# Patient Record
Sex: Male | Born: 1975
Health system: Southern US, Community
[De-identification: ages and names within clinical notes are randomized; demographics above are authoritative.]

## PROBLEM LIST (undated history)

## (undated) DIAGNOSIS — R197 Diarrhea, unspecified: Secondary | ICD-10-CM

## (undated) DIAGNOSIS — L0390 Cellulitis, unspecified: Secondary | ICD-10-CM

## (undated) DIAGNOSIS — C801 Malignant (primary) neoplasm, unspecified: Secondary | ICD-10-CM

## (undated) DIAGNOSIS — F329 Major depressive disorder, single episode, unspecified: Secondary | ICD-10-CM

## (undated) DIAGNOSIS — I10 Essential (primary) hypertension: Secondary | ICD-10-CM

## (undated) DIAGNOSIS — Z9852 Vasectomy status: Secondary | ICD-10-CM

## (undated) DIAGNOSIS — F4310 Post-traumatic stress disorder, unspecified: Secondary | ICD-10-CM

## (undated) DIAGNOSIS — Z85528 Personal history of other malignant neoplasm of kidney: Secondary | ICD-10-CM

## (undated) DIAGNOSIS — G8929 Other chronic pain: Secondary | ICD-10-CM

## (undated) DIAGNOSIS — B019 Varicella without complication: Secondary | ICD-10-CM

## (undated) DIAGNOSIS — C649 Malignant neoplasm of unspecified kidney, except renal pelvis: Secondary | ICD-10-CM

## (undated) DIAGNOSIS — F419 Anxiety disorder, unspecified: Secondary | ICD-10-CM

## (undated) DIAGNOSIS — J45909 Unspecified asthma, uncomplicated: Secondary | ICD-10-CM

## (undated) DIAGNOSIS — M1990 Unspecified osteoarthritis, unspecified site: Secondary | ICD-10-CM

## (undated) HISTORY — PX: WRIST SURGERY: SHX841

## (undated) HISTORY — DX: Varicella without complication: B01.9

## (undated) HISTORY — DX: Malignant neoplasm of unspecified kidney, except renal pelvis: C64.9

## (undated) HISTORY — DX: Unspecified asthma, uncomplicated: J45.909

## (undated) HISTORY — DX: Major depressive disorder, single episode, unspecified: F32.9

## (undated) HISTORY — PX: WISDOM TOOTH EXTRACTION: SHX21

## (undated) HISTORY — DX: Diarrhea, unspecified: R19.7

## (undated) HISTORY — PX: ABLATION: SHX5711

## (undated) HISTORY — DX: Vasectomy status: Z98.52

## (undated) HISTORY — DX: Anxiety disorder, unspecified: F41.9

## (undated) HISTORY — DX: Unspecified osteoarthritis, unspecified site: M19.90

---

## 2010-10-21 HISTORY — PX: VASECTOMY: SHX75

## 2012-10-16 ENCOUNTER — Encounter (HOSPITAL_BASED_OUTPATIENT_CLINIC_OR_DEPARTMENT_OTHER): Payer: Self-pay

## 2012-10-16 DIAGNOSIS — A4901 Methicillin susceptible Staphylococcus aureus infection, unspecified site: Secondary | ICD-10-CM | POA: Diagnosis present

## 2012-10-16 DIAGNOSIS — L02219 Cutaneous abscess of trunk, unspecified: Secondary | ICD-10-CM | POA: Diagnosis present

## 2012-10-16 DIAGNOSIS — L02419 Cutaneous abscess of limb, unspecified: Principal | ICD-10-CM | POA: Diagnosis present

## 2012-10-16 DIAGNOSIS — F17200 Nicotine dependence, unspecified, uncomplicated: Secondary | ICD-10-CM | POA: Diagnosis present

## 2012-10-16 DIAGNOSIS — Z23 Encounter for immunization: Secondary | ICD-10-CM

## 2012-10-16 DIAGNOSIS — Z792 Long term (current) use of antibiotics: Secondary | ICD-10-CM

## 2012-10-16 NOTE — ED Notes (Signed)
Left thigh abscess to left thigh with I&D 12/25-now c/o area to left groin that has enlarged

## 2012-10-17 ENCOUNTER — Encounter (HOSPITAL_COMMUNITY): Payer: Self-pay | Admitting: Family Medicine

## 2012-10-17 ENCOUNTER — Inpatient Hospital Stay (HOSPITAL_BASED_OUTPATIENT_CLINIC_OR_DEPARTMENT_OTHER)
Admission: EM | Admit: 2012-10-17 | Discharge: 2012-10-20 | DRG: 603 | Disposition: A | Discharge status: Home / Self Care | Payer: Medicaid Other | Attending: Family Medicine | Admitting: Family Medicine

## 2012-10-17 ENCOUNTER — Emergency Department (HOSPITAL_BASED_OUTPATIENT_CLINIC_OR_DEPARTMENT_OTHER): Payer: Medicaid Other

## 2012-10-17 DIAGNOSIS — L03319 Cellulitis of trunk, unspecified: Secondary | ICD-10-CM

## 2012-10-17 DIAGNOSIS — L0390 Cellulitis, unspecified: Secondary | ICD-10-CM

## 2012-10-17 DIAGNOSIS — L03116 Cellulitis of left lower limb: Secondary | ICD-10-CM

## 2012-10-17 DIAGNOSIS — L02219 Cutaneous abscess of trunk, unspecified: Secondary | ICD-10-CM

## 2012-10-17 DIAGNOSIS — L03119 Cellulitis of unspecified part of limb: Principal | ICD-10-CM

## 2012-10-17 DIAGNOSIS — L02419 Cutaneous abscess of limb, unspecified: Secondary | ICD-10-CM

## 2012-10-17 DIAGNOSIS — L03311 Cellulitis of abdominal wall: Secondary | ICD-10-CM

## 2012-10-17 LAB — CBC WITH DIFFERENTIAL/PLATELET
Basophils Absolute: 0 10*3/uL (ref 0.0–0.1)
Eosinophils Relative: 6 % — ABNORMAL HIGH (ref 0–5)
Lymphocytes Relative: 16 % (ref 12–46)
MCV: 93.3 fL (ref 78.0–100.0)
Platelets: 222 10*3/uL (ref 150–400)
RDW: 12.2 % (ref 11.5–15.5)
WBC: 9.6 10*3/uL (ref 4.0–10.5)

## 2012-10-17 LAB — BASIC METABOLIC PANEL
CO2: 29 mEq/L (ref 19–32)
Calcium: 9.7 mg/dL (ref 8.4–10.5)
GFR calc non Af Amer: 84 mL/min — ABNORMAL LOW (ref >90)
Sodium: 140 mEq/L (ref 135–145)

## 2012-10-17 MED ORDER — NICOTINE 14 MG/24HR TD PT24
14.0000 mg | MEDICATED_PATCH | Freq: Every day | TRANSDERMAL | Status: DC
  Administered 2012-10-17 – 2012-10-20 (×4): 14 mg via TRANSDERMAL
  Filled 2012-10-17 (×4): qty 1

## 2012-10-17 MED ORDER — SODIUM CHLORIDE 0.9 % IV SOLN
Freq: Once | INTRAVENOUS | Status: AC
  Administered 2012-10-17: 02:00:00 via INTRAVENOUS

## 2012-10-17 MED ORDER — VANCOMYCIN HCL 10 G IV SOLR
1250.0000 mg | Freq: Two times a day (BID) | INTRAVENOUS | Status: DC
  Administered 2012-10-17 (×2): 1250 mg via INTRAVENOUS
  Filled 2012-10-17 (×4): qty 1250

## 2012-10-17 MED ORDER — SODIUM CHLORIDE 0.9 % IV BOLUS (SEPSIS)
1000.0000 mL | Freq: Once | INTRAVENOUS | Status: AC
  Administered 2012-10-17: 1000 mL via INTRAVENOUS

## 2012-10-17 MED ORDER — KETOROLAC TROMETHAMINE 30 MG/ML IJ SOLN
30.0000 mg | Freq: Once | INTRAMUSCULAR | Status: AC
  Administered 2012-10-17: 30 mg via INTRAVENOUS
  Filled 2012-10-17: qty 1

## 2012-10-17 MED ORDER — ONDANSETRON HCL 4 MG PO TABS: 4.0000 mg | ORAL_TABLET | Freq: Four times a day (QID) | ORAL | Status: DC | PRN

## 2012-10-17 MED ORDER — MORPHINE SULFATE 4 MG/ML IJ SOLN
4.0000 mg | INTRAMUSCULAR | Status: DC | PRN
  Administered 2012-10-17 – 2012-10-20 (×16): 4 mg via INTRAVENOUS
  Filled 2012-10-17 (×16): qty 1

## 2012-10-17 MED ORDER — SODIUM CHLORIDE 0.9 % IV SOLN
3.0000 g | Freq: Four times a day (QID) | INTRAVENOUS | Status: DC
  Administered 2012-10-17 – 2012-10-20 (×12): 3 g via INTRAVENOUS
  Filled 2012-10-17 (×15): qty 3

## 2012-10-17 MED ORDER — DOCUSATE SODIUM 100 MG PO CAPS
100.0000 mg | ORAL_CAPSULE | Freq: Two times a day (BID) | ORAL | Status: DC
  Administered 2012-10-17 – 2012-10-20 (×7): 100 mg via ORAL
  Filled 2012-10-17 (×7): qty 1

## 2012-10-17 MED ORDER — ONDANSETRON HCL 4 MG/2ML IJ SOLN: 4.0000 mg | Freq: Four times a day (QID) | INTRAMUSCULAR | Status: DC | PRN

## 2012-10-17 MED ORDER — FENTANYL CITRATE 0.05 MG/ML IJ SOLN
50.0000 ug | Freq: Once | INTRAMUSCULAR | Status: AC
  Administered 2012-10-17: 50 ug via INTRAVENOUS
  Filled 2012-10-17: qty 2

## 2012-10-17 MED ORDER — PIPERACILLIN-TAZOBACTAM 3.375 G IVPB 30 MIN
3.3750 g | Freq: Once | INTRAVENOUS | Status: AC
  Administered 2012-10-17: 3.375 g via INTRAVENOUS
  Filled 2012-10-17 (×2): qty 50

## 2012-10-17 MED ORDER — ALUM & MAG HYDROXIDE-SIMETH 200-200-20 MG/5ML PO SUSP
30.0000 mL | Freq: Four times a day (QID) | ORAL | Status: DC | PRN
  Administered 2012-10-17 – 2012-10-19 (×3): 30 mL via ORAL
  Filled 2012-10-17 (×3): qty 30

## 2012-10-17 MED ORDER — ACETAMINOPHEN 650 MG RE SUPP: 650.0000 mg | Freq: Four times a day (QID) | RECTAL | Status: DC | PRN

## 2012-10-17 MED ORDER — POLYETHYLENE GLYCOL 3350 17 G PO PACK
17.0000 g | PACK | Freq: Every day | ORAL | Status: DC | PRN
  Administered 2012-10-20: 17 g via ORAL
  Filled 2012-10-17: qty 1

## 2012-10-17 MED ORDER — HYDROCODONE-ACETAMINOPHEN 5-325 MG PO TABS
1.0000 | ORAL_TABLET | ORAL | Status: DC | PRN
Start: 2012-10-17 — End: 2012-10-19
  Administered 2012-10-17 – 2012-10-19 (×10): 2 via ORAL
  Filled 2012-10-17 (×10): qty 2

## 2012-10-17 MED ORDER — ACETAMINOPHEN 325 MG PO TABS
650.0000 mg | ORAL_TABLET | Freq: Four times a day (QID) | ORAL | Status: DC | PRN
Start: 2012-10-17 — End: 2012-10-17

## 2012-10-17 MED ORDER — VANCOMYCIN HCL IN DEXTROSE 1-5 GM/200ML-% IV SOLN
1000.0000 mg | Freq: Once | INTRAVENOUS | Status: AC
  Administered 2012-10-17: 1000 mg via INTRAVENOUS
  Filled 2012-10-17: qty 200

## 2012-10-17 MED ORDER — SODIUM CHLORIDE 0.9 % IV SOLN
INTRAVENOUS | Status: DC
  Administered 2012-10-17 – 2012-10-18 (×2): via INTRAVENOUS
  Administered 2012-10-19: 1000 mL via INTRAVENOUS

## 2012-10-17 MED ORDER — ZOLPIDEM TARTRATE 5 MG PO TABS: 5.0000 mg | ORAL_TABLET | Freq: Every evening | ORAL | Status: DC | PRN

## 2012-10-17 MED ORDER — ACETAMINOPHEN 325 MG PO TABS: 650.0000 mg | ORAL_TABLET | Freq: Four times a day (QID) | ORAL | Status: DC | PRN

## 2012-10-17 MED ORDER — IOHEXOL 300 MG/ML  SOLN
80.0000 mL | Freq: Once | INTRAMUSCULAR | Status: AC | PRN
  Administered 2012-10-17: 80 mL via INTRAVENOUS

## 2012-10-17 MED ORDER — MORPHINE SULFATE 4 MG/ML IJ SOLN
4.0000 mg | Freq: Once | INTRAMUSCULAR | Status: AC
  Administered 2012-10-17: 4 mg via INTRAVENOUS
  Filled 2012-10-17: qty 1

## 2012-10-17 NOTE — ED Notes (Signed)
Patient requesting something else for pain, reports pain 8/10. Will notify MD.

## 2012-10-17 NOTE — Consult Note (Signed)
Reason for Consult: L thigh and suprapubic abscess Referring Physician: [facility]  [name] is an 36 y.o. male.  HPI: Pt is a 36 y/o M that about a week ago was seen at an OSH for L thigh abscess.  This was I&D with no pus expressed.  He was started on bactrim and cephalexin.  The area con't to be painful and an area in his supra pubic area began to enlarge with cellulitis.  He came to the ED for further eval.  He was started on IV abx, and CT of his pelvis and L thigh reveal cellulitis with no signs of abscess collection.  History reviewed. No pertinent past medical history.  Past Surgical History  Procedure Date  . Wrist surgery   . Vasectomy     Family History  Problem Relation Age of Onset  . Cirrhosis Father     Social History:  reports that he has been smoking.  He does not have any smokeless tobacco history on file. He reports that he does not drink alcohol or use illicit drugs.  Allergies:  Allergies  Allergen Reactions  . Bee Pollen Swelling    Medications: I have reviewed the patient's current medications.  Results for orders placed during the hospital encounter of 10/17/12 (from the past 48 hour(s))  CBC WITH DIFFERENTIAL     Status: Abnormal   Collection Time   10/17/12  1:30 AM      Component Value Range Comment   WBC 9.6  4.0 - 10.5 K/uL    RBC 4.49  4.22 - 5.81 MIL/uL    Hemoglobin 14.7  13.0 - 17.0 g/dL    HCT 40.9  81.1 - 91.4 %    MCV 93.3  78.0 - 100.0 fL    MCH 32.7  26.0 - 34.0 pg    MCHC 35.1  30.0 - 36.0 g/dL    RDW 78.2  95.6 - 21.3 %    Platelets 222  150 - 400 K/uL    Neutrophils Relative 70  43 - 77 %    Neutro Abs 6.7  1.7 - 7.7 K/uL    Lymphocytes Relative 16  12 - 46 %    Lymphs Abs 1.5  0.7 - 4.0 K/uL    Monocytes Relative 9  3 - 12 %    Monocytes Absolute 0.8  0.1 - 1.0 K/uL    Eosinophils Relative 6 (*) 0 - 5 %    Eosinophils Absolute 0.5  0.0 - 0.7 K/uL    Basophils Relative 0  0 - 1 %    Basophils Absolute 0.0  0.0 -  0.1 K/uL   BASIC METABOLIC PANEL     Status: Abnormal   Collection Time   10/17/12  1:30 AM      Component Value Range Comment   Sodium 140  135 - 145 mEq/L    Potassium 4.5  3.5 - 5.1 mEq/L    Chloride 101  96 - 112 mEq/L    CO2 29  19 - 32 mEq/L    Glucose, Bld 96  70 - 99 mg/dL    BUN 14  6 - 23 mg/dL    Creatinine, Ser 0.86  0.50 - 1.35 mg/dL    Calcium 9.7  8.4 - 57.8 mg/dL    GFR calc non Af Amer 84 (*) >90 mL/min    GFR calc Af Amer >90  >90 mL/min     Ct Pelvis W Contrast  10/17/2012  *RADIOLOGY REPORT*  Clinical Data:  Left thigh soft tissue swelling, extending to the left groin.  Concern for abscess.  CT PELVIS WITH CONTRAST  Technique:  Multidetector CT imaging of the pelvis was performed using the standard protocol following the bolus administration of intravenous contrast.  Contrast: 80mL OMNIPAQUE IOHEXOL 300 MG/ML  SOLN  Comparison:   None.  Findings:  There is no evidence for abscess.  Focal soft tissue swelling is noted along the anterior aspect of the proximal left thigh, compatible with cellulitis.  Underlying soft tissue inflammation is seen extending into the deeper soft tissues, without definite fascial involvement.  This is relatively localized, without evidence of extension to the groin or more inferiorly along the thigh, though very minimal soft tissue inflammation is seen extending distally along the anterior and medial left thigh.  Additional focal soft tissue inflammation is noted superior to the penile shaft, with associated cellulitis.  This has a relatively diffuse appearance, and there is mild soft tissue inflammation just above the penile shaft, without evidence of a focal fluid collection.  Scattered inguinal nodes remain normal in size.  There is mild nonspecific scrotal wall edema, possibly chronic in nature.  There is no evidence of vascular compromise.  The visualized small and large bowel loops are unremarkable in appearance.  The appendix is normal in  caliber, without evidence for appendicitis.  The bladder is mildly distended and grossly unremarkable.  The prostate remains normal in size, with minimal calcification.  No acute osseous abnormalities are identified.  IMPRESSION:  1.  No evidence for abscess. 2.  Two separate foci of relatively significant deep soft tissue inflammation.  One is seen at the anterior aspect of the proximal left thigh, without definite fascial involvement.  The second is noted superior to the penile shaft, with extension adjacent to the penile shaft, but no focal fluid collection. 3.  Minimal soft tissue inflammation noted extending distally along the anterior and medial left thigh; cellulitis involving the anterior proximal left thigh, and superior to the penile shaft. 4.  Apparent mild nonspecific scrotal wall edema may be chronic in nature.   Original Report Authenticated By: Tonia Ghent, M.D.     Review of Systems  Constitutional: Negative.   HENT: Negative.   Respiratory: Negative.   Cardiovascular: Negative.   Gastrointestinal: Negative.   Musculoskeletal: Negative.   Skin: Negative.    Blood pressure 130/74, pulse 86, temperature 97.7 F (36.5 C), temperature source Oral, resp. rate 18, height 6\' 1"  (1.854 m), weight 269 lb 2.9 oz (122.1 kg), SpO2 100.00%. Physical Exam  Constitutional: He is oriented to person, place, and time. He appears well-developed and well-nourished.  HENT:  Head: Normocephalic and atraumatic.  Eyes: Conjunctivae normal are normal. Pupils are equal, round, and reactive to light.  Neck: Normal range of motion. Neck supple.  Cardiovascular: Normal rate and normal heart sounds.   Respiratory: Effort normal.  GI: Soft. Bowel sounds are normal.  Genitourinary:     Musculoskeletal: Normal range of motion.  Neurological: He is alert and oriented to person, place, and time.    Assessment/Plan: 36 y/o M with suprapubic cellulits and L thigh cellulitis.    At this time there is  no collection of pus that would benefit from and I&D.  I agree with IV abx choice at this time.  We will con't to monitor cellulitis sites should an abscess form that would benefit from drainage.  Pt ok to eat from my standpoint today.  Marigene Ehlers., Corneluis Allston 10/17/2012, 8:25  AM

## 2012-10-17 NOTE — H&P (Signed)
History and Physical  Naseer Hearn ZOX:[id number] DOB: 09/01/1976 DOA: 10/17/2012  Referring physician: April Smitty Cords, MD PCP: No primary provider on file.   Chief Complaint: leg pain  HPI:  36 year old man with erythema and swelling of left leg and lower abdomen for 6 days, presented to [facility] and was transferred to [facility] for IV antibiotics and further evaluation.  12/23 patient noticed a "bump" left thigh which he thought was a chigger bite and he covered with nail polish. However pain, erythema and swelling increased and 12/25 presented to [facility] emergency department where I&D was performed. Patient was started on Bactrim and Keflex however left thigh lesion continued to drain and he developed a second lesion lower abdomen. He was seen at [facility] 12/27 where CAT scan revealed inflammation but no definite abscess. Given failure to improve on oral antibiotics he was transferred to [facility]. According to the EDP note from [facility] culture report from 12/25 [facility] was reviewed revealing sensitivity to Bactrim and Keflex. This report is not currently available.  No previous history of infection. Complains of intense pain lower abdomen which has been worsening over the last several days and has not been helped by antibiotics. No alleviating factors or aggravating factors noted except for touch.  Emergency department noted be afebrile with stable vitals. CBC and basic metabolic panel unremarkable. CT of the abdomen and pelvis showed no evidence of abscess.  Review of Systems:  Positive for subjective fever, muscle aches related to physical activity for new job, increased stress and chest discomfort related to stress and job.  Negative for changes to her vision, sore throat, rash except as above, shortness of breath, dysuria, bleeding, nausea, vomiting, diarrhea.  History reviewed. No pertinent past medical history. Denies medical  problems.  Past Surgical History  Procedure Date  . Wrist surgery   . Vasectomy     Social History:  reports that he has been smoking.  He does not have any smokeless tobacco history on file. He reports that he does not drink alcohol or use illicit drugs.  Allergies  Allergen Reactions  . Bee Pollen Swelling    Family History  Problem Relation Age of Onset  . Cirrhosis Father      Prior to Admission medications   Medication Sig Start Date End Date Taking? Authorizing Provider  cephALEXin (KEFLEX) 500 MG capsule Take 500 mg by mouth 4 (four) times daily.   Yes Historical Provider, MD  sulfamethoxazole-trimethoprim (BACTRIM DS) 800-160 MG per tablet Take 1 tablet by mouth 2 (two) times daily.   Yes Historical Provider, MD   Physical Exam: Filed Vitals:   10/16/12 2341 10/17/12 0532 10/17/12 0600  BP: 149/88 143/85 130/74  Pulse: 95 88 86  Temp: 98.3 F (36.8 C)  97.7 F (36.5 C)  TempSrc: Oral  Oral  Resp: 16 18 18   Height: 6\' 1"  (1.854 m)  6\' 1"  (1.854 m)  Weight: 811.9 kg (278 lb)  122.1 kg (269 lb 2.9 oz)  SpO2: 100% 100% 100%    General:  Appears calm and comfortable Eyes: Pupils, irises, lids appear normal. ENT: grossly normal hearing, lips & tongue Neck: no LAD, masses or thyromegaly Cardiovascular: RRR, no m/r/g. No LE edema. Respiratory: CTA bilaterally, no w/r/r. Normal respiratory effort. Abdomen: soft, nondistended. Exquisite lower abdominal pain with palpation of induration and erythema. Skin: Oval area of induration exquisitely tender lower abdomen with erythema, possible fluid collection. This is superior to the  shaft of the penis. Second area of induration with eschar left anterior thigh which is moderately tender to palpation with some surrounding edema and erythema with some induration. GU: Circumcised penis appears normal, nontender. Scrotum appears to have some edema, but is nontender. Testicles appear unremarkable. Perineum appears normal without  erythema. Musculoskeletal: grossly normal tone BUE/BLE Psychiatric: grossly normal mood and affect, speech fluent and appropriate Neurologic: grossly non-focal.  Wt Readings from Last 3 Encounters:  10/17/12 122.1 kg (269 lb 2.9 oz)    Labs on Admission:  Basic Metabolic Panel:  Lab 10/17/12 1610  NA 140  K 4.5  CL 101  CO2 29  GLUCOSE 96  BUN 14  CREATININE 1.10  CALCIUM 9.7  MG --  PHOS --     CBC:  Lab 10/17/12 0130  WBC 9.6  NEUTROABS 6.7  HGB 14.7  HCT 41.9  MCV 93.3  PLT 222     Radiological Exams on Admission: Ct Pelvis W Contrast  10/17/2012  *RADIOLOGY REPORT*  Clinical Data:  Left thigh soft tissue swelling, extending to the left groin.  Concern for abscess.  CT PELVIS WITH CONTRAST  Technique:  Multidetector CT imaging of the pelvis was performed using the standard protocol following the bolus administration of intravenous contrast.  Contrast: 80mL OMNIPAQUE IOHEXOL 300 MG/ML  SOLN  Comparison:   None.  Findings:  There is no evidence for abscess.  Focal soft tissue swelling is noted along the anterior aspect of the proximal left thigh, compatible with cellulitis.  Underlying soft tissue inflammation is seen extending into the deeper soft tissues, without definite fascial involvement.  This is relatively localized, without evidence of extension to the groin or more inferiorly along the thigh, though very minimal soft tissue inflammation is seen extending distally along the anterior and medial left thigh.  Additional focal soft tissue inflammation is noted superior to the penile shaft, with associated cellulitis.  This has a relatively diffuse appearance, and there is mild soft tissue inflammation just above the penile shaft, without evidence of a focal fluid collection.  Scattered inguinal nodes remain normal in size.  There is mild nonspecific scrotal wall edema, possibly chronic in nature.  There is no evidence of vascular compromise.  The visualized small and  large bowel loops are unremarkable in appearance.  The appendix is normal in caliber, without evidence for appendicitis.  The bladder is mildly distended and grossly unremarkable.  The prostate remains normal in size, with minimal calcification.  No acute osseous abnormalities are identified.  IMPRESSION:  1.  No evidence for abscess. 2.  Two separate foci of relatively significant deep soft tissue inflammation.  One is seen at the anterior aspect of the proximal left thigh, without definite fascial involvement.  The second is noted superior to the penile shaft, with extension adjacent to the penile shaft, but no focal fluid collection. 3.  Minimal soft tissue inflammation noted extending distally along the anterior and medial left thigh; cellulitis involving the anterior proximal left thigh, and superior to the penile shaft. 4.  Apparent mild nonspecific scrotal wall edema may be chronic in nature.   Original Report Authenticated By: Tonia Ghent, M.D.     Principal Problem:  *Cellulitis of left thigh Active Problems:  Abdominal wall cellulitis   Assessment/Plan 1. Cellulitis left thigh, lower abdomen--suspected abscess developing. Surgery consult placed. By report, was sensitive to Bactrim and Keflex. Obtain culture results from 12/25 incision and drainage at [facility]. No reason to suspect Pseudomonas. Narrow coverage  to Unasyn pending old records.  Marked area  Code Status: Full code Family Communication: None present Disposition Plan/Anticipated LOS: 2 days. Home when improved.  Time spent: 50 minutes  Brendia Sacks, MD  [facility] Team 4  Pager [phone] If 7PM-7AM, please contact night-coverage at [email or web address], password [facility] 10/17/2012, 8:05 AM

## 2012-10-17 NOTE — ED Notes (Signed)
Patient requesting something for pain. Will notify MD.

## 2012-10-17 NOTE — Progress Notes (Signed)
ANTIBIOTIC CONSULT NOTE - INITIAL  Pharmacy Consult for vancomycin and unasyn Indication: L thigh and abdominal wall cellulitis  Allergies  Allergen Reactions  . Bee Pollen Swelling    Patient Measurements: Height: 6\' 1"  (185.4 cm) Weight: 269 lb 2.9 oz (122.1 kg) IBW/kg (Calculated) : 79.9   Vital Signs: Temp: 97.7 F (36.5 C) (12/28 0600) Temp src: Oral (12/28 0600) BP: 130/74 mmHg (12/28 0600) Pulse Rate: 86  (12/28 0600) Intake/Output from previous day:   Intake/Output from this shift:    Labs:  Basename 10/17/12 0130  WBC 9.6  HGB 14.7  PLT 222  LABCREA --  CREATININE 1.10   Estimated Creatinine Clearance: 125.9 ml/min (by C-G formula based on Cr of 1.1). No results found for this basename: VANCOTROUGH:2,VANCOPEAK:2,VANCORANDOM:2,GENTTROUGH:2,GENTPEAK:2,GENTRANDOM:2,TOBRATROUGH:2,TOBRAPEAK:2,TOBRARND:2,AMIKACINPEAK:2,AMIKACINTROU:2,AMIKACIN:2, in the last 72 hours   Microbiology: No results found for this or any previous visit (from the past 720 hour(s)).  Medical History: History reviewed. No pertinent past medical history.  Medications:  Prescriptions prior to admission  Medication Sig Dispense Refill  . cephALEXin (KEFLEX) 500 MG capsule Take 500 mg by mouth 4 (four) times daily.      Marland Kitchen sulfamethoxazole-trimethoprim (BACTRIM DS) 800-160 MG per tablet Take 1 tablet by mouth 2 (two) times daily.       Assessment: 36 yo M with 1wk h/o L thigh abscess.  I&D at another facility, failed outpt bactrim and cephalexin.  Admitted for IV abx, CT revealed no signs of abscess collection.  Received vancomcyin 1g IV x1 in ED at 0405 and Zosyn 3.375g IV x1 at 0513.  Goal of Therapy:  Vancomycin trough level ~15 mcg/ml  Plan:  - Vancomycin 1250 mg IV q12h  (Will start at 1000 to total loading dose of 2250 mg for this am.) - Unasyn 3g IV q6h - Follow up SCr, UOP, cultures, clinical course and adjust as clinically indicated.  Logyn Dedominicis L. Illene Bolus, PharmD,  BCPS Clinical Pharmacist Pager: [phone] Pharmacy: [phone] 10/17/2012 9:21 AM

## 2012-10-17 NOTE — ED Provider Notes (Signed)
History     CSN: [id number]  Arrival date & time 10/16/12  2319   First MD Initiated Contact with Patient 10/17/12 0022      Chief Complaint  Patient presents with  . Abscess    (Consider location/radiation/quality/duration/timing/severity/associated sxs/prior treatment) Patient is a 36 y.o. male presenting with abscess. The history is provided by the patient.  Abscess  This is a new problem. The current episode started more than one week ago. The onset was gradual. The problem occurs continuously. The problem has been gradually worsening. Affected Location: left thigh and area proximal to the penis. The problem is severe. The abscess is characterized by painfulness, redness and swelling. Associated with: none. The abscess first occurred at home. Pertinent negatives include no fever. There were no sick contacts. Recently, medical care has been given at another facility. Services received include medications given and tests performed.    History reviewed. No pertinent past medical history.  Past Surgical History  Procedure Date  . Wrist surgery   . Vasectomy     No family history on file.  History  Substance Use Topics  . Smoking status: Current Every Day Smoker  . Smokeless tobacco: Not on file  . Alcohol Use: No      Review of Systems  Constitutional: Negative for fever.  Skin: Positive for color change and wound.  All other systems reviewed and are negative.    Allergies  Bee pollen  Home Medications   Current Outpatient Rx  Name  Route  Sig  Dispense  Refill  . CEPHALEXIN 500 MG PO CAPS   Oral   Take 500 mg by mouth 4 (four) times daily.         . SULFAMETHOXAZOLE-TRIMETHOPRIM 800-160 MG PO TABS   Oral   Take 1 tablet by mouth 2 (two) times daily.           BP 149/88  Pulse 95  Temp 98.3 F (36.8 C) (Oral)  Resp 16  Ht 6\' 1"  (1.854 m)  Wt 278 lb (126.1 kg)  BMI 36.68 kg/m2  SpO2 100%  Physical Exam  Constitutional: He is oriented to  person, place, and time. He appears well-developed and well-nourished. No distress.  HENT:  Head: Normocephalic and atraumatic.  Mouth/Throat: Oropharynx is clear and moist.  Eyes: Conjunctivae normal are normal. Pupils are equal, round, and reactive to light.  Neck: Normal range of motion. Neck supple.  Cardiovascular: Normal rate and regular rhythm.   Pulmonary/Chest: Effort normal and breath sounds normal. He has no wheezes. He has no rales.  Abdominal: Soft. Bowel sounds are normal. There is no tenderness. There is no rebound and no guarding.  Genitourinary: Penis normal.  Musculoskeletal: Normal range of motion.  Neurological: He is alert and oriented to person, place, and time.  Skin: Skin is warm and dry. There is erythema.     Psychiatric: He has a normal mood and affect.    ED Course  Procedures (including critical care time)  Labs Reviewed  CBC WITH DIFFERENTIAL - Abnormal; Notable for the following:    Eosinophils Relative 6 (*)     All other components within normal limits  BASIC METABOLIC PANEL - Abnormal; Notable for the following:    GFR calc non Af Amer 84 (*)     All other components within normal limits  CULTURE, BLOOD (ROUTINE X 2)  CULTURE, BLOOD (ROUTINE X 2)   Ct Pelvis W Contrast  10/17/2012  *RADIOLOGY REPORT*  Clinical Data:  Left thigh soft tissue swelling, extending to the left groin.  Concern for abscess.  CT PELVIS WITH CONTRAST  Technique:  Multidetector CT imaging of the pelvis was performed using the standard protocol following the bolus administration of intravenous contrast.  Contrast: 80mL OMNIPAQUE IOHEXOL 300 MG/ML  SOLN  Comparison:   None.  Findings:  There is no evidence for abscess.  Focal soft tissue swelling is noted along the anterior aspect of the proximal left thigh, compatible with cellulitis.  Underlying soft tissue inflammation is seen extending into the deeper soft tissues, without definite fascial involvement.  This is relatively  localized, without evidence of extension to the groin or more inferiorly along the thigh, though very minimal soft tissue inflammation is seen extending distally along the anterior and medial left thigh.  Additional focal soft tissue inflammation is noted superior to the penile shaft, with associated cellulitis.  This has a relatively diffuse appearance, and there is mild soft tissue inflammation just above the penile shaft, without evidence of a focal fluid collection.  Scattered inguinal nodes remain normal in size.  There is mild nonspecific scrotal wall edema, possibly chronic in nature.  There is no evidence of vascular compromise.  The visualized small and large bowel loops are unremarkable in appearance.  The appendix is normal in caliber, without evidence for appendicitis.  The bladder is mildly distended and grossly unremarkable.  The prostate remains normal in size, with minimal calcification.  No acute osseous abnormalities are identified.  IMPRESSION:  1.  No evidence for abscess. 2.  Two separate foci of relatively significant deep soft tissue inflammation.  One is seen at the anterior aspect of the proximal left thigh, without definite fascial involvement.  The second is noted superior to the penile shaft, with extension adjacent to the penile shaft, but no focal fluid collection. 3.  Minimal soft tissue inflammation noted extending distally along the anterior and medial left thigh; cellulitis involving the anterior proximal left thigh, and superior to the penile shaft. 4.  Apparent mild nonspecific scrotal wall edema may be chronic in nature.   Original Report Authenticated By: Tonia Ghent, M.D.      1. Cellulitis       MDM  According to records sent from [facility] abscess is susceptible to bactrim but has cellulitis has continued to spread on keflex and bactrim.  Will require admission for IV antibiotics.         Jasmine Awe, MD 10/17/12 [phone]

## 2012-10-17 NOTE — ED Notes (Signed)
Patient transported to CT 

## 2012-10-18 LAB — CBC
HCT: 40.4 % (ref 39.0–52.0)
MCV: 93.3 fL (ref 78.0–100.0)
RDW: 12.3 % (ref 11.5–15.5)
WBC: 5.7 10*3/uL (ref 4.0–10.5)

## 2012-10-18 MED ORDER — INFLUENZA VIRUS VACC SPLIT PF IM SUSP
0.5000 mL | INTRAMUSCULAR | Status: AC
  Administered 2012-10-19: 0.5 mL via INTRAMUSCULAR
  Filled 2012-10-18: qty 0.5

## 2012-10-18 MED ORDER — PNEUMOCOCCAL 13-VAL CONJ VACC IM SUSP
0.5000 mL | INTRAMUSCULAR | Status: DC
  Filled 2012-10-18: qty 0.5

## 2012-10-18 NOTE — Progress Notes (Signed)
[facility] PROGRESS NOTE  Reo Portela NWG:[id number] DOB: [birth year] DOA: 10/17/2012 PCP: No primary provider on file.  Assessment/Plan: 1. Cellulitis left thigh, lower abdomen--no significant improvement, however remains afebrile with normal white blood cell count. Appreciate surgical consultation, and may require incision and drainage. Culture report from incision and drainage at [facility] reviewed: Staph aureus, sensitive to oxacillin, clindamycin, Bactrim, vancomycin. Anaerobic report pending. Based on this information will discontinue vancomycin.  Code Status: Full code  Family Communication: None present  Disposition Plan/Anticipated LOS: 2 days. Home when improved.   Brendia Sacks, MD  [facility] Team 4  Pager [phone] If 7PM-7AM, please contact night-coverage at [email or web address], password [facility] 10/18/2012, 8:38 AM  LOS: 1 day   Brief narrative: 36 year old man with erythema and swelling of left leg and lower abdomen for 6 days, presented to med [facility] and was transferred to [facility] for IV antibiotics and further evaluation.  Consultants:  General surgery  Procedures:    Antibiotics:  Unasyn 12/28 >>  Vancomycin 12/28 >> 12/29  HPI/Subjective: Left thigh feels about the same, perhaps more painful and definitely more swollen. Lower abdominal area has increased in pain. Eating well.  Objective: Filed Vitals:   10/17/12 1424 10/17/12 1800 10/17/12 2119 10/18/12 0529  BP: 148/80 125/79 132/85 124/77  Pulse: 86 87 88 69  Temp: 97.4 F (36.3 C) 97.9 F (36.6 C) 97.8 F (36.6 C) 97.5 F (36.4 C)  TempSrc: Oral Oral Oral Oral  Resp: 18 18 20 20   Height:      Weight:      SpO2: 100% 98% 97% 98%    Intake/Output Summary (Last 24 hours) at 10/18/12 0838 Last data filed at 10/17/12 1821  Gross per 24 hour  Intake 2469.17 ml  Output      3 ml  Net 2466.17 ml   Filed Weights   10/16/12 2341 10/17/12 0600  Weight: 126.1 kg  (278 lb) 122.1 kg (269 lb 2.9 oz)    Exam:   General:  Appears calm and comfortable.  Cardiovascular: Regular rate and rhythm. No murmur, rub, gallop.  Respiratory: Clear to auscultation bilaterally. No wheezes, rales, rhonchi. Normal respiratory effort.  Abdomen: Lower abdominal induration and erythema appears unchanged. Very tender to palpation.  Skin: Left thigh area of induration and erythema is less pronounced today and flatter. Left thigh appears to have increased swelling without extension of erythema.  Data Reviewed: Basic Metabolic Panel:  Lab 10/17/12 2952  NA 140  K 4.5  CL 101  CO2 29  GLUCOSE 96  BUN 14  CREATININE 1.10  CALCIUM 9.7  MG --  PHOS --   CBC:  Lab 10/18/12 0705 10/17/12 0130  WBC 5.7 9.6  NEUTROABS -- 6.7  HGB 13.2 14.7  HCT 40.4 41.9  MCV 93.3 93.3  PLT 226 222   Studies: Ct Pelvis W Contrast  10/17/2012  *RADIOLOGY REPORT*  Clinical Data:  Left thigh soft tissue swelling, extending to the left groin.  Concern for abscess.  CT PELVIS WITH CONTRAST  Technique:  Multidetector CT imaging of the pelvis was performed using the standard protocol following the bolus administration of intravenous contrast.  Contrast: 80mL OMNIPAQUE IOHEXOL 300 MG/ML  SOLN  Comparison:   None.  Findings:  There is no evidence for abscess.  Focal soft tissue swelling is noted along the anterior aspect of the proximal left thigh, compatible with cellulitis.  Underlying soft tissue inflammation is seen extending into the deeper soft tissues, without definite  fascial involvement.  This is relatively localized, without evidence of extension to the groin or more inferiorly along the thigh, though very minimal soft tissue inflammation is seen extending distally along the anterior and medial left thigh.  Additional focal soft tissue inflammation is noted superior to the penile shaft, with associated cellulitis.  This has a relatively diffuse appearance, and there is mild soft  tissue inflammation just above the penile shaft, without evidence of a focal fluid collection.  Scattered inguinal nodes remain normal in size.  There is mild nonspecific scrotal wall edema, possibly chronic in nature.  There is no evidence of vascular compromise.  The visualized small and large bowel loops are unremarkable in appearance.  The appendix is normal in caliber, without evidence for appendicitis.  The bladder is mildly distended and grossly unremarkable.  The prostate remains normal in size, with minimal calcification.  No acute osseous abnormalities are identified.  IMPRESSION:  1.  No evidence for abscess. 2.  Two separate foci of relatively significant deep soft tissue inflammation.  One is seen at the anterior aspect of the proximal left thigh, without definite fascial involvement.  The second is noted superior to the penile shaft, with extension adjacent to the penile shaft, but no focal fluid collection. 3.  Minimal soft tissue inflammation noted extending distally along the anterior and medial left thigh; cellulitis involving the anterior proximal left thigh, and superior to the penile shaft. 4.  Apparent mild nonspecific scrotal wall edema may be chronic in nature.   Original Report Authenticated By: Tonia Ghent, M.D.     Scheduled Meds:   . ampicillin-sulbactam (UNASYN) IV  3 g Intravenous Q6H  . docusate sodium  100 mg Oral BID  . nicotine  14 mg Transdermal Daily  . vancomycin  1,250 mg Intravenous Q12H   Continuous Infusions:   . sodium chloride 125 mL/hr at 10/18/12 0510    Principal Problem:  *Cellulitis of left thigh Active Problems:  Abdominal wall cellulitis     Brendia Sacks, MD  [facility] Team 4  Pager [phone] If 7PM-7AM, please contact night-coverage at [email or web address], password [facility] 10/18/2012, 8:38 AM  LOS: 1 day   Time spent: 15 minutes

## 2012-10-18 NOTE — Progress Notes (Signed)
Subjective: He reports increased discomfort in left thigh Suprapubic area remains the same  Objective: Vital signs in last 24 hours: Temp:  [97.4 F (36.3 C)-97.9 F (36.6 C)] 97.5 F (36.4 C) (12/29 0529) Pulse Rate:  [69-88] 69  (12/29 0529) Resp:  [18-20] 20  (12/29 0529) BP: (124-148)/(77-85) 124/77 mmHg (12/29 0529) SpO2:  [97 %-100 %] 98 % (12/29 0529) Last BM Date: 10/16/12  Intake/Output from previous day: 12/28 0701 - 12/29 0700 In: 2469.2 [P.O.:840; I.V.:1179.2; IV Piggyback:450] Out: 3 [Urine:3] Intake/Output this shift:   Left thigh is actually soft with only mild induration Suprapubic area still with intense induration but no fluctuance  Lab Results:   Basename 10/17/12 0130  WBC 9.6  HGB 14.7  HCT 41.9  PLT 222   BMET  Basename 10/17/12 0130  NA 140  K 4.5  CL 101  CO2 29  GLUCOSE 96  BUN 14  CREATININE 1.10  CALCIUM 9.7   PT/INR No results found for this basename: LABPROT:2,INR:2 in the last 72 hours ABG No results found for this basename: PHART:2,PCO2:2,PO2:2,HCO3:2 in the last 72 hours  Studies/Results: Ct Pelvis W Contrast  10/17/2012  *RADIOLOGY REPORT*  Clinical Data:  Left thigh soft tissue swelling, extending to the left groin.  Concern for abscess.  CT PELVIS WITH CONTRAST  Technique:  Multidetector CT imaging of the pelvis was performed using the standard protocol following the bolus administration of intravenous contrast.  Contrast: 80mL OMNIPAQUE IOHEXOL 300 MG/ML  SOLN  Comparison:   None.  Findings:  There is no evidence for abscess.  Focal soft tissue swelling is noted along the anterior aspect of the proximal left thigh, compatible with cellulitis.  Underlying soft tissue inflammation is seen extending into the deeper soft tissues, without definite fascial involvement.  This is relatively localized, without evidence of extension to the groin or more inferiorly along the thigh, though very minimal soft tissue inflammation is seen  extending distally along the anterior and medial left thigh.  Additional focal soft tissue inflammation is noted superior to the penile shaft, with associated cellulitis.  This has a relatively diffuse appearance, and there is mild soft tissue inflammation just above the penile shaft, without evidence of a focal fluid collection.  Scattered inguinal nodes remain normal in size.  There is mild nonspecific scrotal wall edema, possibly chronic in nature.  There is no evidence of vascular compromise.  The visualized small and large bowel loops are unremarkable in appearance.  The appendix is normal in caliber, without evidence for appendicitis.  The bladder is mildly distended and grossly unremarkable.  The prostate remains normal in size, with minimal calcification.  No acute osseous abnormalities are identified.  IMPRESSION:  1.  No evidence for abscess. 2.  Two separate foci of relatively significant deep soft tissue inflammation.  One is seen at the anterior aspect of the proximal left thigh, without definite fascial involvement.  The second is noted superior to the penile shaft, with extension adjacent to the penile shaft, but no focal fluid collection. 3.  Minimal soft tissue inflammation noted extending distally along the anterior and medial left thigh; cellulitis involving the anterior proximal left thigh, and superior to the penile shaft. 4.  Apparent mild nonspecific scrotal wall edema may be chronic in nature.   Original Report Authenticated By: Tonia Ghent, M.D.     Anti-infectives: Anti-infectives     Start     Dose/Rate Route Frequency Ordered Stop   10/17/12 1200   Ampicillin-Sulbactam (UNASYN)  3 g in sodium chloride 0.9 % 100 mL IVPB        3 g 100 mL/hr over 60 Minutes Intravenous Every 6 hours 10/17/12 0936     10/17/12 1000   vancomycin (VANCOCIN) 1,250 mg in sodium chloride 0.9 % 250 mL IVPB        1,250 mg 166.7 mL/hr over 90 Minutes Intravenous Every 12 hours 10/17/12 0936      10/17/12 0400   vancomycin (VANCOCIN) IVPB 1000 mg/200 mL premix        1,000 mg 200 mL/hr over 60 Minutes Intravenous  Once 10/17/12 0353 10/17/12 0505   10/17/12 0400   piperacillin-tazobactam (ZOSYN) IVPB 3.375 g        3.375 g 100 mL/hr over 30 Minutes Intravenous  Once 10/17/12 0353 10/17/12 0543          Assessment/Plan: s/p * No surgery found *  No plans for I and D of thigh based on exam Suprapubic area, however, may need I and D in next 24 to 48 hours if not improving  LOS: 1 day    Theador Jezewski A 10/18/2012

## 2012-10-19 LAB — BASIC METABOLIC PANEL
BUN: 13 mg/dL (ref 6–23)
CO2: 31 mEq/L (ref 19–32)
Chloride: 100 mEq/L (ref 96–112)
Creatinine, Ser: 1.15 mg/dL (ref 0.50–1.35)
GFR calc Af Amer: >90 mL/min (ref >90)
Glucose, Bld: 100 mg/dL — ABNORMAL HIGH (ref 70–99)

## 2012-10-19 MED ORDER — ACETAMINOPHEN 10 MG/ML IV SOLN
1000.0000 mg | Freq: Four times a day (QID) | INTRAVENOUS | Status: DC
  Filled 2012-10-19 (×2): qty 100

## 2012-10-19 MED ORDER — OXYCODONE-ACETAMINOPHEN 5-325 MG PO TABS
2.0000 | ORAL_TABLET | Freq: Four times a day (QID) | ORAL | Status: DC | PRN
  Administered 2012-10-19 – 2012-10-20 (×5): 2 via ORAL
  Filled 2012-10-19 (×5): qty 2

## 2012-10-19 MED ORDER — BACITRACIN ZINC 500 UNIT/GM EX OINT
TOPICAL_OINTMENT | Freq: Two times a day (BID) | CUTANEOUS | Status: DC
  Administered 2012-10-19 – 2012-10-20 (×3): via TOPICAL
  Filled 2012-10-19: qty 15

## 2012-10-19 MED ORDER — IBUPROFEN 800 MG PO TABS
800.0000 mg | ORAL_TABLET | Freq: Four times a day (QID) | ORAL | Status: DC
  Administered 2012-10-19 – 2012-10-20 (×6): 800 mg via ORAL
  Filled 2012-10-19 (×8): qty 1

## 2012-10-19 MED ORDER — PNEUMOCOCCAL VAC POLYVALENT 25 MCG/0.5ML IJ INJ
0.5000 mL | INJECTION | INTRAMUSCULAR | Status: AC
  Administered 2012-10-19: 0.5 mL via INTRAMUSCULAR
  Filled 2012-10-19: qty 0.5

## 2012-10-19 NOTE — Progress Notes (Signed)
  Subjective: Still with c/o of pain "increasing" per patient in suprapubic region.  Objective: Vital signs in last 24 hours: Temp:  [97.3 F (36.3 C)-97.7 F (36.5 C)] 97.3 F (36.3 C) (12/30 0533) Pulse Rate:  [78-99] 78  (12/30 0533) Resp:  [20] 20  (12/30 0533) BP: (131-149)/(77-98) 149/98 mmHg (12/30 0533) SpO2:  [99 %] 99 % (12/30 0533) Last BM Date: 10/18/12  Intake/Output from previous day: 12/29 0701 - 12/30 0700 In: 100 [P.O.:100] Out: -  Intake/Output this shift:    General appearance: alert, cooperative, appears stated age and no distress Left thigh is actually soft with only mild induration, no drainage from opened area. Suprapubic area still with intense induration but no fluctuance. Measures approx 9cm x 5cm Patient c/o of pain gioing into scotum as well.  On exam scrotum is soft, w/o any s/s of extension of cellulitis. VSS, afebrile  Labs: WBC wnl on Unasyn. BC were negative.  Lab Results:   Basename 10/18/12 0705 10/17/12 0130  WBC 5.7 9.6  HGB 13.2 14.7  HCT 40.4 41.9  PLT 226 222   BMET  Basename 10/17/12 0130  NA 140  K 4.5  CL 101  CO2 29  GLUCOSE 96  BUN 14  CREATININE 1.10  CALCIUM 9.7   PT/INR No results found for this basename: LABPROT:2,INR:2 in the last 72 hours ABG No results found for this basename: PHART:2,PCO2:2,PO2:2,HCO3:2 in the last 72 hours  Studies/Results: No results found.  Anti-infectives: Anti-infectives     Start     Dose/Rate Route Frequency Ordered Stop   10/17/12 1200   Ampicillin-Sulbactam (UNASYN) 3 g in sodium chloride 0.9 % 100 mL IVPB        3 g 100 mL/hr over 60 Minutes Intravenous Every 6 hours 10/17/12 0936     10/17/12 1000   vancomycin (VANCOCIN) 1,250 mg in sodium chloride 0.9 % 250 mL IVPB  Status:  Discontinued        1,250 mg 166.7 mL/hr over 90 Minutes Intravenous Every 12 hours 10/17/12 0936 10/18/12 0937   10/17/12 0400   vancomycin (VANCOCIN) IVPB 1000 mg/200 mL premix        1,000  mg 200 mL/hr over 60 Minutes Intravenous  Once 10/17/12 0353 10/17/12 0505   10/17/12 0400  piperacillin-tazobactam (ZOSYN) IVPB 3.375 g       3.375 g 100 mL/hr over 30 Minutes Intravenous  Once 10/17/12 0353 10/17/12 0543          Assessment/Plan:  Patient Active Problem List  Diagnosis  . Cellulitis of left thigh  . Abdominal wall cellulitis   s/p * No surgery found * Plan: 1. Continue ABX 2. Add warm compresses qid to area on left thigh and suprapubic region 3. ? Need for I&D of suprapubic region (will discuss with Dr. Gerrit Friends) CT was negative for any fluid collection.  LOS: 2 days    Golda Acre [facility] Surgery Pager # [phone] 10/19/2012

## 2012-10-19 NOTE — Progress Notes (Signed)
[facility] PROGRESS NOTE  [name] WJX:[id number] DOB: December 31, 1975 DOA: 10/17/2012 PCP: No primary provider on file.  Assessment/Plan: 1. Cellulitis left thigh, lower abdomen--thigh improving, abdominal wound appears somewhat better but still quite painful. Discussed with surgery this a.m, and we will continue to observe.. Culture report from incision and drainage at [facility] reviewed: Staph aureus, sensitive to oxacillin, clindamycin, Bactrim, vancomycin. Anaerobic report pending.   Code Status: Full code  Family Communication: None present  Disposition Plan/Anticipated LOS: Home when improved.  Brendia Sacks, MD  [facility] Team 4  Pager [phone] If 7PM-7AM, please contact night-coverage at [email or web address], password [facility] 10/19/2012, 9:44 AM  LOS: 2 days   Brief narrative: 36 year old man with erythema and swelling of left leg and lower abdomen for 6 days, presented to med [facility] and was transferred to [facility] for IV antibiotics and further evaluation.  Consultants:  General surgery  Procedures:    Antibiotics:  Unasyn 12/28 >>  Vancomycin 12/28 >> 12/29  HPI/Subjective: Left thigh wound improving. Less painful. Abdominal wound very tender and painful.  Objective: Filed Vitals:   10/18/12 0529 10/18/12 1630 10/18/12 2114 10/19/12 0533  BP: 124/77 146/93 131/77 149/98  Pulse: 69 99 94 78  Temp: 97.5 F (36.4 C) 97.4 F (36.3 C) 97.7 F (36.5 C) 97.3 F (36.3 C)  TempSrc: Oral Oral Axillary Oral  Resp: 20 20 20 20   Height:      Weight:      SpO2: 98% 99% 99% 99%    Intake/Output Summary (Last 24 hours) at 10/19/12 0944 Last data filed at 10/18/12 1300  Gross per 24 hour  Intake    100 ml  Output      0 ml  Net    100 ml   Filed Weights   10/16/12 2341 10/17/12 0600  Weight: 126.1 kg (278 lb) 122.1 kg (269 lb 2.9 oz)    Exam:   General:  Appears calm and comfortable. Grimaces with  movement.  Cardiovascular: Regular rate and rhythm. No murmur, rub, gallop.  Respiratory: Clear to auscultation bilaterally. No wheezes, rales, rhonchi. Normal respiratory effort.  Abdomen: Lower abdominal induration appears the same with a small area of softnes. Erythema appears improved. Very tender to palpation.  Skin: Left thigh area of induration and erythema is l is improving rapidly with near resolution of erythema.   Data Reviewed: Basic Metabolic Panel:  Lab 10/19/12 7846 10/17/12 0130  NA 141 140  K 4.8 4.5  CL 100 101  CO2 31 29  GLUCOSE 100* 96  BUN 13 14  CREATININE 1.15 1.10  CALCIUM 9.9 9.7  MG -- --  PHOS -- --   CBC:  Lab 10/18/12 0705 10/17/12 0130  WBC 5.7 9.6  NEUTROABS -- 6.7  HGB 13.2 14.7  HCT 40.4 41.9  MCV 93.3 93.3  PLT 226 222   Studies: No results found.  Scheduled Meds:    . acetaminophen  1,000 mg Intravenous Q6H  . ampicillin-sulbactam (UNASYN) IV  3 g Intravenous Q6H  . bacitracin   Topical BID  . docusate sodium  100 mg Oral BID  . ibuprofen  800 mg Oral QID  . influenza  inactive virus vaccine  0.5 mL Intramuscular Tomorrow-1000  . nicotine  14 mg Transdermal Daily  . pneumococcal 13-valent conjugate vaccine  0.5 mL Intramuscular Tomorrow-1000   Continuous Infusions:    . sodium chloride 300 mL (10/18/12 0937)    Principal Problem:  *Cellulitis of left thigh Active  Problems:  Abdominal wall cellulitis     Brendia Sacks, MD  [facility] Team 4  Pager [phone] If 7PM-7AM, please contact night-coverage at [email or web address], password [facility] 10/19/2012, 9:44 AM  LOS: 2 days   Time spent: 15 minutes

## 2012-10-19 NOTE — Progress Notes (Signed)
General Surgery [facility]  Patient seen and examined.  Resolving cellulitis left thigh.  Will apply antibiotic ointment and dress wound bid.  Suprapubic induration and cellulitis, not fluctuent.  Continue IV abx per medical service.  If suprapubic develops fluctuence, will I&D at bedside.  Will add ibuprofen for anti-inflammatory effect and pain control.  Velora Heckler, MD, [facility] Office: [phone]

## 2012-10-20 ENCOUNTER — Telehealth (INDEPENDENT_AMBULATORY_CARE_PROVIDER_SITE_OTHER): Payer: Self-pay

## 2012-10-20 DIAGNOSIS — L03119 Cellulitis of unspecified part of limb: Secondary | ICD-10-CM

## 2012-10-20 DIAGNOSIS — L02419 Cutaneous abscess of limb, unspecified: Secondary | ICD-10-CM

## 2012-10-20 MED ORDER — BACITRACIN ZINC 500 UNIT/GM EX OINT: TOPICAL_OINTMENT | Freq: Two times a day (BID) | CUTANEOUS | Status: DC

## 2012-10-20 MED ORDER — OXYCODONE-ACETAMINOPHEN 5-325 MG PO TABS: 1.0000 | ORAL_TABLET | Freq: Four times a day (QID) | ORAL | Status: DC | PRN

## 2012-10-20 MED ORDER — AMOXICILLIN-POT CLAVULANATE 875-125 MG PO TABS: 1.0000 | ORAL_TABLET | Freq: Two times a day (BID) | ORAL | Status: DC

## 2012-10-20 MED ORDER — AMOXICILLIN-POT CLAVULANATE 875-125 MG PO TABS
1.0000 | ORAL_TABLET | Freq: Two times a day (BID) | ORAL | Status: DC
  Administered 2012-10-20: 1 via ORAL
  Filled 2012-10-20 (×2): qty 1

## 2012-10-20 NOTE — Progress Notes (Signed)
General Surgery [facility]  Patient seen and examined.  Clinically improved.  Would agree with discharge home on po abx, cleanse wounds with soap (Dial, Lever 2000), and keep wounds covered with gauze.  Use antibiotic ointment on open wound.  Likely will have small spontaneous drainage from suprapubic area soon.  Just wash and keep covered.  Recommend hand sanitizer frequently.  Will see in [facility] office for follow up if needed.  Information given to patient.  Will sign off in anticipation of discharge home today from medical service.  Velora Heckler, MD, [facility] Office: [phone]

## 2012-10-20 NOTE — Progress Notes (Signed)
[facility] PROGRESS NOTE  [name] ZOX:[id number] DOB: May 29, 1976 DOA: 10/17/2012 PCP: No primary provider on file.  Assessment/Plan: 1. Cellulitis left thigh, lower abdomen--much improved. Clear for discharge by surgery. Culture report from incision and drainage at [facility] reviewed: Staph aureus, sensitive to oxacillin, clindamycin, Bactrim, vancomycin. Anaerobic report pending.   Cleanse wounds with soap (Dial, Lever 2000), and keep wounds covered with gauze. Use antibiotic ointment on open wound. Likely will have small spontaneous drainage from suprapubic area soon. Just wash and keep covered. Recommend hand sanitizer frequently. Followup with Gen. surgery as needed.  Code Status: Full code  Family Communication: None present  Disposition Plan: Home today.  Brendia Sacks, MD  [facility] Team 4  Pager [phone] If 7PM-7AM, please contact night-coverage at [email or web address], password [facility] 10/20/2012, 11:33 AM  LOS: 3 days   Brief narrative: 36 year old man with erythema and swelling of left leg and lower abdomen for 6 days, presented to med [facility] and was transferred to [facility] for IV antibiotics and further evaluation.  Consultants:  General surgery  Procedures:    Antibiotics:  Unasyn 12/28 >> 12/31  Vancomycin 12/28 >> 12/29  Augmentin 12/31 >> 1/6  HPI/Subjective: Continues to feel better. Percocet controlling pain. Thigh wound much improved. Abdomen less tender.  Objective: Filed Vitals:   10/19/12 0533 10/19/12 1433 10/19/12 2200 10/20/12 0600  BP: 149/98 136/77 123/85 124/72  Pulse: 78 67 90 71  Temp: 97.3 F (36.3 C) 97.5 F (36.4 C) 97.7 F (36.5 C) 97.4 F (36.3 C)  TempSrc: Oral Oral Oral Oral  Resp: 20 20 20 20   Height:      Weight:      SpO2: 99% 97% 98% 98%    Intake/Output Summary (Last 24 hours) at 10/20/12 1133 Last data filed at 10/20/12 0900  Gross per 24 hour  Intake    600 ml  Output      2  ml  Net    598 ml   Filed Weights   10/16/12 2341 10/17/12 0600  Weight: 126.1 kg (278 lb) 122.1 kg (269 lb 2.9 oz)    Exam:   General:  Appears calm and comfortable. Moves easily.  Cardiovascular: Regular rate and rhythm. No murmur, rub, gallop.  Respiratory: Clear to auscultation bilaterally. No wheezes, rales, rhonchi. Normal respiratory effort.  Abdomen: Lower abdominal induration appears much improved, minimal erythema. Less tender.  Skin: Left thigh area induration appears resolved. Erythema appears resolved. Much improved.  Data Reviewed: Basic Metabolic Panel:  Lab 10/19/12 8295 10/17/12 0130  NA 141 140  K 4.8 4.5  CL 100 101  CO2 31 29  GLUCOSE 100* 96  BUN 13 14  CREATININE 1.15 1.10  CALCIUM 9.9 9.7  MG -- --  PHOS -- --   CBC:  Lab 10/18/12 0705 10/17/12 0130  WBC 5.7 9.6  NEUTROABS -- 6.7  HGB 13.2 14.7  HCT 40.4 41.9  MCV 93.3 93.3  PLT 226 222   Studies: No results found.  Scheduled Meds:    . ampicillin-sulbactam (UNASYN) IV  3 g Intravenous Q6H  . bacitracin   Topical BID  . docusate sodium  100 mg Oral BID  . ibuprofen  800 mg Oral QID  . nicotine  14 mg Transdermal Daily   Continuous Infusions:    . sodium chloride 1,000 mL (10/19/12 2001)    Principal Problem:  *Cellulitis of left thigh Active Problems:  Abdominal wall cellulitis     Brendia Sacks, MD  [facility] Team 4  Pager [phone] If 7PM-7AM, please contact night-coverage at [email or web address], password [facility] 10/20/2012, 11:33 AM  LOS: 3 days

## 2012-10-20 NOTE — Progress Notes (Signed)
Pt d/c to home by car with family. Assessment stable, prescription given. Verbalizes understanding of d/c instructions.

## 2012-10-20 NOTE — Discharge Summary (Signed)
Physician Discharge Summary  [name] ZOX:[id number] DOB: [birth year] DOA: 10/17/2012  PCP: No primary provider on file. patient will have insurance soon through his work and will establish with primary care physician then.  Admit date: 10/17/2012 Discharge date: 10/20/2012  Recommendations for Outpatient Follow-up:  1. Resolution of cellulitis. 2. Recommend smoking cessation.   Follow-up Information    Follow up with Velora Heckler, MD. (As needed)    Contact information:   [address] [place] [address] [phone]         Discharge Diagnoses:  1. Cellulitis left thigh, lower abdomen   Discharge Condition: Improved Disposition: Home  Diet recommendation: Regular  Filed Weights   10/16/12 2341 10/17/12 0600  Weight: 126.1 kg (278 lb) 122.1 kg (269 lb 2.9 oz)    History of present illness:  36 year old man with erythema and swelling of left leg and lower abdomen for 6 days, presented to med [facility] and was transferred to [facility] for IV antibiotics and further evaluation.   Hospital Course:  Mr. Woolbright was admitted for cellulitis and placed on IV antibiotics. CT of the abdomen and pelvis was negative for abscess. He was seen by general surgery but did not require intervention. He improved on IV antibiotics and coverage was narrowed to Unasyn based on culture report from [facility]. He has continued to improve on Unasyn has been cleared by general surgery for discharge home.  1. Cellulitis left thigh, lower abdomen--much improved. Clear for discharge by surgery. Culture report from incision and drainage at [facility] reviewed: Staph aureus, sensitive to oxacillin, clindamycin, Bactrim, vancomycin.  Consultants:  General surgery  Procedures:  None  Antibiotics:  Unasyn 12/28 >> 12/31   Vancomycin 12/28 >> 12/29   Augmentin 12/31 >> 1/6  Discharge Instructions  Discharge Orders    Future Orders Please Complete  By Expires   Diet general      Activity as tolerated - No restrictions      Discharge instructions      Comments:   Cleanse wounds with soap (Dial, Lever 2000), and keep wounds covered with gauze. Use antibiotic ointment on open wound. Likely will have small spontaneous drainage from suprapubic area soon. Just wash and keep covered. Recommend hand sanitizer frequently. Followup with Gen. surgery as needed. Be sure to finish your antibiotic (Augmentin). Call physician or seek medical care or increased pain, fever or worsening of condition. Please establish with a primary care physician when able.       Medication List     As of 10/20/2012 11:45 AM    STOP taking these medications         BACTRIM DS 800-160 MG per tablet   Generic drug: sulfamethoxazole-trimethoprim      KEFLEX 500 MG capsule   Generic drug: cephALEXin      VISINE MAXIMUM REDNESS RELIEF OP      TAKE these medications         amoxicillin-clavulanate 875-125 MG per tablet   Commonly known as: AUGMENTIN   Take 1 tablet by mouth every 12 (twelve) hours. Start evening 12/31.      bacitracin ointment   Apply topically 2 (two) times daily. Apply to open wound.      oxyCODONE-acetaminophen 5-325 MG per tablet   Commonly known as: PERCOCET/ROXICET   Take 1 tablet by mouth every 6 (six) hours as needed for pain.         The results of significant diagnostics from this  hospitalization (including imaging, microbiology, ancillary and laboratory) are listed below for reference.    Significant Diagnostic Studies: Ct Pelvis W Contrast  10/17/2012  *RADIOLOGY REPORT*  Clinical Data:  Left thigh soft tissue swelling, extending to the left groin.  Concern for abscess.  CT PELVIS WITH CONTRAST  Technique:  Multidetector CT imaging of the pelvis was performed using the standard protocol following the bolus administration of intravenous contrast.  Contrast: 80mL OMNIPAQUE IOHEXOL 300 MG/ML  SOLN  Comparison:   None.  Findings:   There is no evidence for abscess.  Focal soft tissue swelling is noted along the anterior aspect of the proximal left thigh, compatible with cellulitis.  Underlying soft tissue inflammation is seen extending into the deeper soft tissues, without definite fascial involvement.  This is relatively localized, without evidence of extension to the groin or more inferiorly along the thigh, though very minimal soft tissue inflammation is seen extending distally along the anterior and medial left thigh.  Additional focal soft tissue inflammation is noted superior to the penile shaft, with associated cellulitis.  This has a relatively diffuse appearance, and there is mild soft tissue inflammation just above the penile shaft, without evidence of a focal fluid collection.  Scattered inguinal nodes remain normal in size.  There is mild nonspecific scrotal wall edema, possibly chronic in nature.  There is no evidence of vascular compromise.  The visualized small and large bowel loops are unremarkable in appearance.  The appendix is normal in caliber, without evidence for appendicitis.  The bladder is mildly distended and grossly unremarkable.  The prostate remains normal in size, with minimal calcification.  No acute osseous abnormalities are identified.  IMPRESSION:  1.  No evidence for abscess. 2.  Two separate foci of relatively significant deep soft tissue inflammation.  One is seen at the anterior aspect of the proximal left thigh, without definite fascial involvement.  The second is noted superior to the penile shaft, with extension adjacent to the penile shaft, but no focal fluid collection. 3.  Minimal soft tissue inflammation noted extending distally along the anterior and medial left thigh; cellulitis involving the anterior proximal left thigh, and superior to the penile shaft. 4.  Apparent mild nonspecific scrotal wall edema may be chronic in nature.   Original Report Authenticated By: Tonia Ghent, M.D.      Microbiology: Recent Results (from the past 240 hour(s))  CULTURE, BLOOD (ROUTINE X 2)     Status: Normal (Preliminary result)   Collection Time   10/17/12  4:30 AM      Component Value Range Status Comment   Specimen Description BLOOD LEFT ARM   Final    Special Requests BOTTLES DRAWN AEROBIC AND ANAEROBIC 10cc   Final    Culture  Setup Time 10/17/2012 06:55   Final    Culture     Final    Value:        BLOOD CULTURE RECEIVED NO GROWTH TO DATE CULTURE WILL BE HELD FOR 5 DAYS BEFORE ISSUING A FINAL NEGATIVE REPORT   Report Status PENDING   Incomplete      Labs: Basic Metabolic Panel:  Lab 10/19/12 0865 10/17/12 0130  NA 141 140  K 4.8 4.5  CL 100 101  CO2 31 29  GLUCOSE 100* 96  BUN 13 14  CREATININE 1.15 1.10  CALCIUM 9.9 9.7  MG -- --  PHOS -- --   CBC:  Lab 10/18/12 0705 10/17/12 0130  WBC 5.7 9.6  NEUTROABS -- 6.7  HGB 13.2 14.7  HCT 40.4 41.9  MCV 93.3 93.3  PLT 226 222   Principal Problem:  *Cellulitis of left thigh Active Problems:  Abdominal wall cellulitis   Time coordinating discharge: 20 minutes  Signed:  Brendia Sacks, MD [facility] 10/20/2012, 11:45 AM

## 2012-10-20 NOTE — Care Management Note (Signed)
    Page 1 of 1   10/20/2012     3:43:59 PM   CARE MANAGEMENT NOTE 10/20/2012  Patient:  [name], [name]   Account Number:  [id number]  Date Initiated:  10/19/2012  Documentation initiated by:  Jacquelynn Cree  Subjective/Objective Assessment:   Admitted with cellulitis left thigh and lower abdomen     Action/Plan:   plan IV antibiotics   Anticipated DC Date:  10/22/2012   Anticipated DC Plan:  HOME/SELF CARE      DC Planning Services  CM consult  MATCH Program      Choice offered to / List presented to:             Status of service:  Completed, signed off Medicare Important Message given?   (If response is "NO", the following Medicare IM given date fields will be blank) Date Medicare IM given:   Date Additional Medicare IM given:    Discharge Disposition:  HOME/SELF CARE  Per UR Regulation:  Reviewed for med. necessity/level of care/duration of stay  If discussed at Long Length of Stay Meetings, dates discussed:    Comments:  10/20/12 Patient discharged on po augmentin,used MATCH. Gave patient information on [facility] and pharmacy discount card. Jacquelynn Cree RN, BSN, CCM

## 2012-10-20 NOTE — Progress Notes (Signed)
Patient ID: [name], male   DOB: Aug 29, 1976, 36 y.o.   MRN: [id number]    Subjective: Patient states that pain is better controlled on percocet, feels that things are better from a physical standpoint. Would very much like to go home today.  Objective: Vital signs in last 24 hours: Temp:  [97.4 F (36.3 C)-97.7 F (36.5 C)] 97.4 F (36.3 C) (12/31 0600) Pulse Rate:  [67-90] 71  (12/31 0600) Resp:  [20] 20  (12/31 0600) BP: (123-136)/(72-85) 124/72 mmHg (12/31 0600) SpO2:  [97 %-98 %] 98 % (12/31 0600) Last BM Date: 10/18/12  Intake/Output from previous day: 12/30 0701 - 12/31 0700 In: 240 [P.O.:240] Out: -  Intake/Output this shift:    General appearance: alert, cooperative, appears stated age and no distress Left thigh is actually soft with only mild induration, no drainage from opened area. Suprapubic area still with  induration but no fluctuance. Measures approx 9cm x 5cm Patient's c/o of pain better controlled. VSS, afebrile    Lab Results:   [facility] 10/18/12 0705  WBC 5.7  HGB 13.2  HCT 40.4  PLT 226   BMET  Basename 10/19/12 0803  NA 141  K 4.8  CL 100  CO2 31  GLUCOSE 100*  BUN 13  CREATININE 1.15  CALCIUM 9.9   PT/INR No results found for this basename: LABPROT:2,INR:2 in the last 72 hours ABG No results found for this basename: PHART:2,PCO2:2,PO2:2,HCO3:2 in the last 72 hours  Studies/Results: No results found.  Anti-infectives: Anti-infectives     Start     Dose/Rate Route Frequency Ordered Stop   10/17/12 1200   Ampicillin-Sulbactam (UNASYN) 3 g in sodium chloride 0.9 % 100 mL IVPB        3 g 100 mL/hr over 60 Minutes Intravenous Every 6 hours 10/17/12 0936     10/17/12 1000   vancomycin (VANCOCIN) 1,250 mg in sodium chloride 0.9 % 250 mL IVPB  Status:  Discontinued        1,250 mg 166.7 mL/hr over 90 Minutes Intravenous Every 12 hours 10/17/12 0936 10/18/12 0937   10/17/12 0400   vancomycin (VANCOCIN) IVPB 1000 mg/200 mL premix          1,000 mg 200 mL/hr over 60 Minutes Intravenous  Once 10/17/12 0353 10/17/12 0505   10/17/12 0400   piperacillin-tazobactam (ZOSYN) IVPB 3.375 g        3.375 g 100 mL/hr over 30 Minutes Intravenous  Once 10/17/12 0353 10/17/12 0543          Assessment/Plan:  Patient Active Problem List  Diagnosis  . Cellulitis of left thigh  . Abdominal wall cellulitis   s/p * No surgery found * Plan: 1. Continue ABX 2. Continue warm compresses qid, and bacitracin BID as ordered. 3. Patient has expressed a strong desire to go home today, it seems reasonable that he could be treated at home with po abx, pain meds, and continued warm packs and bacitracin if okay with medicine team, as clinical picture continues to improve daily, and it does not appear that he will require any surgical intervention at present.    LOS: 3 days    Golda Acre [facility] Pager # [phone] 10/20/2012

## 2012-10-20 NOTE — Telephone Encounter (Signed)
Message copied by Joanette Gula on Tue Oct 20, 2012  9:05 AM ------      Message from: Velora Heckler      Created: Tue Oct 20, 2012  8:52 AM       12/31  [facility]            Dx: cellulitis left thigh and abdominal wall            Level 1 follow up.            Should go home today.  If needs follow up, can come to DOW clinic.            tmg            Velora Heckler, MD, [facility]      Office: [phone]

## 2012-10-23 LAB — CULTURE, BLOOD (ROUTINE X 2)

## 2013-06-01 ENCOUNTER — Encounter: Payer: Self-pay | Admitting: Physician Assistant

## 2013-06-01 ENCOUNTER — Emergency Department (HOSPITAL_BASED_OUTPATIENT_CLINIC_OR_DEPARTMENT_OTHER): Payer: BC Managed Care – PPO

## 2013-06-01 ENCOUNTER — Emergency Department (HOSPITAL_BASED_OUTPATIENT_CLINIC_OR_DEPARTMENT_OTHER)
Admission: EM | Admit: 2013-06-01 | Discharge: 2013-06-01 | Disposition: A | Discharge status: Home / Self Care | Payer: BC Managed Care – PPO | Attending: Emergency Medicine | Admitting: Emergency Medicine

## 2013-06-01 ENCOUNTER — Encounter (HOSPITAL_BASED_OUTPATIENT_CLINIC_OR_DEPARTMENT_OTHER): Payer: Self-pay | Admitting: *Deleted

## 2013-06-01 ENCOUNTER — Ambulatory Visit (INDEPENDENT_AMBULATORY_CARE_PROVIDER_SITE_OTHER): Payer: BC Managed Care – PPO | Admitting: Physician Assistant

## 2013-06-01 ENCOUNTER — Ambulatory Visit (HOSPITAL_BASED_OUTPATIENT_CLINIC_OR_DEPARTMENT_OTHER)
Admission: RE | Admit: 2013-06-01 | Discharge: 2013-06-01 | Disposition: A | Discharge status: Home / Self Care | Payer: BC Managed Care – PPO | Source: Ambulatory Visit | Attending: Physician Assistant | Admitting: Physician Assistant

## 2013-06-01 VITALS — BP 128/88 | HR 79 | Temp 98.4°F | Resp 16 | Ht 72.0 in | Wt 258.5 lb

## 2013-06-01 DIAGNOSIS — N2889 Other specified disorders of kidney and ureter: Secondary | ICD-10-CM

## 2013-06-01 DIAGNOSIS — C649 Malignant neoplasm of unspecified kidney, except renal pelvis: Secondary | ICD-10-CM | POA: Insufficient documentation

## 2013-06-01 DIAGNOSIS — F17200 Nicotine dependence, unspecified, uncomplicated: Secondary | ICD-10-CM | POA: Insufficient documentation

## 2013-06-01 DIAGNOSIS — L02219 Cutaneous abscess of trunk, unspecified: Secondary | ICD-10-CM | POA: Insufficient documentation

## 2013-06-01 DIAGNOSIS — IMO0001 Reserved for inherently not codable concepts without codable children: Secondary | ICD-10-CM | POA: Insufficient documentation

## 2013-06-01 DIAGNOSIS — L03319 Cellulitis of trunk, unspecified: Secondary | ICD-10-CM

## 2013-06-01 DIAGNOSIS — R9389 Abnormal findings on diagnostic imaging of other specified body structures: Secondary | ICD-10-CM

## 2013-06-01 DIAGNOSIS — N289 Disorder of kidney and ureter, unspecified: Secondary | ICD-10-CM

## 2013-06-01 DIAGNOSIS — R197 Diarrhea, unspecified: Secondary | ICD-10-CM | POA: Insufficient documentation

## 2013-06-01 DIAGNOSIS — Z872 Personal history of diseases of the skin and subcutaneous tissue: Secondary | ICD-10-CM | POA: Insufficient documentation

## 2013-06-01 DIAGNOSIS — R93429 Abnormal radiologic findings on diagnostic imaging of unspecified kidney: Secondary | ICD-10-CM

## 2013-06-01 DIAGNOSIS — L0390 Cellulitis, unspecified: Secondary | ICD-10-CM

## 2013-06-01 DIAGNOSIS — L03311 Cellulitis of abdominal wall: Secondary | ICD-10-CM

## 2013-06-01 HISTORY — DX: Cellulitis, unspecified: L03.90

## 2013-06-01 HISTORY — DX: Malignant neoplasm of unspecified kidney, except renal pelvis: C64.9

## 2013-06-01 LAB — BASIC METABOLIC PANEL
BUN: 15 mg/dL (ref 6–23)
Creatinine, Ser: 1.1 mg/dL (ref 0.50–1.35)
GFR calc Af Amer: >90 mL/min (ref >90)
GFR calc non Af Amer: 84 mL/min — ABNORMAL LOW (ref >90)
Glucose, Bld: 127 mg/dL — ABNORMAL HIGH (ref 70–99)

## 2013-06-01 LAB — CBC WITH DIFFERENTIAL/PLATELET
Basophils Relative: 0 % (ref 0–1)
Eosinophils Absolute: 0.3 10*3/uL (ref 0.0–0.7)
HCT: 46.3 % (ref 39.0–52.0)
Hemoglobin: 16.3 g/dL (ref 13.0–17.0)
Lymphs Abs: 2.1 10*3/uL (ref 0.7–4.0)
MCH: 33.3 pg (ref 26.0–34.0)
MCHC: 35.2 g/dL (ref 30.0–36.0)
MCV: 94.7 fL (ref 78.0–100.0)
Monocytes Absolute: 1 10*3/uL (ref 0.1–1.0)
Monocytes Relative: 8 % (ref 3–12)

## 2013-06-01 LAB — URINALYSIS, ROUTINE W REFLEX MICROSCOPIC
Bilirubin Urine: NEGATIVE
Glucose, UA: NEGATIVE mg/dL
Hgb urine dipstick: NEGATIVE
Ketones, ur: NEGATIVE mg/dL
pH: 6.5 (ref 5.0–8.0)

## 2013-06-01 MED ORDER — DOXYCYCLINE HYCLATE 100 MG PO CAPS: 100.0000 mg | ORAL_CAPSULE | Freq: Two times a day (BID) | ORAL | Status: DC

## 2013-06-01 MED ORDER — OXYCODONE-ACETAMINOPHEN 5-325 MG PO TABS: 1.0000 | ORAL_TABLET | Freq: Four times a day (QID) | ORAL | Status: DC | PRN

## 2013-06-01 MED ORDER — KETOROLAC TROMETHAMINE 30 MG/ML IJ SOLN
30.0000 mg | Freq: Once | INTRAMUSCULAR | Status: AC
  Administered 2013-06-01: 30 mg via INTRAVENOUS
  Filled 2013-06-01: qty 1

## 2013-06-01 MED ORDER — IOHEXOL 300 MG/ML  SOLN
50.0000 mL | Freq: Once | INTRAMUSCULAR | Status: AC | PRN
  Administered 2013-06-01: 50 mL via ORAL

## 2013-06-01 MED ORDER — CEPHALEXIN 500 MG PO CAPS: 500.0000 mg | ORAL_CAPSULE | Freq: Four times a day (QID) | ORAL | Status: DC

## 2013-06-01 MED ORDER — CEFTRIAXONE SODIUM 1 G IJ SOLR
1.0000 g | INTRAMUSCULAR | Status: DC
  Administered 2013-06-01: 1 g via INTRAMUSCULAR
  Filled 2013-06-01: qty 10

## 2013-06-01 MED ORDER — IOHEXOL 300 MG/ML  SOLN
100.0000 mL | Freq: Once | INTRAMUSCULAR | Status: AC | PRN
  Administered 2013-06-01: 100 mL via INTRAVENOUS

## 2013-06-01 MED ORDER — OXYCODONE-ACETAMINOPHEN 5-325 MG PO TABS
2.0000 | ORAL_TABLET | Freq: Once | ORAL | Status: AC
  Administered 2013-06-01: 2 via ORAL
  Filled 2013-06-01 (×2): qty 2

## 2013-06-01 MED ORDER — DOXYCYCLINE HYCLATE 100 MG PO TABS
100.0000 mg | ORAL_TABLET | Freq: Once | ORAL | Status: AC
  Administered 2013-06-01: 100 mg via ORAL
  Filled 2013-06-01: qty 1

## 2013-06-01 NOTE — Assessment & Plan Note (Signed)
Renal function normal with today's labs in ED.  Patient set up for Renal US this evening.  Will call patient with results and proceed from there.

## 2013-06-01 NOTE — ED Provider Notes (Signed)
CSN: [id number]     Arrival date & time 06/01/13  0219 History     First MD Initiated Contact with Patient 06/01/13 0227     Chief Complaint  Patient presents with  . Abdominal Pain   (Consider location/radiation/quality/duration/timing/severity/associated sxs/prior Treatment) Patient is a 37 y.o. male presenting with abdominal pain and rash. The history is provided by the patient.  Abdominal Pain Pain location:  Suprapubic Pain quality: aching   Pain radiates to:  Does not radiate Pain severity:  Severe Onset quality:  Gradual Timing:  Constant Progression:  Unchanged Chronicity:  Recurrent Relieved by:  Nothing Worsened by:  Nothing tried Ineffective treatments:  None tried Associated symptoms: diarrhea   Associated symptoms: no anorexia, no fever and no vomiting   Risk factors: no recent hospitalization   Rash Location:  Torso Torso rash location: above umbilicus. Quality: painful and redness   Pain details:    Quality:  Aching   Severity:  Severe   Onset quality:  Gradual   Timing:  Constant Severity:  Severe Onset quality:  Gradual Timing:  Constant Progression:  Unchanged Chronicity:  New Context: not animal contact   Relieved by:  Nothing Associated symptoms: abdominal pain and diarrhea   Associated symptoms: no fever and not vomiting   cellulitis 2 cm x 1 cm above umbilicus and suprapubic pain with diarrhea  Past Medical History  Diagnosis Date  . Cellulitis    Past Surgical History  Procedure Laterality Date  . Wrist surgery    . Vasectomy     Family History  Problem Relation Age of Onset  . Cirrhosis Father    History  Substance Use Topics  . Smoking status: Current Every Day Smoker    Types: Cigarettes  . Smokeless tobacco: Never Used  . Alcohol Use: No    Review of Systems  Constitutional: Negative for fever.  Gastrointestinal: Positive for abdominal pain and diarrhea. Negative for vomiting and anorexia.  Skin: Positive for rash.    All other systems reviewed and are negative.    Allergies  Bee venom and Hornet venom  Home Medications   Current Outpatient Rx  Name  Route  Sig  Dispense  Refill  . amoxicillin-clavulanate (AUGMENTIN) 875-125 MG per tablet   Oral   Take 1 tablet by mouth every 12 (twelve) hours. Start evening 12/31.   13 tablet   0   . bacitracin ointment   Topical   Apply topically 2 (two) times daily. Apply to open wound.         Marland Kitchen oxyCODONE-acetaminophen (PERCOCET/ROXICET) 5-325 MG per tablet   Oral   Take 1 tablet by mouth every 6 (six) hours as needed for pain.   30 tablet   0    BP 164/101  Pulse 96  Temp(Src) 97.8 F (36.6 C) (Oral)  Resp 20  Ht 6\' 1"  (1.854 m)  Wt 257 lb (116.574 kg)  BMI 33.91 kg/m2  SpO2 100% Physical Exam  Constitutional: He is oriented to person, place, and time. He appears well-developed and well-nourished. No distress.  HENT:  Head: Normocephalic and atraumatic.  Mouth/Throat: Oropharynx is clear and moist.  Eyes: Conjunctivae are normal. Pupils are equal, round, and reactive to light.  Neck: Normal range of motion. Neck supple.  No groin lymph nodes   Cardiovascular: Normal rate, regular rhythm and intact distal pulses.   Pulmonary/Chest: Effort normal and breath sounds normal. He has no wheezes. He has no rales.  Abdominal: Soft. Bowel sounds are normal.  There is no tenderness. There is no rebound and no guarding.    Neurological: He is alert and oriented to person, place, and time.  Skin: Skin is warm and dry.  Psychiatric: He has a normal mood and affect.    ED Course   Procedures (including critical care time)  Labs Reviewed  URINALYSIS, ROUTINE W REFLEX MICROSCOPIC  CBC WITH DIFFERENTIAL  BASIC METABOLIC PANEL   No results found. No diagnosis found.  MDM  There are no indications for admission at this time.  Recheck in 2 days.  Will prescribe doxycycline and keflex as well as percocet for pain.  Return sooner for fevers >  101.  Streaking intability to tolerate PO.  Left renal lesion.  Patient informed of CT finding and need to have his family doctor schedule outpatient ultrasound to better characterize lesion seen on CT scan.  Patient informed of differential diagnosis.  Patient verbalizes understanding and agrees to follow up to have this test scheduled as an outpatient.  This information is also printed on patient's discharge paperwork.  Patient verbalizes understanding of all instructions and agrees to follow up.    Jasmine Awe, MD 06/01/13 [phone]

## 2013-06-01 NOTE — Assessment & Plan Note (Signed)
Given Rx for Doxycycline and Cephalexin in the ED.  Patient instructed to take all antibiotics as prescribed.  Educated on alarm symptoms.

## 2013-06-01 NOTE — Patient Instructions (Addendum)
Please go to Imaging department at specified time for Ultrasound of the Kidneys.  I will call you with the results.  Take all antibiotics until all pills are gone.  Schedule a complete physical within the next few weeks.   Cellulitis Cellulitis is an infection of the skin and the tissue beneath it. The infected area is usually red and tender. Cellulitis occurs most often in the arms and lower legs.  CAUSES  Cellulitis is caused by bacteria that enter the skin through cracks or cuts in the skin. The most common types of bacteria that cause cellulitis are Staphylococcus and Streptococcus. SYMPTOMS   Redness and warmth.  Swelling.  Tenderness or pain.  Fever. DIAGNOSIS  Your caregiver can usually determine what is wrong based on a physical exam. Blood tests may also be done. TREATMENT  Treatment usually involves taking an antibiotic medicine. HOME CARE INSTRUCTIONS   Take your antibiotics as directed. Finish them even if you start to feel better.  Keep the infected arm or leg elevated to reduce swelling.  Apply a warm cloth to the affected area up to 4 times per day to relieve pain.  Only take over-the-counter or prescription medicines for pain, discomfort, or fever as directed by your caregiver.  Keep all follow-up appointments as directed by your caregiver. SEEK MEDICAL CARE IF:   You notice red streaks coming from the infected area.  Your red area gets larger or turns dark in color.  Your bone or joint underneath the infected area becomes painful after the skin has healed.  Your infection returns in the same area or another area.  You notice a swollen bump in the infected area.  You develop new symptoms. SEEK IMMEDIATE MEDICAL CARE IF:   You have a fever.  You feel very sleepy.  You develop vomiting or diarrhea.  You have a general ill feeling (malaise) with muscle aches and pains. MAKE SURE YOU:   Understand these instructions.  Will watch your  condition.  Will get help right away if you are not doing well or get worse. Document Released: 07/17/2005 Document Revised: 04/07/2012 Document Reviewed: 12/23/2011 [facility] Patient Information 2014 [place], [place].

## 2013-06-01 NOTE — ED Notes (Signed)
Pt verbalized understanding of discharge instructions and need to have follow up US of kidney.

## 2013-06-01 NOTE — ED Notes (Signed)
Patient called back to get a referral for an ultrasound from Dr. Terressa Koyanagi.  Discussed with Dr. Su Hoff.  Pt will need to follow up with a Primary Care MD.  Referral given to [facility] @ [facility] @ [phone].

## 2013-06-01 NOTE — ED Notes (Signed)
Patient transported to CT 

## 2013-06-01 NOTE — Progress Notes (Signed)
Patient ID: [name], male   DOB: 18-Jul-1976, 38 y.o.   MRN: [id number]  Patient presents to establish care and for ED follow-up from this am with diagnosis of cellulitis.  Patient was seen in the ED, diagnosed and treated for cellulitis with doxycycline and cephalexin.  Patient has yet to start antibiotic therapy.  States he presents to clinic due to abnormal abdominal CT found in the ED.  Patient states there is a questionable spot on his left kidney.  Patient endorses occasional transient stabbing pain in his left flank.  Pain usually goes away quickly.  Has not taken anything for pain.  Pain does not seem to be occurring more frequently.  Denies dysuria, urinary urgency, frequency or hematuria.  Denies R flank pain.  Denies trauma to back.  Denies fever, chills, sweats.  Denies hx of kidney stone.  Has otherwise been feeling good.  Patient does not currently go to the dentist or see an optometrist for annual eye examination.  Past Medical History  Diagnosis Date  . Cellulitis   . Hx of vasectomy   . Chicken pox    Current Outpatient Prescriptions on File Prior to Visit  Medication Sig Dispense Refill  . cephALEXin (KEFLEX) 500 MG capsule Take 1 capsule (500 mg total) by mouth 4 (four) times daily.  28 capsule  0  . doxycycline (VIBRAMYCIN) 100 MG capsule Take 1 capsule (100 mg total) by mouth 2 (two) times daily. One po bid x 7 days  14 capsule  0  . oxyCODONE-acetaminophen (PERCOCET) 5-325 MG per tablet Take 1 tablet by mouth every 6 (six) hours as needed for pain.  13 tablet  0   No current facility-administered medications on file prior to visit.   Allergies  Allergen Reactions  . Bee Venom Shortness Of Breath and Swelling    Arm swells  . Hornet Venom Shortness Of Breath and Swelling    Arm swells   Family History  Problem Relation Age of Onset  . Cirrhosis Father   . Colitis Father   . Heart disease Father   . Asthma Father   . Heart attack Other     Paternal  Grandparents  . Stroke Other     Paternal Grandparents  . Prostate cancer Paternal Grandfather   . Diabetes Maternal Grandfather   . Alcohol abuse Mother   . Other Brother     Intestinal Fissure  . Healthy Sister   . Asthma Son   . Other Father     Chemical Imbalance  . Other Brother     Chemical Imbalance   History   Social History  . Marital Status: Divorced    Spouse Name: N/A    Number of Children: N/A  . Years of Education: N/A   Social History Main Topics  . Smoking status: Current Every Day Smoker    Types: Cigarettes  . Smokeless tobacco: Never Used  . Alcohol Use: No  . Drug Use: No  . Sexually Active: Yes   Other Topics Concern  . None   Social History Narrative  . None   Review of Systems  Constitutional: Negative for fever, chills, weight loss and malaise/fatigue.  HENT: Positive for hearing loss. Negative for ear pain and tinnitus.   Eyes: Negative for blurred vision, double vision, photophobia and pain.  Respiratory: Negative for cough, sputum production, shortness of breath and wheezing.   Cardiovascular: Negative for chest pain and palpitations.  Gastrointestinal: Positive for abdominal pain, diarrhea and constipation. Negative  for nausea, vomiting, blood in stool and melena.  Genitourinary: Negative for dysuria, urgency, frequency, hematuria and flank pain.  Musculoskeletal: Positive for back pain.  Skin: Positive for rash.  Neurological: Positive for headaches. Negative for seizures and loss of consciousness.  Endo/Heme/Allergies: Does not bruise/bleed easily.  Psychiatric/Behavioral: Negative for depression, suicidal ideas and substance abuse.   Filed Vitals:   06/01/13 1430  BP: 128/88  Pulse: 79  Temp: 98.4 F (36.9 C)  Resp: 16   Physical Exam  Constitutional: He is oriented to person, place, and time and well-developed, well-nourished, and in no distress.  HENT:  Head: Normocephalic and atraumatic.  Right Ear: External ear normal.   Left Ear: External ear normal.  Nose: Nose normal.  Mouth/Throat: Oropharynx is clear and moist. No oropharyngeal exudate.  TM WNL  Eyes: Conjunctivae are normal. Pupils are equal, round, and reactive to light.  Neck: Normal range of motion. Neck supple.  Cardiovascular: Normal rate, regular rhythm, normal heart sounds and intact distal pulses.   Pulmonary/Chest: Effort normal and breath sounds normal. No respiratory distress. He has no wheezes. He has no rales. He exhibits no tenderness.  Abdominal: Soft. Bowel sounds are normal. He exhibits no distension and no mass. There is no tenderness. There is no rebound and no guarding.  Musculoskeletal: He exhibits no tenderness.  No CVA Tenderness noted  Lymphadenopathy:    He has no cervical adenopathy.  Neurological: He is alert and oriented to person, place, and time. He has normal reflexes. No cranial nerve deficit.  Skin: Skin is warm and dry.  Cellulitis of abdomen    Recent Results (from the past 2160 hour(s))  URINALYSIS, ROUTINE W REFLEX MICROSCOPIC     Status: None   Collection Time    06/01/13  2:29 AM      Result Value Range   Color, Urine YELLOW  YELLOW   APPearance CLEAR  CLEAR   Specific Gravity, Urine 1.009  1.005 - 1.030   pH 6.5  5.0 - 8.0   Glucose, UA NEGATIVE  NEGATIVE mg/dL   Hgb urine dipstick NEGATIVE  NEGATIVE   Bilirubin Urine NEGATIVE  NEGATIVE   Ketones, ur NEGATIVE  NEGATIVE mg/dL   Protein, ur NEGATIVE  NEGATIVE mg/dL   Urobilinogen, UA 0.2  0.0 - 1.0 mg/dL   Nitrite NEGATIVE  NEGATIVE   Leukocytes, UA NEGATIVE  NEGATIVE   Comment: MICROSCOPIC NOT DONE ON URINES WITH NEGATIVE PROTEIN, BLOOD, LEUKOCYTES, NITRITE, OR GLUCOSE <1000 mg/dL.  CBC WITH DIFFERENTIAL     Status: Abnormal   Collection Time    06/01/13  2:40 AM      Result Value Range   WBC 12.9 (*) 4.0 - 10.5 K/uL   RBC 4.89  4.22 - 5.81 MIL/uL   Hemoglobin 16.3  13.0 - 17.0 g/dL   HCT 40.9  81.1 - 91.4 %   MCV 94.7  78.0 - 100.0 fL   MCH  33.3  26.0 - 34.0 pg   MCHC 35.2  30.0 - 36.0 g/dL   RDW 78.2  95.6 - 21.3 %   Platelets 191  150 - 400 K/uL   Neutrophils Relative % 73  43 - 77 %   Neutro Abs 9.4 (*) 1.7 - 7.7 K/uL   Lymphocytes Relative 16  12 - 46 %   Lymphs Abs 2.1  0.7 - 4.0 K/uL   Monocytes Relative 8  3 - 12 %   Monocytes Absolute 1.0  0.1 - 1.0 K/uL  Eosinophils Relative 3  0 - 5 %   Eosinophils Absolute 0.3  0.0 - 0.7 K/uL   Basophils Relative 0  0 - 1 %   Basophils Absolute 0.0  0.0 - 0.1 K/uL  BASIC METABOLIC PANEL     Status: Abnormal   Collection Time    06/01/13  2:40 AM      Result Value Range   Sodium 138  135 - 145 mEq/L   Potassium 3.8  3.5 - 5.1 mEq/L   Chloride 102  96 - 112 mEq/L   CO2 26  19 - 32 mEq/L   Glucose, Bld 127 (*) 70 - 99 mg/dL   BUN 15  6 - 23 mg/dL   Creatinine, Ser 4.09  0.50 - 1.35 mg/dL   Calcium 9.3  8.4 - 81.1 mg/dL   GFR calc non Af Amer 84 (*) >90 mL/min   GFR calc Af Amer >90  >90 mL/min   Comment:            The eGFR has been calculated     using the CKD EPI equation.     This calculation has not been     validated in all clinical     situations.     eGFR's persistently     <90 mL/min signify     possible Chronic Kidney Disease.    CT ABDOMEN AND PELVIS WITH CONTRAST    Findings:  Lung Bases: Unremarkable.  Abdomen/Pelvis: The appearance of the liver, gallbladder,  pancreas, spleen, bilateral adrenal glands and the right kidney is  unremarkable. A 12 mm exophytic intermediate attenuation (41 HU)  lesion in the interpolar region of the left kidney is  indeterminate. No significant volume of ascites, no  pneumoperitoneum and no pathologic distension of small bowel.  Normal appendix. No definite pathologic lymphadenopathy identified  within the abdomen or pelvis. Prostate gland and urinary bladder  are unremarkable in appearance.  Musculoskeletal: There are no aggressive appearing lytic or blastic  lesions noted in the visualized portions of the  skeleton. Focal  area of skin thickening (image 20 of series 2) in the midline of  the upper abdomen with underlying stranding in the adjacent  subcutaneous fat.   IMPRESSION:  1. No acute findings in the abdomen or pelvis to account for the  patient's symptoms.  2. Focal area of skin thickening and stranding in the underlying  subcutaneous fat in the epigastric region in the midline may  suggest cellulitis. No underlying abscess identified at this time.  3. 12 mm indeterminate lesion in the left kidney may simply  represent a proteinaceous cyst. However, further evaluation with a  nonemergent renal ultrasound is recommended to exclude the  possibility of a small solid neoplasm.  4. Normal appendix.  Assessment/Plan: Abdominal wall cellulitis Given Rx for Doxycycline and Cephalexin in the ED.  Patient instructed to take all antibiotics as prescribed.  Educated on alarm symptoms.  Abnormal CT scan, kidney Renal function normal with today's labs in ED.  Patient set up for Renal US this evening.  Will call patient with results and proceed from there.

## 2013-06-01 NOTE — ED Notes (Signed)
Small area to abd causing pain- pt has hx of cellulitis

## 2013-06-02 ENCOUNTER — Ambulatory Visit (HOSPITAL_COMMUNITY)
Admission: RE | Admit: 2013-06-02 | Discharge: 2013-06-02 | Disposition: A | Discharge status: Home / Self Care | Payer: BC Managed Care – PPO | Source: Ambulatory Visit | Attending: Physician Assistant | Admitting: Physician Assistant

## 2013-06-02 ENCOUNTER — Telehealth: Payer: Self-pay | Admitting: Physician Assistant

## 2013-06-02 ENCOUNTER — Other Ambulatory Visit: Payer: Self-pay | Admitting: Physician Assistant

## 2013-06-02 ENCOUNTER — Other Ambulatory Visit: Payer: Self-pay | Admitting: *Deleted

## 2013-06-02 DIAGNOSIS — N2889 Other specified disorders of kidney and ureter: Secondary | ICD-10-CM

## 2013-06-02 DIAGNOSIS — N289 Disorder of kidney and ureter, unspecified: Secondary | ICD-10-CM | POA: Insufficient documentation

## 2013-06-02 MED ORDER — GADOBENATE DIMEGLUMINE 529 MG/ML IV SOLN
20.0000 mL | Freq: Once | INTRAVENOUS | Status: AC | PRN
  Administered 2013-06-02: 20 mL via INTRAVENOUS

## 2013-06-02 NOTE — Telephone Encounter (Signed)
Patient is requesting ultrasound results from last night.

## 2013-06-02 NOTE — Progress Notes (Signed)
Pt informed of MRI approved via Insurance & scheduled at [facility] at 7:00p this evening, 08.13.14, and to arrive at 6:45p with no prep; spoke with pt & girlfriend, Harriett Sine, on HIPAA; explained in detail Radiology report & what MRI was & how it works at their request/SLS

## 2013-06-02 NOTE — Telephone Encounter (Signed)
See 08.13.14 phone note/SLS

## 2013-06-03 ENCOUNTER — Other Ambulatory Visit: Payer: Self-pay | Admitting: Physician Assistant

## 2013-06-03 DIAGNOSIS — N2889 Other specified disorders of kidney and ureter: Secondary | ICD-10-CM

## 2013-06-04 ENCOUNTER — Ambulatory Visit (INDEPENDENT_AMBULATORY_CARE_PROVIDER_SITE_OTHER): Payer: BC Managed Care – PPO | Admitting: Urology

## 2013-06-04 DIAGNOSIS — M545 Low back pain: Secondary | ICD-10-CM

## 2013-06-04 DIAGNOSIS — N289 Disorder of kidney and ureter, unspecified: Secondary | ICD-10-CM

## 2013-06-11 ENCOUNTER — Ambulatory Visit (INDEPENDENT_AMBULATORY_CARE_PROVIDER_SITE_OTHER): Payer: BC Managed Care – PPO | Admitting: Physician Assistant

## 2013-06-11 ENCOUNTER — Encounter: Payer: Self-pay | Admitting: Physician Assistant

## 2013-06-11 VITALS — BP 128/94 | HR 92 | Temp 98.3°F | Resp 16 | Wt 253.0 lb

## 2013-06-11 DIAGNOSIS — Z720 Tobacco use: Secondary | ICD-10-CM

## 2013-06-11 DIAGNOSIS — F17200 Nicotine dependence, unspecified, uncomplicated: Secondary | ICD-10-CM

## 2013-06-11 DIAGNOSIS — L02219 Cutaneous abscess of trunk, unspecified: Secondary | ICD-10-CM

## 2013-06-11 DIAGNOSIS — L03311 Cellulitis of abdominal wall: Secondary | ICD-10-CM

## 2013-06-11 DIAGNOSIS — L0291 Cutaneous abscess, unspecified: Secondary | ICD-10-CM

## 2013-06-11 DIAGNOSIS — N529 Male erectile dysfunction, unspecified: Secondary | ICD-10-CM

## 2013-06-11 DIAGNOSIS — L0390 Cellulitis, unspecified: Secondary | ICD-10-CM

## 2013-06-11 DIAGNOSIS — Z91030 Bee allergy status: Secondary | ICD-10-CM

## 2013-06-11 DIAGNOSIS — Z91038 Other insect allergy status: Secondary | ICD-10-CM

## 2013-06-11 MED ORDER — EPINEPHRINE 0.3 MG/0.3ML IJ SOAJ: 0.3000 mg | Freq: Once | INTRAMUSCULAR | Status: DC

## 2013-06-11 MED ORDER — TADALAFIL 20 MG PO TABS: 10.0000 mg | ORAL_TABLET | ORAL | Status: DC | PRN

## 2013-06-11 MED ORDER — CLINDAMYCIN HCL 300 MG PO CAPS: 300.0000 mg | ORAL_CAPSULE | Freq: Three times a day (TID) | ORAL | Status: DC

## 2013-06-11 NOTE — Assessment & Plan Note (Signed)
Patient counseled on smoking cessation.  Patient wishes to try methods other than pills.  Suggested trial of Nicoderm CQ OTC.  Will follow-up with patient in 4-6 wks.

## 2013-06-11 NOTE — Assessment & Plan Note (Signed)
Refill patient's Rx for Cialis.

## 2013-06-11 NOTE — Assessment & Plan Note (Signed)
Subsiding erythema, patient still with firm, tender region on abdomen.  Previous CT showed evidence of cellulitis with no underlying abscess.  Rx for Clindamycin.  Follow-up in 1 week.  If pain/tenderness persists, patient will need further workup and possible referral.

## 2013-06-11 NOTE — Progress Notes (Signed)
Patient ID: [name], male   DOB: [birth year], 37 y.o.   MRN: [id number]  Patient presents to clinic today c/o continual pain over abdomen despite abx for cellulitis.  Information was obtained from the patient.  Patient was seen in ED on 06/01/13 and diagnosed with abdominal cellulitis.  CT showed no underlying abscess.  Patient was treated with cephalexin and doxycycline for 7 days.  Patient states most of the redness has subsided but he is still having pain.  Pain is localized to area of cellulitis and does not radiate.  Denies drainage from site.  No change in nodule at area of cellulitis.  Patient denies fevers, chills, night sweats.  Endorses an episode of nausea.  Denies C/D, melena or hematochezia.  Last bowel movement was a few hours ago and normal.  Patient also requests refill for Epi-Pen for bee allergy.    Patient also requests refill of Cialis for erectile dysfunction.  Denies history of enlarged prostate or difficulty urinating.  Patient's ED stems most likely from increase stressors in life, including recent divorce, being a single father of 3 kids, and recent diagnosis of RCC requiring partial nephrectomy.  Patient is also interested in smoking cessation due to recent health issues.  He states he wants to be around a long time for his family.  Does not want a oral medication.  Is willing to attempt a trial of Nicoderm CQ.    Past Medical History  Diagnosis Date  . Cellulitis   . Hx of vasectomy   . Chicken pox     Current Outpatient Prescriptions on File Prior to Visit  Medication Sig Dispense Refill  . Aspirin-Acetaminophen (GOODYS BODY PAIN PO) Take by mouth as needed.      . fish oil-omega-3 fatty acids 1000 MG capsule Take 1 g by mouth daily.      Marland Kitchen oxyCODONE-acetaminophen (PERCOCET) 5-325 MG per tablet Take 1 tablet by mouth every 6 (six) hours as needed for pain.  13 tablet  0   No current facility-administered medications on file prior to visit.    Allergies   Allergen Reactions  . Bee Venom Shortness Of Breath and Swelling    Arm swells  . Hornet Venom Shortness Of Breath and Swelling    Arm swells    Family History  Problem Relation Age of Onset  . Cirrhosis Father   . Colitis Father   . Heart disease Father   . Asthma Father   . Heart attack Other     Paternal Grandparents  . Stroke Other     Paternal Grandparents  . Prostate cancer Paternal Grandfather   . Diabetes Maternal Grandfather   . Alcohol abuse Mother   . Other Brother     Intestinal Fissure  . Healthy Sister   . Asthma Son   . Other Father     Chemical Imbalance  . Other Brother     Chemical Imbalance    History   Social History  . Marital Status: Divorced    Spouse Name: N/A    Number of Children: N/A  . Years of Education: N/A   Social History Main Topics  . Smoking status: Current Every Day Smoker    Types: Cigarettes  . Smokeless tobacco: Never Used  . Alcohol Use: No  . Drug Use: No  . Sexual Activity: Yes   Other Topics Concern  . None   Social History Narrative  . None   Review of Systems  Constitutional: Negative for fever,  chills and malaise/fatigue.  Gastrointestinal: Positive for nausea and abdominal pain. Negative for vomiting, diarrhea and constipation.  Genitourinary: Negative for dysuria, urgency, frequency, hematuria and flank pain.  Musculoskeletal: Positive for back pain.   Filed Vitals:   06/11/13 1541  BP: 128/94  Pulse: 92  Temp: 98.3 F (36.8 C)  Resp: 16   Physical Exam  Vitals reviewed. Constitutional: He is oriented to person, place, and time and well-developed, well-nourished, and in no distress.  HENT:  Head: Normocephalic and atraumatic.  Eyes: Pupils are equal, round, and reactive to light.  Cardiovascular: Normal rate, regular rhythm, normal heart sounds and intact distal pulses.   Abdominal: Soft. Bowel sounds are normal. He exhibits no distension and no mass. There is no rebound and no guarding.   Patient has positive tenderness to light palpation over area of subsiding cellulitis.  Neurological: He is alert and oriented to person, place, and time.  Skin: Skin is warm and dry.  Presence of a firm 1 cm mass on central abdomen, at area of abdominal cellulitis, without fluctuation.  Unchanged from previous examination.   Recent Results (from the past 2160 hour(s))  URINALYSIS, ROUTINE W REFLEX MICROSCOPIC     Status: None   Collection Time    06/01/13  2:29 AM      Result Value Range   Color, Urine YELLOW  YELLOW   APPearance CLEAR  CLEAR   Specific Gravity, Urine 1.009  1.005 - 1.030   pH 6.5  5.0 - 8.0   Glucose, UA NEGATIVE  NEGATIVE mg/dL   Hgb urine dipstick NEGATIVE  NEGATIVE   Bilirubin Urine NEGATIVE  NEGATIVE   Ketones, ur NEGATIVE  NEGATIVE mg/dL   Protein, ur NEGATIVE  NEGATIVE mg/dL   Urobilinogen, UA 0.2  0.0 - 1.0 mg/dL   Nitrite NEGATIVE  NEGATIVE   Leukocytes, UA NEGATIVE  NEGATIVE   Comment: MICROSCOPIC NOT DONE ON URINES WITH NEGATIVE PROTEIN, BLOOD, LEUKOCYTES, NITRITE, OR GLUCOSE <1000 mg/dL.  CBC WITH DIFFERENTIAL     Status: Abnormal   Collection Time    06/01/13  2:40 AM      Result Value Range   WBC 12.9 (*) 4.0 - 10.5 K/uL   RBC 4.89  4.22 - 5.81 MIL/uL   Hemoglobin 16.3  13.0 - 17.0 g/dL   HCT 10.9  60.4 - 54.0 %   MCV 94.7  78.0 - 100.0 fL   MCH 33.3  26.0 - 34.0 pg   MCHC 35.2  30.0 - 36.0 g/dL   RDW 98.1  19.1 - 47.8 %   Platelets 191  150 - 400 K/uL   Neutrophils Relative % 73  43 - 77 %   Neutro Abs 9.4 (*) 1.7 - 7.7 K/uL   Lymphocytes Relative 16  12 - 46 %   Lymphs Abs 2.1  0.7 - 4.0 K/uL   Monocytes Relative 8  3 - 12 %   Monocytes Absolute 1.0  0.1 - 1.0 K/uL   Eosinophils Relative 3  0 - 5 %   Eosinophils Absolute 0.3  0.0 - 0.7 K/uL   Basophils Relative 0  0 - 1 %   Basophils Absolute 0.0  0.0 - 0.1 K/uL  BASIC METABOLIC PANEL     Status: Abnormal   Collection Time    06/01/13  2:40 AM      Result Value Range   Sodium 138   135 - 145 mEq/L   Potassium 3.8  3.5 - 5.1 mEq/L  Chloride 102  96 - 112 mEq/L   CO2 26  19 - 32 mEq/L   Glucose, Bld 127 (*) 70 - 99 mg/dL   BUN 15  6 - 23 mg/dL   Creatinine, Ser 1.61  0.50 - 1.35 mg/dL   Calcium 9.3  8.4 - 09.6 mg/dL   GFR calc non Af Amer 84 (*) >90 mL/min   GFR calc Af Amer >90  >90 mL/min   Comment:            The eGFR has been calculated     using the CKD EPI equation.     This calculation has not been     validated in all clinical     situations.     eGFR's persistently     <90 mL/min signify     possible Chronic Kidney Disease.   CT ABDOMEN AND PELVIS WITH CONTRAST  Technique: Multidetector CT imaging of the abdomen and pelvis was  performed following the standard protocol during bolus  administration of intravenous contrast.  Contrast: 50mL OMNIPAQUE IOHEXOL 300 MG/ML SOLN, OMNIPAQUE  IOHEXOL 300 MG/ML SOLN  Comparison: CT of the pelvis 10/17/2012.  Findings:  Lung Bases: Unremarkable.  Abdomen/Pelvis: The appearance of the liver, gallbladder,  pancreas, spleen, bilateral adrenal glands and the right kidney is  unremarkable. A 12 mm exophytic intermediate attenuation (41 HU)  lesion in the interpolar region of the left kidney is  indeterminate. No significant volume of ascites, no  pneumoperitoneum and no pathologic distension of small bowel.  Normal appendix. No definite pathologic lymphadenopathy identified  within the abdomen or pelvis. Prostate gland and urinary bladder  are unremarkable in appearance.  Musculoskeletal: There are no aggressive appearing lytic or blastic  lesions noted in the visualized portions of the skeleton. Focal  area of skin thickening (image 20 of series 2) in the midline of  the upper abdomen with underlying stranding in the adjacent  subcutaneous fat.  IMPRESSION:  1. No acute findings in the abdomen or pelvis to account for the  patient's symptoms.  2. Focal area of skin thickening and stranding in the  underlying  subcutaneous fat in the epigastric region in the midline may  suggest cellulitis. No underlying abscess identified at this time.  3. 12 mm indeterminate lesion in the left kidney may simply  represent a proteinaceous cyst. However, further evaluation with a  nonemergent renal ultrasound is recommended to exclude the  possibility of a small solid neoplasm.  4. Normal appendix.  Assessment/Plan: Erectile dysfunction Refill patient's Rx for Cialis.  Tobacco abuse Patient counseled on smoking cessation.  Patient wishes to try methods other than pills.  Suggested trial of Nicoderm CQ OTC.  Will follow-up with patient in 4-6 wks.  Bee allergy status Rx refill Epi Pen  Abdominal wall cellulitis Subsiding erythema, patient still with firm, tender region on abdomen.  Previous CT showed evidence of cellulitis with no underlying abscess.  Rx for Clindamycin.  Follow-up in 1 week.  If pain/tenderness persists, patient will need further workup and possible referral.

## 2013-06-11 NOTE — Patient Instructions (Signed)
Please take antibiotic three times a day, until all pills are gone.  Please take with probiotic supplement -- activia or pill.  Rx for cialis.  Nicoderm CQ OTC.  I need to see you in 1 week to reassess cellulitis.  Please call if you need [place].   Cellulitis Cellulitis is an infection of the skin and the tissue beneath it. The infected area is usually red and tender. Cellulitis occurs most often in the arms and lower legs.  CAUSES  Cellulitis is caused by bacteria that enter the skin through cracks or cuts in the skin. The most common types of bacteria that cause cellulitis are Staphylococcus and Streptococcus. SYMPTOMS   Redness and warmth.  Swelling.  Tenderness or pain.  Fever. DIAGNOSIS  Your caregiver can usually determine what is wrong based on a physical exam. Blood tests may also be done. TREATMENT  Treatment usually involves taking an antibiotic medicine. HOME CARE INSTRUCTIONS   Take your antibiotics as directed. Finish them even if you start to feel better.  Keep the infected arm or leg elevated to reduce swelling.  Apply a warm cloth to the affected area up to 4 times per day to relieve pain.  Only take over-the-counter or prescription medicines for pain, discomfort, or fever as directed by your caregiver.  Keep all follow-up appointments as directed by your caregiver. SEEK MEDICAL CARE IF:   You notice red streaks coming from the infected area.  Your red area gets larger or turns dark in color.  Your bone or joint underneath the infected area becomes painful after the skin has healed.  Your infection returns in the same area or another area.  You notice a swollen bump in the infected area.  You develop new symptoms. SEEK IMMEDIATE MEDICAL CARE IF:   You have a fever.  You feel very sleepy.  You develop vomiting or diarrhea.  You have a general ill feeling (malaise) with muscle aches and pains. MAKE SURE YOU:   Understand these instructions.  Will  watch your condition.  Will get help right away if you are not doing well or get worse. Document Released: 07/17/2005 Document Revised: 04/07/2012 Document Reviewed: 12/23/2011 [facility] Patient Information 2014 [place], [place].

## 2013-06-11 NOTE — Assessment & Plan Note (Signed)
Rx refill Epi Pen

## 2013-06-15 ENCOUNTER — Other Ambulatory Visit: Payer: Self-pay | Admitting: Urology

## 2013-06-15 ENCOUNTER — Ambulatory Visit (HOSPITAL_COMMUNITY)
Admission: RE | Admit: 2013-06-15 | Discharge: 2013-06-15 | Disposition: A | Discharge status: Home / Self Care | Payer: BC Managed Care – PPO | Source: Ambulatory Visit | Attending: Urology | Admitting: Urology

## 2013-06-15 ENCOUNTER — Other Ambulatory Visit (HOSPITAL_COMMUNITY): Payer: Self-pay | Admitting: Urology

## 2013-06-15 DIAGNOSIS — D4959 Neoplasm of unspecified behavior of other genitourinary organ: Secondary | ICD-10-CM

## 2013-06-15 DIAGNOSIS — F17200 Nicotine dependence, unspecified, uncomplicated: Secondary | ICD-10-CM | POA: Insufficient documentation

## 2013-06-16 ENCOUNTER — Encounter (HOSPITAL_COMMUNITY): Payer: Self-pay

## 2013-06-16 ENCOUNTER — Encounter (HOSPITAL_COMMUNITY): Payer: Self-pay | Admitting: Pharmacy Technician

## 2013-06-16 ENCOUNTER — Encounter (HOSPITAL_COMMUNITY)
Admission: RE | Admit: 2013-06-16 | Discharge: 2013-06-16 | Disposition: A | Discharge status: Home / Self Care | Payer: BC Managed Care – PPO | Source: Ambulatory Visit | Attending: Urology | Admitting: Urology

## 2013-06-16 LAB — BASIC METABOLIC PANEL
BUN: 13 mg/dL (ref 6–23)
CO2: 24 mEq/L (ref 19–32)
Calcium: 8.9 mg/dL (ref 8.4–10.5)
Creatinine, Ser: 0.87 mg/dL (ref 0.50–1.35)
Glucose, Bld: 121 mg/dL — ABNORMAL HIGH (ref 70–99)

## 2013-06-16 LAB — CBC
HCT: 44.7 % (ref 39.0–52.0)
Hemoglobin: 15.5 g/dL (ref 13.0–17.0)
MCH: 33 pg (ref 26.0–34.0)
MCHC: 34.7 g/dL (ref 30.0–36.0)
RDW: 12.5 % (ref 11.5–15.5)

## 2013-06-16 NOTE — Progress Notes (Signed)
Chest x-ray 06/15/13 on EPIC, CMET results 06/15/13 on chart

## 2013-06-16 NOTE — Patient Instructions (Addendum)
[name]  06/16/2013   Your procedure is scheduled on: 06/17/13  Report to [facility] at 5:15 AM.  Call this number if you have problems the morning of surgery [phone]: [phone]   Remember:   Do not eat food or drink liquids After Midnight.     Take these medicines the morning of surgery with A SIP OF WATER: clindamycin   Do not wear jewelry, make-up or nail polish.  Do not wear lotions, powders, or perfumes. You may wear deodorant.  Do not shave 48 hours prior to surgery. Men may shave face and neck.  Do not bring valuables to the hospital.  Contacts, dentures or bridgework may not be worn into surgery.  Leave suitcase in the car. After surgery it may be brought to your room.  For patients admitted to the hospital, checkout time is 11:00 AM the day of discharge.    Please read over the following fact sheets that you were given: incentive spirometry fact sheet, blood fact sheet Birdie Sons, RN  pre op nurse call if needed [phone]    FAILURE TO FOLLOW THESE INSTRUCTIONS MAY RESULT IN CANCELLATION OF YOUR SURGERY   Patient Signature: ___________________________________________

## 2013-06-16 NOTE — H&P (Signed)
Chief Complaint  Left renal neoplasm   Reason For Visit  Reason for consult: To discuss minimally invasive surgery for left renal mass Physician requesting consult: Dr. Bjorn Pippin PCP: Piedad Climes, PA-C   History of Present Illness  [name] is a 37 year old with a history of cellulitis of his abdominal wall and left thigh requiring multiple courses of antibiotics and I and D. He underwent a CT of the abdomen and pelvis with contrast on 06/01/13 for further evaluation of abdominal wall cellulitis which incidentally noted a 12 mm hyperattenuating exophytic mass of the lateral interpolar region of the left kidney.  An ultrasound was then performed on the same day but the kidney was not well visualized due to overlying bowel gas. He had an MRI of the abdomen on 06/02/13 which confirmed an enhancing 12 x 12 mm mass with restricted diffusion off the lateral aspect exophytically in the interpolar region of the left kidney.  There was no evidence of regional lymphadenopathy, no renal vein/IVC involvement, no contralateral renal masses, no adrenal abnormalities, and no evidence of abdominal metastatses. He does have chronic pain issues with left flank pain and upper abdominal pain and has been on pain medication for this.  He is unsure which antibiotic he is currently taking for his cellulitis.  Currently, most of his abdominal cellulitis symptoms have resolved although he still has some pain under his upper midline abdomen.  Baseline renal function: Cr 1.1, eGFR > 60 /ml/min    Past Medical History Problems  1. History of  Cellulitis 682.9  Surgical History Problems  1. History of  Elective Circumcision V50.2 2. History of  Hand Surgery 3. Surgery Of Male Genitalia Vasectomy V25.2  Current Meds 1. Cephalexin CAPS; Therapy: (Recorded:15Aug2014) to 2. Doxycycline Hyclate TABS; Therapy: (Recorded:15Aug2014) to 3. Oxycodone-Acetaminophen 5-325 MG Oral Tablet; take 1 or 2 tablets q 4-6 hours prn  pain; Last  Rx:15Aug2014  Allergies Medication  1. No Known Drug Allergies Non-Medication  2. Bee sting  Family History Problems  1. Paternal history of  Cirrhosis 2. Family history of  Cirrhosis 3. Family history of  Death In The Family Father age 29 of cirrhosis 4. Family history of  Family Health Status Number Of Children 2 daughters 1 son 5. Family history of  Prostate Cancer V16.42  Social History Problems    Alcohol Use 2   Marital History - Divorced V61.03   Occupation: Dealer   Tobacco Use V15.82 smokes 1 1/2 ppd x 22 years  Review of Systems Genitourinary, constitutional, skin, eye, otolaryngeal, hematologic/lymphatic, cardiovascular, pulmonary, endocrine, musculoskeletal, gastrointestinal, neurological and psychiatric system(s) were reviewed and pertinent findings if present are noted.  Genitourinary: no hematuria.  Constitutional: no fever and no recent weight loss.  Cardiovascular: no chest pain and no leg swelling.  Respiratory: no shortness of breath.    Vitals Vital Signs [Data Includes: Last 1 Day]  26Aug2014 02:12PM  Blood Pressure: 150 / 83 Heart Rate: 97  Physical Exam Constitutional: Well nourished and well developed . No acute distress.  ENT:. The ears and nose are normal in appearance.  Neck: The appearance of the neck is normal and no neck mass is present.  Pulmonary: No respiratory distress, normal respiratory rhythm and effort and clear bilateral breath sounds.  Cardiovascular: Heart rate and rhythm are normal . No peripheral edema.  Abdomen: The abdomen is soft and nontender. No masses are palpated. No CVA tenderness. No hernias are palpable. No hepatosplenomegaly noted. There  is no apparent erythema or skin related infection.  Lymphatics: The femoral and inguinal nodes are not enlarged or tender.  Skin: Normal skin turgor, no visible rash and no visible skin lesions.  Neuro/Psych:. Mood and affect are  appropriate.    Results/Data Urine [Data Includes: Last 1 Day]   26Aug2014  COLOR YELLOW   APPEARANCE CLOUDY   SPECIFIC GRAVITY 1.020   pH 7.0   GLUCOSE NEG mg/dL  BILIRUBIN NEG   KETONE TRACE mg/dL  BLOOD NEG   PROTEIN NEG mg/dL  UROBILINOGEN 1 mg/dL  NITRITE NEG   LEUKOCYTE ESTERASE NEG   SQUAMOUS EPITHELIAL/HPF NONE SEEN   WBC NONE SEEN WBC/hpf  RBC NONE SEEN RBC/hpf  BACTERIA NONE SEEN   CRYSTALS NONE SEEN   CASTS NONE SEEN   Other OCCASIONAL AMORPHOUS MATERIAL     I have independently reviewed his CT scan with contrast as well as his recent MRI of the abdomen with and without contrast.  Findings are as dictated above.   Assessment Assessed  1. 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 UA With REFLEX  Done: 26Aug2014 02:12PM Renal Neoplasm (239.5)  2. CHEST X-RAY  Requested for: 26Aug2014 3. COMPREHENSIVE METABOLIC PANEL  Done: 26Aug2014 4. Follow-up Schedule Surgery Office  Follow-up  Requested for: 26Aug2014  Discussion/Summary  1.  Left renal neoplasm concerning for malignancy: I recommended that he complete his metastatic evaluation including chest imaging and laboratory studies today.   The patient was provided information regarding their renal mass including the relative risk of benign versus malignant pathology and the natural history of renal cell carcinoma and other possible malignancies of the kidney. The role of renal biopsy, laboratory testing, and imaging studies to further characterize renal masses and/or the presence of metastatic disease were explained. We discussed the role of active surveillance, surgical therapy with both radical nephrectomy and nephron-sparing surgery, and ablative therapy in the treatment of renal masses. In addition, we discussed our goals of providing an accurate diagnosis and oncologic control while maintaining optimal renal function as appropriate based on the size, location, and complexity of their renal  mass as well as their co-morbidities.    We have discussed the risks of treatment in detail including but not limited to bleeding, infection, heart attack, stroke, death, venothromoboembolism, cancer recurrence, injury/damage to surrounding organs and structures, urine leak, the possibility of open surgical conversion for patients undergoing minimally invasive surgery, the risk of developing chronic kidney disease and its associated implications, and the potential risk of end stage renal disease possibly necessitating dialysis.   He would like to proceed with a minimally invasive left robotic-assisted laparoscopic partial nephrectomy is recommended.  This will be scheduled for the near future.  He will receive IV vancomycin preoperatively considering his history of skin cellulitis without definite cultures to direct antibiotic therapy.  We did discuss the potential increased risk of wound infection and will take appropriate precautions to help try to prevent this.  Cc: Dr. Bjorn Pippin Piedad Climes, PA-C    SignaturesElectronically signed by : Heloise Purpura, M.D.; Jun 15 2013  3:08PM

## 2013-06-17 ENCOUNTER — Encounter (HOSPITAL_COMMUNITY)
Admission: RE | Disposition: A | Discharge status: Home / Self Care | Payer: Self-pay | Source: Ambulatory Visit | Attending: Urology

## 2013-06-17 ENCOUNTER — Inpatient Hospital Stay (HOSPITAL_COMMUNITY)
Admission: RE | Admit: 2013-06-17 | Discharge: 2013-06-19 | DRG: 303 | Disposition: A | Discharge status: Home / Self Care | Payer: BC Managed Care – PPO | Source: Ambulatory Visit | Attending: Urology | Admitting: Urology

## 2013-06-17 ENCOUNTER — Inpatient Hospital Stay (HOSPITAL_COMMUNITY): Payer: BC Managed Care – PPO | Admitting: Anesthesiology

## 2013-06-17 ENCOUNTER — Encounter (HOSPITAL_COMMUNITY): Payer: Self-pay | Admitting: Anesthesiology

## 2013-06-17 ENCOUNTER — Encounter (HOSPITAL_COMMUNITY): Payer: Self-pay | Admitting: *Deleted

## 2013-06-17 DIAGNOSIS — N2889 Other specified disorders of kidney and ureter: Secondary | ICD-10-CM | POA: Diagnosis present

## 2013-06-17 DIAGNOSIS — C649 Malignant neoplasm of unspecified kidney, except renal pelvis: Principal | ICD-10-CM | POA: Diagnosis present

## 2013-06-17 DIAGNOSIS — R93429 Abnormal radiologic findings on diagnostic imaging of unspecified kidney: Secondary | ICD-10-CM

## 2013-06-17 DIAGNOSIS — Z87891 Personal history of nicotine dependence: Secondary | ICD-10-CM

## 2013-06-17 DIAGNOSIS — Z79899 Other long term (current) drug therapy: Secondary | ICD-10-CM

## 2013-06-17 HISTORY — PX: ROBOTIC ASSITED PARTIAL NEPHRECTOMY: SHX6087

## 2013-06-17 LAB — BASIC METABOLIC PANEL
BUN: 11 mg/dL (ref 6–23)
CO2: 27 mEq/L (ref 19–32)
Chloride: 102 mEq/L (ref 96–112)
Creatinine, Ser: 1.12 mg/dL (ref 0.50–1.35)
Glucose, Bld: 116 mg/dL — ABNORMAL HIGH (ref 70–99)

## 2013-06-17 LAB — TYPE AND SCREEN: Antibody Screen: NEGATIVE

## 2013-06-17 SURGERY — ROBOTIC ASSITED PARTIAL NEPHRECTOMY
Anesthesia: General | Site: Abdomen | Laterality: Left | Wound class: Clean

## 2013-06-17 MED ORDER — CISATRACURIUM BESYLATE (PF) 10 MG/5ML IV SOLN
INTRAVENOUS | Status: DC | PRN
  Administered 2013-06-17: 4 mg via INTRAVENOUS
  Administered 2013-06-17: 12 mg via INTRAVENOUS

## 2013-06-17 MED ORDER — SIMETHICONE 80 MG PO CHEW
80.0000 mg | CHEWABLE_TABLET | Freq: Four times a day (QID) | ORAL | Status: DC | PRN
  Administered 2013-06-18: 80 mg via ORAL
  Filled 2013-06-17: qty 1

## 2013-06-17 MED ORDER — BUPIVACAINE LIPOSOME 1.3 % IJ SUSP
20.0000 mL | Freq: Once | INTRAMUSCULAR | Status: DC
  Filled 2013-06-17: qty 20

## 2013-06-17 MED ORDER — STERILE WATER FOR IRRIGATION IR SOLN
Status: DC | PRN
  Administered 2013-06-17: 3000 mL

## 2013-06-17 MED ORDER — MANNITOL 25 % IV SOLN
25.0000 g | Freq: Once | INTRAVENOUS | Status: AC
  Administered 2013-06-17: 12.5 g via INTRAVENOUS
  Filled 2013-06-17: qty 100

## 2013-06-17 MED ORDER — PROPOFOL 10 MG/ML IV BOLUS
INTRAVENOUS | Status: DC | PRN
  Administered 2013-06-17: 200 mg via INTRAVENOUS

## 2013-06-17 MED ORDER — HYDROMORPHONE HCL PF 1 MG/ML IJ SOLN
INTRAMUSCULAR | Status: AC
  Filled 2013-06-17: qty 1

## 2013-06-17 MED ORDER — FENTANYL CITRATE 0.05 MG/ML IJ SOLN
INTRAMUSCULAR | Status: AC
  Filled 2013-06-17: qty 2

## 2013-06-17 MED ORDER — NEOSTIGMINE METHYLSULFATE 1 MG/ML IJ SOLN
INTRAMUSCULAR | Status: DC | PRN
  Administered 2013-06-17: 4 mg via INTRAVENOUS

## 2013-06-17 MED ORDER — SUCCINYLCHOLINE CHLORIDE 20 MG/ML IJ SOLN
INTRAMUSCULAR | Status: DC | PRN
  Administered 2013-06-17: 100 mg via INTRAVENOUS

## 2013-06-17 MED ORDER — SODIUM CHLORIDE 0.9 % IV SOLN
INTRAVENOUS | Status: DC | PRN
  Administered 2013-06-17: 07:00:00 via INTRAVENOUS

## 2013-06-17 MED ORDER — DEXTROSE-NACL 5-0.45 % IV SOLN
INTRAVENOUS | Status: DC
  Administered 2013-06-17 – 2013-06-18 (×4): via INTRAVENOUS

## 2013-06-17 MED ORDER — GLYCOPYRROLATE 0.2 MG/ML IJ SOLN
INTRAMUSCULAR | Status: DC | PRN
  Administered 2013-06-17: .6 mg via INTRAVENOUS

## 2013-06-17 MED ORDER — SUFENTANIL CITRATE 50 MCG/ML IV SOLN
INTRAVENOUS | Status: DC | PRN
  Administered 2013-06-17 (×2): 10 ug via INTRAVENOUS
  Administered 2013-06-17: 20 ug via INTRAVENOUS
  Administered 2013-06-17: 10 ug via INTRAVENOUS

## 2013-06-17 MED ORDER — ACETAMINOPHEN 10 MG/ML IV SOLN: 1000.0000 mg | Freq: Once | INTRAVENOUS | Status: DC

## 2013-06-17 MED ORDER — HYDROMORPHONE HCL PF 1 MG/ML IJ SOLN
0.2500 mg | INTRAMUSCULAR | Status: DC | PRN
  Administered 2013-06-17 (×4): 0.5 mg via INTRAVENOUS

## 2013-06-17 MED ORDER — LACTATED RINGERS IV SOLN: INTRAVENOUS | Status: DC

## 2013-06-17 MED ORDER — LACTATED RINGERS IV SOLN
INTRAVENOUS | Status: DC | PRN
  Administered 2013-06-17 (×2): via INTRAVENOUS

## 2013-06-17 MED ORDER — ONDANSETRON HCL 4 MG/2ML IJ SOLN
INTRAMUSCULAR | Status: DC | PRN
  Administered 2013-06-17: 4 mg via INTRAVENOUS

## 2013-06-17 MED ORDER — LIDOCAINE HCL (CARDIAC) 20 MG/ML IV SOLN
INTRAVENOUS | Status: DC | PRN
  Administered 2013-06-17: 100 mg via INTRAVENOUS

## 2013-06-17 MED ORDER — DEXAMETHASONE SODIUM PHOSPHATE 10 MG/ML IJ SOLN
INTRAMUSCULAR | Status: DC | PRN
  Administered 2013-06-17: 10 mg via INTRAVENOUS

## 2013-06-17 MED ORDER — NICOTINE 21 MG/24HR TD PT24
21.0000 mg | MEDICATED_PATCH | Freq: Every day | TRANSDERMAL | Status: DC
  Administered 2013-06-17 – 2013-06-19 (×3): 21 mg via TRANSDERMAL
  Filled 2013-06-17 (×3): qty 1

## 2013-06-17 MED ORDER — VANCOMYCIN HCL 10 G IV SOLR
1500.0000 mg | INTRAVENOUS | Status: AC
  Administered 2013-06-17: 1500 mg via INTRAVENOUS
  Filled 2013-06-17: qty 1500

## 2013-06-17 MED ORDER — DIPHENHYDRAMINE HCL 50 MG/ML IJ SOLN: 12.5000 mg | Freq: Four times a day (QID) | INTRAMUSCULAR | Status: DC | PRN

## 2013-06-17 MED ORDER — ZOLPIDEM TARTRATE 5 MG PO TABS: 5.0000 mg | ORAL_TABLET | Freq: Every evening | ORAL | Status: DC | PRN

## 2013-06-17 MED ORDER — DIPHENHYDRAMINE HCL 12.5 MG/5ML PO ELIX: 12.5000 mg | ORAL_SOLUTION | Freq: Four times a day (QID) | ORAL | Status: DC | PRN

## 2013-06-17 MED ORDER — LACTATED RINGERS IR SOLN
Status: DC | PRN
  Administered 2013-06-17: 1000 mL

## 2013-06-17 MED ORDER — VANCOMYCIN HCL IN DEXTROSE 1-5 GM/200ML-% IV SOLN
1000.0000 mg | Freq: Two times a day (BID) | INTRAVENOUS | Status: AC
  Administered 2013-06-17: 1000 mg via INTRAVENOUS
  Filled 2013-06-17: qty 200

## 2013-06-17 MED ORDER — FENTANYL CITRATE 0.05 MG/ML IJ SOLN
INTRAMUSCULAR | Status: DC | PRN
  Administered 2013-06-17 (×2): 50 ug via INTRAVENOUS

## 2013-06-17 MED ORDER — MORPHINE SULFATE 2 MG/ML IJ SOLN
2.0000 mg | INTRAMUSCULAR | Status: DC | PRN
  Administered 2013-06-17: 4 mg via INTRAVENOUS
  Administered 2013-06-17: 2 mg via INTRAVENOUS
  Administered 2013-06-17: 4 mg via INTRAVENOUS
  Administered 2013-06-17: 2 mg via INTRAVENOUS
  Administered 2013-06-17 – 2013-06-18 (×4): 4 mg via INTRAVENOUS
  Filled 2013-06-17 (×2): qty 2
  Filled 2013-06-17: qty 1
  Filled 2013-06-17 (×2): qty 2
  Filled 2013-06-17: qty 1
  Filled 2013-06-17 (×2): qty 2

## 2013-06-17 MED ORDER — ONDANSETRON HCL 4 MG/2ML IJ SOLN: 4.0000 mg | INTRAMUSCULAR | Status: DC | PRN

## 2013-06-17 MED ORDER — OXYCODONE-ACETAMINOPHEN 5-325 MG PO TABS
1.0000 | ORAL_TABLET | ORAL | Status: DC | PRN
  Administered 2013-06-18 – 2013-06-19 (×10): 2 via ORAL
  Filled 2013-06-17 (×10): qty 2

## 2013-06-17 MED ORDER — MIDAZOLAM HCL 5 MG/5ML IJ SOLN
INTRAMUSCULAR | Status: DC | PRN
  Administered 2013-06-17: 2 mg via INTRAVENOUS

## 2013-06-17 MED ORDER — DOCUSATE SODIUM 100 MG PO CAPS
100.0000 mg | ORAL_CAPSULE | Freq: Two times a day (BID) | ORAL | Status: DC
  Administered 2013-06-17 – 2013-06-19 (×4): 100 mg via ORAL
  Filled 2013-06-17 (×6): qty 1

## 2013-06-17 MED ORDER — ACETAMINOPHEN 10 MG/ML IV SOLN
1000.0000 mg | Freq: Four times a day (QID) | INTRAVENOUS | Status: DC
  Administered 2013-06-17 – 2013-06-18 (×2): 1000 mg via INTRAVENOUS
  Filled 2013-06-17 (×4): qty 100

## 2013-06-17 MED ORDER — BUPIVACAINE LIPOSOME 1.3 % IJ SUSP
INTRAMUSCULAR | Status: DC | PRN
  Administered 2013-06-17: 20 mL

## 2013-06-17 SURGICAL SUPPLY — 56 items
APPLICATOR SURGIFLO ENDO (HEMOSTASIS) IMPLANT
CHLORAPREP W/TINT 26ML (MISCELLANEOUS) ×2 IMPLANT
CLIP LIGATING HEM O LOK PURPLE (MISCELLANEOUS) ×2 IMPLANT
CLIP LIGATING HEMO O LOK GREEN (MISCELLANEOUS) ×2 IMPLANT
CLOTH BEACON ORANGE TIMEOUT ST (SAFETY) ×2 IMPLANT
CORDS BIPOLAR (ELECTRODE) ×2 IMPLANT
COVER SURGICAL LIGHT HANDLE (MISCELLANEOUS) ×2 IMPLANT
COVER TIP SHEARS 8 DVNC (MISCELLANEOUS) ×1 IMPLANT
COVER TIP SHEARS 8MM DA VINCI (MISCELLANEOUS) ×1
DECANTER SPIKE VIAL GLASS SM (MISCELLANEOUS) IMPLANT
DERMABOND ADVANCED (GAUZE/BANDAGES/DRESSINGS) ×2
DERMABOND ADVANCED .7 DNX12 (GAUZE/BANDAGES/DRESSINGS) ×2 IMPLANT
DRAIN CHANNEL 15F RND FF 3/16 (WOUND CARE) ×2 IMPLANT
DRAPE INCISE IOBAN 66X45 STRL (DRAPES) ×2 IMPLANT
DRAPE LAPAROSCOPIC ABDOMINAL (DRAPES) ×2 IMPLANT
DRAPE LG THREE QUARTER DISP (DRAPES) ×4 IMPLANT
DRAPE TABLE BACK 44X90 PK DISP (DRAPES) ×2 IMPLANT
DRAPE WARM FLUID 44X44 (DRAPE) ×2 IMPLANT
DRESSING SURGICEL FIBRLLR 1X2 (HEMOSTASIS) IMPLANT
DRSG SURGICEL FIBRILLAR 1X2 (HEMOSTASIS)
ELECT REM PT RETURN 9FT ADLT (ELECTROSURGICAL) ×2
ELECTRODE REM PT RTRN 9FT ADLT (ELECTROSURGICAL) ×1 IMPLANT
EVACUATOR SILICONE 100CC (DRAIN) ×2 IMPLANT
GAUZE VASELINE 3X9 (GAUZE/BANDAGES/DRESSINGS) IMPLANT
GLOVE BIO SURGEON STRL SZ 6.5 (GLOVE) ×2 IMPLANT
GLOVE BIOGEL M STRL SZ7.5 (GLOVE) ×4 IMPLANT
GOWN STRL NON-REIN LRG LVL3 (GOWN DISPOSABLE) ×4 IMPLANT
GOWN STRL REIN XL XLG (GOWN DISPOSABLE) ×2 IMPLANT
HEMOSTAT SURGICEL 4X8 (HEMOSTASIS) IMPLANT
KIT ACCESSORY DA VINCI DISP (KITS) ×1
KIT ACCESSORY DVNC DISP (KITS) ×1 IMPLANT
KIT BASIN OR (CUSTOM PROCEDURE TRAY) ×2 IMPLANT
PENCIL BUTTON HOLSTER BLD 10FT (ELECTRODE) ×2 IMPLANT
POSITIONER SURGICAL ARM (MISCELLANEOUS) ×2 IMPLANT
POUCH SPECIMEN RETRIEVAL 10MM (ENDOMECHANICALS) ×2 IMPLANT
SET TUBE IRRIG SUCTION NO TIP (IRRIGATION / IRRIGATOR) ×2 IMPLANT
SOLUTION ANTI FOG 6CC (MISCELLANEOUS) ×2 IMPLANT
SOLUTION ELECTROLUBE (MISCELLANEOUS) ×2 IMPLANT
SPONGE LAP 18X18 X RAY DECT (DISPOSABLE) IMPLANT
SURGIFLO W/THROMBIN 8M KIT (HEMOSTASIS) ×2 IMPLANT
SUT ETHILON 3 0 PS 1 (SUTURE) ×2 IMPLANT
SUT MNCRL AB 4-0 PS2 18 (SUTURE) ×4 IMPLANT
SUT V-LOC BARB 180 2/0GR6 GS22 (SUTURE) ×2
SUT VIC AB 0 CT1 27 (SUTURE) ×1
SUT VIC AB 0 CT1 27XBRD ANTBC (SUTURE) ×1 IMPLANT
SUT VICRYL 0 UR6 27IN ABS (SUTURE) ×6 IMPLANT
SUT VLOC BARB 180 ABS3/0GR12 (SUTURE) ×2
SUTURE V-LC BRB 180 2/0GR6GS22 (SUTURE) ×1 IMPLANT
SUTURE VLOC BRB 180 ABS3/0GR12 (SUTURE) ×1 IMPLANT
TOWEL OR NON WOVEN STRL DISP B (DISPOSABLE) ×2 IMPLANT
TRAY FOLEY CATH 16FRSI W/METER (SET/KITS/TRAYS/PACK) ×2 IMPLANT
TRAY LAP CHOLE (CUSTOM PROCEDURE TRAY) ×2 IMPLANT
TROCAR ENDOPATH XCEL 12X100 BL (ENDOMECHANICALS) ×2 IMPLANT
TROCAR XCEL 12X100 BLDLESS (ENDOMECHANICALS) ×2 IMPLANT
TUBING INSUFFLATION 10FT LAP (TUBING) ×2 IMPLANT
WATER STERILE IRR 1500ML POUR (IV SOLUTION) IMPLANT

## 2013-06-17 NOTE — Anesthesia Procedure Notes (Signed)
Procedure Name: Intubation Date/Time: 06/17/2013 7:40 AM Performed by: Leroy Libman L Patient Re-evaluated:Patient Re-evaluated prior to inductionOxygen Delivery Method: Circle system utilized Preoxygenation: Pre-oxygenation with 100% oxygen Intubation Type: IV induction Ventilation: Mask ventilation without difficulty and Oral airway inserted - appropriate to patient size Laryngoscope Size: Miller and 3 Grade View: Grade I Tube type: Oral Tube size: 8.0 mm Number of attempts: 1 Airway Equipment and Method: Stylet Placement Confirmation: ETT inserted through vocal cords under direct vision,  positive ETCO2,  CO2 detector and breath sounds checked- equal and bilateral Secured at: 21 cm Tube secured with: Tape Dental Injury: Teeth and Oropharynx as per pre-operative assessment

## 2013-06-17 NOTE — Op Note (Signed)
Preoperative diagnosis: Left renal neoplasm  Postoperative diagnosis: Left renal neoplasm  Procedure:  1. Left robotic-assisted laparoscopic partial nephrectomy  Surgeon: Moody Bruins. M.D.  Assistant(s): Pecola Leisure, PA-C  Anesthesia: General  Complications: None  EBL: 50 mL  IVF:  1900 mL crystalloid  Specimens: 1. Left renal neoplasm 2. Perinephric fat  Disposition of specimens: Pathology  Intraoperative findings:       1. Warm renal ischemia time: 0 minutes  Drains: 1. # 15 Blake perinephric drain  Indication:  [name] is a 37 y.o. year old patient with a left renal neoplasm.  After a thorough review of the management options for their renal mass, they elected to proceed with surgical treatment and the above procedure.  We have discussed the potential benefits and risks of the procedure, side effects of the proposed treatment, the likelihood of the patient achieving the goals of the procedure, and any potential problems that might occur during the procedure or recuperation. Informed consent has been obtained.   Description of procedure:  The patient was taken to the operating room and a general anesthetic was administered. The patient was given preoperative antibiotics, placed in the left modified flank position with care to pad all potential pressure points, and prepped and draped in the usual sterile fashion. Next a preoperative timeout was performed.  A site was selected on the left side of the umbilicus for placement of the camera port. This was placed using a standard open Hassan technique which allowed entry into the peritoneal cavity under direct vision and without difficulty. A 12 mm port was placed and a pneumoperitoneum established. The camera was then used to inspect the abdomen and there was no evidence of any intra-abdominal injuries or other abnormalities. The remaining abdominal ports were then placed. 8 mm robotic ports were placed in  the left upper quadrant, left lower quadrant, and far left lateral abdominal wall. A 12 mm port was placed in the upper midline for laparoscopic assistance. All ports were placed under direct vision without difficulty. The surgical cart was then docked.   Utilizing the cautery scissors, the white line of Toldt was incised allowing the colon to be mobilized medially and the plane between the mesocolon and the anterior layer of Gerota's fascia to be developed and the kidney to be exposed.  The ureter and gonadal vein were identified inferiorly and the ureter was lifted anteriorly off the psoas muscle.  Dissection proceeded superiorly along the gonadal vein until the renal vein was identified.  The renal hilum was then carefully isolated with a combination of blunt and sharp dissection allowing the renal arterial and venous structures to be separated and isolated in preparation for renal hilar vessel clamping. Two renal arteries and a single renal vein were identified.  12.5 g of IV mannitol was then administered.   Attention turned to the kidney and the perinephric fat surrounding the renal mass was removed (and sent for pathologic evaluation) and the kidney was mobilized sufficiently for exposure and resection of the renal mass.   Once the renal mass was properly isolated, preparations were made for resection of the tumor.  Reconstructive sutures were placed into the abdomen for the renorrhaphy portion of the procedure.  The tumor was very exophytic and measured about 1 cm.  It was felt that hilar clamping would likely not be necessary.  The tumor was then excised with cold scissor dissection along with an adequate visible gross margin of normal renal parenchyma. Cautery was used  for hemostasis. The tumor appeared to be excised without any gross violation of the tumor. The renal collecting system was not entered during removal of the tumor.  A running 3-0 V-lock suture was then brought through the capsule of  the kidney and run along the base of the renal defect to provide hemostasis and close any entry into the renal collecting system if present. Weck clips were used to secure this suture outside the renal capsule at the proximal and distal ends. An additional hemostatic agent (Surgiflo) was then placed into the renal defect. A running 2-0 V lock suture was then used to close the renal capsule using a sliding clip technique which resulted in excellent compression of the renal defect.    The renal tumor resection site was examined. Hemostasis appeared adequate.   The kidney was placed back into its normal anatomic position and covered with perinephric fat as needed.  A # 15 Blake drain was then brought through the lateral lower port site and positioned in the perinephric space.  It was secured to the skin with a nylon suture. The surgical cart was undocked.  The renal tumor specimen was removed intact within an endopouch retrieval bag via the camera port sites.  The camera port site and the other 12 mm port site were then closed at the fascial level with 0-vicryl suture.  All other laparoscopic/robotic ports were removed under direct vision and the pneumoperitoneum let down with inspection of the operative field performed and hemostasis again confirmed. All incision sites were then injected with local anesthetic and reapproximated at the skin level with 4-0 monocryl subcuticular closures.  Dermabond was applied to the skin.  The patient tolerated the procedure well and without complications.  The patient was able to be extubated and transferred to the recovery unit in satisfactory condition.  Moody Bruins MD

## 2013-06-17 NOTE — Interval H&P Note (Signed)
History and Physical Interval Note:  06/17/2013 7:27 AM  [name]  has presented today for surgery, with the diagnosis of LEFT RENAL NEOPLASM  The various methods of treatment have been discussed with the patient and family. After consideration of risks, benefits and other options for treatment, the patient has consented to  Procedure(s): ROBOTIC ASSITED PARTIAL NEPHRECTOMY (Left) as a surgical intervention .  The patient's history has been reviewed, patient examined, no change in status, stable for surgery.  I have reviewed the patient's chart and labs.  Questions were answered to the patient's satisfaction.     Skylyn Slezak,LES

## 2013-06-17 NOTE — Progress Notes (Signed)
Patient ID: [name], male   DOB: 07-Dec-1975, 37 y.o.   MRN: [id number] Post-op note  Subjective: The patient is doing well.  No complaints except appropriate soreness.  Denies N/V  Objective: Vital signs in last 24 hours: Temp:  [97.5 F (36.4 C)-98 F (36.7 C)] 97.8 F (36.6 C) (08/28 1121) Pulse Rate:  [66-95] 76 (08/28 1121) Resp:  [10-18] 14 (08/28 1121) BP: (138-144)/(73-95) 138/94 mmHg (08/28 1121) SpO2:  [96 %-100 %] 99 % (08/28 1121) Weight:  [117.3 kg (258 lb 9.6 oz)] 117.3 kg (258 lb 9.6 oz) (08/28 1121)  Intake/Output from previous day:   Intake/Output this shift: Total I/O In: 2300 [I.V.:2300] Out: 270 [Urine:200; Drains:20; Blood:50]  Physical Exam:  General: Alert and oriented. Abdomen: Soft, Nondistended. Incisions: Clean and dry.  Lab Results:  Recent Labs  06/16/13 1330 06/17/13 1025  HGB 15.5 15.1  HCT 44.7 44.2    Assessment/Plan: POD#0   1) Continue to monitor 2) Bed rest 3) IS 4) clears  5) Pain control    LOS: 0 days   Silas Flood. 06/17/2013, 2:39 PM

## 2013-06-17 NOTE — Preoperative (Signed)
Beta Blockers   Reason not to administer Beta Blockers:Not Applicable 

## 2013-06-17 NOTE — Care Management Note (Signed)
    Page 1 of 1   06/17/2013     12:52:47 PM   CARE MANAGEMENT NOTE 06/17/2013  Patient:  [name], [name]   Account Number:  [id number]  Date Initiated:  06/17/2013  Documentation initiated by:  Lanier Clam  Subjective/Objective Assessment:   37 Y/O M ADMITTED W/L RENAL MASS.     Action/Plan:   FROM HOME W/FAMILY.HAS PCP,PHARMACY.   Anticipated DC Date:  06/18/2013   Anticipated DC Plan:  HOME/SELF CARE      DC Planning Services  CM consult      Choice offered to / List presented to:             Status of service:  In process, will continue to follow Medicare Important Message given?   (If response is "NO", the following Medicare IM given date fields will be blank) Date Medicare IM given:   Date Additional Medicare IM given:    Discharge Disposition:    Per UR Regulation:  Reviewed for med. necessity/level of care/duration of stay  If discussed at Long Length of Stay Meetings, dates discussed:    Comments:  06/17/13 Tarry Fountain RN,BSN NCM [phone] S/P  LROB ASST PARTIAL NEPHRECTOMY.

## 2013-06-17 NOTE — Anesthesia Postprocedure Evaluation (Addendum)
  Anesthesia Post-op Note  Patient: [name]  Procedure(s) Performed: Procedure(s) (LRB): ROBOTIC ASSITED PARTIAL NEPHRECTOMY (Left)  Patient Location: PACU  Anesthesia Type: General  Level of Consciousness: awake and alert   Airway and Oxygen Therapy: Patient Spontanous Breathing  Post-op Pain: mild  Post-op Assessment: Post-op Vital signs reviewed, Patient's Cardiovascular Status Stable, Respiratory Function Stable, Patent Airway and No signs of Nausea or vomiting  Last Vitals:  Filed Vitals:   06/17/13 1121  BP: 138/94  Pulse: 76  Temp: 36.6 C  Resp: 14    Post-op Vital Signs: stable   Complications: No apparent anesthesia complications

## 2013-06-17 NOTE — Transfer of Care (Signed)
Immediate Anesthesia Transfer of Care Note  Patient: [name]  Procedure(s) Performed: Procedure(s): ROBOTIC ASSITED PARTIAL NEPHRECTOMY (Left)  Patient Location: PACU  Anesthesia Type:General  Level of Consciousness: sedated  Airway & Oxygen Therapy: Patient Spontanous Breathing and Patient connected to face mask oxygen  Post-op Assessment: Report given to PACU RN and Post -op Vital signs reviewed and stable  Post vital signs: Reviewed and stable  Complications: No apparent anesthesia complications

## 2013-06-17 NOTE — Anesthesia Preprocedure Evaluation (Addendum)
Anesthesia Evaluation  Patient identified by MRN, date of birth, ID band Patient awake    Reviewed: Allergy & Precautions, H&P , NPO status , Patient's Chart, lab work & pertinent test results  Airway Mallampati: II TM Distance: >3 FB Neck ROM: full    Dental no notable dental hx. (+) Teeth Intact and Dental Advisory Given   Pulmonary neg pulmonary ROS,  breath sounds clear to auscultation  Pulmonary exam normal       Cardiovascular Exercise Tolerance: Good negative cardio ROS  Rhythm:regular Rate:Normal     Neuro/Psych negative neurological ROS  negative psych ROS   GI/Hepatic negative GI ROS, Neg liver ROS,   Endo/Other  negative endocrine ROS  Renal/GU negative Renal ROS  negative genitourinary   Musculoskeletal   Abdominal   Peds  Hematology negative hematology ROS (+)   Anesthesia Other Findings   Reproductive/Obstetrics negative OB ROS                          Anesthesia Physical Anesthesia Plan  ASA: I  Anesthesia Plan: General   Post-op Pain Management:    Induction: Intravenous  Airway Management Planned: Oral ETT  Additional Equipment:   Intra-op Plan:   Post-operative Plan: Extubation in OR  Informed Consent: I have reviewed the patients History and Physical, chart, labs and discussed the procedure including the risks, benefits and alternatives for the proposed anesthesia with the patient or authorized representative who has indicated his/her understanding and acceptance.   Dental Advisory Given  Plan Discussed with: CRNA and Surgeon  Anesthesia Plan Comments:         Anesthesia Quick Evaluation  

## 2013-06-18 ENCOUNTER — Encounter (HOSPITAL_COMMUNITY): Payer: Self-pay | Admitting: Urology

## 2013-06-18 ENCOUNTER — Ambulatory Visit: Payer: BC Managed Care – PPO | Admitting: Physician Assistant

## 2013-06-18 LAB — BASIC METABOLIC PANEL
Chloride: 104 mEq/L (ref 96–112)
GFR calc Af Amer: >90 mL/min (ref >90)
GFR calc non Af Amer: >90 mL/min (ref >90)
Glucose, Bld: 137 mg/dL — ABNORMAL HIGH (ref 70–99)
Potassium: 3.4 mEq/L — ABNORMAL LOW (ref 3.5–5.1)
Sodium: 137 mEq/L (ref 135–145)

## 2013-06-18 LAB — HEMOGLOBIN AND HEMATOCRIT, BLOOD: HCT: 43.7 % (ref 39.0–52.0)

## 2013-06-18 MED ORDER — MENTHOL 3 MG MT LOZG
1.0000 | LOZENGE | OROMUCOSAL | Status: DC | PRN
  Filled 2013-06-18: qty 9

## 2013-06-18 MED ORDER — DSS 100 MG PO CAPS: 100.0000 mg | ORAL_CAPSULE | Freq: Two times a day (BID) | ORAL | Status: DC

## 2013-06-18 MED ORDER — BISACODYL 10 MG RE SUPP
10.0000 mg | Freq: Once | RECTAL | Status: AC
  Administered 2013-06-18: 10 mg via RECTAL
  Filled 2013-06-18: qty 1

## 2013-06-18 MED ORDER — OXYCODONE-ACETAMINOPHEN 5-325 MG PO TABS: 1.0000 | ORAL_TABLET | ORAL | Status: DC | PRN

## 2013-06-18 MED ORDER — MORPHINE SULFATE 2 MG/ML IJ SOLN
2.0000 mg | INTRAMUSCULAR | Status: DC | PRN
  Administered 2013-06-18 – 2013-06-19 (×5): 4 mg via INTRAVENOUS
  Filled 2013-06-18 (×5): qty 2

## 2013-06-18 NOTE — Progress Notes (Signed)
Patient ID: [name], male   DOB: January 30, 1976, 37 y.o.   MRN: [id number]  Pt overall is doing ok although he continues to complain of severe pain over the incisional areas and has been taking Percocet every 4 hrs and morphine every 2 hrs.    His exam is unremarkable.  His abdomen is nondistended.  He has had a few bowel movements today.  He does not have pain localized to a particular port site.  Continue pain control Check JP cr in AM Will SL IVF now and advance diet to regular Hope for D/C tomorrow if pain controlled If pain seems to be uncontrolled and more than expected, will consider CT with oral contrast to evaluate further.  Will check CBC with AM labs.

## 2013-06-18 NOTE — Progress Notes (Signed)
Patient ID: [name], male   DOB: [birth year], 37 y.o.   MRN: [id number]  1 Day Post-Op Subjective: The patient is doing well.  No nausea or vomiting. Pain is adequately controlled after starting Percocet.  Objective: Vital signs in last 24 hours: Temp:  [97.5 F (36.4 C)-98 F (36.7 C)] 97.5 F (36.4 C) (08/28 2210) Pulse Rate:  [66-84] 70 (08/28 2210) Resp:  [10-18] 18 (08/28 2210) BP: (132-144)/(64-95) 132/64 mmHg (08/28 2210) SpO2:  [96 %-100 %] 99 % (08/28 2210) Weight:  [117.3 kg (258 lb 9.6 oz)] 117.3 kg (258 lb 9.6 oz) (08/28 1121)  Intake/Output from previous day: 08/28 0701 - 08/29 0700 In: 5352.5 [I.V.:5077.5; IV Piggyback:200] Out: 5340 [Urine:5200; Drains:90; Blood:50] Intake/Output this shift:    Physical Exam:  General: Alert and oriented. CV: RRR Lungs: Clear bilaterally. GI: Soft, Nondistended. Incisions: Clean and dry. Urine: Clear Extremities: Nontender, no erythema, no edema.  Lab Results:  Recent Labs  06/16/13 1330 06/17/13 1025 06/18/13 0508  HGB 15.5 15.1 15.1  HCT 44.7 44.2 43.7          Recent Labs  06/16/13 1330 06/17/13 1025 06/18/13 0508  CREATININE 0.87 1.12 1.01           Results for orders placed during the hospital encounter of 06/17/13 (from the past 24 hour(s))  BASIC METABOLIC PANEL     Status: Abnormal   Collection Time    06/17/13 10:25 AM      Result Value Range   Sodium 134 (*) 135 - 145 mEq/L   Potassium 4.9  3.5 - 5.1 mEq/L   Chloride 102  96 - 112 mEq/L   CO2 27  19 - 32 mEq/L   Glucose, Bld 116 (*) 70 - 99 mg/dL   BUN 11  6 - 23 mg/dL   Creatinine, Ser 4.09  0.50 - 1.35 mg/dL   Calcium 8.4  8.4 - 81.1 mg/dL   GFR calc non Af Amer 82 (*) >90 mL/min   GFR calc Af Amer >90  >90 mL/min  HEMOGLOBIN AND HEMATOCRIT, BLOOD     Status: None   Collection Time    06/17/13 10:25 AM      Result Value Range   Hemoglobin 15.1  13.0 - 17.0 g/dL   HCT 91.4  78.2 - 95.6 %  BASIC METABOLIC PANEL     Status: Abnormal    Collection Time    06/18/13  5:08 AM      Result Value Range   Sodium 137  135 - 145 mEq/L   Potassium 3.4 (*) 3.5 - 5.1 mEq/L   Chloride 104  96 - 112 mEq/L   CO2 27  19 - 32 mEq/L   Glucose, Bld 137 (*) 70 - 99 mg/dL   BUN 9  6 - 23 mg/dL   Creatinine, Ser 2.13  0.50 - 1.35 mg/dL   Calcium 8.7  8.4 - 08.6 mg/dL   GFR calc non Af Amer >90  >90 mL/min   GFR calc Af Amer >90  >90 mL/min  HEMOGLOBIN AND HEMATOCRIT, BLOOD     Status: None   Collection Time    06/18/13  5:08 AM      Result Value Range   Hemoglobin 15.1  13.0 - 17.0 g/dL   HCT 57.8  46.9 - 62.9 %    Assessment/Plan: POD# 1 s/p robotic partial nephrectomy.  1) Ambulate, Incentive spirometry 2) Advance diet as tolerated 3) Transition to oral pain medication 4) Dulcolax suppository  5) D/C urethral catheter   Moody Bruins. MD   LOS: 1 day   Loel Betancur,LES 06/18/2013, 8:08 AM

## 2013-06-19 LAB — BASIC METABOLIC PANEL
Calcium: 9.1 mg/dL (ref 8.4–10.5)
GFR calc non Af Amer: 75 mL/min — ABNORMAL LOW (ref >90)
Potassium: 4 mEq/L (ref 3.5–5.1)
Sodium: 140 mEq/L (ref 135–145)

## 2013-06-19 LAB — CBC
Platelets: 178 10*3/uL (ref 150–400)
RBC: 4.34 MIL/uL (ref 4.22–5.81)
WBC: 8.5 10*3/uL (ref 4.0–10.5)

## 2013-06-19 LAB — CREATININE, FLUID (PLEURAL, PERITONEAL, JP DRAINAGE): Creat, Fluid: 1.1 mg/dL

## 2013-06-19 NOTE — Discharge Summary (Signed)
Physician Discharge Summary  Patient ID: [name] MRN: [id number] DOB/AGE: February 18, 1976 37 y.o.  Admit date: 06/17/2013 Discharge date: 06/19/2013  Admission Diagnoses: Left renal mass Discharge Diagnoses:  Left renal mass  Discharged Condition: good  Hospital Course: Admit following surgery. By POD#2 he was ambulating, passing flatus and tolerating a regular. Pain much improved today. H/h stable, JP Cr normal.   Consults: None  Significant Diagnostic Studies: none  Treatments: surgery: Left robotic-assisted laparoscopic partial nephrectomy   Discharge Exam: Blood pressure 132/67, pulse 90, temperature 98.5 F (36.9 C), temperature source Oral, resp. rate 16, height 6\' 1"  (1.854 m), weight 117.3 kg (258 lb 9.6 oz), SpO2 100.00%. NAD Sitting on edge of bed Abd - port site C/D/I; soft, NT, excellent BS Ext - no CCE  Disposition: 01-Home or Self Care  Discharge Orders   Future Appointments Provider Department Dept Phone   06/25/2013 10:30 AM Brynda Rim [facility] at  [facility] [phone]   07/05/2013 8:30 AM Piedad Climes, PA-C [facility] at  [facility] [phone]   Future Orders Complete By Expires   Discontinue JP/Blake drain  As directed        Medication List    STOP taking these medications       cephALEXin 500 MG capsule  Commonly known as:  KEFLEX     doxycycline 100 MG tablet  Commonly known as:  VIBRA-TABS     fish oil-omega-3 fatty acids 1000 MG capsule     GOODYS BODY PAIN PO     multivitamin with minerals Tabs tablet     naproxen sodium 220 MG tablet  Commonly known as:  ANAPROX     PROBIOTIC DAILY PO      TAKE these medications       acetaminophen 500 MG tablet  Commonly known as:  TYLENOL  Take 500 mg by mouth every 6 (six) hours as needed for pain.     clindamycin 300 MG capsule  Commonly known as:  CLEOCIN  Take 1 capsule (300 mg total) by mouth 3 (three) times daily.     DSS 100 MG Caps   Take 100 mg by mouth 2 (two) times daily.     EPINEPHrine 0.3 mg/0.3 mL Soaj injection  Commonly known as:  EPI-PEN  Inject 0.3 mLs (0.3 mg total) into the muscle once.     oxyCODONE-acetaminophen 5-325 MG per tablet  Commonly known as:  PERCOCET/ROXICET  Take 1-2 tablets by mouth every 4 (four) hours as needed.     tadalafil 20 MG tablet  Commonly known as:  CIALIS  Take 0.5-1 tablets (10-20 mg total) by mouth every other day as needed for erectile dysfunction.           Follow-up Information   Follow up with BORDEN,LES, MD On 07/08/2013. (at 2:45)    Specialty:  Urology   Contact information:   [address] [place] [address] [place] [address] [phone]       Signed: Antony Haste 06/19/2013, 1:55 PM

## 2013-06-25 ENCOUNTER — Ambulatory Visit: Payer: BC Managed Care – PPO | Admitting: Physician Assistant

## 2013-06-28 ENCOUNTER — Encounter: Payer: Self-pay | Admitting: Physician Assistant

## 2013-06-28 ENCOUNTER — Ambulatory Visit (INDEPENDENT_AMBULATORY_CARE_PROVIDER_SITE_OTHER): Payer: BC Managed Care – PPO | Admitting: Physician Assistant

## 2013-06-28 VITALS — BP 152/96 | HR 86 | Temp 97.8°F | Resp 16 | Wt 258.8 lb

## 2013-06-28 DIAGNOSIS — L0390 Cellulitis, unspecified: Secondary | ICD-10-CM

## 2013-06-28 DIAGNOSIS — L0291 Cutaneous abscess, unspecified: Secondary | ICD-10-CM

## 2013-06-28 DIAGNOSIS — Z905 Acquired absence of kidney: Secondary | ICD-10-CM | POA: Insufficient documentation

## 2013-06-28 DIAGNOSIS — Z9889 Other specified postprocedural states: Secondary | ICD-10-CM

## 2013-06-28 MED ORDER — DOXYCYCLINE HYCLATE 100 MG PO TABS: 100.0000 mg | ORAL_TABLET | Freq: Two times a day (BID) | ORAL | Status: DC

## 2013-06-28 MED ORDER — OXYCODONE-ACETAMINOPHEN 10-325 MG PO TABS: 1.0000 | ORAL_TABLET | Freq: Four times a day (QID) | ORAL | Status: DC | PRN

## 2013-06-28 NOTE — Progress Notes (Signed)
Patient ID: [name], male   DOB: 10-31-1975, 37 y.o.   MRN: [id number]  Patient presents to clinic today for follow-up after partial nephrectomy on 8/28. Patient states he has been doing well overall since discharge from the hospital on 8/30.  Has been taking a daily probiotic and stool softener to help with bowel movements.  Patient still taking Percocet for pain which he states is dull and achy around incision sites and left side of abdomen.  Ranks pain at 8/10.  Is out of pain medicine.  Patient has been keeping incision sites clean.  Denies drainage from or erythema around incisions.  Denies fever, chills, sweats.  Endorses some occasional nausea but that is much improved from stay at hospital.   Patient has noticed that one of his incision sites bulges when he strains to use the bathroom.  Has had no pain and the bulge always retracts when he stops bearing down.    Patient also has noticed a "boil" in the right groin region over the past few days.  Is tender to touch and has been draining on its own.  Is concerned only because he has a recent history of cellulitis.  Denies streaking erythema.  Past Medical History  Diagnosis Date  . Cellulitis   . Hx of vasectomy   . Chicken pox     Current Outpatient Prescriptions on File Prior to Visit  Medication Sig Dispense Refill  . acetaminophen (TYLENOL) 500 MG tablet Take 500 mg by mouth every 6 (six) hours as needed for pain.      Marland Kitchen docusate sodium 100 MG CAPS Take 100 mg by mouth 2 (two) times daily.  10 capsule  0  . EPINEPHrine (EPI-PEN) 0.3 mg/0.3 mL SOAJ injection Inject 0.3 mLs (0.3 mg total) into the muscle once.  1 Device  1  . tadalafil (CIALIS) 20 MG tablet Take 0.5-1 tablets (10-20 mg total) by mouth every other day as needed for erectile dysfunction.  5 tablet  11   No current facility-administered medications on file prior to visit.    Allergies  Allergen Reactions  . Bee Venom Shortness Of Breath and Swelling    Arm swells   . Hornet Venom Shortness Of Breath and Swelling    Arm swells    Family History  Problem Relation Age of Onset  . Cirrhosis Father   . Colitis Father   . Heart disease Father   . Asthma Father   . Heart attack Other     Paternal Grandparents  . Stroke Other     Paternal Grandparents  . Prostate cancer Paternal Grandfather   . Diabetes Maternal Grandfather   . Alcohol abuse Mother   . Other Brother     Intestinal Fissure  . Healthy Sister   . Asthma Son   . Other Father     Chemical Imbalance  . Other Brother     Chemical Imbalance    History   Social History  . Marital Status: Divorced    Spouse Name: N/A    Number of Children: N/A  . Years of Education: N/A   Social History Main Topics  . Smoking status: Current Every Day Smoker -- 1.00 packs/day for 24 years    Types: Cigarettes  . Smokeless tobacco: Never Used  . Alcohol Use: Yes     Comment: occasional/social  . Drug Use: No  . Sexual Activity: Yes   Other Topics Concern  . None   Social History Narrative  .  None   Review of Systems  Constitutional: Negative for fever, chills, weight loss and malaise/fatigue.  Gastrointestinal: Positive for nausea, abdominal pain and constipation. Negative for vomiting, diarrhea, blood in stool and melena.  Genitourinary: Negative for dysuria, urgency, frequency, hematuria and flank pain.  Musculoskeletal: Negative for myalgias.   Filed Vitals:   06/28/13 1444  BP: 152/96  Pulse: 86  Temp: 97.8 F (36.6 C)  Resp: 16    Physical Exam  Vitals reviewed. Constitutional: He is well-developed, well-nourished, and in no distress.  HENT:  Head: Normocephalic and atraumatic.  Eyes: Conjunctivae are normal. Pupils are equal, round, and reactive to light.  Cardiovascular: Normal rate, regular rhythm, normal heart sounds and intact distal pulses.   Pulmonary/Chest: Effort normal and breath sounds normal.  Abdominal: Soft. Bowel sounds are normal. He exhibits no  distension and no mass. There is tenderness. There is no rebound and no guarding.  Some bulging noted at incision superior to umbilicus when patient bears down.  Is completely reducible and disappears when patient relaxes.    Skin:  Small 1 cm tender mass of right inguinal region that is open and non fluctuant.  No evidence of surrounding erythema.     Recent Results (from the past 2160 hour(s))  URINALYSIS, ROUTINE W REFLEX MICROSCOPIC     Status: None   Collection Time    06/01/13  2:29 AM      Result Value Range   Color, Urine YELLOW  YELLOW   APPearance CLEAR  CLEAR   Specific Gravity, Urine 1.009  1.005 - 1.030   pH 6.5  5.0 - 8.0   Glucose, UA NEGATIVE  NEGATIVE mg/dL   Hgb urine dipstick NEGATIVE  NEGATIVE   Bilirubin Urine NEGATIVE  NEGATIVE   Ketones, ur NEGATIVE  NEGATIVE mg/dL   Protein, ur NEGATIVE  NEGATIVE mg/dL   Urobilinogen, UA 0.2  0.0 - 1.0 mg/dL   Nitrite NEGATIVE  NEGATIVE   Leukocytes, UA NEGATIVE  NEGATIVE   Comment: MICROSCOPIC NOT DONE ON URINES WITH NEGATIVE PROTEIN, BLOOD, LEUKOCYTES, NITRITE, OR GLUCOSE <1000 mg/dL.  CBC WITH DIFFERENTIAL     Status: Abnormal   Collection Time    06/01/13  2:40 AM      Result Value Range   WBC 12.9 (*) 4.0 - 10.5 K/uL   RBC 4.89  4.22 - 5.81 MIL/uL   Hemoglobin 16.3  13.0 - 17.0 g/dL   HCT 19.1  47.8 - 29.5 %   MCV 94.7  78.0 - 100.0 fL   MCH 33.3  26.0 - 34.0 pg   MCHC 35.2  30.0 - 36.0 g/dL   RDW 62.1  30.8 - 65.7 %   Platelets 191  150 - 400 K/uL   Neutrophils Relative % 73  43 - 77 %   Neutro Abs 9.4 (*) 1.7 - 7.7 K/uL   Lymphocytes Relative 16  12 - 46 %   Lymphs Abs 2.1  0.7 - 4.0 K/uL   Monocytes Relative 8  3 - 12 %   Monocytes Absolute 1.0  0.1 - 1.0 K/uL   Eosinophils Relative 3  0 - 5 %   Eosinophils Absolute 0.3  0.0 - 0.7 K/uL   Basophils Relative 0  0 - 1 %   Basophils Absolute 0.0  0.0 - 0.1 K/uL  BASIC METABOLIC PANEL     Status: Abnormal   Collection Time    06/01/13  2:40 AM      Result  Value Range  Sodium 138  135 - 145 mEq/L   Potassium 3.8  3.5 - 5.1 mEq/L   Chloride 102  96 - 112 mEq/L   CO2 26  19 - 32 mEq/L   Glucose, Bld 127 (*) 70 - 99 mg/dL   BUN 15  6 - 23 mg/dL   Creatinine, Ser 1.61  0.50 - 1.35 mg/dL   Calcium 9.3  8.4 - 09.6 mg/dL   GFR calc non Af Amer 84 (*) >90 mL/min   GFR calc Af Amer >90  >90 mL/min   Comment:            The eGFR has been calculated     using the CKD EPI equation.     This calculation has not been     validated in all clinical     situations.     eGFR's persistently     <90 mL/min signify     possible Chronic Kidney Disease.  BASIC METABOLIC PANEL     Status: Abnormal   Collection Time    06/16/13  1:30 PM      Result Value Range   Sodium 136  135 - 145 mEq/L   Potassium 3.7  3.5 - 5.1 mEq/L   Chloride 101  96 - 112 mEq/L   CO2 24  19 - 32 mEq/L   Glucose, Bld 121 (*) 70 - 99 mg/dL   BUN 13  6 - 23 mg/dL   Creatinine, Ser 0.45  0.50 - 1.35 mg/dL   Calcium 8.9  8.4 - 40.9 mg/dL   GFR calc non Af Amer >90  >90 mL/min   GFR calc Af Amer >90  >90 mL/min   Comment: (NOTE)     The eGFR has been calculated using the CKD EPI equation.     This calculation has not been validated in all clinical situations.     eGFR's persistently <90 mL/min signify possible Chronic Kidney     Disease.  CBC     Status: None   Collection Time    06/16/13  1:30 PM      Result Value Range   WBC 6.3  4.0 - 10.5 K/uL   RBC 4.70  4.22 - 5.81 MIL/uL   Hemoglobin 15.5  13.0 - 17.0 g/dL   HCT 81.1  91.4 - 78.2 %   MCV 95.1  78.0 - 100.0 fL   MCH 33.0  26.0 - 34.0 pg   MCHC 34.7  30.0 - 36.0 g/dL   RDW 95.6  21.3 - 08.6 %   Platelets 212  150 - 400 K/uL  TYPE AND SCREEN     Status: None   Collection Time    06/16/13  1:30 PM      Result Value Range   ABO/RH(D) A POS     Antibody Screen NEG     Sample Expiration 06/20/2013    ABO/RH     Status: None   Collection Time    06/16/13  1:30 PM      Result Value Range   ABO/RH(D) A POS     BASIC METABOLIC PANEL     Status: Abnormal   Collection Time    06/17/13 10:25 AM      Result Value Range   Sodium 134 (*) 135 - 145 mEq/L   Potassium 4.9  3.5 - 5.1 mEq/L   Comment: DELTA CHECK NOTED     REPEATED TO VERIFY     NO VISIBLE HEMOLYSIS   Chloride 102  96 - 112 mEq/L   CO2 27  19 - 32 mEq/L   Glucose, Bld 116 (*) 70 - 99 mg/dL   BUN 11  6 - 23 mg/dL   Creatinine, Ser 1.61  0.50 - 1.35 mg/dL   Calcium 8.4  8.4 - 09.6 mg/dL   GFR calc non Af Amer 82 (*) >90 mL/min   GFR calc Af Amer >90  >90 mL/min   Comment: (NOTE)     The eGFR has been calculated using the CKD EPI equation.     This calculation has not been validated in all clinical situations.     eGFR's persistently <90 mL/min signify possible Chronic Kidney     Disease.  HEMOGLOBIN AND HEMATOCRIT, BLOOD     Status: None   Collection Time    06/17/13 10:25 AM      Result Value Range   Hemoglobin 15.1  13.0 - 17.0 g/dL   HCT 04.5  40.9 - 81.1 %  BASIC METABOLIC PANEL     Status: Abnormal   Collection Time    06/18/13  5:08 AM      Result Value Range   Sodium 137  135 - 145 mEq/L   Potassium 3.4 (*) 3.5 - 5.1 mEq/L   Comment: DELTA CHECK NOTED   Chloride 104  96 - 112 mEq/L   CO2 27  19 - 32 mEq/L   Glucose, Bld 137 (*) 70 - 99 mg/dL   BUN 9  6 - 23 mg/dL   Creatinine, Ser 9.14  0.50 - 1.35 mg/dL   Calcium 8.7  8.4 - 78.2 mg/dL   GFR calc non Af Amer >90  >90 mL/min   GFR calc Af Amer >90  >90 mL/min   Comment: (NOTE)     The eGFR has been calculated using the CKD EPI equation.     This calculation has not been validated in all clinical situations.     eGFR's persistently <90 mL/min signify possible Chronic Kidney     Disease.  HEMOGLOBIN AND HEMATOCRIT, BLOOD     Status: None   Collection Time    06/18/13  5:08 AM      Result Value Range   Hemoglobin 15.1  13.0 - 17.0 g/dL   HCT 95.6  21.3 - 08.6 %  BASIC METABOLIC PANEL     Status: Abnormal   Collection Time    06/19/13  5:30 AM      Result  Value Range   Sodium 140  135 - 145 mEq/L   Potassium 4.0  3.5 - 5.1 mEq/L   Chloride 105  96 - 112 mEq/L   CO2 31  19 - 32 mEq/L   Glucose, Bld 101 (*) 70 - 99 mg/dL   BUN 9  6 - 23 mg/dL   Creatinine, Ser 5.78  0.50 - 1.35 mg/dL   Calcium 9.1  8.4 - 46.9 mg/dL   GFR calc non Af Amer 75 (*) >90 mL/min   GFR calc Af Amer 86 (*) >90 mL/min   Comment: (NOTE)     The eGFR has been calculated using the CKD EPI equation.     This calculation has not been validated in all clinical situations.     eGFR's persistently <90 mL/min signify possible Chronic Kidney     Disease.  CBC     Status: None   Collection Time    06/19/13  5:30 AM      Result Value Range   WBC 8.5  4.0 - 10.5 K/uL   RBC 4.34  4.22 - 5.81 MIL/uL   Hemoglobin 14.1  13.0 - 17.0 g/dL   HCT 09.8  11.9 - 14.7 %   MCV 96.3  78.0 - 100.0 fL   MCH 32.5  26.0 - 34.0 pg   MCHC 33.7  30.0 - 36.0 g/dL   RDW 82.9  56.2 - 13.0 %   Platelets 178  150 - 400 K/uL  CREATININE, BODY FLUID     Status: None   Collection Time    06/19/13  6:29 AM      Result Value Range   Creat, Fluid 1.1     Comment: NO NORMAL RANGE ESTABLISHED FOR THIS TEST     Performed at [facility]   Fluid Type-FCRE DRAINAGE      Assessment/Plan: H/O partial nephrectomy Incision sites healing. Rx for percocet prn for pain.  Continue daily probiotic, fiber supplement, prune juice, stool softener to help with bowel movements.  "Bulge" is most likely herniation of intestines against weakened, but healing tissue.  Since bulge is only apparent with straining and is not painful or incarcerated, will monitor.  Patient to follow-up with Surgeon in 1 week.  Abscess Healing.  No lingering drainage or surrounding erythema.  Due to patient's hx of cellulitis, rx doxycycline to keep on hand in case symptoms develop.  Follow-up if symptoms present.

## 2013-06-28 NOTE — Patient Instructions (Signed)
Please hold on to prescription for antibiotic.  Keep area cleaned with peroxide and neosporin ointment.  If redness occurs and starts to spread, begin antibiotic.  Continue daily probiotic and stool softener.  I encourage prune juice and daily fiber supplement to help with bowel movements.  Please follow-up with nephrologist.  Call or return if symptoms worsen or new things arise.  Hope you feel better.

## 2013-06-28 NOTE — Assessment & Plan Note (Signed)
Healing.  No lingering drainage or surrounding erythema.  Due to patient's hx of cellulitis, rx doxycycline to keep on hand in case symptoms develop.  Follow-up if symptoms present.

## 2013-06-28 NOTE — Assessment & Plan Note (Signed)
Incision sites healing. Rx for percocet prn for pain.  Continue daily probiotic, fiber supplement, prune juice, stool softener to help with bowel movements.  "Bulge" is most likely herniation of intestines against weakened, but healing tissue.  Since bulge is only apparent with straining and is not painful or incarcerated, will monitor.  Patient to follow-up with Surgeon in 1 week.

## 2013-07-02 ENCOUNTER — Telehealth: Payer: Self-pay | Admitting: Physician Assistant

## 2013-07-02 DIAGNOSIS — Z905 Acquired absence of kidney: Secondary | ICD-10-CM

## 2013-07-02 MED ORDER — HYDROCODONE-ACETAMINOPHEN 10-325 MG PO TABS: 1.0000 | ORAL_TABLET | Freq: Three times a day (TID) | ORAL | Status: DC | PRN

## 2013-07-02 NOTE — Telephone Encounter (Signed)
I have sent in rx for hydrocodone 30 tablets no refills.  Needs to be phoned in.

## 2013-07-02 NOTE — Telephone Encounter (Signed)
Rx request phoned to pharmacy[facility]  

## 2013-07-02 NOTE — Telephone Encounter (Signed)
Patient is out of pain medication, percocet and would like a refill. I spoke to [place] and he states that he may switch pain medication. [facility] [employer].

## 2013-07-05 ENCOUNTER — Encounter: Payer: BC Managed Care – PPO | Admitting: Physician Assistant

## 2013-07-27 ENCOUNTER — Encounter: Payer: Self-pay | Admitting: Family Medicine

## 2013-07-27 ENCOUNTER — Ambulatory Visit (INDEPENDENT_AMBULATORY_CARE_PROVIDER_SITE_OTHER): Payer: BC Managed Care – PPO | Admitting: Family Medicine

## 2013-07-27 ENCOUNTER — Ambulatory Visit (HOSPITAL_BASED_OUTPATIENT_CLINIC_OR_DEPARTMENT_OTHER)
Admission: RE | Admit: 2013-07-27 | Discharge: 2013-07-27 | Disposition: A | Discharge status: Home / Self Care | Payer: BC Managed Care – PPO | Source: Ambulatory Visit | Attending: Family Medicine | Admitting: Family Medicine

## 2013-07-27 ENCOUNTER — Other Ambulatory Visit: Payer: Self-pay | Admitting: Family Medicine

## 2013-07-27 VITALS — BP 120/82 | HR 82 | Temp 98.3°F | Ht 73.0 in | Wt 250.1 lb

## 2013-07-27 DIAGNOSIS — R197 Diarrhea, unspecified: Secondary | ICD-10-CM

## 2013-07-27 DIAGNOSIS — R350 Frequency of micturition: Secondary | ICD-10-CM

## 2013-07-27 DIAGNOSIS — F329 Major depressive disorder, single episode, unspecified: Secondary | ICD-10-CM

## 2013-07-27 DIAGNOSIS — R509 Fever, unspecified: Secondary | ICD-10-CM

## 2013-07-27 DIAGNOSIS — L0291 Cutaneous abscess, unspecified: Secondary | ICD-10-CM

## 2013-07-27 DIAGNOSIS — F341 Dysthymic disorder: Secondary | ICD-10-CM

## 2013-07-27 DIAGNOSIS — R109 Unspecified abdominal pain: Secondary | ICD-10-CM | POA: Insufficient documentation

## 2013-07-27 DIAGNOSIS — C649 Malignant neoplasm of unspecified kidney, except renal pelvis: Secondary | ICD-10-CM

## 2013-07-27 LAB — POCT URINALYSIS DIPSTICK
Bilirubin, UA: NEGATIVE
Blood, UA: NEGATIVE
Glucose, UA: NEGATIVE
Leukocytes, UA: NEGATIVE
Nitrite, UA: NEGATIVE
Urobilinogen, UA: 0.2

## 2013-07-27 LAB — SEDIMENTATION RATE: Sed Rate: 1 mm/hr (ref 0–16)

## 2013-07-27 LAB — CBC
Hemoglobin: 16.8 g/dL (ref 13.0–17.0)
Platelets: 239 10*3/uL (ref 150–400)
RBC: 5.02 MIL/uL (ref 4.22–5.81)
WBC: 7.4 10*3/uL (ref 4.0–10.5)

## 2013-07-27 MED ORDER — LORAZEPAM 0.5 MG PO TABS: 0.5000 mg | ORAL_TABLET | Freq: Three times a day (TID) | ORAL | Status: DC | PRN

## 2013-07-27 MED ORDER — ESCITALOPRAM OXALATE 10 MG PO TABS: 10.0000 mg | ORAL_TABLET | Freq: Every day | ORAL | Status: DC

## 2013-07-27 MED ORDER — OXYCODONE-ACETAMINOPHEN 10-325 MG PO TABS: 1.0000 | ORAL_TABLET | ORAL | Status: DC | PRN

## 2013-07-27 MED ORDER — IOHEXOL 300 MG/ML  SOLN
100.0000 mL | Freq: Once | INTRAMUSCULAR | Status: AC | PRN
  Administered 2013-07-27: 100 mL via INTRAVENOUS

## 2013-07-27 NOTE — Patient Instructions (Addendum)
Start the Lexapro/Escitalopram 10 mg tab at just 1/2 tab daily for a week then titrate up as tolerated to a full tab  Ileus The intestine (bowel, or gut) is a long muscular tube connecting your stomach to your rectum. If the intestine stops working, food cannot pass through. This is called an ileus. This can happen for a variety of reasons. Ileus is a major medical problem that usually requires hospitalization. If your intestine stops working because of a blockage, this is called a bowel obstruction, and is a different condition. CAUSES   Surgery in your abdomen. This can last from a few hours to a few days.  An infection or inflammation in the belly (abdomen). This includes inflammation of the lining of the abdomen (peritonitis).  Infection or inflammation in other parts of the body, such as pneumonia or pancreatitis.  Passage of gallstones or kidney stones.  Damage to the nerves or blood vessels which go to the bowel.  Imbalance in the salts in the blood (electrolytes).  Injury to the brain and or spinal cord.  Medications. Many medications can cause ileus or make it worse. The most common of these are strong pain medications. SYMPTOMS  Symptoms of bowel obstruction come from the bowel inactivity. They may include:  Bloating. Your belly gets bigger (distension).  Pain or discomfort in the abdomen.  Poor appetite, feeling sick to your stomach (nausea) and vomiting.  You may also not be able to hear your normal bowel sounds, such as "growling" in your stomach. DIAGNOSIS   Your history and a physical exam will usually suggest to your caregiver that you have an ileus.  X-rays or a CT scan of your abdomen will confirm the diagnosis. X-rays, CT scans and lab tests may also suggest the cause. TREATMENT   Rest the intestine until it starts working again. This is most often accomplished by:  Stopping intake of oral food and drink. Dehydration is prevented by using IV (intravenous)  fluids.  Sometimes, a naso-gastric tube (NG tube) is needed. This is a narrow plastic tube inserted through your nose and into your stomach. It is connected to suction to keep the stomach emptied out. This also helps treat the nausea and vomiting.  If there is an imbalance in the electrolytes, they are corrected with supplements in your intravenous fluids.  Medications that might make an ileus worse might be stopped.  There are no medications that reliably treat ileus, though your caregiver may suggest a trial of certain medications.  If your condition is slow to resolve, you will be re-evaluated to be sure another condition, such as a blockage, is not present. Ileus is common and usually has a good outcome. Depending on cause of your ileus, it usually can be treated by your caregivers with good results. Sometimes, specialists (surgeons or gastroenterologists) are asked to assist in your care.  HOME CARE INSTRUCTIONS   Follow your caregiver's instructions regarding diet and fluid intake. This will usually include drinking plenty of clear fluids, avoiding alcohol and caffeine, and eating a gentle diet.  Follow your caregiver's instructions regarding activity. A period of rest is sometimes advised before returning to work or school.  Take only medications prescribed by your caregiver. Be especially careful with narcotic pain medication, which can slow your bowel activity and contribute to ileus.  Keep any follow-up appointments with your caregiver or specialists. SEEK MEDICAL CARE IF:   You have a recurrence of nausea, vomiting or abdominal discomfort.  You develop fever of  more than 102 F (38.9 C). SEEK IMMEDIATE MEDICAL CARE IF:   You have severe abdominal pain.  You are unable to keep fluids down. Document Released: 10/10/2003 Document Revised: 12/30/2011 Document Reviewed: 02/09/2009 [facility] Patient Information 2014 [place], [place]. Clostridium Difficile Infection Clostridium  difficile (C. diff) is a bacteria found in the intestinal tract or colon. Under certain conditions, it causes diarrhea and sometimes severe disease. The severe form of the disease is known as pseudomembranous colitis (often called C. diff colitis). This disease can damage the lining of the colon or cause the colon to become enlarged (toxic megacolon).  CAUSES  Your colon normally contains many different bacteria, including C. diff. The balance of bacteria in your colon can change during illness. This is especially true when you take antibiotic medicine. Taking antibiotics may allow the C. diff to grow, multiply excessively, and make a toxin that then causes illness. The elderly and people with certain medical conditions have a greater risk of getting C. diff infections. SYMPTOMS   Watery diarrhea.  Fever.  Fatigue.  Loss of appetite.  Nausea.  Abdominal swelling, pain, or tenderness.  Dehydration. DIAGNOSIS  Your symptoms may make your caregiver suspicious of a C. diff infection, especially if you have used antibiotics in the preceding weeks. However, there are only 2 ways to know for certain whether you have a C. diff infection:  A lab test that finds the toxin in your stool.  The specific appearance of an abnormality (pseudomembrane) in your colon. This can only be seen by doing a sigmoidoscopy or colonoscopy. These procedures involve passing an instrument through your rectum to look at the inside of your colon. Your caregiver will help determine if these tests are necessary. TREATMENT   Most people are successfully treated with 1 of 2 specific antibiotics, usually given by mouth. Other antibiotics you are receiving are stopped if possible.  Intravenous (IV) fluids and correction of electrolyte imbalance may be necessary.  Rarely, surgery may be needed to remove the infected part of the intestines.  Careful hand washing by you and your caregivers is important to prevent the spread  of infection. In the hospital, your caregivers may also put on gowns and gloves to prevent the spread of the C. diff bacteria. Your room is also cleaned regularly with a hospital grade disinfectant. HOME CARE INSTRUCTIONS  Drink enough fluids to keep your urine clear or pale yellow. Avoid milk, caffeine, and alcohol.  Ask your caregiver for specific rehydration instructions.  Try eating small, frequent meals rather than large meals.  Take your antibiotics as directed. Finish them even if you start to feel better.  Do not use medicines to slow diarrhea. This could delay healing or cause complications.  Wash your hands thoroughly after using the bathroom and before preparing food.  Make sure people who live with you wash their hands often, too. SEEK MEDICAL CARE IF:  Diarrhea persists longer than expected or recurs after completing your course of antibiotic treatment for the C. diff infection.  You have trouble staying hydrated. SEEK IMMEDIATE MEDICAL CARE IF:  You develop a new fever.  You have increasing abdominal pain or tenderness.  There is blood in your stools, or your stools are dark black and tarry.  You cannot hold down food or liquids. MAKE SURE YOU:   Understand these instructions.  Will watch your condition.  Will get help right away if you are not doing well or get worse. Document Released: 07/17/2005 Document Revised: 12/30/2011 Document  Reviewed: 03/15/2011 [employer] Patient Information 2014 [place], [place]. Helicobacter Pylori Disease Often patients with stomach or duodenal ulcers not caused by irritants, are infected with a germ. The germ is called helicobacter pylori (H. pylori). This bacterium lives on the surface of stomach and small bowel. It can cause redness, soreness and ulcers. Ulcers are a hole in the lining of your stomach or small bowel. Blood and special breath tests can detect if you are infected with H. pylori. Tests can be done on samples taken  from the stomach if you have endoscopy. After treatment you may have tests to prove you are cured. These can be done about a month after you finish the treatment or as your caregiver suggests. Most infections can be cured with a combination of antibiotics. Antibiotics are medications which kill germs such as H. pylori. Anti-ulcer medicines which block stomach acid secretion may also be used. Treatment will be continued for the time your caregiver suggests. Call your caregiver if you need more information about H. pylori. Call also if your symptoms get worse during or after treatment. You will not need a special diet. Avoid:  Smoking.  Aspirin.  Ibuprofen.  Other anti-inflammatory drugs. Alcohol and spicy foods may also make your symptoms worse. The best advice is to avoid anything you find upsetting to your stomach. SEEK IMMEDIATE MEDICAL CARE IF:  You develop sharp, sudden, lasting stomach pain.  You have bloody vomit or vomit that looks like coffee grounds.  You have bloody or black stools.  You develop a lightheaded feeling, fainting, or become weak and sweaty. Document Released: 10/07/2005 Document Revised: 12/30/2011 Document Reviewed: 03/25/2007 [facility] Patient Information 2013 [place], [place].

## 2013-07-27 NOTE — Progress Notes (Signed)
Quick Note:  Patient Informed and voiced understanding ______ 

## 2013-07-27 NOTE — Progress Notes (Signed)
Patient ID: [name], male   DOB: 10-26-1975, 36 y.o.   MRN: [id number] Cheikh Bramble [id number] Jul 22, 1976 07/27/2013      Progress Note-Follow Up  Subjective  Chief Complaint  Chief Complaint  Patient presents with  . Dizziness    X 1.5 weeks  . Nausea    X 1.5 weeks  . Diarrhea    X 1.5 weeks    HPI  Patient is a 37 year old Caucasian male who is in today complaining of a week and a half's worth of loose stool. He is describing several loose stool again having bloody or black. No fevers or chills but he does have malaise and myalgias. Has been struggling with some nausea but no vomiting and some lightheaded feeling. Also complains of myalgias and malaise. Heart was her to go partial nephrectomy for renal cell carcinoma had initially been doing well. Describes abdominal pain and burning most notably in the left upper quadrant. No syncope. No high-grade fevers. Acknowledges feeling very anxious and depressed since diagnosis as well.  Past Medical History  Diagnosis Date  . Cellulitis   . Hx of vasectomy   . Chicken pox     Past Surgical History  Procedure Laterality Date  . Wrist surgery Left middle school    "arteries and nerves tangled up"  . Vasectomy  2012  . Wisdom tooth extraction  middle school  . Robotic assited partial nephrectomy Left 06/17/2013    Procedure: ROBOTIC ASSITED PARTIAL NEPHRECTOMY;  Surgeon: Crecencio Mc, MD;  Location: [facility];  Service: Urology;  Laterality: Left;    Family History  Problem Relation Age of Onset  . Cirrhosis Father   . Colitis Father   . Heart disease Father   . Asthma Father   . Heart attack Other     Paternal Grandparents  . Stroke Other     Paternal Grandparents  . Prostate cancer Paternal Grandfather   . Diabetes Maternal Grandfather   . Alcohol abuse Mother   . Other Brother     Intestinal Fissure  . Healthy Sister   . Asthma Son   . Other Father     Chemical Imbalance  . Other Brother     Chemical Imbalance     History   Social History  . Marital Status: Divorced    Spouse Name: N/A    Number of Children: N/A  . Years of Education: N/A   Occupational History  . Not on file.   Social History Main Topics  . Smoking status: Current Every Day Smoker -- 1.00 packs/day for 24 years    Types: Cigarettes  . Smokeless tobacco: Never Used  . Alcohol Use: Yes     Comment: occasional/social  . Drug Use: No  . Sexual Activity: Yes   Other Topics Concern  . Not on file   Social History Narrative  . No narrative on file    Current Outpatient Prescriptions on File Prior to Visit  Medication Sig Dispense Refill  . tadalafil (CIALIS) 20 MG tablet Take 0.5-1 tablets (10-20 mg total) by mouth every other day as needed for erectile dysfunction.  5 tablet  11  . doxycycline (VIBRA-TABS) 100 MG tablet Take 1 tablet (100 mg total) by mouth 2 (two) times daily.  20 tablet  0  . EPINEPHrine (EPI-PEN) 0.3 mg/0.3 mL SOAJ injection Inject 0.3 mLs (0.3 mg total) into the muscle once.  1 Device  1   No current facility-administered medications on file prior to visit.  Allergies  Allergen Reactions  . Bee Venom Shortness Of Breath and Swelling    Arm swells  . Hornet Venom Shortness Of Breath and Swelling    Arm swells    Review of Systems  Review of Systems  Constitutional: Positive for fever, chills and malaise/fatigue.  HENT: Negative for congestion.   Eyes: Negative for discharge.  Respiratory: Negative for shortness of breath.   Cardiovascular: Negative for chest pain, palpitations and leg swelling.  Gastrointestinal: Positive for nausea, diarrhea and blood in stool. Negative for abdominal pain, constipation and melena.  Genitourinary: Positive for frequency and flank pain. Negative for dysuria.  Musculoskeletal: Positive for myalgias and back pain. Negative for falls.  Skin: Negative for rash.  Neurological: Positive for weakness. Negative for loss of consciousness and headaches.   Endo/Heme/Allergies: Negative for polydipsia.  Psychiatric/Behavioral: Negative for depression and suicidal ideas. The patient is not nervous/anxious and does not have insomnia.     Objective  BP 120/82  Pulse 82  Temp(Src) 98.3 F (36.8 C) (Oral)  Ht 6\' 1"  (1.854 m)  Wt 250 lb 1.3 oz (113.436 kg)  BMI 33 kg/m2  SpO2 97%  Physical Exam  Physical Exam  Constitutional: He is oriented to person, place, and time and well-developed, well-nourished, and in no distress. No distress.  HENT:  Head: Normocephalic and atraumatic.  Eyes: Conjunctivae are normal.  Neck: Neck supple. No thyromegaly present.  Cardiovascular: Normal rate, regular rhythm and normal heart sounds.   No murmur heard. Pulmonary/Chest: Effort normal and breath sounds normal. No respiratory distress.  Abdominal: Soft. He exhibits no distension and no mass. There is tenderness. There is no rebound and no guarding.  Decreased bowel sounds  Musculoskeletal: He exhibits no edema.  Neurological: He is alert and oriented to person, place, and time.  Skin: Skin is warm.  Psychiatric: Memory, affect and judgment normal.    No results found for this basename: TSH   Lab Results  Component Value Date   WBC 7.4 07/27/2013   HGB 16.8 07/27/2013   HCT 46.3 07/27/2013   MCV 92.2 07/27/2013   PLT 239 07/27/2013   Lab Results  Component Value Date   CREATININE 1.21 06/19/2013   BUN 9 06/19/2013   NA 140 06/19/2013   K 4.0 06/19/2013   CL 105 06/19/2013   CO2 31 06/19/2013     Assessment & Plan  Renal cell carcinoma Did well immediately after surgery but has been struggling with diarrhea  Diarrhea Stool cultures, stool for Cdiff etc all neg. Encouraged probiotics, benefiber and if persists will add Questran  Abscess Resolved without use of antibiotics  Anxiety and depression Very tearful in visit and acknowledges having more trouble with stress since his diagnosis. Consider an SSRI at next visit.

## 2013-07-28 ENCOUNTER — Telehealth: Payer: Self-pay | Admitting: *Deleted

## 2013-07-28 LAB — HEPATIC FUNCTION PANEL
ALT: 21 U/L (ref 0–53)
AST: 18 U/L (ref 0–37)
Albumin: 4.5 g/dL (ref 3.5–5.2)
Bilirubin, Direct: 0.1 mg/dL (ref 0.0–0.3)
Indirect Bilirubin: 0.4 mg/dL (ref 0.0–0.9)
Total Bilirubin: 0.5 mg/dL (ref 0.3–1.2)
Total Protein: 7.2 g/dL (ref 6.0–8.3)

## 2013-07-28 LAB — RENAL FUNCTION PANEL
Albumin: 4.5 g/dL (ref 3.5–5.2)
CO2: 26 mEq/L (ref 19–32)
Creat: 0.86 mg/dL (ref 0.50–1.35)
Glucose, Bld: 86 mg/dL (ref 70–99)
Phosphorus: 3.9 mg/dL (ref 2.3–4.6)
Potassium: 4.3 mEq/L (ref 3.5–5.3)
Sodium: 138 mEq/L (ref 135–145)

## 2013-07-28 LAB — FECAL LACTOFERRIN, QUANT: Lactoferrin: NEGATIVE

## 2013-07-28 LAB — URINE CULTURE
Colony Count: NO GROWTH
Organism ID, Bacteria: NO GROWTH

## 2013-07-28 LAB — OVA AND PARASITE EXAMINATION: OP: NONE SEEN

## 2013-07-28 NOTE — Telephone Encounter (Signed)
Patient requesting results of Stool tests/SLS Please Advise.

## 2013-07-28 NOTE — Telephone Encounter (Signed)
Most of the stool tests take a couple days the few that are back are  normal

## 2013-07-29 ENCOUNTER — Other Ambulatory Visit: Payer: Self-pay | Admitting: Family Medicine

## 2013-07-29 LAB — CLOSTRIDIUM DIFFICILE BY PCR: Toxigenic C. Difficile by PCR: NOT DETECTED

## 2013-07-29 NOTE — Telephone Encounter (Signed)
Pt informed earlier.

## 2013-07-30 LAB — URINE CULTURE: Colony Count: NO GROWTH

## 2013-08-01 ENCOUNTER — Encounter: Payer: Self-pay | Admitting: Family Medicine

## 2013-08-01 DIAGNOSIS — F32A Depression, unspecified: Secondary | ICD-10-CM

## 2013-08-01 DIAGNOSIS — F329 Major depressive disorder, single episode, unspecified: Secondary | ICD-10-CM | POA: Insufficient documentation

## 2013-08-01 DIAGNOSIS — R197 Diarrhea, unspecified: Secondary | ICD-10-CM

## 2013-08-01 HISTORY — DX: Diarrhea, unspecified: R19.7

## 2013-08-01 HISTORY — DX: Depression, unspecified: F32.A

## 2013-08-01 NOTE — Assessment & Plan Note (Signed)
Did well immediately after surgery but has been struggling with diarrhea

## 2013-08-01 NOTE — Assessment & Plan Note (Signed)
Very tearful in visit and acknowledges having more trouble with stress since his diagnosis. Consider an SSRI at next visit.

## 2013-08-01 NOTE — Assessment & Plan Note (Signed)
Resolved without use of antibiotics

## 2013-08-01 NOTE — Assessment & Plan Note (Signed)
Stool cultures, stool for Cdiff etc all neg. Encouraged probiotics, benefiber and if persists will add Questran

## 2013-08-02 ENCOUNTER — Ambulatory Visit (INDEPENDENT_AMBULATORY_CARE_PROVIDER_SITE_OTHER): Payer: BC Managed Care – PPO | Admitting: Physician Assistant

## 2013-08-02 ENCOUNTER — Encounter: Payer: Self-pay | Admitting: Physician Assistant

## 2013-08-02 VITALS — BP 138/86 | HR 82 | Temp 97.8°F | Resp 18 | Ht 73.0 in | Wt 255.8 lb

## 2013-08-02 DIAGNOSIS — R109 Unspecified abdominal pain: Secondary | ICD-10-CM

## 2013-08-02 DIAGNOSIS — R197 Diarrhea, unspecified: Secondary | ICD-10-CM

## 2013-08-02 MED ORDER — HYOSCYAMINE SULFATE 0.125 MG SL SUBL: 0.1250 mg | SUBLINGUAL_TABLET | SUBLINGUAL | Status: DC | PRN

## 2013-08-02 NOTE — Assessment & Plan Note (Signed)
Again, labs and imaging negative.  Probiotic.  Fiber supplement.  Rx. Hyoscyamine for abdominal cramping.  Referral to GI.

## 2013-08-02 NOTE — Progress Notes (Signed)
Patient ID: [name], male   DOB: 1975/12/07, 37 y.o.   MRN: [id number]  Patient presents to clinic today c/o continued abdominal pain, cramping and loose stools.  Patient was seen on 07/27/13 by Dr. Abner Greenspan for same complaint.  Full work up including basic labs, H. Pylori, stool culture, O/P, CT Abdomen were all negative.  Thought to be an anxiety component to patient's complaints.  Patient was instructed to take fiber supplement, a daily probiotic, and given Rx for Lexapro and Ativan.  Patient endorses taking Lexapro daily and is noticing a decrease in his overall anxiety levels.  Has only used Ativan once but it made him sleepy.  Is concerned because he is 6 weeks out from partial nephrectomy and needs to get back to work to make money. Denies taking fiber supplement or probiotic, and has not changed his diet.   Patient denies nausea or vomiting, fever.  States percocet helps with abdominal cramping/pains.    Patient is also requesting clearance to go back to work.  I have addressed the fact that his Uroogist should be the one to release him back to work.   Past Medical History  Diagnosis Date  . Cellulitis   . Hx of vasectomy   . Chicken pox   . Renal cell carcinoma 06/01/2013  . Anxiety and depression 08/01/2013  . Diarrhea 08/01/2013    Current Outpatient Prescriptions on File Prior to Visit  Medication Sig Dispense Refill  . EPINEPHrine (EPI-PEN) 0.3 mg/0.3 mL SOAJ injection Inject 0.3 mLs (0.3 mg total) into the muscle once.  1 Device  1  . escitalopram (LEXAPRO) 10 MG tablet Take 1 tablet (10 mg total) by mouth daily.  30 tablet  1  . ibuprofen (ADVIL,MOTRIN) 200 MG tablet Take 600 mg by mouth every 6 (six) hours as needed for pain.      Marland Kitchen LORazepam (ATIVAN) 0.5 MG tablet Take 1 tablet (0.5 mg total) by mouth every 8 (eight) hours as needed for anxiety (insomnia).  30 tablet  0  . oxyCODONE-acetaminophen (PERCOCET) 10-325 MG per tablet Take 1 tablet by mouth every 4 (four) hours as  needed for pain.  30 tablet  0  . tadalafil (CIALIS) 20 MG tablet Take 0.5-1 tablets (10-20 mg total) by mouth every other day as needed for erectile dysfunction.  5 tablet  11  . doxycycline (VIBRA-TABS) 100 MG tablet Take 1 tablet (100 mg total) by mouth 2 (two) times daily.  20 tablet  0   No current facility-administered medications on file prior to visit.    Allergies  Allergen Reactions  . Bee Venom Shortness Of Breath and Swelling    Arm swells  . Hornet Venom Shortness Of Breath and Swelling    Arm swells    Family History  Problem Relation Age of Onset  . Cirrhosis Father   . Colitis Father   . Heart disease Father   . Asthma Father   . Heart attack Other     Paternal Grandparents  . Stroke Other     Paternal Grandparents  . Prostate cancer Paternal Grandfather   . Diabetes Maternal Grandfather   . Alcohol abuse Mother   . Other Brother     Intestinal Fissure  . Healthy Sister   . Asthma Son   . Other Father     Chemical Imbalance  . Other Brother     Chemical Imbalance    History   Social History  . Marital Status: Divorced  Spouse Name: N/A    Number of Children: N/A  . Years of Education: N/A   Social History Main Topics  . Smoking status: Current Every Day Smoker -- 1.00 packs/day for 24 years    Types: Cigarettes  . Smokeless tobacco: Never Used  . Alcohol Use: Yes     Comment: occasional/social  . Drug Use: No  . Sexual Activity: Yes   Other Topics Concern  . None   Social History Narrative  . None   Review of Systems  Constitutional: Negative for fever, chills, weight loss and malaise/fatigue.  Respiratory: Negative for shortness of breath.   Cardiovascular: Negative for chest pain and palpitations.  Gastrointestinal: Positive for heartburn, abdominal pain and diarrhea. Negative for nausea, vomiting, constipation, blood in stool and melena.  Genitourinary: Negative for dysuria, urgency, frequency, hematuria and flank pain.   Psychiatric/Behavioral: Negative for depression, suicidal ideas, hallucinations and substance abuse. The patient is nervous/anxious.    Filed Vitals:   08/02/13 1305  BP: 138/86  Pulse: 82  Temp: 97.8 F (36.6 C)  Resp: 18   Physical Exam  Vitals reviewed. Constitutional: He is oriented to person, place, and time and well-developed, well-nourished, and in no distress.  HENT:  Head: Normocephalic and atraumatic.  Eyes: Conjunctivae are normal.  Neck: Neck supple.  Cardiovascular: Normal rate, regular rhythm, normal heart sounds and intact distal pulses.   Pulmonary/Chest: Effort normal and breath sounds normal.  Abdominal: Soft. Bowel sounds are normal. He exhibits no distension and no mass. There is no rebound and no guarding.  Positive diffuse tenderness to palpation.  Neurological: He is alert and oriented to person, place, and time.  Skin: Skin is warm and dry. No rash noted.     Recent Results (from the past 2160 hour(s))  URINALYSIS, ROUTINE W REFLEX MICROSCOPIC     Status: None   Collection Time    06/01/13  2:29 AM      Result Value Range   Color, Urine YELLOW  YELLOW   APPearance CLEAR  CLEAR   Specific Gravity, Urine 1.009  1.005 - 1.030   pH 6.5  5.0 - 8.0   Glucose, UA NEGATIVE  NEGATIVE mg/dL   Hgb urine dipstick NEGATIVE  NEGATIVE   Bilirubin Urine NEGATIVE  NEGATIVE   Ketones, ur NEGATIVE  NEGATIVE mg/dL   Protein, ur NEGATIVE  NEGATIVE mg/dL   Urobilinogen, UA 0.2  0.0 - 1.0 mg/dL   Nitrite NEGATIVE  NEGATIVE   Leukocytes, UA NEGATIVE  NEGATIVE   Comment: MICROSCOPIC NOT DONE ON URINES WITH NEGATIVE PROTEIN, BLOOD, LEUKOCYTES, NITRITE, OR GLUCOSE <1000 mg/dL.  CBC WITH DIFFERENTIAL     Status: Abnormal   Collection Time    06/01/13  2:40 AM      Result Value Range   WBC 12.9 (*) 4.0 - 10.5 K/uL   RBC 4.89  4.22 - 5.81 MIL/uL   Hemoglobin 16.3  13.0 - 17.0 g/dL   HCT 40.9  81.1 - 91.4 %   MCV 94.7  78.0 - 100.0 fL   MCH 33.3  26.0 - 34.0 pg   MCHC  35.2  30.0 - 36.0 g/dL   RDW 78.2  95.6 - 21.3 %   Platelets 191  150 - 400 K/uL   Neutrophils Relative % 73  43 - 77 %   Neutro Abs 9.4 (*) 1.7 - 7.7 K/uL   Lymphocytes Relative 16  12 - 46 %   Lymphs Abs 2.1  0.7 - 4.0 K/uL  Monocytes Relative 8  3 - 12 %   Monocytes Absolute 1.0  0.1 - 1.0 K/uL   Eosinophils Relative 3  0 - 5 %   Eosinophils Absolute 0.3  0.0 - 0.7 K/uL   Basophils Relative 0  0 - 1 %   Basophils Absolute 0.0  0.0 - 0.1 K/uL  BASIC METABOLIC PANEL     Status: Abnormal   Collection Time    06/01/13  2:40 AM      Result Value Range   Sodium 138  135 - 145 mEq/L   Potassium 3.8  3.5 - 5.1 mEq/L   Chloride 102  96 - 112 mEq/L   CO2 26  19 - 32 mEq/L   Glucose, Bld 127 (*) 70 - 99 mg/dL   BUN 15  6 - 23 mg/dL   Creatinine, Ser 4.09  0.50 - 1.35 mg/dL   Calcium 9.3  8.4 - 81.1 mg/dL   GFR calc non Af Amer 84 (*) >90 mL/min   GFR calc Af Amer >90  >90 mL/min   Comment:            The eGFR has been calculated     using the CKD EPI equation.     This calculation has not been     validated in all clinical     situations.     eGFR's persistently     <90 mL/min signify     possible Chronic Kidney Disease.  BASIC METABOLIC PANEL     Status: Abnormal   Collection Time    06/16/13  1:30 PM      Result Value Range   Sodium 136  135 - 145 mEq/L   Potassium 3.7  3.5 - 5.1 mEq/L   Chloride 101  96 - 112 mEq/L   CO2 24  19 - 32 mEq/L   Glucose, Bld 121 (*) 70 - 99 mg/dL   BUN 13  6 - 23 mg/dL   Creatinine, Ser 9.14  0.50 - 1.35 mg/dL   Calcium 8.9  8.4 - 78.2 mg/dL   GFR calc non Af Amer >90  >90 mL/min   GFR calc Af Amer >90  >90 mL/min   Comment: (NOTE)     The eGFR has been calculated using the CKD EPI equation.     This calculation has not been validated in all clinical situations.     eGFR's persistently <90 mL/min signify possible Chronic Kidney     Disease.  CBC     Status: None   Collection Time    06/16/13  1:30 PM      Result Value Range   WBC 6.3   4.0 - 10.5 K/uL   RBC 4.70  4.22 - 5.81 MIL/uL   Hemoglobin 15.5  13.0 - 17.0 g/dL   HCT 95.6  21.3 - 08.6 %   MCV 95.1  78.0 - 100.0 fL   MCH 33.0  26.0 - 34.0 pg   MCHC 34.7  30.0 - 36.0 g/dL   RDW 57.8  46.9 - 62.9 %   Platelets 212  150 - 400 K/uL  TYPE AND SCREEN     Status: None   Collection Time    06/16/13  1:30 PM      Result Value Range   ABO/RH(D) A POS     Antibody Screen NEG     Sample Expiration 06/20/2013    ABO/RH     Status: None   Collection Time  06/16/13  1:30 PM      Result Value Range   ABO/RH(D) A POS    BASIC METABOLIC PANEL     Status: Abnormal   Collection Time    06/17/13 10:25 AM      Result Value Range   Sodium 134 (*) 135 - 145 mEq/L   Potassium 4.9  3.5 - 5.1 mEq/L   Comment: DELTA CHECK NOTED     REPEATED TO VERIFY     NO VISIBLE HEMOLYSIS   Chloride 102  96 - 112 mEq/L   CO2 27  19 - 32 mEq/L   Glucose, Bld 116 (*) 70 - 99 mg/dL   BUN 11  6 - 23 mg/dL   Creatinine, Ser 1.61  0.50 - 1.35 mg/dL   Calcium 8.4  8.4 - 09.6 mg/dL   GFR calc non Af Amer 82 (*) >90 mL/min   GFR calc Af Amer >90  >90 mL/min   Comment: (NOTE)     The eGFR has been calculated using the CKD EPI equation.     This calculation has not been validated in all clinical situations.     eGFR's persistently <90 mL/min signify possible Chronic Kidney     Disease.  HEMOGLOBIN AND HEMATOCRIT, BLOOD     Status: None   Collection Time    06/17/13 10:25 AM      Result Value Range   Hemoglobin 15.1  13.0 - 17.0 g/dL   HCT 04.5  40.9 - 81.1 %  BASIC METABOLIC PANEL     Status: Abnormal   Collection Time    06/18/13  5:08 AM      Result Value Range   Sodium 137  135 - 145 mEq/L   Potassium 3.4 (*) 3.5 - 5.1 mEq/L   Comment: DELTA CHECK NOTED   Chloride 104  96 - 112 mEq/L   CO2 27  19 - 32 mEq/L   Glucose, Bld 137 (*) 70 - 99 mg/dL   BUN 9  6 - 23 mg/dL   Creatinine, Ser 9.14  0.50 - 1.35 mg/dL   Calcium 8.7  8.4 - 78.2 mg/dL   GFR calc non Af Amer >90  >90 mL/min    GFR calc Af Amer >90  >90 mL/min   Comment: (NOTE)     The eGFR has been calculated using the CKD EPI equation.     This calculation has not been validated in all clinical situations.     eGFR's persistently <90 mL/min signify possible Chronic Kidney     Disease.  HEMOGLOBIN AND HEMATOCRIT, BLOOD     Status: None   Collection Time    06/18/13  5:08 AM      Result Value Range   Hemoglobin 15.1  13.0 - 17.0 g/dL   HCT 95.6  21.3 - 08.6 %  BASIC METABOLIC PANEL     Status: Abnormal   Collection Time    06/19/13  5:30 AM      Result Value Range   Sodium 140  135 - 145 mEq/L   Potassium 4.0  3.5 - 5.1 mEq/L   Chloride 105  96 - 112 mEq/L   CO2 31  19 - 32 mEq/L   Glucose, Bld 101 (*) 70 - 99 mg/dL   BUN 9  6 - 23 mg/dL   Creatinine, Ser 5.78  0.50 - 1.35 mg/dL   Calcium 9.1  8.4 - 46.9 mg/dL   GFR calc non Af Amer 75 (*) >90 mL/min  GFR calc Af Amer 86 (*) >90 mL/min   Comment: (NOTE)     The eGFR has been calculated using the CKD EPI equation.     This calculation has not been validated in all clinical situations.     eGFR's persistently <90 mL/min signify possible Chronic Kidney     Disease.  CBC     Status: None   Collection Time    06/19/13  5:30 AM      Result Value Range   WBC 8.5  4.0 - 10.5 K/uL   RBC 4.34  4.22 - 5.81 MIL/uL   Hemoglobin 14.1  13.0 - 17.0 g/dL   HCT 16.1  09.6 - 04.5 %   MCV 96.3  78.0 - 100.0 fL   MCH 32.5  26.0 - 34.0 pg   MCHC 33.7  30.0 - 36.0 g/dL   RDW 40.9  81.1 - 91.4 %   Platelets 178  150 - 400 K/uL  CREATININE, BODY FLUID     Status: None   Collection Time    06/19/13  6:29 AM      Result Value Range   Creat, Fluid 1.1     Comment: NO NORMAL RANGE ESTABLISHED FOR THIS TEST     Performed at [facility]   Fluid Type-FCRE DRAINAGE    OVA AND PARASITE EXAMINATION     Status: None   Collection Time    07/27/13 12:00 AM      Result Value Range   OP No Ova or Parasites Seen     POCT URINALYSIS DIPSTICK     Status: None    Collection Time    07/27/13 11:21 AM      Result Value Range   Color, UA light yellow     Clarity, UA clear     Glucose, UA neg     Bilirubin, UA neg     Ketones, UA neg     Spec Grav, UA <=1.005     Blood, UA neg     pH, UA 8.0     Protein, UA neg     Urobilinogen, UA 0.2     Nitrite, UA neg     Leukocytes, UA Negative    CLOSTRIDIUM DIFFICILE BY PCR     Status: None   Collection Time    07/27/13 12:01 PM      Result Value Range   C difficile by pcr Not Detected  Not Detected   Comment:       This assay detects the presence of Clostridium difficile DNA coding     for toxin B (tcdB) by real-time polymerase chain reaction (PCR)     amplification.     This test was developed and its performance characteristics have been     determined by [employer]. Performance characteristics refer     to the analytical performance of the test. This test has not been     cleared or approved by the [place] Food and Drug Administration. The FDA     has determined that such clearance or approval is not necessary. This     laboratory is certified under the Clinical Laboratory Improvement     Amendments of 1988 as qualified to perform high complexity clinical     laboratory testing.  STOOL CULTURE     Status: None   Collection Time    07/27/13 12:01 PM      Result Value Range   Organism ID, Bacteria No Salmonella,Shigella,Campylobacter,Yersinia,or  Organism ID, Bacteria No E.coli [id number]:H7 isolated.    FECAL LACTOFERRIN     Status: None   Collection Time    07/27/13 12:01 PM      Result Value Range   Lactoferrin NEGATIVE    URINE CULTURE     Status: None   Collection Time    07/27/13 12:01 PM      Result Value Range   Colony Count NO GROWTH     Organism ID, Bacteria NO GROWTH    H. PYLORI ANTIBODY, IGG     Status: None   Collection Time    07/27/13 12:37 PM      Result Value Range   H Pylori IgG 0.49     Comment: No significant level of IgG antibody to H. pylori detected.               ISR = Immune Status Ratio                  <0.90         ISR       Negative                  0.90 - 1.09   ISR       Equivocal                  >=1.10        ISR       Positive           The above results were obtained with the Immulite 2000 H. pylori IgG     EIA.  Results obtained from other manufacturer's assay methods may not     be used interchangeably.        CBC     Status: Abnormal   Collection Time    07/27/13 12:37 PM      Result Value Range   WBC 7.4  4.0 - 10.5 K/uL   RBC 5.02  4.22 - 5.81 MIL/uL   Hemoglobin 16.8  13.0 - 17.0 g/dL   HCT 81.1  91.4 - 78.2 %   MCV 92.2  78.0 - 100.0 fL   MCH 33.5  26.0 - 34.0 pg   MCHC 36.3 (*) 30.0 - 36.0 g/dL   RDW 95.6  21.3 - 08.6 %   Platelets 239  150 - 400 K/uL  RENAL FUNCTION PANEL     Status: None   Collection Time    07/27/13 12:37 PM      Result Value Range   Sodium 138  135 - 145 mEq/L   Potassium 4.3  3.5 - 5.3 mEq/L   Chloride 105  96 - 112 mEq/L   CO2 26  19 - 32 mEq/L   Glucose, Bld 86  70 - 99 mg/dL   BUN 10  6 - 23 mg/dL   Creat 5.78  4.69 - 6.29 mg/dL   Albumin 4.5  3.5 - 5.2 g/dL   Calcium 9.4  8.4 - 52.8 mg/dL   Phosphorus 3.9  2.3 - 4.6 mg/dL  HEPATIC FUNCTION PANEL     Status: None   Collection Time    07/27/13 12:37 PM      Result Value Range   Total Bilirubin 0.5  0.3 - 1.2 mg/dL   Bilirubin, Direct 0.1  0.0 - 0.3 mg/dL   Indirect Bilirubin 0.4  0.0 - 0.9 mg/dL   Alkaline Phosphatase 51  39 - 117 U/L   AST 18  0 - 37 U/L   ALT 21  0 - 53 U/L   Total Protein 7.2  6.0 - 8.3 g/dL   Albumin 4.5  3.5 - 5.2 g/dL  SEDIMENTATION RATE     Status: None   Collection Time    07/27/13 12:37 PM      Result Value Range   Sed Rate 1  0 - 16 mm/hr    Assessment/Plan: Diarrhea Again, labs and imaging negative.  Probiotic.  Fiber supplement.  Rx. Hyoscyamine for abdominal cramping.  Referral to GI.

## 2013-08-02 NOTE — Patient Instructions (Signed)
Please speak with urology about clearance to go back to work.  Please take medications as prescribed.  Daily fiber supplement and probiotic.  Please return to clinic in 1 month or sooner if needed.  You will get a call from a Gastroenterology office.

## 2013-08-03 ENCOUNTER — Encounter: Payer: Self-pay | Admitting: Gastroenterology

## 2013-08-03 NOTE — Progress Notes (Signed)
Quick Note:  Patient Informed and voiced understanding ______ 

## 2013-08-06 ENCOUNTER — Other Ambulatory Visit: Payer: BC Managed Care – PPO

## 2013-08-06 ENCOUNTER — Encounter: Payer: Self-pay | Admitting: Gastroenterology

## 2013-08-06 ENCOUNTER — Ambulatory Visit (INDEPENDENT_AMBULATORY_CARE_PROVIDER_SITE_OTHER): Payer: BC Managed Care – PPO | Admitting: Gastroenterology

## 2013-08-06 VITALS — BP 134/84 | HR 84 | Ht 71.5 in | Wt 254.1 lb

## 2013-08-06 DIAGNOSIS — G8929 Other chronic pain: Secondary | ICD-10-CM

## 2013-08-06 DIAGNOSIS — K625 Hemorrhage of anus and rectum: Secondary | ICD-10-CM

## 2013-08-06 DIAGNOSIS — R197 Diarrhea, unspecified: Secondary | ICD-10-CM

## 2013-08-06 DIAGNOSIS — R1013 Epigastric pain: Secondary | ICD-10-CM

## 2013-08-06 MED ORDER — MOVIPREP 100 G PO SOLR: 1.0000 | Freq: Once | ORAL | Status: DC

## 2013-08-06 MED ORDER — OXYCODONE-ACETAMINOPHEN 10-325 MG PO TABS: 1.0000 | ORAL_TABLET | ORAL | Status: DC | PRN

## 2013-08-06 NOTE — Patient Instructions (Addendum)
One of your biggest health concerns is your smoking.  This increases your risk for most cancers and serious cardiovascular diseases such as strokes, heart attacks.  You should try your best to stop.  If you need assistance, please contact your PCP or Smoking Cessation Class at [facility] [phone]) or [facility] Quit-Line (1-800-QUIT-NOW). You will be set up for a colonoscopy for diarrhea, rectal bleeding.  You will be set up for an upper endoscopy for epigastric pain. ([facility] sedation, [facility] or [facility] whichever is sooner). You will have labs checked today in the basement lab.  Please head down after you check out with the front desk (celiac sprue). Script for percocet (25 pills). Start once daily antiacid medicine (omeprazole OTC once daily).

## 2013-08-06 NOTE — Progress Notes (Signed)
HPI: This is a   very pleasant, anxious [name] whom I am meeting for the first time today.  For the past 2 weeks hes had diarrhea, cold sweats. He has pain both flanks.  He has pain in epigastrium. He is fatigued.  None of the pains are related to bowel movements, none of the pains are related to eating. He has had no nausea or vomiting.  Diarrhea improved today, up to 4-5 times per day.  Sometimes nocturnal diarrhea.    Was having left flank pains prior to renal caner.  CT 05/2013 found small renal lesion MRI 05/2013 confirmed, lesion, suspicious for cancer.  Saw urology  Underwent partial nephrectomy 6 weeks ago. Dr. Bordin. CT 07/2013 for abd pain essentially normal  Labs 07/2013 cbc, cmet, sed rate, h pylori, stool for c. Diff, stool for ova/parasites, stool for routine culture, stool for lactoferrin:  All negative  GF has had colitis, polyps His sister has had polyps removed Brother had polyp Father had some sort of colitis, associated with nerves.  He was taking BC powder, last was 2 months ago.  He has prescribed oxycodones currently.      Review of systems: Pertinent positive and negative review of systems were noted in the above HPI section. Complete review of systems was performed and was otherwise normal.    Past Medical History  Diagnosis Date  . Cellulitis   . Hx of vasectomy   . Chicken pox   . Renal cell carcinoma 06/01/2013  . Anxiety and depression 08/01/2013  . Diarrhea 08/01/2013    Past Surgical History  Procedure Laterality Date  . Wrist surgery Left middle school    "arteries and nerves tangled up"  . Vasectomy  2012  . Wisdom tooth extraction  middle school  . Robotic assited partial nephrectomy Left 06/17/2013    Procedure: ROBOTIC ASSITED PARTIAL NEPHRECTOMY;  Surgeon: Les Borden, MD;  Location: [facility];  Service: Urology;  Laterality: Left;    Current Outpatient Prescriptions  Medication Sig Dispense Refill  . EPINEPHrine  (EPI-PEN) 0.3 mg/0.3 mL SOAJ injection Inject 0.3 mLs (0.3 mg total) into the muscle once.  1 Device  1  . escitalopram (LEXAPRO) 10 MG tablet Take 1 tablet (10 mg total) by mouth daily.  30 tablet  1  . ibuprofen (ADVIL,MOTRIN) 200 MG tablet Take 600 mg by mouth every 6 (six) hours as needed for pain.      . LORazepam (ATIVAN) 0.5 MG tablet Take 1 tablet (0.5 mg total) by mouth every 8 (eight) hours as needed for anxiety (insomnia).  30 tablet  0  . oxyCODONE-acetaminophen (PERCOCET) 10-325 MG per tablet Take 1 tablet by mouth every 4 (four) hours as needed for pain.  30 tablet  0  . tadalafil (CIALIS) 20 MG tablet Take 0.5-1 tablets (10-20 mg total) by mouth every other day as needed for erectile dysfunction.  5 tablet  11  . doxycycline (VIBRA-TABS) 100 MG tablet Take 1 tablet (100 mg total) by mouth 2 (two) times daily.  20 tablet  0   No current facility-administered medications for this visit.    Allergies as of 08/06/2013 - Review Complete 08/06/2013  Allergen Reaction Noted  . Bee venom Shortness Of Breath and Swelling 10/17/2012  . Hornet venom Shortness Of Breath and Swelling 10/17/2012    Family History  Problem Relation Age of Onset  . Cirrhosis Father   . Colitis Father   . Heart disease Father   . Asthma Father   .   Heart attack Other     Paternal Grandparents  . Stroke Other     Paternal Grandparents  . Prostate cancer Paternal Grandfather   . Diabetes Maternal Grandfather   . Alcohol abuse Mother   . Other Brother     Intestinal Fissure  . Colon polyps Sister     intestinal problems  . Asthma Son   . Other Father     Chemical Imbalance  . Other Brother     Chemical Imbalance    History   Social History  . Marital Status: Divorced    Spouse Name: N/A    Number of Children: 3  . Years of Education: N/A   Occupational History  .     Social History Main Topics  . Smoking status: Current Every Day Smoker -- 1.00 packs/day for 24 years    Types:  Cigarettes  . Smokeless tobacco: Never Used  . Alcohol Use: Yes     Comment: occasional/social  . Drug Use: No  . Sexual Activity: Yes   Other Topics Concern  . Not on file   Social History Narrative  . No narrative on file       Physical Exam: BP 134/84  Pulse 84  Ht 5' 11.5" (1.816 m)  Wt 254 lb 2 oz (115.27 kg)  BMI 34.95 kg/m2 Constitutional: generally well-appearing Psychiatric: alert and oriented x3 Eyes: extraocular movements intact Mouth: oral pharynx moist, no lesions Neck: supple no lymphadenopathy Cardiovascular: heart regular rate and rhythm Lungs: clear to auscultation bilaterally Abdomen: soft, nontender, nondistended, no obvious ascites, no peritoneal signs, normal bowel sounds Extremities: no lower extremity edema bilaterally Skin: no lesions on visible extremities    Assessment and plan: 37 y.o. male with  epigastric pain, flank pain, changes in his bowels, minor rectal bleeding  He is of [place], [place] heritage and I want to check him for celiac sprue by serology. If this is positive and it is very likely the cause of most of his GI symptoms. I explained to him that his bilateral flank pains are likely not related to his GI tract. Like to proceed with colonoscopy for his diarrhea, rectal bleeding. Perhaps he has underlying colitis. At the same time all plan to examine his stomach for his epigastric pain, we'll do special duodenal biopsies if he is positive for sprue by serology. I have given him a limited supply of Percocet. We brought the possibility of this is stress related. He has clearly been through a lot of stress with his diagnosis of cancer, he is 37 and is a single dad raising 3 children. That seems to me to be a recipe for extreme stress which can manifest a multitude of ways.     

## 2013-08-09 LAB — CELIAC PANEL 10
Tissue Transglut Ab: 5.4 U/mL (ref <20)
Tissue Transglutaminase Ab, IgA: 3.1 U/mL (ref <20)

## 2013-08-10 ENCOUNTER — Ambulatory Visit: Payer: BC Managed Care – PPO | Admitting: Family Medicine

## 2013-08-10 ENCOUNTER — Encounter (HOSPITAL_COMMUNITY): Payer: Self-pay | Admitting: Pharmacy Technician

## 2013-08-12 ENCOUNTER — Encounter (HOSPITAL_COMMUNITY): Payer: Self-pay | Admitting: *Deleted

## 2013-08-19 ENCOUNTER — Encounter (HOSPITAL_COMMUNITY): Payer: Self-pay

## 2013-08-19 ENCOUNTER — Encounter (HOSPITAL_COMMUNITY)
Admission: RE | Disposition: A | Discharge status: Home / Self Care | Payer: Self-pay | Source: Ambulatory Visit | Attending: Gastroenterology

## 2013-08-19 ENCOUNTER — Encounter (HOSPITAL_COMMUNITY): Payer: BC Managed Care – PPO | Admitting: Anesthesiology

## 2013-08-19 ENCOUNTER — Ambulatory Visit (HOSPITAL_COMMUNITY)
Admission: RE | Admit: 2013-08-19 | Discharge: 2013-08-19 | Disposition: A | Discharge status: Home / Self Care | Payer: BC Managed Care – PPO | Source: Ambulatory Visit | Attending: Gastroenterology | Admitting: Gastroenterology

## 2013-08-19 ENCOUNTER — Ambulatory Visit (HOSPITAL_COMMUNITY): Payer: BC Managed Care – PPO | Admitting: Anesthesiology

## 2013-08-19 DIAGNOSIS — F17200 Nicotine dependence, unspecified, uncomplicated: Secondary | ICD-10-CM | POA: Insufficient documentation

## 2013-08-19 DIAGNOSIS — R197 Diarrhea, unspecified: Secondary | ICD-10-CM | POA: Insufficient documentation

## 2013-08-19 DIAGNOSIS — Z79899 Other long term (current) drug therapy: Secondary | ICD-10-CM | POA: Insufficient documentation

## 2013-08-19 DIAGNOSIS — K2970 Gastritis, unspecified, without bleeding: Secondary | ICD-10-CM

## 2013-08-19 DIAGNOSIS — K625 Hemorrhage of anus and rectum: Secondary | ICD-10-CM | POA: Insufficient documentation

## 2013-08-19 DIAGNOSIS — K2940 Chronic atrophic gastritis without bleeding: Secondary | ICD-10-CM | POA: Insufficient documentation

## 2013-08-19 DIAGNOSIS — R61 Generalized hyperhidrosis: Secondary | ICD-10-CM | POA: Insufficient documentation

## 2013-08-19 DIAGNOSIS — Z8371 Family history of colonic polyps: Secondary | ICD-10-CM | POA: Insufficient documentation

## 2013-08-19 DIAGNOSIS — D126 Benign neoplasm of colon, unspecified: Secondary | ICD-10-CM | POA: Insufficient documentation

## 2013-08-19 DIAGNOSIS — R109 Unspecified abdominal pain: Secondary | ICD-10-CM

## 2013-08-19 DIAGNOSIS — R5381 Other malaise: Secondary | ICD-10-CM | POA: Insufficient documentation

## 2013-08-19 DIAGNOSIS — Z83719 Family history of colon polyps, unspecified: Secondary | ICD-10-CM | POA: Insufficient documentation

## 2013-08-19 DIAGNOSIS — K5730 Diverticulosis of large intestine without perforation or abscess without bleeding: Secondary | ICD-10-CM

## 2013-08-19 DIAGNOSIS — R198 Other specified symptoms and signs involving the digestive system and abdomen: Secondary | ICD-10-CM | POA: Insufficient documentation

## 2013-08-19 DIAGNOSIS — G8929 Other chronic pain: Secondary | ICD-10-CM

## 2013-08-19 HISTORY — PX: ESOPHAGOGASTRODUODENOSCOPY (EGD) WITH PROPOFOL: SHX5813

## 2013-08-19 HISTORY — PX: COLONOSCOPY WITH PROPOFOL: SHX5780

## 2013-08-19 SURGERY — ESOPHAGOGASTRODUODENOSCOPY (EGD) WITH PROPOFOL
Anesthesia: Monitor Anesthesia Care

## 2013-08-19 MED ORDER — LACTATED RINGERS IV SOLN
INTRAVENOUS | Status: DC | PRN
  Administered 2013-08-19: 13:00:00 via INTRAVENOUS

## 2013-08-19 MED ORDER — FENTANYL CITRATE 0.05 MG/ML IJ SOLN: 25.0000 ug | INTRAMUSCULAR | Status: DC | PRN

## 2013-08-19 MED ORDER — SODIUM CHLORIDE 0.9 % IV SOLN: INTRAVENOUS | Status: DC

## 2013-08-19 MED ORDER — FENTANYL CITRATE 0.05 MG/ML IJ SOLN
INTRAMUSCULAR | Status: DC | PRN
  Administered 2013-08-19: 100 ug via INTRAVENOUS

## 2013-08-19 MED ORDER — ONDANSETRON HCL 4 MG/2ML IJ SOLN
INTRAMUSCULAR | Status: DC | PRN
  Administered 2013-08-19: 4 mg via INTRAVENOUS

## 2013-08-19 MED ORDER — PROPOFOL INFUSION 10 MG/ML OPTIME
INTRAVENOUS | Status: DC | PRN
  Administered 2013-08-19: 180 ug/kg/min via INTRAVENOUS

## 2013-08-19 MED ORDER — KETAMINE HCL 10 MG/ML IJ SOLN
INTRAMUSCULAR | Status: DC | PRN
  Administered 2013-08-19 (×2): 10 mg via INTRAVENOUS

## 2013-08-19 MED ORDER — LIDOCAINE HCL 1 % IJ SOLN
INTRAMUSCULAR | Status: DC | PRN
  Administered 2013-08-19: 50 mg via INTRADERMAL

## 2013-08-19 MED ORDER — MIDAZOLAM HCL 5 MG/5ML IJ SOLN
INTRAMUSCULAR | Status: DC | PRN
  Administered 2013-08-19: 2 mg via INTRAVENOUS

## 2013-08-19 SURGICAL SUPPLY — 24 items

## 2013-08-19 NOTE — Interval H&P Note (Signed)
History and Physical Interval Note:  08/19/2013 1:24 PM  [name]  has presented today for surgery, with the diagnosis of Rectal bleeding [569.3] Diarrhea [787.91]  The various methods of treatment have been discussed with the patient and family. After consideration of risks, benefits and other options for treatment, the patient has consented to  Procedure(s): ESOPHAGOGASTRODUODENOSCOPY (EGD) WITH PROPOFOL (N/A) COLONOSCOPY WITH PROPOFOL (N/A) as a surgical intervention .  The patient's history has been reviewed, patient examined, no change in status, stable for surgery.  I have reviewed the patient's chart and labs.  Questions were answered to the patient's satisfaction.     Rachael Fee

## 2013-08-19 NOTE — Op Note (Signed)
[facility] [address] [place], [address]   ENDOSCOPY PROCEDURE REPORT  PATIENT: Todd, Leon  MR#: [id number] BIRTHDATE: 07/19/1976 , 37  yrs. old GENDER: Male ENDOSCOPIST: Rachael Fee, MD PROCEDURE DATE:  08/19/2013 PROCEDURE:  EGD w/ biopsy ASA CLASS:     Class II INDICATIONS:  abdominal pain, also flank pains. MEDICATIONS: MAC sedation, administered by CRNA TOPICAL ANESTHETIC: none  DESCRIPTION OF PROCEDURE: After the risks benefits and alternatives of the procedure were thoroughly explained, informed consent was obtained.  The Pentax Gastroscope [id number] endoscope was introduced through the mouth and advanced to the second portion of the duodenum. Without limitations.  The instrument was slowly withdrawn as the mucosa was fully examined.     There was mild, non-specific distal gastritis.  Biopsies were taken and sent to pathology.  The examination was otherwise normal. Retroflexed views revealed no abnormalities.     The scope was then withdrawn from the patient and the procedure completed. COMPLICATIONS: There were no complications.  ENDOSCOPIC IMPRESSION: There was mild, non-specific distal gastritis.  Biopsies were taken and sent to pathology.  The examination was otherwise normal.  RECOMMENDATIONS: Continue once daily PPI (omeprazole)   eSigned:  Rachael Fee, MD 08/19/2013 2:30 PM   CC:  Marcelline Mates, MD

## 2013-08-19 NOTE — Transfer of Care (Signed)
Immediate Anesthesia Transfer of Care Note  Patient: [name]  Procedure(s) Performed: Procedure(s): ESOPHAGOGASTRODUODENOSCOPY (EGD) WITH PROPOFOL (N/A) COLONOSCOPY WITH PROPOFOL (N/A)  Patient Location: PACU and Endoscopy Unit  Anesthesia Type:MAC  Level of Consciousness: awake, sedated and patient cooperative  Airway & Oxygen Therapy: Patient Spontanous Breathing and Patient connected to nasal cannula oxygen  Post-op Assessment: Report given to PACU RN, Post -op Vital signs reviewed and stable and Patient moving all extremities  Post vital signs: Reviewed and stable  Complications: No apparent anesthesia complications

## 2013-08-19 NOTE — H&P (View-Only) (Signed)
HPI: This is a   very pleasant, anxious 37 year old man whom I am meeting for the first time today.  For the past 2 weeks hes had diarrhea, cold sweats. He has pain both flanks.  He has pain in epigastrium. He is fatigued.  None of the pains are related to bowel movements, none of the pains are related to eating. He has had no nausea or vomiting.  Diarrhea improved today, up to 4-5 times per day.  Sometimes nocturnal diarrhea.    Was having left flank pains prior to renal caner.  CT 05/2013 found small renal lesion MRI 05/2013 confirmed, lesion, suspicious for cancer.  Saw urology  Underwent partial nephrectomy 6 weeks ago. Dr. Ludwig Lean. CT 07/2013 for abd pain essentially normal  Labs 07/2013 cbc, cmet, sed rate, h pylori, stool for c. Diff, stool for ova/parasites, stool for routine culture, stool for lactoferrin:  All negative  GF has had colitis, polyps His sister has had polyps removed Brother had polyp Father had some sort of colitis, associated with nerves.  He was taking BC powder, last was 2 months ago.  He has prescribed oxycodones currently.      Review of systems: Pertinent positive and negative review of systems were noted in the above HPI section. Complete review of systems was performed and was otherwise normal.    Past Medical History  Diagnosis Date  . Cellulitis   . Hx of vasectomy   . Chicken pox   . Renal cell carcinoma 06/01/2013  . Anxiety and depression 08/01/2013  . Diarrhea 08/01/2013    Past Surgical History  Procedure Laterality Date  . Wrist surgery Left middle school    "arteries and nerves tangled up"  . Vasectomy  2012  . Wisdom tooth extraction  middle school  . Robotic assited partial nephrectomy Left 06/17/2013    Procedure: ROBOTIC ASSITED PARTIAL NEPHRECTOMY;  Surgeon: Crecencio Mc, MD;  Location: [facility];  Service: Urology;  Laterality: Left;    Current Outpatient Prescriptions  Medication Sig Dispense Refill  . EPINEPHrine  (EPI-PEN) 0.3 mg/0.3 mL SOAJ injection Inject 0.3 mLs (0.3 mg total) into the muscle once.  1 Device  1  . escitalopram (LEXAPRO) 10 MG tablet Take 1 tablet (10 mg total) by mouth daily.  30 tablet  1  . ibuprofen (ADVIL,MOTRIN) 200 MG tablet Take 600 mg by mouth every 6 (six) hours as needed for pain.      Marland Kitchen LORazepam (ATIVAN) 0.5 MG tablet Take 1 tablet (0.5 mg total) by mouth every 8 (eight) hours as needed for anxiety (insomnia).  30 tablet  0  . oxyCODONE-acetaminophen (PERCOCET) 10-325 MG per tablet Take 1 tablet by mouth every 4 (four) hours as needed for pain.  30 tablet  0  . tadalafil (CIALIS) 20 MG tablet Take 0.5-1 tablets (10-20 mg total) by mouth every other day as needed for erectile dysfunction.  5 tablet  11  . doxycycline (VIBRA-TABS) 100 MG tablet Take 1 tablet (100 mg total) by mouth 2 (two) times daily.  20 tablet  0   No current facility-administered medications for this visit.    Allergies as of 08/06/2013 - Review Complete 08/06/2013  Allergen Reaction Noted  . Bee venom Shortness Of Breath and Swelling 10/17/2012  . Hornet venom Shortness Of Breath and Swelling 10/17/2012    Family History  Problem Relation Age of Onset  . Cirrhosis Father   . Colitis Father   . Heart disease Father   . Asthma Father   .  Heart attack Other     Paternal Grandparents  . Stroke Other     Paternal Grandparents  . Prostate cancer Paternal Grandfather   . Diabetes Maternal Grandfather   . Alcohol abuse Mother   . Other Brother     Intestinal Fissure  . Colon polyps Sister     intestinal problems  . Asthma Son   . Other Father     Chemical Imbalance  . Other Brother     Chemical Imbalance    History   Social History  . Marital Status: Divorced    Spouse Name: N/A    Number of Children: 3  . Years of Education: N/A   Occupational History  .     Social History Main Topics  . Smoking status: Current Every Day Smoker -- 1.00 packs/day for 24 years    Types:  Cigarettes  . Smokeless tobacco: Never Used  . Alcohol Use: Yes     Comment: occasional/social  . Drug Use: No  . Sexual Activity: Yes   Other Topics Concern  . Not on file   Social History Narrative  . No narrative on file       Physical Exam: BP 134/84  Pulse 84  Ht 5' 11.5" (1.816 m)  Wt 254 lb 2 oz (115.27 kg)  BMI 34.95 kg/m2 Constitutional: generally well-appearing Psychiatric: alert and oriented x3 Eyes: extraocular movements intact Mouth: oral pharynx moist, no lesions Neck: supple no lymphadenopathy Cardiovascular: heart regular rate and rhythm Lungs: clear to auscultation bilaterally Abdomen: soft, nontender, nondistended, no obvious ascites, no peritoneal signs, normal bowel sounds Extremities: no lower extremity edema bilaterally Skin: no lesions on visible extremities    Assessment and plan: 37 y.o. male with  epigastric pain, flank pain, changes in his bowels, minor rectal bleeding  He is of [place] heritage and I want to check him for celiac sprue by serology. If this is positive and it is very likely the cause of most of his GI symptoms. I explained to him that his bilateral flank pains are likely not related to his GI tract. Like to proceed with colonoscopy for his diarrhea, rectal bleeding. Perhaps he has underlying colitis. At the same time all plan to examine his stomach for his epigastric pain, we'll do special duodenal biopsies if he is positive for sprue by serology. I have given him a limited supply of Percocet. We brought the possibility of this is stress related. He has clearly been through a lot of stress with his diagnosis of cancer, he is 69 and is a single dad raising 3 children. That seems to me to be a recipe for extreme stress which can manifest a multitude of ways.

## 2013-08-19 NOTE — Anesthesia Preprocedure Evaluation (Signed)
Anesthesia Evaluation  Patient identified by MRN, date of birth, ID band Patient awake    Reviewed: Allergy & Precautions, H&P , NPO status , Patient's Chart, lab work & pertinent test results  Airway Mallampati: II TM Distance: >3 FB Neck ROM: Full    Dental no notable dental hx.    Pulmonary Current Smoker,  breath sounds clear to auscultation  Pulmonary exam normal       Cardiovascular Exercise Tolerance: Good negative cardio ROS  Rhythm:Regular Rate:Normal     Neuro/Psych PSYCHIATRIC DISORDERS negative neurological ROS     GI/Hepatic negative GI ROS, Neg liver ROS,   Endo/Other  negative endocrine ROS  Renal/GU Renal diseaseH/O Renal cell carcinoma  negative genitourinary   Musculoskeletal negative musculoskeletal ROS (+)   Abdominal (+) + obese,   Peds negative pediatric ROS (+)  Hematology negative hematology ROS (+)   Anesthesia Other Findings   Reproductive/Obstetrics negative OB ROS                           Anesthesia Physical Anesthesia Plan  ASA: II  Anesthesia Plan: MAC   Post-op Pain Management:    Induction: Intravenous  Airway Management Planned:   Additional Equipment:   Intra-op Plan:   Post-operative Plan:   Informed Consent: I have reviewed the patients History and Physical, chart, labs and discussed the procedure including the risks, benefits and alternatives for the proposed anesthesia with the patient or authorized representative who has indicated his/her understanding and acceptance.   Dental advisory given  Plan Discussed with: CRNA  Anesthesia Plan Comments:         Anesthesia Quick Evaluation

## 2013-08-19 NOTE — Op Note (Signed)
[facility] [address] [place], [address]   COLONOSCOPY PROCEDURE REPORT  PATIENT: [name], [name]  MR#: [id number] BIRTHDATE: [birth year] , 37  yrs. old GENDER: Male ENDOSCOPIST: Rachael Fee, MD REFERRED [id number] Daphine Deutscher, M.D. PROCEDURE DATE:  08/19/2013 PROCEDURE:   Colonoscopy with biopsy and Colonoscopy with snare polypectomy First Screening Colonoscopy - Avg.  risk and is 50 yrs.  old or older - No.  Prior Negative Screening - Now for repeat screening. N/A  History of Adenoma - Now for follow-up colonoscopy & has been > or = to 3 yrs.  N/A  Polyps Removed Today? Yes. ASA CLASS:   Class II INDICATIONS:minor rectal bleeding, loose than usual stools. MEDICATIONS: MAC sedation, administered by CRNA DESCRIPTION OF PROCEDURE:   After the risks benefits and alternatives of the procedure were thoroughly explained, informed consent was obtained.  A digital rectal exam revealed no abnormalities of the rectum.   The Pentax Pediatric Colonoscope [id number]  endoscope was introduced through the anus and advanced to the terminal ileum which was intubated for a short distance. No adverse events experienced.   The quality of the prep was excellent.  The instrument was then slowly withdrawn as the colon was fully examined.  COLON FINDINGS: The terminal ileum was normal.  There were a few small scattered diverticulum throughout the colon.  Three polyps were found, removed and sent to pathology.  Two were sessile, 2-80mm across, located in ascending and sigmoid segments, removedw with cold snare.  The last was pedunculated, 8mm across, located in rectum, removed with snare/cautery.  All sent to pathology (jar 1). Given his loose stools, random colon biopsies were also taken. The examination was otherwise normal.  Retroflexed views revealed no abnormalities. The time to cecum=3 minutes 00 seconds. Withdrawal time=10 minutes 00 seconds.  The scope was withdrawn and the  procedure completed. COMPLICATIONS: There were no complications.  ENDOSCOPIC IMPRESSION: The terminal ileum was normal.  There were a few small scattered diverticulum throughout the colon.  Three polyps were found, removed and sent to pathology. Given his loose stools, random colon biopsies were also taken.  The examination was otherwise normal.  RECOMMENDATIONS: If the polyp(s) removed today are proven to be adenomatous (pre-cancerous) polyps, you will need a repeat colonoscopy in 5 years.  Otherwise you should continue to follow colorectal cancer screening guidelines for "routine risk" patients with colonoscopy in 10 years.  You will receive a letter within 1-2 weeks with the results of your biopsy as well as final recommendations.  Please call my office if you have not received a letter after 3 weeks. Await random colon biospies as well.  eSigned:  Rachael Fee, MD 08/19/2013 2:26 PM

## 2013-08-20 ENCOUNTER — Encounter (HOSPITAL_COMMUNITY): Payer: Self-pay | Admitting: Gastroenterology

## 2013-08-20 NOTE — Anesthesia Postprocedure Evaluation (Signed)
  Anesthesia Post-op Note  Patient: [name]  Procedure(s) Performed: Procedure(s) (LRB): ESOPHAGOGASTRODUODENOSCOPY (EGD) WITH PROPOFOL (N/A) COLONOSCOPY WITH PROPOFOL (N/A)  Patient Location: PACU  Anesthesia Type: MAC  Level of Consciousness: awake and alert   Airway and Oxygen Therapy: Patient Spontanous Breathing  Post-op Pain: mild  Post-op Assessment: Post-op Vital signs reviewed, Patient's Cardiovascular Status Stable, Respiratory Function Stable, Patent Airway and No signs of Nausea or vomiting  Last Vitals:  Filed Vitals:   08/19/13 1450  BP: 99/69  Pulse:   Temp:   Resp: 12    Post-op Vital Signs: stable   Complications: No apparent anesthesia complications

## 2013-09-03 ENCOUNTER — Ambulatory Visit: Payer: BC Managed Care – PPO | Admitting: Physician Assistant

## 2014-02-02 ENCOUNTER — Emergency Department (HOSPITAL_BASED_OUTPATIENT_CLINIC_OR_DEPARTMENT_OTHER): Payer: BC Managed Care – PPO

## 2014-02-02 ENCOUNTER — Emergency Department (HOSPITAL_BASED_OUTPATIENT_CLINIC_OR_DEPARTMENT_OTHER)
Admission: EM | Admit: 2014-02-02 | Discharge: 2014-02-03 | Disposition: A | Discharge status: Home / Self Care | Payer: BC Managed Care – PPO | Attending: Emergency Medicine | Admitting: Emergency Medicine

## 2014-02-02 ENCOUNTER — Encounter (HOSPITAL_BASED_OUTPATIENT_CLINIC_OR_DEPARTMENT_OTHER): Payer: Self-pay | Admitting: Emergency Medicine

## 2014-02-02 DIAGNOSIS — R0789 Other chest pain: Secondary | ICD-10-CM | POA: Insufficient documentation

## 2014-02-02 DIAGNOSIS — Z79899 Other long term (current) drug therapy: Secondary | ICD-10-CM | POA: Insufficient documentation

## 2014-02-02 DIAGNOSIS — R42 Dizziness and giddiness: Secondary | ICD-10-CM | POA: Insufficient documentation

## 2014-02-02 DIAGNOSIS — M5416 Radiculopathy, lumbar region: Secondary | ICD-10-CM

## 2014-02-02 DIAGNOSIS — R51 Headache: Secondary | ICD-10-CM | POA: Insufficient documentation

## 2014-02-02 DIAGNOSIS — F411 Generalized anxiety disorder: Secondary | ICD-10-CM | POA: Insufficient documentation

## 2014-02-02 DIAGNOSIS — R197 Diarrhea, unspecified: Secondary | ICD-10-CM | POA: Insufficient documentation

## 2014-02-02 DIAGNOSIS — F17200 Nicotine dependence, unspecified, uncomplicated: Secondary | ICD-10-CM | POA: Insufficient documentation

## 2014-02-02 DIAGNOSIS — Z85528 Personal history of other malignant neoplasm of kidney: Secondary | ICD-10-CM | POA: Insufficient documentation

## 2014-02-02 DIAGNOSIS — Z872 Personal history of diseases of the skin and subcutaneous tissue: Secondary | ICD-10-CM | POA: Insufficient documentation

## 2014-02-02 DIAGNOSIS — IMO0002 Reserved for concepts with insufficient information to code with codable children: Secondary | ICD-10-CM | POA: Insufficient documentation

## 2014-02-02 DIAGNOSIS — Z8619 Personal history of other infectious and parasitic diseases: Secondary | ICD-10-CM | POA: Insufficient documentation

## 2014-02-02 DIAGNOSIS — R209 Unspecified disturbances of skin sensation: Secondary | ICD-10-CM | POA: Insufficient documentation

## 2014-02-02 DIAGNOSIS — R110 Nausea: Secondary | ICD-10-CM | POA: Insufficient documentation

## 2014-02-02 LAB — CBC WITH DIFFERENTIAL/PLATELET
Basophils Absolute: 0 10*3/uL (ref 0.0–0.1)
Basophils Relative: 1 % (ref 0–1)
EOS ABS: 0.4 10*3/uL (ref 0.0–0.7)
Eosinophils Relative: 5 % (ref 0–5)
HCT: 44.5 % (ref 39.0–52.0)
Hemoglobin: 15.8 g/dL (ref 13.0–17.0)
LYMPHS PCT: 25 % (ref 12–46)
Lymphs Abs: 2 10*3/uL (ref 0.7–4.0)
MCH: 33.5 pg (ref 26.0–34.0)
MCHC: 35.5 g/dL (ref 30.0–36.0)
MCV: 94.5 fL (ref 78.0–100.0)
Monocytes Absolute: 0.7 10*3/uL (ref 0.1–1.0)
Monocytes Relative: 9 % (ref 3–12)
NEUTROS ABS: 4.8 10*3/uL (ref 1.7–7.7)
Neutrophils Relative %: 60 % (ref 43–77)
PLATELETS: 216 10*3/uL (ref 150–400)
RBC: 4.71 MIL/uL (ref 4.22–5.81)
RDW: 12.3 % (ref 11.5–15.5)
WBC: 7.9 10*3/uL (ref 4.0–10.5)

## 2014-02-02 LAB — BASIC METABOLIC PANEL
BUN: 12 mg/dL (ref 6–23)
CO2: 26 meq/L (ref 19–32)
Calcium: 9.8 mg/dL (ref 8.4–10.5)
Chloride: 104 mEq/L (ref 96–112)
Creatinine, Ser: 1 mg/dL (ref 0.50–1.35)
GFR calc Af Amer: >90 mL/min (ref >90)
Glucose, Bld: 106 mg/dL — ABNORMAL HIGH (ref 70–99)
Potassium: 3.8 mEq/L (ref 3.7–5.3)
SODIUM: 143 meq/L (ref 137–147)

## 2014-02-02 LAB — TROPONIN I: Troponin I: <0.3 ng/mL (ref <0.30)

## 2014-02-02 MED ORDER — ACETAMINOPHEN 500 MG PO TABS
1000.0000 mg | ORAL_TABLET | Freq: Once | ORAL | Status: AC
  Administered 2014-02-02: 1000 mg via ORAL
  Filled 2014-02-02: qty 2

## 2014-02-02 MED ORDER — ASPIRIN 81 MG PO CHEW
324.0000 mg | CHEWABLE_TABLET | Freq: Once | ORAL | Status: AC
  Administered 2014-02-02: 324 mg via ORAL
  Filled 2014-02-02: qty 4

## 2014-02-02 NOTE — ED Notes (Signed)
C/o left side CP intermittent x 1 week-having numbness down left arm today-also c/o numbness to entire left leg x 3-4 days

## 2014-02-02 NOTE — ED Notes (Signed)
Per Baxter Flattery with radiology, pt c/o headache at this time

## 2014-02-02 NOTE — ED Provider Notes (Signed)
CSN: [id number]     Arrival date & time 02/02/14  2139 History  This chart was scribed for Wynetta Fines, MD by Sydell Axon, ED Scribe. This patient was seen in room MH08/MH08 and the patient's care was started at 11:24 PM.  The history is provided by the patient. No language interpreter was used.   HPI Comments: [name] is a 38 y.o. Male,with a history of kidney cancer s/p tumor excision, presents to the Emergency Department with a chief complaint of CP with onset 4 days. He characterizes the pain as an intermittent sharp, well-localized (left parasternal region) that lasts between 30 seconds and 1 minute. This evening he developed a dull chest pain that was more diffuse. He rated the CP as 8/10 when he first arrived which he now rates as 4/10. Patient also reports he has had a headache, lightheadedness and sense of room spinning, with nausea but no vomiting or abdominal pain. He has had a cough and shortness of breath since developing a respiratory infection about 2 weeks ago; he is taking Mucinex for this. Additionally, he had been having diarrhea over the past 2 days. Patient denies any modifying or mitigating factors, specifically no change with exertion or rest. Patient denies diaphoresis with the CP. He denies any neck pain, fever, or chills.  Patient also reports constant paresthesias down the L upper thigh, L lower leg, and L upper arm with an ache in his left elbow; onset 3 days. He rates the severity of the paresthesias as mild but states they are worsening. Patient characterizes the paresthesias in his L foot as a burning sensation whereas the L leg feels like it is "asleep". He denies swelling of his legs or calf pain. Patient denies any recent travel. Patient works as a Curator.   Past Medical History  Diagnosis Date  . Cellulitis     left leg and stomach  . Hx of vasectomy   . Chicken pox   . Anxiety and depression 08/01/2013  . Diarrhea 08/01/2013  . Renal cell carcinoma  06/01/2013   Past Surgical History  Procedure Laterality Date  . Wrist surgery Left middle school    "arteries and nerves tangled up"  . Vasectomy  2012  . Wisdom tooth extraction  middle school  . Robotic assited partial nephrectomy Left 06/17/2013    Procedure: ROBOTIC ASSITED PARTIAL NEPHRECTOMY;  Surgeon: Dutch Gray, MD;  Location: [facility];  Service: Urology;  Laterality: Left;  . Esophagogastroduodenoscopy (egd) with propofol N/A 08/19/2013    Procedure: ESOPHAGOGASTRODUODENOSCOPY (EGD) WITH PROPOFOL;  Surgeon: Milus Banister, MD;  Location: [facility];  Service: Endoscopy;  Laterality: N/A;  . Colonoscopy with propofol N/A 08/19/2013    Procedure: COLONOSCOPY WITH PROPOFOL;  Surgeon: Milus Banister, MD;  Location: [facility];  Service: Endoscopy;  Laterality: N/A;   Family History  Problem Relation Age of Onset  . Cirrhosis Father   . Colitis Father   . Heart disease Father   . Asthma Father   . Heart attack Other     Paternal Grandparents  . Stroke Other     Paternal Grandparents  . Prostate cancer Paternal Grandfather   . Diabetes Maternal Grandfather   . Alcohol abuse Mother   . Other Brother     Intestinal Fissure  . Colon polyps Sister     intestinal problems  . Asthma Son   . Other Father     Chemical Imbalance  . Other Brother     Chemical  Imbalance   History  Substance Use Topics  . Smoking status: Current Every Day Smoker -- 1.00 packs/day for 24 years    Types: Cigarettes  . Smokeless tobacco: Current User    Types: Chew  . Alcohol Use: Yes     Comment: occasional/social    Review of Systems A complete 10 system review of systems was obtained and all systems are negative except as noted in the HPI and PMH.   Allergies  Bee venom and Hornet venom  Home Medications   Prior to Admission medications   Medication Sig Start Date End Date Taking? Authorizing Provider  EPINEPHrine (EPI-PEN) 0.3 mg/0.3 mL SOAJ injection Inject 0.3 mLs (0.3 mg total)  into the muscle once. 06/11/13   Leeanne Rio, PA-C  escitalopram (LEXAPRO) 10 MG tablet Take 10 mg by mouth every evening. 07/27/13   Mosie Lukes, MD  fish oil-omega-3 fatty acids 1000 MG capsule Take 1 g by mouth daily.    Historical Provider, MD  ibuprofen (ADVIL,MOTRIN) 200 MG tablet Take 800 mg by mouth every 6 (six) hours as needed for pain.     Historical Provider, MD  LORazepam (ATIVAN) 0.5 MG tablet Take 0.5 mg by mouth every 8 (eight) hours as needed for anxiety (insomnia). 07/27/13   Mosie Lukes, MD  oxyCODONE-acetaminophen (PERCOCET) 10-325 MG per tablet Take 1 tablet by mouth every 4 (four) hours as needed for pain. 07/27/13   Mosie Lukes, MD  peg 3350 powder (MOVIPREP) 100 G SOLR Take 1 kit by mouth once. 08/06/13   Milus Banister, MD  tadalafil (CIALIS) 20 MG tablet Take 0.5-1 tablets (10-20 mg total) by mouth every other day as needed for erectile dysfunction. 06/11/13   Leeanne Rio, PA-C  tetrahydrozoline 0.05 % ophthalmic solution Place 1 drop into both eyes 2 (two) times daily as needed (dry eyes).    Historical Provider, MD   Triage Vitals: BP 154/106  Pulse 88  Temp(Src) 98.8 F (37.1 C) (Oral)  Resp 20  SpO2 96%  Physical Exam  Nursing note and vitals reviewed. General: Well-developed, well-nourished male in no acute distress; appearance consistent with age of record HENT: normocephalic; atraumatic Eyes: pupils equal, round and reactive to light; extraocular muscles intact Neck: supple Heart: regular rate and rhythm; no murmurs, rubs or gallops Lungs: clear to auscultation bilaterally; coughing Chest: Nontender Abdomen: soft; nondistended; nontender; no masses or hepatosplenomegaly; bowel sounds present GU: No CVA tenderness. Back: Tenderness to L paraspinal region.  Extremities: No deformity; full range of motion; pulses normal. Mild pain in buttock area on L straight leg raise at 40 degrees, negative right straight leg raise; altered sensation  of the L leg primarily on the lateral aspect circumferential in the left lower leg. No edema.  Neurologic: Awake, alert and oriented; motor function intact in all extremities and symmetric; no facial droop Skin: Warm and dry Psychiatric: Normal mood and affect   ED Course  Procedures (including critical care time)  DIAGNOSTIC STUDIES: Oxygen Saturation is 96% on room air, adequate by my interpretation.    MDM   Nursing notes and vitals signs, including pulse oximetry, reviewed.  Summary of this visit's results, reviewed by myself:  Labs:  Results for orders placed during the hospital encounter of 02/02/14 (from the past 24 hour(s))  CBC WITH DIFFERENTIAL     Status: None   Collection Time    02/02/14 10:15 PM      Result Value Ref Range   WBC  7.9  4.0 - 10.5 K/uL   RBC 4.71  4.22 - 5.81 MIL/uL   Hemoglobin 15.8  13.0 - 17.0 g/dL   HCT 44.5  39.0 - 52.0 %   MCV 94.5  78.0 - 100.0 fL   MCH 33.5  26.0 - 34.0 pg   MCHC 35.5  30.0 - 36.0 g/dL   RDW 12.3  11.5 - 15.5 %   Platelets 216  150 - 400 K/uL   Neutrophils Relative % 60  43 - 77 %   Neutro Abs 4.8  1.7 - 7.7 K/uL   Lymphocytes Relative 25  12 - 46 %   Lymphs Abs 2.0  0.7 - 4.0 K/uL   Monocytes Relative 9  3 - 12 %   Monocytes Absolute 0.7  0.1 - 1.0 K/uL   Eosinophils Relative 5  0 - 5 %   Eosinophils Absolute 0.4  0.0 - 0.7 K/uL   Basophils Relative 1  0 - 1 %   Basophils Absolute 0.0  0.0 - 0.1 K/uL  BASIC METABOLIC PANEL     Status: Abnormal   Collection Time    02/02/14 10:15 PM      Result Value Ref Range   Sodium 143  137 - 147 mEq/L   Potassium 3.8  3.7 - 5.3 mEq/L   Chloride 104  96 - 112 mEq/L   CO2 26  19 - 32 mEq/L   Glucose, Bld 106 (*) 70 - 99 mg/dL   BUN 12  6 - 23 mg/dL   Creatinine, Ser 1.00  0.50 - 1.35 mg/dL   Calcium 9.8  8.4 - 10.5 mg/dL   GFR calc non Af Amer >90  >90 mL/min   GFR calc Af Amer >90  >90 mL/min  TROPONIN I     Status: None   Collection Time    02/02/14 10:15 PM       Result Value Ref Range   Troponin I <0.30  <0.30 ng/mL  D-DIMER, QUANTITATIVE     Status: None   Collection Time    02/02/14 10:15 PM      Result Value Ref Range   D-Dimer, Quant <0.27  0.00 - 0.48 ug/mL-FEU  TROPONIN I     Status: None   Collection Time    02/03/14  1:30 AM      Result Value Ref Range   Troponin I <0.30  <0.30 ng/mL  TROPONIN I     Status: None   Collection Time    02/03/14  3:25 AM      Result Value Ref Range   Troponin I <0.30  <0.30 ng/mL    Date: 02/02/2014 9:48 PM  Rate: 83  Rhythm: normal sinus rhythm  QRS Axis: normal  Intervals: normal  ST/T Wave abnormalities: normal  Conduction Disutrbances: none  Narrative Interpretation: unremarkable  Comparison with previous EKG: unchanged  Imaging Studies: Dg Chest 2 View  02/02/2014   CLINICAL DATA:  Left-sided chest pain with left upper extremity and left lower extremity numbness and tingling with headache.  EXAM: CHEST  2 VIEW  COMPARISON:  Prior radiograph from 06/15/2013  FINDINGS: The cardiac and mediastinal silhouettes are stable in size and contour, and remain within normal limits.  The lungs are normally inflated. Minimal bibasilar atelectasis is present. No airspace consolidation, pleural effusion, or pulmonary edema is identified. There is no pneumothorax.  No acute osseous abnormality identified.  IMPRESSION: No acute cardiopulmonary abnormality.   Electronically Signed   By: Marland Kitchen  Jeannine Boga M.D.   On: 02/02/2014 22:53   3:06 AM Patient is pain-free. He has had 2 negative troponins his EKG was normal. We will obtain a third EKG to rule out MI. His story is atypical for cardiac chest pain and his only significant risk factor is smoking. He has no history of hypertension, hyperlipidemia or diabetes. He has no history of early onset coronary artery disease in his family.  4:00 AM Patient has had 3 negative troponins. He is pain-free. His left lower study symptoms are likely due to 2 lumbar  radiculopathy. He is a primary care physician with whom he can follow up, Dr. Araceli Bouche.    I personally performed the services described in this documentation, which was scribed in my presence. The recorded information has been reviewed and is accurate.  Wynetta Fines, MD 02/03/14 [phone]

## 2014-02-03 LAB — TROPONIN I
Troponin I: <0.3 ng/mL (ref <0.30)
Troponin I: <0.3 ng/mL (ref <0.30)

## 2014-02-03 LAB — D-DIMER, QUANTITATIVE (NOT AT ARMC): D-Dimer, Quant: <0.27 ug/mL-FEU (ref 0.00–0.48)

## 2014-02-03 MED ORDER — HYDROCODONE-ACETAMINOPHEN 5-325 MG PO TABS: 1.0000 | ORAL_TABLET | Freq: Four times a day (QID) | ORAL | Status: DC | PRN

## 2014-02-28 ENCOUNTER — Telehealth: Payer: Self-pay | Admitting: *Deleted

## 2014-02-28 NOTE — Telephone Encounter (Signed)
Left detailed message on cell# and to call and arrange ER follow up.

## 2014-02-28 NOTE — Telephone Encounter (Signed)
Received call from pt. He states he has been breaking cialis 20mg  tablets in half. States he doesn't think he needs this high of a  dose and would like to get rx for 5mg  tablets for 1 month supply. Pt states he is currently paying out of pocket for medication.  Please advise.

## 2014-02-28 NOTE — Telephone Encounter (Signed)
Patient recently seen in ER for chest pain.  Needs ER follow-up.  Also want to verify chest pain is resolving before we continue Cialis.  Patient also due for follow-up for medication refill.

## 2014-05-18 ENCOUNTER — Ambulatory Visit (HOSPITAL_COMMUNITY)
Admission: RE | Admit: 2014-05-18 | Discharge: 2014-05-18 | Disposition: A | Discharge status: Home / Self Care | Payer: BC Managed Care – PPO | Source: Ambulatory Visit | Attending: Urology | Admitting: Urology

## 2014-05-18 ENCOUNTER — Other Ambulatory Visit (HOSPITAL_COMMUNITY): Payer: Self-pay | Admitting: Urology

## 2014-05-18 ENCOUNTER — Other Ambulatory Visit: Payer: Self-pay | Admitting: Urology

## 2014-05-18 DIAGNOSIS — Z905 Acquired absence of kidney: Secondary | ICD-10-CM | POA: Insufficient documentation

## 2014-05-18 DIAGNOSIS — R109 Unspecified abdominal pain: Secondary | ICD-10-CM | POA: Insufficient documentation

## 2014-05-18 DIAGNOSIS — C649 Malignant neoplasm of unspecified kidney, except renal pelvis: Secondary | ICD-10-CM

## 2014-05-18 DIAGNOSIS — M899 Disorder of bone, unspecified: Secondary | ICD-10-CM | POA: Insufficient documentation

## 2014-05-18 DIAGNOSIS — M949 Disorder of cartilage, unspecified: Secondary | ICD-10-CM

## 2014-05-18 DIAGNOSIS — K5730 Diverticulosis of large intestine without perforation or abscess without bleeding: Secondary | ICD-10-CM | POA: Insufficient documentation

## 2014-05-18 LAB — POCT I-STAT, CHEM 8
BUN: 13 mg/dL (ref 6–23)
Calcium, Ion: 1.27 mmol/L — ABNORMAL HIGH (ref 1.12–1.23)
Chloride: 102 mEq/L (ref 96–112)
Creatinine, Ser: 1.1 mg/dL (ref 0.50–1.35)
Glucose, Bld: 91 mg/dL (ref 70–99)
HCT: 47 % (ref 39.0–52.0)
HEMOGLOBIN: 16 g/dL (ref 13.0–17.0)
Potassium: 4.5 mEq/L (ref 3.7–5.3)
SODIUM: 139 meq/L (ref 137–147)
TCO2: 26 mmol/L (ref 0–100)

## 2014-05-18 MED ORDER — IOHEXOL 300 MG/ML  SOLN
100.0000 mL | Freq: Once | INTRAMUSCULAR | Status: AC | PRN
  Administered 2014-05-18: 100 mL via INTRAVENOUS

## 2014-06-20 ENCOUNTER — Other Ambulatory Visit: Payer: Self-pay | Admitting: Physician Assistant

## 2014-11-02 ENCOUNTER — Encounter (HOSPITAL_BASED_OUTPATIENT_CLINIC_OR_DEPARTMENT_OTHER): Payer: Self-pay

## 2014-11-02 ENCOUNTER — Emergency Department (HOSPITAL_BASED_OUTPATIENT_CLINIC_OR_DEPARTMENT_OTHER): Payer: Self-pay

## 2014-11-02 ENCOUNTER — Emergency Department (HOSPITAL_BASED_OUTPATIENT_CLINIC_OR_DEPARTMENT_OTHER)
Admission: EM | Admit: 2014-11-02 | Discharge: 2014-11-02 | Disposition: A | Discharge status: Home / Self Care | Payer: Self-pay | Attending: Emergency Medicine | Admitting: Emergency Medicine

## 2014-11-02 DIAGNOSIS — Z85528 Personal history of other malignant neoplasm of kidney: Secondary | ICD-10-CM | POA: Insufficient documentation

## 2014-11-02 DIAGNOSIS — Y92410 Unspecified street and highway as the place of occurrence of the external cause: Secondary | ICD-10-CM | POA: Insufficient documentation

## 2014-11-02 DIAGNOSIS — Z8659 Personal history of other mental and behavioral disorders: Secondary | ICD-10-CM | POA: Insufficient documentation

## 2014-11-02 DIAGNOSIS — Y9339 Activity, other involving climbing, rappelling and jumping off: Secondary | ICD-10-CM | POA: Insufficient documentation

## 2014-11-02 DIAGNOSIS — Y998 Other external cause status: Secondary | ICD-10-CM | POA: Insufficient documentation

## 2014-11-02 DIAGNOSIS — Z9852 Vasectomy status: Secondary | ICD-10-CM | POA: Insufficient documentation

## 2014-11-02 DIAGNOSIS — Z8619 Personal history of other infectious and parasitic diseases: Secondary | ICD-10-CM | POA: Insufficient documentation

## 2014-11-02 DIAGNOSIS — Z79899 Other long term (current) drug therapy: Secondary | ICD-10-CM | POA: Insufficient documentation

## 2014-11-02 DIAGNOSIS — Z872 Personal history of diseases of the skin and subcutaneous tissue: Secondary | ICD-10-CM | POA: Insufficient documentation

## 2014-11-02 DIAGNOSIS — T1490XA Injury, unspecified, initial encounter: Secondary | ICD-10-CM

## 2014-11-02 DIAGNOSIS — S8392XA Sprain of unspecified site of left knee, initial encounter: Secondary | ICD-10-CM | POA: Insufficient documentation

## 2014-11-02 DIAGNOSIS — Z720 Tobacco use: Secondary | ICD-10-CM | POA: Insufficient documentation

## 2014-11-02 MED ORDER — OXYCODONE-ACETAMINOPHEN 5-325 MG PO TABS
1.0000 | ORAL_TABLET | Freq: Once | ORAL | Status: AC
  Administered 2014-11-02: 1 via ORAL
  Filled 2014-11-02: qty 1

## 2014-11-02 MED ORDER — HYDROCODONE-ACETAMINOPHEN 5-325 MG PO TABS: 1.0000 | ORAL_TABLET | ORAL | Status: DC | PRN

## 2014-11-02 MED ORDER — IBUPROFEN 800 MG PO TABS: 800.0000 mg | ORAL_TABLET | Freq: Three times a day (TID) | ORAL | Status: DC

## 2014-11-02 NOTE — Discharge Instructions (Signed)
Ibuprofen for pain. norco for severe pain. Keep knee elevated at home. Ice. Crutches as needed. Immobilizer at all times. Follow up with orthopedics specialist.   Knee Sprain A knee sprain is a tear in one of the strong, fibrous tissues that connect the bones (ligaments) in your knee. The severity of the sprain depends on how much of the ligament is torn. The tear can be either partial or complete. CAUSES  Often, sprains are a result of a fall or injury. The force of the impact causes the fibers of your ligament to stretch too much. This excess tension causes the fibers of your ligament to tear. SIGNS AND SYMPTOMS  You may have some loss of motion in your knee. Other symptoms include:  Bruising.  Pain in the knee area.  Tenderness of the knee to the touch.  Swelling. DIAGNOSIS  To diagnose a knee sprain, your health care provider will physically examine your knee. Your health care provider may also suggest an X-ray exam of your knee to make sure no bones are broken. TREATMENT  If your ligament is only partially torn, treatment usually involves keeping the knee in a fixed position (immobilization) or bracing your knee for activities that require movement for several weeks. To do this, your health care provider will apply a bandage, cast, or splint to keep your knee from moving and to support your knee during movement until it heals. For a partially torn ligament, the healing process usually takes 4-6 weeks. If your ligament is completely torn, depending on which ligament it is, you may need surgery to reconnect the ligament to the bone or reconstruct it. After surgery, a cast or splint may be applied and will need to stay on your knee for 4-6 weeks while your ligament heals. HOME CARE INSTRUCTIONS  Keep your injured knee elevated to decrease swelling.  To ease pain and swelling, apply ice to the injured area:  Put ice in a plastic bag.  Place a towel between your skin and the  bag.  Leave the ice on for 20 minutes, 2-3 times a day.  Only take medicine for pain as directed by your health care provider.  Do not leave your knee unprotected until pain and stiffness go away (usually 4-6 weeks).  If you have a cast or splint, do not allow it to get wet. If you have been instructed not to remove it, cover it with a plastic bag when you shower or bathe. Do not swim.  Your health care provider may suggest exercises for you to do during your recovery to prevent or limit permanent weakness and stiffness. SEEK IMMEDIATE MEDICAL CARE IF:  Your cast or splint becomes damaged.  Your pain becomes worse.  You have significant pain, swelling, or numbness below the cast or splint. MAKE SURE YOU:  Understand these instructions.  Will watch your condition.  Will get help right away if you are not doing well or get worse. Document Released: 10/07/2005 Document Revised: 07/28/2013 Document Reviewed: 05/19/2013 [facility] Patient Information 2015 [place], [place]. This information is not intended to replace advice given to you by your health care provider. Make sure you discuss any questions you have with your health care provider.

## 2014-11-02 NOTE — ED Provider Notes (Signed)
CSN: [id number]     Arrival date & time 11/02/14  1934 History   First MD Initiated Contact with Patient 11/02/14 2037     Chief Complaint  Patient presents with  . Knee Injury     (Consider location/radiation/quality/duration/timing/severity/associated sxs/prior Treatment) HPI [name] is a 39 y.o. male with no active medical problems presents, presents to emergency department complaining of left knee injury. Pt states he jumped off the truck and felt his left knee "buckle" to the inside. Reports pain since then. States pain and swelling worsening over the day. Unable to bear weight. Denies numbness or weakness below the knee. States having difficulty fully flexing and fully extending the knee. No other injuries.  Denies prior knee injuries.   Past Medical History  Diagnosis Date  . Cellulitis     left leg and stomach  . Hx of vasectomy   . Chicken pox   . Anxiety and depression 08/01/2013  . Diarrhea 08/01/2013  . Renal cell carcinoma 06/01/2013   Past Surgical History  Procedure Laterality Date  . Wrist surgery Left middle school    "arteries and nerves tangled up"  . Vasectomy  2012  . Wisdom tooth extraction  middle school  . Robotic assited partial nephrectomy Left 06/17/2013    Procedure: ROBOTIC ASSITED PARTIAL NEPHRECTOMY;  Surgeon: Dutch Gray, MD;  Location: [facility];  Service: Urology;  Laterality: Left;  . Esophagogastroduodenoscopy (egd) with propofol N/A 08/19/2013    Procedure: ESOPHAGOGASTRODUODENOSCOPY (EGD) WITH PROPOFOL;  Surgeon: Milus Banister, MD;  Location: [facility];  Service: Endoscopy;  Laterality: N/A;  . Colonoscopy with propofol N/A 08/19/2013    Procedure: COLONOSCOPY WITH PROPOFOL;  Surgeon: Milus Banister, MD;  Location: [facility];  Service: Endoscopy;  Laterality: N/A;   Family History  Problem Relation Age of Onset  . Cirrhosis Father   . Colitis Father   . Heart disease Father   . Asthma Father   . Heart attack Other     Paternal  Grandparents  . Stroke Other     Paternal Grandparents  . Prostate cancer Paternal Grandfather   . Diabetes Maternal Grandfather   . Alcohol abuse Mother   . Other Brother     Intestinal Fissure  . Colon polyps Sister     intestinal problems  . Asthma Son   . Other Father     Chemical Imbalance  . Other Brother     Chemical Imbalance   History  Substance Use Topics  . Smoking status: Current Every Day Smoker -- 1.00 packs/day for 24 years    Types: Cigarettes  . Smokeless tobacco: Current User    Types: Chew  . Alcohol Use: Yes     Comment: occasional/social    Review of Systems  Constitutional: Negative for fever and chills.  Musculoskeletal: Positive for joint swelling and arthralgias.  Skin: Negative for rash.  Allergic/Immunologic: Negative for immunocompromised state.  Neurological: Negative for weakness and numbness.      Allergies  Bee venom and Hornet venom  Home Medications   Prior to Admission medications   Medication Sig Start Date End Date Taking? Authorizing Provider  EPINEPHrine (EPI-PEN) 0.3 mg/0.3 mL SOAJ injection Inject 0.3 mLs (0.3 mg total) into the muscle once. 06/11/13   Brunetta Jeans, PA-C  fish oil-omega-3 fatty acids 1000 MG capsule Take 1 g by mouth daily.    Historical Provider, MD  ibuprofen (ADVIL,MOTRIN) 200 MG tablet Take 800 mg by mouth every 6 (six) hours as  needed for pain.     Historical Provider, MD  tadalafil (CIALIS) 20 MG tablet Take 0.5-1 tablets (10-20 mg total) by mouth every other day as needed for erectile dysfunction. 06/11/13   Brunetta Jeans, PA-C  tetrahydrozoline 0.05 % ophthalmic solution Place 1 drop into both eyes 2 (two) times daily as needed (dry eyes).    Historical Provider, MD   BP 167/95 mmHg  Pulse 90  Temp(Src) 98.7 F (37.1 C) (Oral)  Resp 20  Ht 6\' 1"  (1.854 m)  Wt 245 lb (111.131 kg)  BMI 32.33 kg/m2  SpO2 100% Physical Exam  Constitutional: He appears well-developed and well-nourished. No  distress.  HENT:  Head: Normocephalic and atraumatic.  Eyes: Conjunctivae are normal.  Neck: Neck supple.  Musculoskeletal:  Swelling noted to the left knee joint. Tenderness to palpation of the medial joint, tenderness over the suprapatellar area. Pain with knee flexion greater than 30. Patient unable to extend completely the knee. Patient is able to lift straight leg off the stretcher. Pain with anterior drawer sign, however no laxity. Negative posterior drawer sign. Pain with valgus maneuver. No laxity. Normal ankle and foot. DP pulses are intact and equal bilaterally.  Neurological: He is alert.  Skin: Skin is warm and dry.  Nursing note and vitals reviewed.   ED Course  Procedures (including critical care time) Labs Review Labs Reviewed - No data to display  Imaging Review Dg Knee Complete 4 Views Left  11/02/2014   CLINICAL DATA:  LEFT knee pain after jumping from for feet, LEFT knee popped and buckled, patella feels like it shifted laterally, injury  EXAM: LEFT KNEE - COMPLETE 4+ VIEW  COMPARISON:  None  FINDINGS: Osseous mineralization normal.  Joint spaces preserved.  No acute fracture, dislocation or bone destruction.  Moderate size knee joint effusion present.  Soft tissues otherwise unremarkable.  IMPRESSION: Knee joint effusion without acute bony abnormalities.   Electronically Signed   By: Lavonia Dana M.D.   On: 11/02/2014 20:21     EKG Interpretation None      MDM   Final diagnoses:  Knee sprain, left, initial encounter    patient is here with left knee injury. Exam is difficult due to patient's pain. X-ray showing joint effusion, otherwise unremarkable. I have discussed with them the possibility of ligamentous injury as well as meniscal injury. Occult fracture discussed as well. At this time we'll place in knee immobilizer, crutches. Will discharge home with pain medications. He is to follow-up with orthopedic specialist. Will give referral.  Filed Vitals:    11/02/14 1940  BP: 167/95  Pulse: 90  Temp: 98.7 F (37.1 C)  TempSrc: Oral  Resp: 20  Height: 6\' 1"  (1.854 m)  Weight: 245 lb (111.131 kg)  SpO2: 100%     Renold Genta, PA-C 11/02/14 [place], MD 11/02/14 2352

## 2014-11-02 NOTE — ED Notes (Signed)
Jumped off truck this an inj left knee  Slight swelling

## 2014-11-02 NOTE — ED Notes (Signed)
Left knee injury while jumping off truck this am-pt has knee sleeve in place

## 2014-11-08 ENCOUNTER — Other Ambulatory Visit (HOSPITAL_COMMUNITY): Payer: Self-pay | Admitting: Orthopedic Surgery

## 2014-11-08 DIAGNOSIS — M25562 Pain in left knee: Secondary | ICD-10-CM

## 2014-11-08 DIAGNOSIS — M25561 Pain in right knee: Secondary | ICD-10-CM

## 2014-11-24 ENCOUNTER — Ambulatory Visit (HOSPITAL_COMMUNITY): Admission: RE | Admit: 2014-11-24 | Payer: Self-pay | Source: Ambulatory Visit

## 2015-01-25 ENCOUNTER — Ambulatory Visit: Payer: Self-pay | Admitting: Physician Assistant

## 2015-01-25 DIAGNOSIS — Z0289 Encounter for other administrative examinations: Secondary | ICD-10-CM

## 2015-01-30 ENCOUNTER — Telehealth: Payer: Self-pay | Admitting: Physician Assistant

## 2015-01-30 ENCOUNTER — Encounter: Payer: Self-pay | Admitting: Physician Assistant

## 2015-01-30 NOTE — Telephone Encounter (Signed)
Pt was no show for appt on 01/25/15- letter sent- charge?

## 2015-01-30 NOTE — Telephone Encounter (Signed)
Charge. 

## 2015-02-01 ENCOUNTER — Ambulatory Visit: Payer: Self-pay | Admitting: Physician Assistant

## 2015-02-03 ENCOUNTER — Ambulatory Visit: Payer: Self-pay | Admitting: Physician Assistant

## 2015-02-08 ENCOUNTER — Ambulatory Visit (INDEPENDENT_AMBULATORY_CARE_PROVIDER_SITE_OTHER): Payer: Self-pay | Admitting: Physician Assistant

## 2015-02-08 ENCOUNTER — Encounter: Payer: Self-pay | Admitting: Physician Assistant

## 2015-02-08 VITALS — BP 138/88 | HR 85 | Temp 98.4°F | Resp 16 | Ht 73.0 in | Wt 255.5 lb

## 2015-02-08 DIAGNOSIS — N529 Male erectile dysfunction, unspecified: Secondary | ICD-10-CM

## 2015-02-08 MED ORDER — SILDENAFIL CITRATE 20 MG PO TABS: ORAL_TABLET | ORAL | Status: DC

## 2015-02-08 NOTE — Progress Notes (Signed)
Pre visit review using our clinic review tool, if applicable. No additional management support is needed unless otherwise documented below in the visit note/SLS  

## 2015-02-08 NOTE — Progress Notes (Signed)
Patient presents to clinic today for medication management. Patient currently on Cialis 10 mg daily for ED with good result.  Has recently lost his insurance and can no longer afford Rx.  Is wanting to discuss alternatives.  Past Medical History  Diagnosis Date  . Cellulitis     left leg and stomach  . Hx of vasectomy   . Chicken pox   . Anxiety and depression 08/01/2013  . Diarrhea 08/01/2013  . Renal cell carcinoma 06/01/2013    Current Outpatient Prescriptions on File Prior to Visit  Medication Sig Dispense Refill  . fish oil-omega-3 fatty acids 1000 MG capsule Take 1 g by mouth daily.    Marland Kitchen ibuprofen (ADVIL,MOTRIN) 800 MG tablet Take 1 tablet (800 mg total) by mouth 3 (three) times daily. 21 tablet 0  . tetrahydrozoline 0.05 % ophthalmic solution Place 1 drop into both eyes 2 (two) times daily as needed (dry eyes).    Marland Kitchen EPINEPHrine (EPI-PEN) 0.3 mg/0.3 mL SOAJ injection Inject 0.3 mLs (0.3 mg total) into the muscle once. (Patient not taking: Reported on 02/08/2015) 1 Device 1   No current facility-administered medications on file prior to visit.    Allergies  Allergen Reactions  . Bee Venom Shortness Of Breath and Swelling    Arm swells  . Hornet Venom Shortness Of Breath and Swelling    Arm swells    Family History  Problem Relation Age of Onset  . Cirrhosis Father   . Colitis Father   . Heart disease Father   . Asthma Father   . Heart attack Other     Paternal Grandparents  . Stroke Other     Paternal Grandparents  . Prostate cancer Paternal Grandfather   . Diabetes Maternal Grandfather   . Alcohol abuse Mother   . Other Brother     Intestinal Fissure  . Colon polyps Sister     intestinal problems  . Asthma Son   . Other Father     Chemical Imbalance  . Other Brother     Chemical Imbalance    History   Social History  . Marital Status: Divorced    Spouse Name: N/A  . Number of Children: 3  . Years of Education: N/A   Occupational History  .      Social History Main Topics  . Smoking status: Current Every Day Smoker -- 1.00 packs/day for 24 years    Types: Cigarettes  . Smokeless tobacco: Current User    Types: Chew  . Alcohol Use: Yes     Comment: occasional/social  . Drug Use: No  . Sexual Activity: Not on file   Other Topics Concern  . None   Social History Narrative   Review of Systems - See HPI.  All other ROS are negative.  BP 138/88 mmHg  Pulse 85  Temp(Src) 98.4 F (36.9 C) (Oral)  Resp 16  Ht 6\' 1"  (1.854 m)  Wt 255 lb 8 oz (115.894 kg)  BMI 33.72 kg/m2  SpO2 99%  Physical Exam  Constitutional: He is well-developed, well-nourished, and in no distress.  HENT:  Head: Normocephalic and atraumatic.  Eyes: Conjunctivae are normal.  Cardiovascular: Normal rate.   Pulmonary/Chest: Effort normal.  Skin: Skin is warm and dry.  Psychiatric: Affect normal.  Vitals reviewed.   No results found for this or any previous visit (from the past 2160 hour(s)).  Assessment/Plan: Erectile dysfunction Will begin generic sildenafil 40 mg as directed for ED.  Follow-up for CPE  as patient is overdue.

## 2015-02-08 NOTE — Assessment & Plan Note (Signed)
Will begin generic sildenafil 40 mg as directed for ED.  Follow-up for CPE as patient is overdue.

## 2015-02-08 NOTE — Patient Instructions (Signed)
Please use voucher given for generic viagra. Take as directed.  Please call your Nephrologist to schedule a follow-up appointment.

## 2015-02-14 ENCOUNTER — Telehealth: Payer: Self-pay | Admitting: Physician Assistant

## 2015-02-14 NOTE — Telephone Encounter (Signed)
previsit letter for annual exam mailed  °

## 2015-03-06 ENCOUNTER — Telehealth: Payer: Self-pay | Admitting: *Deleted

## 2015-03-06 ENCOUNTER — Encounter: Payer: Self-pay | Admitting: *Deleted

## 2015-03-06 NOTE — Telephone Encounter (Signed)
Pre-Visit Call completed with patient and chart updated.   Pre-Visit Info documented in Specialty Comments under [employer].    

## 2015-03-07 ENCOUNTER — Other Ambulatory Visit: Payer: Self-pay | Admitting: Physician Assistant

## 2015-03-07 ENCOUNTER — Encounter: Payer: Self-pay | Admitting: Physician Assistant

## 2015-03-07 ENCOUNTER — Ambulatory Visit (INDEPENDENT_AMBULATORY_CARE_PROVIDER_SITE_OTHER): Payer: Self-pay | Admitting: Physician Assistant

## 2015-03-07 ENCOUNTER — Other Ambulatory Visit (HOSPITAL_COMMUNITY)
Admission: RE | Admit: 2015-03-07 | Discharge: 2015-03-07 | Disposition: A | Discharge status: Home / Self Care | Payer: Medicaid Other | Source: Ambulatory Visit | Attending: Physician Assistant | Admitting: Physician Assistant

## 2015-03-07 VITALS — BP 144/91 | HR 85 | Temp 98.4°F | Ht 72.0 in | Wt 251.6 lb

## 2015-03-07 DIAGNOSIS — Z113 Encounter for screening for infections with a predominantly sexual mode of transmission: Secondary | ICD-10-CM | POA: Insufficient documentation

## 2015-03-07 DIAGNOSIS — E669 Obesity, unspecified: Secondary | ICD-10-CM | POA: Insufficient documentation

## 2015-03-07 DIAGNOSIS — Z202 Contact with and (suspected) exposure to infections with a predominantly sexual mode of transmission: Secondary | ICD-10-CM | POA: Insufficient documentation

## 2015-03-07 DIAGNOSIS — Z720 Tobacco use: Secondary | ICD-10-CM

## 2015-03-07 DIAGNOSIS — Z0000 Encounter for general adult medical examination without abnormal findings: Secondary | ICD-10-CM | POA: Insufficient documentation

## 2015-03-07 DIAGNOSIS — R5383 Other fatigue: Secondary | ICD-10-CM | POA: Insufficient documentation

## 2015-03-07 DIAGNOSIS — Z23 Encounter for immunization: Secondary | ICD-10-CM

## 2015-03-07 DIAGNOSIS — R5382 Chronic fatigue, unspecified: Secondary | ICD-10-CM

## 2015-03-07 LAB — COMPREHENSIVE METABOLIC PANEL
ALBUMIN: 4.5 g/dL (ref 3.5–5.2)
ALT: 25 U/L (ref 0–53)
AST: 21 U/L (ref 0–37)
Alkaline Phosphatase: 60 U/L (ref 39–117)
BUN: 11 mg/dL (ref 6–23)
CALCIUM: 9.6 mg/dL (ref 8.4–10.5)
CO2: 26 mEq/L (ref 19–32)
Chloride: 105 mEq/L (ref 96–112)
Creatinine, Ser: 1.06 mg/dL (ref 0.40–1.50)
GFR: 82.2 mL/min (ref >60.00)
Glucose, Bld: 105 mg/dL — ABNORMAL HIGH (ref 70–99)
POTASSIUM: 4.1 meq/L (ref 3.5–5.1)
Sodium: 136 mEq/L (ref 135–145)
Total Bilirubin: 0.8 mg/dL (ref 0.2–1.2)
Total Protein: 7.2 g/dL (ref 6.0–8.3)

## 2015-03-07 LAB — CBC WITH DIFFERENTIAL/PLATELET
BASOS ABS: 0 10*3/uL (ref 0.0–0.1)
BASOS PCT: 0.6 % (ref 0.0–3.0)
Eosinophils Absolute: 0.3 10*3/uL (ref 0.0–0.7)
Eosinophils Relative: 4.7 % (ref 0.0–5.0)
HCT: 47.7 % (ref 39.0–52.0)
Hemoglobin: 16.6 g/dL (ref 13.0–17.0)
Lymphocytes Relative: 18.6 % (ref 12.0–46.0)
Lymphs Abs: 1.3 10*3/uL (ref 0.7–4.0)
MCHC: 34.7 g/dL (ref 30.0–36.0)
MCV: 94.3 fl (ref 78.0–100.0)
MONOS PCT: 7.6 % (ref 3.0–12.0)
Monocytes Absolute: 0.5 10*3/uL (ref 0.1–1.0)
NEUTROS ABS: 4.7 10*3/uL (ref 1.4–7.7)
NEUTROS PCT: 68.5 % (ref 43.0–77.0)
Platelets: 214 10*3/uL (ref 150.0–400.0)
RBC: 5.06 Mil/uL (ref 4.22–5.81)
RDW: 12.7 % (ref 11.5–15.5)
WBC: 6.8 10*3/uL (ref 4.0–10.5)

## 2015-03-07 LAB — URINALYSIS, ROUTINE W REFLEX MICROSCOPIC
BILIRUBIN URINE: NEGATIVE
Hgb urine dipstick: NEGATIVE
Ketones, ur: NEGATIVE
LEUKOCYTES UA: NEGATIVE
NITRITE: NEGATIVE
RBC / HPF: NONE SEEN (ref 0–?)
Specific Gravity, Urine: 1.015 (ref 1.000–1.030)
TOTAL PROTEIN, URINE-UPE24: NEGATIVE
Urine Glucose: NEGATIVE
Urobilinogen, UA: 0.2 (ref 0.0–1.0)
WBC, UA: NONE SEEN (ref 0–?)
pH: 7.5 (ref 5.0–8.0)

## 2015-03-07 LAB — TSH: TSH: 1.15 u[IU]/mL (ref 0.35–4.50)

## 2015-03-07 LAB — LIPID PANEL
CHOLESTEROL: 170 mg/dL (ref 0–200)
HDL: 40.9 mg/dL (ref >39.00)
LDL Cholesterol: 105 mg/dL — ABNORMAL HIGH (ref 0–99)
NonHDL: 129.1
Total CHOL/HDL Ratio: 4
Triglycerides: 121 mg/dL (ref 0.0–149.0)
VLDL: 24.2 mg/dL (ref 0.0–40.0)

## 2015-03-07 LAB — ACUTE HEP PANEL AND HEP B SURFACE AB
HCV Ab: NEGATIVE
HEP B S AB: NEGATIVE
Hep A IgM: NONREACTIVE
Hep B C IgM: NONREACTIVE
Hepatitis B Surface Ag: NEGATIVE

## 2015-03-07 LAB — HEMOGLOBIN A1C: HEMOGLOBIN A1C: 5.4 % (ref 4.6–6.5)

## 2015-03-07 LAB — TESTOSTERONE: TESTOSTERONE: 402.42 ng/dL (ref 300.00–890.00)

## 2015-03-07 MED ORDER — PHENTERMINE HCL 30 MG PO CAPS: 30.0000 mg | ORAL_CAPSULE | ORAL | Status: DC

## 2015-03-07 NOTE — Progress Notes (Signed)
Pre visit review using our clinic review tool, if applicable. No additional management support is needed unless otherwise documented below in the visit note. 

## 2015-03-07 NOTE — Patient Instructions (Signed)
Please go to the lab for blood work. I will call you with your results. Please take the Phentermine daily as directed.  Let me know if you notice any difficulty sleeping, racing heart or nausea.  Follow-up in 1 month.  Preventive Care for Adults A healthy lifestyle and preventive care can promote health and wellness. Preventive health guidelines for men include the following key practices:  A routine yearly physical is a good way to check with your health care provider about your health and preventative screening. It is a chance to share any concerns and updates on your health and to receive a thorough exam.  Visit your dentist for a routine exam and preventative care every 6 months. Brush your teeth twice a day and floss once a day. Good oral hygiene prevents tooth decay and gum disease.  The frequency of eye exams is based on your age, health, family medical history, use of contact lenses, and other factors. Follow your health care provider's recommendations for frequency of eye exams.  Eat a healthy diet. Foods such as vegetables, fruits, whole grains, low-fat dairy products, and lean protein foods contain the nutrients you need without too many calories. Decrease your intake of foods high in solid fats, added sugars, and salt. Eat the right amount of calories for you.Get information about a proper diet from your health care provider, if necessary.  Regular physical exercise is one of the most important things you can do for your health. Most adults should get at least 150 minutes of moderate-intensity exercise (any activity that increases your heart rate and causes you to sweat) each week. In addition, most adults need muscle-strengthening exercises on 2 or more days a week.  Maintain a healthy weight. The body mass index (BMI) is a screening tool to identify possible weight problems. It provides an estimate of body fat based on height and weight. Your health care provider can find your BMI  and can help you achieve or maintain a healthy weight.For adults 20 years and older:  A BMI below 18.5 is considered underweight.  A BMI of 18.5 to 24.9 is normal.  A BMI of 25 to 29.9 is considered overweight.  A BMI of 30 and above is considered obese.  Maintain normal blood lipids and cholesterol levels by exercising and minimizing your intake of saturated fat. Eat a balanced diet with plenty of fruit and vegetables. Blood tests for lipids and cholesterol should begin at age 40 and be repeated every 5 years. If your lipid or cholesterol levels are high, you are over 50, or you are at high risk for heart disease, you may need your cholesterol levels checked more frequently.Ongoing high lipid and cholesterol levels should be treated with medicines if diet and exercise are not working.  If you smoke, find out from your health care provider how to quit. If you do not use tobacco, do not start.  Lung cancer screening is recommended for adults aged 56-80 years who are at high risk for developing lung cancer because of a history of smoking. A yearly low-dose CT scan of the lungs is recommended for people who have at least a 30-pack-year history of smoking and are a current smoker or have quit within the past 15 years. A pack year of smoking is smoking an average of 1 pack of cigarettes a day for 1 year (for example: 1 pack a day for 30 years or 2 packs a day for 15 years). Yearly screening should continue until the  smoker has stopped smoking for at least 15 years. Yearly screening should be stopped for people who develop a health problem that would prevent them from having lung cancer treatment.  If you choose to drink alcohol, do not have more than 2 drinks per day. One drink is considered to be 12 ounces (355 mL) of beer, 5 ounces (148 mL) of wine, or 1.5 ounces (44 mL) of liquor.  Avoid use of street drugs. Do not share needles with anyone. Ask for help if you need support or instructions about  stopping the use of drugs.  High blood pressure causes heart disease and increases the risk of stroke. Your blood pressure should be checked at least every 1-2 years. Ongoing high blood pressure should be treated with medicines, if weight loss and exercise are not effective.  If you are 20-96 years old, ask your health care provider if you should take aspirin to prevent heart disease.  Diabetes screening involves taking a blood sample to check your fasting blood sugar level. This should be done once every 3 years, after age 27, if you are within normal weight and without risk factors for diabetes. Testing should be considered at a younger age or be carried out more frequently if you are overweight and have at least 1 risk factor for diabetes.  Colorectal cancer can be detected and often prevented. Most routine colorectal cancer screening begins at the age of 65 and continues through age 71. However, your health care provider may recommend screening at an earlier age if you have risk factors for colon cancer. On a yearly basis, your health care provider may provide home test kits to check for hidden blood in the stool. Use of a small camera at the end of a tube to directly examine the colon (sigmoidoscopy or colonoscopy) can detect the earliest forms of colorectal cancer. Talk to your health care provider about this at age 40, when routine screening begins. Direct exam of the colon should be repeated every 5-10 years through age 40, unless early forms of precancerous polyps or small growths are found.  People who are at an increased risk for hepatitis B should be screened for this virus. You are considered at high risk for hepatitis B if:  You were born in a country where hepatitis B occurs often. Talk with your health care provider about which countries are considered high risk.  Your parents were born in a high-risk country and you have not received a shot to protect against hepatitis B (hepatitis B  vaccine).  You have HIV or AIDS.  You use needles to inject street drugs.  You live with, or have sex with, someone who has hepatitis B.  You are a man who has sex with other men (MSM).  You get hemodialysis treatment.  You take certain medicines for conditions such as cancer, organ transplantation, and autoimmune conditions.  Hepatitis C blood testing is recommended for all people born from [birth year] through 1965 and any individual with known risks for hepatitis C.  Practice safe sex. Use condoms and avoid high-risk sexual practices to reduce the spread of sexually transmitted infections (STIs). STIs include gonorrhea, chlamydia, syphilis, trichomonas, herpes, HPV, and human immunodeficiency virus (HIV). Herpes, HIV, and HPV are viral illnesses that have no cure. They can result in disability, cancer, and death.  If you are at risk of being infected with HIV, it is recommended that you take a prescription medicine daily to prevent HIV infection. This is called preexposure  prophylaxis (PrEP). You are considered at risk if:  You are a man who has sex with other men (MSM) and have other risk factors.  You are a heterosexual man, are sexually active, and are at increased risk for HIV infection.  You take drugs by injection.  You are sexually active with a partner who has HIV.  Talk with your health care provider about whether you are at high risk of being infected with HIV. If you choose to begin PrEP, you should first be tested for HIV. You should then be tested every 3 months for as long as you are taking PrEP.  A one-time screening for abdominal aortic aneurysm (AAA) and surgical repair of large AAAs by ultrasound are recommended for men ages 39 to 56 years who are current or former smokers.  Healthy men should no longer receive prostate-specific antigen (PSA) blood tests as part of routine cancer screening. Talk with your health care provider about prostate cancer screening.  Testicular  cancer screening is not recommended for adult males who have no symptoms. Screening includes self-exam, a health care provider exam, and other screening tests. Consult with your health care provider about any symptoms you have or any concerns you have about testicular cancer.  Use sunscreen. Apply sunscreen liberally and repeatedly throughout the day. You should seek shade when your shadow is shorter than you. Protect yourself by wearing long sleeves, pants, a wide-brimmed hat, and sunglasses year round, whenever you are outdoors.  Once a month, do a whole-body skin exam, using a mirror to look at the skin on your back. Tell your health care provider about new moles, moles that have irregular borders, moles that are larger than a pencil eraser, or moles that have changed in shape or color.  Stay current with required vaccines (immunizations).  Influenza vaccine. All adults should be immunized every year.  Tetanus, diphtheria, and acellular pertussis (Td, Tdap) vaccine. An adult who has not previously received Tdap or who does not know his vaccine status should receive 1 dose of Tdap. This initial dose should be followed by tetanus and diphtheria toxoids (Td) booster doses every 10 years. Adults with an unknown or incomplete history of completing a 3-dose immunization series with Td-containing vaccines should begin or complete a primary immunization series including a Tdap dose. Adults should receive a Td booster every 10 years.  Varicella vaccine. An adult without evidence of immunity to varicella should receive 2 doses or a second dose if he has previously received 1 dose.  Human papillomavirus (HPV) vaccine. Males aged 59-21 years who have not received the vaccine previously should receive the 3-dose series. Males aged 22-26 years may be immunized. Immunization is recommended through the age of 48 years for any male who has sex with males and did not get any or all doses earlier. Immunization is  recommended for any person with an immunocompromised condition through the age of 60 years if he did not get any or all doses earlier. During the 3-dose series, the second dose should be obtained 4-8 weeks after the first dose. The third dose should be obtained 24 weeks after the first dose and 16 weeks after the second dose.  Zoster vaccine. One dose is recommended for adults aged 58 years or older unless certain conditions are present.  Measles, mumps, and rubella (MMR) vaccine. Adults born before [birth year] generally are considered immune to measles and mumps. Adults born in [birth year] or later should have 1 or more doses of MMR vaccine  unless there is a contraindication to the vaccine or there is laboratory evidence of immunity to each of the three diseases. A routine second dose of MMR vaccine should be obtained at least 28 days after the first dose for students attending postsecondary schools, health care workers, or international travelers. People who received inactivated measles vaccine or an unknown type of measles vaccine during 1963-1967 should receive 2 doses of MMR vaccine. People who received inactivated mumps vaccine or an unknown type of mumps vaccine before 1979 and are at high risk for mumps infection should consider immunization with 2 doses of MMR vaccine. Unvaccinated health care workers born before [birth year] who lack laboratory evidence of measles, mumps, or rubella immunity or laboratory confirmation of disease should consider measles and mumps immunization with 2 doses of MMR vaccine or rubella immunization with 1 dose of MMR vaccine.  Pneumococcal 13-valent conjugate (PCV13) vaccine. When indicated, a person who is uncertain of his immunization history and has no record of immunization should receive the PCV13 vaccine. An adult aged 50 years or older who has certain medical conditions and has not been previously immunized should receive 1 dose of PCV13 vaccine. This PCV13 should be followed with a dose  of pneumococcal polysaccharide (PPSV23) vaccine. The PPSV23 vaccine dose should be obtained at least 8 weeks after the dose of PCV13 vaccine. An adult aged 82 years or older who has certain medical conditions and previously received 1 or more doses of PPSV23 vaccine should receive 1 dose of PCV13. The PCV13 vaccine dose should be obtained 1 or more years after the last PPSV23 vaccine dose.  Pneumococcal polysaccharide (PPSV23) vaccine. When PCV13 is also indicated, PCV13 should be obtained first. All adults aged 79 years and older should be immunized. An adult younger than age 55 years who has certain medical conditions should be immunized. Any person who resides in a nursing home or long-term care facility should be immunized. An adult smoker should be immunized. People with an immunocompromised condition and certain other conditions should receive both PCV13 and PPSV23 vaccines. People with human immunodeficiency virus (HIV) infection should be immunized as soon as possible after diagnosis. Immunization during chemotherapy or radiation therapy should be avoided. Routine use of PPSV23 vaccine is not recommended for American Indians, [place] Natives, or people younger than 65 years unless there are medical conditions that require PPSV23 vaccine. When indicated, people who have unknown immunization and have no record of immunization should receive PPSV23 vaccine. One-time revaccination 5 years after the first dose of PPSV23 is recommended for people aged 19-64 years who have chronic kidney failure, nephrotic syndrome, asplenia, or immunocompromised conditions. People who received 1-2 doses of PPSV23 before age 37 years should receive another dose of PPSV23 vaccine at age 63 years or later if at least 5 years have passed since the previous dose. Doses of PPSV23 are not needed for people immunized with PPSV23 at or after age 54 years.  Meningococcal vaccine. Adults with asplenia or persistent complement component  deficiencies should receive 2 doses of quadrivalent meningococcal conjugate (MenACWY-D) vaccine. The doses should be obtained at least 2 months apart. Microbiologists working with certain meningococcal bacteria, [place] recruits, people at risk during an outbreak, and people who travel to or live in countries with a high rate of meningitis should be immunized. A first-year college student up through age 41 years who is living in a residence hall should receive a dose if he did not receive a dose on or after his 16th birthday.  Adults who have certain high-risk conditions should receive one or more doses of vaccine.  Hepatitis A vaccine. Adults who wish to be protected from this disease, have certain high-risk conditions, work with hepatitis A-infected animals, work in hepatitis A research labs, or travel to or work in countries with a high rate of hepatitis A should be immunized. Adults who were previously unvaccinated and who anticipate close contact with an international adoptee during the first 60 days after arrival in the [place] from a country with a high rate of hepatitis A should be immunized.  Hepatitis B vaccine. Adults should be immunized if they wish to be protected from this disease, have certain high-risk conditions, may be exposed to blood or other infectious body fluids, are household contacts or sex partners of hepatitis B positive people, are clients or workers in certain care facilities, or travel to or work in countries with a high rate of hepatitis B.  Haemophilus influenzae type b (Hib) vaccine. A previously unvaccinated person with asplenia or sickle cell disease or having a scheduled splenectomy should receive 1 dose of Hib vaccine. Regardless of previous immunization, a recipient of a hematopoietic stem cell transplant should receive a 3-dose series 6-12 months after his successful transplant. Hib vaccine is not recommended for adults with HIV infection. Preventive Service /  Frequency Ages 27 to 42  Blood pressure check.** / Every 1 to 2 years.  Lipid and cholesterol check.** / Every 5 years beginning at age 66.  Hepatitis C blood test.** / For any individual with known risks for hepatitis C.  Skin self-exam. / Monthly.  Influenza vaccine. / Every year.  Tetanus, diphtheria, and acellular pertussis (Tdap, Td) vaccine.** / Consult your health care provider. 1 dose of Td every 10 years.  Varicella vaccine.** / Consult your health care provider.  HPV vaccine. / 3 doses over 6 months, if 68 or younger.  Measles, mumps, rubella (MMR) vaccine.** / You need at least 1 dose of MMR if you were born in 1957 or later. You may also need a second dose.  Pneumococcal 13-valent conjugate (PCV13) vaccine.** / Consult your health care provider.  Pneumococcal polysaccharide (PPSV23) vaccine.** / 1 to 2 doses if you smoke cigarettes or if you have certain conditions.  Meningococcal vaccine.** / 1 dose if you are age 19 to 56 years and a Market researcher living in a residence hall, or have one of several medical conditions. You may also need additional booster doses.  Hepatitis A vaccine.** / Consult your health care provider.  Hepatitis B vaccine.** / Consult your health care provider.  Haemophilus influenzae type b (Hib) vaccine.** / Consult your health care provider. Ages 9 to 78  Blood pressure check.** / Every 1 to 2 years.  Lipid and cholesterol check.** / Every 5 years beginning at age 39.  Lung cancer screening. / Every year if you are aged 91-80 years and have a 30-pack-year history of smoking and currently smoke or have quit within the past 15 years. Yearly screening is stopped once you have quit smoking for at least 15 years or develop a health problem that would prevent you from having lung cancer treatment.  Fecal occult blood test (FOBT) of stool. / Every year beginning at age 45 and continuing until age 38. You may not have to do this test  if you get a colonoscopy every 10 years.  Flexible sigmoidoscopy** or colonoscopy.** / Every 5 years for a flexible sigmoidoscopy or every 10 years for a  colonoscopy beginning at age 53 and continuing until age 50.  Hepatitis C blood test.** / For all people born from [birth year] through 1965 and any individual with known risks for hepatitis C.  Skin self-exam. / Monthly.  Influenza vaccine. / Every year.  Tetanus, diphtheria, and acellular pertussis (Tdap/Td) vaccine.** / Consult your health care provider. 1 dose of Td every 10 years.  Varicella vaccine.** / Consult your health care provider.  Zoster vaccine.** / 1 dose for adults aged 38 years or older.  Measles, mumps, rubella (MMR) vaccine.** / You need at least 1 dose of MMR if you were born in 1957 or later. You may also need a second dose.  Pneumococcal 13-valent conjugate (PCV13) vaccine.** / Consult your health care provider.  Pneumococcal polysaccharide (PPSV23) vaccine.** / 1 to 2 doses if you smoke cigarettes or if you have certain conditions.  Meningococcal vaccine.** / Consult your health care provider.  Hepatitis A vaccine.** / Consult your health care provider.  Hepatitis B vaccine.** / Consult your health care provider.  Haemophilus influenzae type b (Hib) vaccine.** / Consult your health care provider. Ages 19 and over  Blood pressure check.** / Every 1 to 2 years.  Lipid and cholesterol check.**/ Every 5 years beginning at age 55.  Lung cancer screening. / Every year if you are aged 66-80 years and have a 30-pack-year history of smoking and currently smoke or have quit within the past 15 years. Yearly screening is stopped once you have quit smoking for at least 15 years or develop a health problem that would prevent you from having lung cancer treatment.  Fecal occult blood test (FOBT) of stool. / Every year beginning at age 51 and continuing until age 44. You may not have to do this test if you get a colonoscopy  every 10 years.  Flexible sigmoidoscopy** or colonoscopy.** / Every 5 years for a flexible sigmoidoscopy or every 10 years for a colonoscopy beginning at age 70 and continuing until age 23.  Hepatitis C blood test.** / For all people born from [birth year] through 1965 and any individual with known risks for hepatitis C.  Abdominal aortic aneurysm (AAA) screening.** / A one-time screening for ages 91 to 95 years who are current or former smokers.  Skin self-exam. / Monthly.  Influenza vaccine. / Every year.  Tetanus, diphtheria, and acellular pertussis (Tdap/Td) vaccine.** / 1 dose of Td every 10 years.  Varicella vaccine.** / Consult your health care provider.  Zoster vaccine.** / 1 dose for adults aged 62 years or older.  Pneumococcal 13-valent conjugate (PCV13) vaccine.** / Consult your health care provider.  Pneumococcal polysaccharide (PPSV23) vaccine.** / 1 dose for all adults aged 24 years and older.  Meningococcal vaccine.** / Consult your health care provider.  Hepatitis A vaccine.** / Consult your health care provider.  Hepatitis B vaccine.** / Consult your health care provider.  Haemophilus influenzae type b (Hib) vaccine.** / Consult your health care provider. **Family history and personal history of risk and conditions may change your health care provider's recommendations. Document Released: 12/03/2001 Document Revised: 10/12/2013 Document Reviewed: 03/04/2011 [facility] Patient Information 2015 [place], [place]. This information is not intended to replace advice given to you by your health care provider. Make sure you discuss any questions you have with your health care provider.

## 2015-03-07 NOTE — Assessment & Plan Note (Signed)
Is smoking less but no desire to proceed with cessation.

## 2015-03-07 NOTE — Assessment & Plan Note (Signed)
Depression screen negative.  Immunizations updated today -- TDaP. Preventive care discussed.  Handout given in AVS.  Will obtain fasting labs today to include CBC, CMP, LFTs, TSH, A1C, UA and Lipid Panel.

## 2015-03-07 NOTE — Assessment & Plan Note (Signed)
Unclear -- will check CBC, electrolytes, TSH and Testosterone level.

## 2015-03-07 NOTE — Assessment & Plan Note (Signed)
Immunization given by nursing staff. 

## 2015-03-07 NOTE — Progress Notes (Signed)
Patient presents to clinic today for annual exam.  Patient is fasting for labs.  Acute Concerns: Patient requesting STD check.  Denies symptoms of exposure to STD. Just wanting a routine screening.  Patient also with mid-day fatigue that he feels is excessive.  Denies depressed mood or anhedonia. Does note some increased irritability. Denies constipation or diarrhea. Is unsure of family history of hypothyroidism.  Chronic Issues: Tobacco -- Still smoking.  No interest in stopping at present.  Obesity -- Body mass index is 34.12 kg/(m^2).  Patient is trying to workout more but works every day.  Is watching diet.  Would like to discuss pharmacotherapy.  Health Maintenance: Dental -- overdue. Vision -- overdue. Immunizations -- TDaP will be given today as patient is due.  Past Medical History  Diagnosis Date  . Cellulitis     left leg and stomach  . Hx of vasectomy   . Chicken pox   . Anxiety and depression 08/01/2013  . Diarrhea 08/01/2013  . Renal cell carcinoma 06/01/2013    Past Surgical History  Procedure Laterality Date  . Wrist surgery Left middle school    "arteries and nerves tangled up"  . Vasectomy  2012  . Wisdom tooth extraction  middle school  . Robotic assited partial nephrectomy Left 06/17/2013    Procedure: ROBOTIC ASSITED PARTIAL NEPHRECTOMY;  Surgeon: Dutch Gray, MD;  Location: [facility];  Service: Urology;  Laterality: Left;  . Esophagogastroduodenoscopy (egd) with propofol N/A 08/19/2013    Procedure: ESOPHAGOGASTRODUODENOSCOPY (EGD) WITH PROPOFOL;  Surgeon: Milus Banister, MD;  Location: [facility];  Service: Endoscopy;  Laterality: N/A;  . Colonoscopy with propofol N/A 08/19/2013    Procedure: COLONOSCOPY WITH PROPOFOL;  Surgeon: Milus Banister, MD;  Location: [facility];  Service: Endoscopy;  Laterality: N/A;    Current Outpatient Prescriptions on File Prior to Visit  Medication Sig Dispense Refill  . EPINEPHrine (EPI-PEN) 0.3 mg/0.3 mL SOAJ  injection Inject 0.3 mLs (0.3 mg total) into the muscle once. 1 Device 1  . fish oil-omega-3 fatty acids 1000 MG capsule Take 1 g by mouth daily.    Marland Kitchen ibuprofen (ADVIL,MOTRIN) 800 MG tablet Take 1 tablet (800 mg total) by mouth 3 (three) times daily. 21 tablet 0  . sildenafil (REVATIO) 20 MG tablet Take 2 tablets by mouth for erectile dysfunction.  Do not take more than 1 dose in 24 hours. 30 tablet 0  . tetrahydrozoline 0.05 % ophthalmic solution Place 1 drop into both eyes 2 (two) times daily as needed (dry eyes).     No current facility-administered medications on file prior to visit.    Allergies  Allergen Reactions  . Bee Venom Shortness Of Breath and Swelling    Arm swells  . Hornet Venom Shortness Of Breath and Swelling    Arm swells    Family History  Problem Relation Age of Onset  . Cirrhosis Father   . Colitis Father   . Heart disease Father   . Asthma Father   . Heart attack Other     Paternal Grandparents  . Stroke Other     Paternal Grandparents  . Prostate cancer Paternal Grandfather   . Diabetes Maternal Grandfather   . Alcohol abuse Mother   . Other Brother     Intestinal Fissure  . Colon polyps Sister     intestinal problems  . Asthma Son   . Other Father     Chemical Imbalance  . Other Brother  Chemical Imbalance    History   Social History  . Marital Status: Divorced    Spouse Name: N/A  . Number of Children: 3  . Years of Education: N/A   Occupational History  .     Social History Main Topics  . Smoking status: Current Every Day Smoker -- 1.00 packs/day for 24 years    Types: Cigarettes  . Smokeless tobacco: Current User    Types: Chew  . Alcohol Use: 0.0 oz/week    0 Standard drinks or equivalent per week     Comment: occasional/social  . Drug Use: No  . Sexual Activity: Not on file   Other Topics Concern  . Not on file   Social History Narrative   Review of Systems  Constitutional: Negative for fever and weight loss.    HENT: Negative for ear discharge, ear pain, hearing loss and tinnitus.   Eyes: Negative for blurred vision, double vision, photophobia and pain.  Respiratory: Negative for cough and shortness of breath.   Cardiovascular: Negative for chest pain and palpitations.  Gastrointestinal: Negative for heartburn, nausea, vomiting, abdominal pain, diarrhea, constipation, blood in stool and melena.  Genitourinary: Negative for dysuria, urgency, frequency, hematuria and flank pain.  Musculoskeletal: Negative for falls.  Neurological: Negative for dizziness, loss of consciousness and headaches.  Endo/Heme/Allergies: Negative for environmental allergies.  Psychiatric/Behavioral: Negative for depression, suicidal ideas, hallucinations and substance abuse. The patient is not nervous/anxious and does not have insomnia.    BP 144/91 mmHg  Pulse 85  Temp(Src) 98.4 F (36.9 C) (Oral)  Ht 6' (1.829 m)  Wt 251 lb 9.6 oz (114.125 kg)  BMI 34.12 kg/m2  SpO2 97%  Physical Exam  Constitutional: He is oriented to person, place, and time and well-developed, well-nourished, and in no distress.  HENT:  Head: Normocephalic and atraumatic.  Right Ear: External ear normal.  Left Ear: External ear normal.  Nose: Nose normal.  Mouth/Throat: Oropharynx is clear and moist. No oropharyngeal exudate.  TM within normal limits bilaterally.  Eyes: Conjunctivae are normal. Pupils are equal, round, and reactive to light.  Neck: Neck supple. No thyromegaly present.  Cardiovascular: Normal rate, regular rhythm, normal heart sounds and intact distal pulses.   Pulmonary/Chest: Effort normal and breath sounds normal. No respiratory distress. He has no wheezes. He has no rales. He exhibits no tenderness.  Abdominal: Soft. Bowel sounds are normal. He exhibits no distension and no mass. There is no tenderness. There is no rebound and no guarding.  Genitourinary: Penis normal.  Musculoskeletal: Normal range of motion.   Lymphadenopathy:    He has no cervical adenopathy.  Neurological: He is alert and oriented to person, place, and time.  Skin: Skin is warm and dry. No rash noted.  Psychiatric: Affect normal.  Vitals reviewed.  Assessment/Plan: Need for diphtheria-tetanus-pertussis (Tdap) vaccine, adult/adolescent Immunization given by nursing staff.   STD exposure Asymptomatic. Exam within normal limits.  Will obtain STI panel today to further assess. Safe sex practices reviewed with patient.   Tobacco abuse Is smoking less but no desire to proceed with cessation.    Visit for preventive health examination Depression screen negative.  Immunizations updated today -- TDaP. Preventive care discussed.  Handout given in AVS.  Will obtain fasting labs today to include CBC, CMP, LFTs, TSH, A1C, UA and Lipid Panel.   Fatigue Unclear -- will check CBC, electrolytes, TSH and Testosterone level.   Obesity Will attempt trial of phentermine 30 mg.  Continue diet and exercise  regimen.  Follow-up in 1 month.

## 2015-03-07 NOTE — Assessment & Plan Note (Signed)
Will attempt trial of phentermine 30 mg.  Continue diet and exercise regimen.  Follow-up in 1 month.

## 2015-03-07 NOTE — Assessment & Plan Note (Signed)
Asymptomatic. Exam within normal limits.  Will obtain STI panel today to further assess. Safe sex practices reviewed with patient.

## 2015-03-08 LAB — URINE CYTOLOGY ANCILLARY ONLY
CHLAMYDIA, DNA PROBE: NEGATIVE
NEISSERIA GONORRHEA: NEGATIVE
Trichomonas: NEGATIVE

## 2015-03-08 LAB — HIV ANTIBODY (ROUTINE TESTING W REFLEX): HIV 1&2 Ab, 4th Generation: NONREACTIVE

## 2015-03-08 LAB — RPR

## 2015-03-09 LAB — HSV(HERPES SMPLX)ABS-I+II(IGG+IGM)-BLD
HERPES SIMPLEX VRS I-IGM AB (EIA): 1.97 {index} — AB
HSV 1 Glycoprotein G Ab, IgG: 10.34 IV — ABNORMAL HIGH
HSV 2 Glycoprotein G Ab, IgG: 0.19 IV

## 2015-03-09 LAB — URINE CYTOLOGY ANCILLARY ONLY
BACTERIAL VAGINITIS: POSITIVE — AB
Candida vaginitis: NEGATIVE

## 2015-03-16 ENCOUNTER — Emergency Department (HOSPITAL_BASED_OUTPATIENT_CLINIC_OR_DEPARTMENT_OTHER)
Admission: EM | Admit: 2015-03-16 | Discharge: 2015-03-16 | Disposition: A | Discharge status: Home / Self Care | Payer: No Typology Code available for payment source | Attending: Emergency Medicine | Admitting: Emergency Medicine

## 2015-03-16 ENCOUNTER — Emergency Department (HOSPITAL_BASED_OUTPATIENT_CLINIC_OR_DEPARTMENT_OTHER): Payer: No Typology Code available for payment source

## 2015-03-16 ENCOUNTER — Encounter (HOSPITAL_BASED_OUTPATIENT_CLINIC_OR_DEPARTMENT_OTHER): Payer: Self-pay | Admitting: *Deleted

## 2015-03-16 DIAGNOSIS — Z8659 Personal history of other mental and behavioral disorders: Secondary | ICD-10-CM | POA: Diagnosis not present

## 2015-03-16 DIAGNOSIS — Z85528 Personal history of other malignant neoplasm of kidney: Secondary | ICD-10-CM | POA: Insufficient documentation

## 2015-03-16 DIAGNOSIS — Z720 Tobacco use: Secondary | ICD-10-CM | POA: Diagnosis not present

## 2015-03-16 DIAGNOSIS — Y998 Other external cause status: Secondary | ICD-10-CM | POA: Insufficient documentation

## 2015-03-16 DIAGNOSIS — Z791 Long term (current) use of non-steroidal anti-inflammatories (NSAID): Secondary | ICD-10-CM | POA: Diagnosis not present

## 2015-03-16 DIAGNOSIS — S3992XA Unspecified injury of lower back, initial encounter: Secondary | ICD-10-CM | POA: Insufficient documentation

## 2015-03-16 DIAGNOSIS — Z79899 Other long term (current) drug therapy: Secondary | ICD-10-CM | POA: Insufficient documentation

## 2015-03-16 DIAGNOSIS — Z8619 Personal history of other infectious and parasitic diseases: Secondary | ICD-10-CM | POA: Diagnosis not present

## 2015-03-16 DIAGNOSIS — Y92410 Unspecified street and highway as the place of occurrence of the external cause: Secondary | ICD-10-CM | POA: Insufficient documentation

## 2015-03-16 DIAGNOSIS — Y9389 Activity, other specified: Secondary | ICD-10-CM | POA: Diagnosis not present

## 2015-03-16 DIAGNOSIS — S22080S Wedge compression fracture of T11-T12 vertebra, sequela: Secondary | ICD-10-CM | POA: Diagnosis not present

## 2015-03-16 DIAGNOSIS — Z872 Personal history of diseases of the skin and subcutaneous tissue: Secondary | ICD-10-CM | POA: Insufficient documentation

## 2015-03-16 DIAGNOSIS — S22000S Wedge compression fracture of unspecified thoracic vertebra, sequela: Secondary | ICD-10-CM

## 2015-03-16 LAB — URINALYSIS, ROUTINE W REFLEX MICROSCOPIC
BILIRUBIN URINE: NEGATIVE
Glucose, UA: NEGATIVE mg/dL
HGB URINE DIPSTICK: NEGATIVE
KETONES UR: NEGATIVE mg/dL
Leukocytes, UA: NEGATIVE
Nitrite: NEGATIVE
PROTEIN: NEGATIVE mg/dL
Specific Gravity, Urine: 1.008 (ref 1.005–1.030)
Urobilinogen, UA: 0.2 mg/dL (ref 0.0–1.0)
pH: 6.5 (ref 5.0–8.0)

## 2015-03-16 MED ORDER — IBUPROFEN 800 MG PO TABS
800.0000 mg | ORAL_TABLET | Freq: Once | ORAL | Status: AC
  Administered 2015-03-16: 800 mg via ORAL
  Filled 2015-03-16: qty 1

## 2015-03-16 MED ORDER — HYDROCODONE-ACETAMINOPHEN 5-325 MG PO TABS: 1.0000 | ORAL_TABLET | ORAL | Status: DC | PRN

## 2015-03-16 MED ORDER — HYDROCODONE-ACETAMINOPHEN 5-325 MG PO TABS
1.0000 | ORAL_TABLET | Freq: Once | ORAL | Status: AC
  Administered 2015-03-16: 1 via ORAL
  Filled 2015-03-16: qty 1

## 2015-03-16 MED ORDER — NAPROXEN 500 MG PO TABS: 500.0000 mg | ORAL_TABLET | Freq: Two times a day (BID) | ORAL | Status: DC
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
## 2015-03-16 NOTE — Discharge Instructions (Signed)

## 2015-03-20 ENCOUNTER — Other Ambulatory Visit: Payer: Self-pay | Admitting: Physician Assistant

## 2015-03-21 ENCOUNTER — Ambulatory Visit (INDEPENDENT_AMBULATORY_CARE_PROVIDER_SITE_OTHER): Payer: Self-pay | Admitting: Physician Assistant

## 2015-03-21 ENCOUNTER — Encounter: Payer: Self-pay | Admitting: Physician Assistant

## 2015-03-21 DIAGNOSIS — L03012 Cellulitis of left finger: Secondary | ICD-10-CM | POA: Diagnosis not present

## 2015-03-21 MED ORDER — METHYLPREDNISOLONE 4 MG PO TBPK: ORAL_TABLET | ORAL | Status: DC

## 2015-03-21 MED ORDER — HYDROCODONE-ACETAMINOPHEN 5-325 MG PO TABS: 1.0000 | ORAL_TABLET | ORAL | Status: DC | PRN

## 2015-03-21 MED ORDER — DOXYCYCLINE HYCLATE 100 MG PO CAPS: 100.0000 mg | ORAL_CAPSULE | Freq: Two times a day (BID) | ORAL | Status: DC

## 2015-03-21 MED ORDER — METHOCARBAMOL 500 MG PO TABS: 500.0000 mg | ORAL_TABLET | Freq: Three times a day (TID) | ORAL | Status: DC

## 2015-03-21 NOTE — Assessment & Plan Note (Signed)
No palpable induration noted on exam.  Rx Doxycycline.

## 2015-03-21 NOTE — Progress Notes (Signed)
Pre visit review using our clinic review tool, if applicable. No additional management support is needed unless otherwise documented below in the visit note. 
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
## 2015-03-21 NOTE — Patient Instructions (Signed)
Please take the medrol dose pack and robaxin as directed. Use Norco as directed if needed for breakthrough pain. Avoid heavy lifting or overexertion. Apply topical Aspercreme to lower back. Try some of the stretching exercises listed below.  Please take the antibiotic as directed for the skin infection.
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
## 2015-03-22 ENCOUNTER — Other Ambulatory Visit: Payer: Self-pay | Admitting: Physician Assistant

## 2015-03-28 ENCOUNTER — Telehealth: Payer: Self-pay | Admitting: Physician Assistant

## 2015-03-28 DIAGNOSIS — M5134 Other intervertebral disc degeneration, thoracic region: Secondary | ICD-10-CM

## 2015-03-28 NOTE — Telephone Encounter (Signed)
Requesting Hydrocodone-acetaminopher 5-325mg -Take 1 tablet by mouth every 4 hours as needed.                    Methocarbamol 500mg -Take 1 tablet by mouth three times daily. Last refill:03-21-15;#10,0                03-21-15;#60,0 Last OV:03-21-15 No UDS done. Please advise.//AB/CMA

## 2015-03-28 NOTE — Telephone Encounter (Signed)
Caller name: Jiles Relation to pt: self Call back number: [phone] Pharmacy:  Reason for call:   Requesting a refill of muscle relaxer and hydrocodone.

## 2015-03-29 ENCOUNTER — Telehealth: Payer: Self-pay | Admitting: Physician Assistant

## 2015-03-29 MED ORDER — HYDROCODONE-ACETAMINOPHEN 5-325 MG PO TABS: 1.0000 | ORAL_TABLET | ORAL | Status: DC | PRN

## 2015-03-29 NOTE — Telephone Encounter (Signed)
Patient was supposed to follow-up in 1 week and did not.  He will need appointment for re-evaluation before we can determine need to be out of work.

## 2015-03-29 NOTE — Telephone Encounter (Signed)
Will allow one fill of Hydrocodone. He can come pick up.  If he is taking the Robaxin as directed TID, that should be 20 days of medicine and as such he is not due.

## 2015-03-29 NOTE — Telephone Encounter (Signed)
Pt returned your call. He said he did not need refill on the Robaxin. I informed him the Hydrocodone rx is in front to pick up.

## 2015-03-29 NOTE — Telephone Encounter (Signed)
Called and [facility] @ 10:13am @ [phone]) asking the pt to RTC regarding med request.//AB/CMA

## 2015-03-29 NOTE — Telephone Encounter (Signed)
Caller name: Giorgi Relation to pt: self Call back number: [phone] Pharmacy:  Reason for call:   Patient requesting to be written out of work regarding his back pain. He wants to talk to [facility] as to how long he needs to be out of work

## 2015-03-29 NOTE — Telephone Encounter (Signed)
Appointment scheduled for 03/31/15

## 2015-03-31 ENCOUNTER — Encounter: Payer: Self-pay | Admitting: Physician Assistant

## 2015-03-31 ENCOUNTER — Ambulatory Visit (INDEPENDENT_AMBULATORY_CARE_PROVIDER_SITE_OTHER): Payer: 59 | Admitting: Physician Assistant

## 2015-03-31 VITALS — BP 129/86 | HR 87 | Temp 97.9°F | Resp 16 | Ht 73.0 in | Wt 254.4 lb

## 2015-03-31 DIAGNOSIS — M546 Pain in thoracic spine: Secondary | ICD-10-CM

## 2015-03-31 DIAGNOSIS — Z91030 Bee allergy status: Secondary | ICD-10-CM

## 2015-03-31 MED ORDER — HYDROCODONE-ACETAMINOPHEN 10-325 MG PO TABS: 1.0000 | ORAL_TABLET | Freq: Three times a day (TID) | ORAL | Status: DC | PRN

## 2015-03-31 MED ORDER — EPINEPHRINE 0.3 MG/0.3ML IJ SOAJ: 0.3000 mg | Freq: Once | INTRAMUSCULAR | Status: DC

## 2015-03-31 MED ORDER — CARISOPRODOL 350 MG PO TABS: 350.0000 mg | ORAL_TABLET | Freq: Three times a day (TID) | ORAL | Status: DC

## 2015-03-31 NOTE — Patient Instructions (Signed)
Please take medications as directed. Do not take Soma if you need to drive or operate machinery. Continue supportive measures discussed at your last visit. You will be contacted to schedule an MRI. I will call you once your results are in.

## 2015-03-31 NOTE — Progress Notes (Signed)
Pre visit review using our clinic review tool, if applicable. No additional management support is needed unless otherwise documented below in the visit note/SLS  
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
## 2015-04-03 ENCOUNTER — Other Ambulatory Visit: Payer: Self-pay | Admitting: Physician Assistant

## 2015-04-05 ENCOUNTER — Ambulatory Visit
Admission: RE | Admit: 2015-04-05 | Discharge: 2015-04-05 | Disposition: A | Discharge status: Home / Self Care | Payer: 59 | Source: Ambulatory Visit | Attending: Physician Assistant | Admitting: Physician Assistant

## 2015-04-05 ENCOUNTER — Other Ambulatory Visit: Payer: Self-pay | Admitting: Physician Assistant

## 2015-04-05 DIAGNOSIS — M5134 Other intervertebral disc degeneration, thoracic region: Secondary | ICD-10-CM

## 2015-04-05 DIAGNOSIS — M519 Unspecified thoracic, thoracolumbar and lumbosacral intervertebral disc disorder: Secondary | ICD-10-CM

## 2015-04-07 ENCOUNTER — Ambulatory Visit: Payer: Self-pay | Admitting: Physician Assistant

## 2015-04-07 ENCOUNTER — Telehealth: Payer: Self-pay | Admitting: *Deleted
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
## 2015-04-10 NOTE — Telephone Encounter (Signed)
Caller name: Andres Bantz Relationship to patient: self Can be reached: [phone]  Reason for call: Pt requesting call when you are available regarding issues below I believe.

## 2015-04-18 ENCOUNTER — Telehealth: Payer: Self-pay | Admitting: Physician Assistant

## 2015-04-18 MED ORDER — SILDENAFIL CITRATE 20 MG PO TABS: ORAL_TABLET | ORAL | Status: DC

## 2015-04-18 MED ORDER — PHENTERMINE HCL 37.5 MG PO CAPS: 37.5000 mg | ORAL_CAPSULE | ORAL | Status: DC

## 2015-04-18 NOTE — Telephone Encounter (Signed)
Pt questioning dose increase of the Phentermine.  Requesting Sildenafil 20mg -Take 2 tablets by mouth for Erective Dysfunction.  Do not take more than 1 dose in 24 hours. Last refill:04-03-15;#30,0 Last OV:03-31-15 Please advise.//AB/CMA

## 2015-04-18 NOTE — Telephone Encounter (Signed)
Relation to pt: self  Call back number: [phone] Pharmacy: [id number] [place], [address]  Reason for call:  Pt states phentermine MG were suppose to be increased from 30MG  to (does not know the MG?) Pt requesting a refill sildenafil (REVATIO) 20 MG tablet

## 2015-04-18 NOTE — Telephone Encounter (Signed)
Rx for Sildenafil was sent to the pharmacy by e-script.  Rx for Phentermine was faxed to the pharmacy.  Confirmation received.//AB/CMA

## 2015-04-18 NOTE — Telephone Encounter (Signed)
Ok to refill the Sildenafil. Do not remember discussing increase in phentermine with patient.  Vitals were stable so ok to increased to Phetermine 37.5 mg tablet. Can send in Rx with 0 refills. This will be last month of medication.

## 2015-04-19 ENCOUNTER — Telehealth: Payer: Self-pay | Admitting: *Deleted

## 2015-04-19 NOTE — Telephone Encounter (Signed)
Prior authorization for sildenafil initiated. Awaiting determination. JG//CMA

## 2015-04-21 NOTE — Telephone Encounter (Signed)
PA denied. [employer] states if the pt has a diagnosis of Pulmonary Arterial Hypertension, the PA could be approved. [employer] states if pt decides to stay on medication, he will be responsible of the full or additional cost.

## 2015-04-27 ENCOUNTER — Telehealth: Payer: Self-pay | Admitting: Physician Assistant

## 2015-04-27 DIAGNOSIS — M5134 Other intervertebral disc degeneration, thoracic region: Secondary | ICD-10-CM

## 2015-04-27 NOTE — Telephone Encounter (Signed)
Caller name: Zamar Relation to pt: Call back number: [phone] Pharmacy:  Reason for call:   Patient is requesting hydrocodone 10/325 refill.

## 2015-04-28 NOTE — Telephone Encounter (Signed)
Requesting Hydrocodone-acet 10/325mg -Take 1 tablet by mouth every 8 hours as needed. Last refill:03/31/15;#90,0 Last OV:03/31/15 No UDS done. Please advise.//AB/CMA

## 2015-05-01 ENCOUNTER — Encounter: Payer: Self-pay | Admitting: Physician Assistant

## 2015-05-01 MED ORDER — HYDROCODONE-ACETAMINOPHEN 10-325 MG PO TABS: 1.0000 | ORAL_TABLET | Freq: Three times a day (TID) | ORAL | Status: DC | PRN

## 2015-05-01 NOTE — Telephone Encounter (Signed)
Refill at front desk for pickup. Will need to sign attached CSC and give UDS at time of pickup per office policy as this has been more than a one-time Rx.
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
## 2015-05-17 ENCOUNTER — Telehealth: Payer: Self-pay | Admitting: Physician Assistant

## 2015-05-17 DIAGNOSIS — M5134 Other intervertebral disc degeneration, thoracic region: Secondary | ICD-10-CM

## 2015-05-17 MED ORDER — CARISOPRODOL 350 MG PO TABS: 350.0000 mg | ORAL_TABLET | Freq: Three times a day (TID) | ORAL | Status: DC

## 2015-05-17 MED ORDER — PHENTERMINE HCL 37.5 MG PO CAPS
37.5000 mg | ORAL_CAPSULE | ORAL | Status: DC
Start: 2015-05-17 — End: 2016-08-07

## 2015-05-17 NOTE — Telephone Encounter (Signed)
Caller name: Staley Lunz Relationship to patient: self Can be reached: [phone] [facility] [place]  Reason for call: Pt called in for phentermine. He said that he's only had it filled 2 times and [name] told him he could get 3. Pt would like return call. Would like to get up to 5 if allowed (see note below on refusal).

## 2015-05-17 NOTE — Addendum Note (Signed)
Addended by: Harl Bowie on: 05/17/2015 06:40 PM   Modules accepted: Orders

## 2015-05-17 NOTE — Telephone Encounter (Signed)
Called and spoke with the pt and informed him we will be refilling the Phentermine.  Pt also asked if he could also have a refill on the Soma.  Verbally informed [name] of the request for the [facility] and he verbally agreed to the request.  Informed the pt that the Manuela Neptune was also approved.  Both rx's were faxed to the pharmacy.   Confirmation received.//AB/CMA

## 2015-06-03 ENCOUNTER — Other Ambulatory Visit: Payer: Self-pay | Admitting: Physician Assistant

## 2015-06-19 ENCOUNTER — Telehealth: Payer: Self-pay | Admitting: Physician Assistant
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
## 2015-06-27 ENCOUNTER — Telehealth: Payer: Self-pay | Admitting: Physician Assistant

## 2015-06-27 DIAGNOSIS — M5134 Other intervertebral disc degeneration, thoracic region: Secondary | ICD-10-CM

## 2015-06-27 MED ORDER — CARISOPRODOL 350 MG PO TABS: 350.0000 mg | ORAL_TABLET | Freq: Three times a day (TID) | ORAL | Status: DC

## 2015-06-27 NOTE — Telephone Encounter (Signed)
Patient informed, understood & agreed/SLS  

## 2015-06-27 NOTE — Telephone Encounter (Signed)
Relation to KV:TXLE Call back number:[phone] Pharmacy: [id number] - [place], [place] [address]  Reason for call:  Patient requesting a refill carisoprodol (SOMA) 350 MG tablet

## 2015-06-27 NOTE — Telephone Encounter (Signed)
Will grant one refill. Needs follow-up appointment before further fills will be given.

## 2015-10-17 ENCOUNTER — Telehealth: Payer: Self-pay | Admitting: Physician Assistant

## 2015-10-17 NOTE — Telephone Encounter (Signed)
Pt never picked up rx for Hydrocodone 5-325 mg from March 29, 2015-pt also had Controlled Substance Contract that has not been filled out from 05-01-2015(shredded)

## 2015-10-17 NOTE — Telephone Encounter (Signed)
Pt never picked up also rx for Hydrocodone for May 01, 2015 (shredded)

## 2015-11-03 ENCOUNTER — Other Ambulatory Visit: Payer: Self-pay | Admitting: Physician Assistant

## 2016-02-07 ENCOUNTER — Other Ambulatory Visit: Payer: Self-pay | Admitting: Physician Assistant

## 2016-02-14 ENCOUNTER — Emergency Department (HOSPITAL_COMMUNITY): Payer: Medicaid Other

## 2016-02-14 ENCOUNTER — Inpatient Hospital Stay (HOSPITAL_COMMUNITY)
Admission: EM | Admit: 2016-02-14 | Discharge: 2016-03-05 | DRG: 003 | Disposition: A | Discharge status: Home / Self Care | Payer: Medicaid Other | Attending: General Surgery | Admitting: General Surgery

## 2016-02-14 ENCOUNTER — Encounter (HOSPITAL_COMMUNITY): Payer: Self-pay | Admitting: *Deleted

## 2016-02-14 DIAGNOSIS — T17990A Other foreign object in respiratory tract, part unspecified in causing asphyxiation, initial encounter: Secondary | ICD-10-CM | POA: Diagnosis not present

## 2016-02-14 DIAGNOSIS — S27321A Contusion of lung, unilateral, initial encounter: Secondary | ICD-10-CM | POA: Diagnosis present

## 2016-02-14 DIAGNOSIS — S2242XA Multiple fractures of ribs, left side, initial encounter for closed fracture: Secondary | ICD-10-CM

## 2016-02-14 DIAGNOSIS — J9690 Respiratory failure, unspecified, unspecified whether with hypoxia or hypercapnia: Secondary | ICD-10-CM

## 2016-02-14 DIAGNOSIS — Z85528 Personal history of other malignant neoplasm of kidney: Secondary | ICD-10-CM

## 2016-02-14 DIAGNOSIS — J9811 Atelectasis: Secondary | ICD-10-CM | POA: Diagnosis not present

## 2016-02-14 DIAGNOSIS — J942 Hemothorax: Secondary | ICD-10-CM

## 2016-02-14 DIAGNOSIS — J939 Pneumothorax, unspecified: Secondary | ICD-10-CM

## 2016-02-14 DIAGNOSIS — S0266XA Fracture of symphysis of mandible, initial encounter for closed fracture: Secondary | ICD-10-CM | POA: Diagnosis present

## 2016-02-14 DIAGNOSIS — E876 Hypokalemia: Secondary | ICD-10-CM | POA: Diagnosis not present

## 2016-02-14 DIAGNOSIS — Z4659 Encounter for fitting and adjustment of other gastrointestinal appliance and device: Secondary | ICD-10-CM

## 2016-02-14 DIAGNOSIS — S225XXA Flail chest, initial encounter for closed fracture: Principal | ICD-10-CM

## 2016-02-14 DIAGNOSIS — J156 Pneumonia due to other aerobic Gram-negative bacteria: Secondary | ICD-10-CM | POA: Diagnosis not present

## 2016-02-14 DIAGNOSIS — Z419 Encounter for procedure for purposes other than remedying health state, unspecified: Secondary | ICD-10-CM

## 2016-02-14 DIAGNOSIS — S02609A Fracture of mandible, unspecified, initial encounter for closed fracture: Secondary | ICD-10-CM | POA: Diagnosis present

## 2016-02-14 DIAGNOSIS — R0602 Shortness of breath: Secondary | ICD-10-CM

## 2016-02-14 DIAGNOSIS — S2249XA Multiple fractures of ribs, unspecified side, initial encounter for closed fracture: Secondary | ICD-10-CM

## 2016-02-14 DIAGNOSIS — T85698A Other mechanical complication of other specified internal prosthetic devices, implants and grafts, initial encounter: Secondary | ICD-10-CM

## 2016-02-14 DIAGNOSIS — S2243XA Multiple fractures of ribs, bilateral, initial encounter for closed fracture: Secondary | ICD-10-CM

## 2016-02-14 DIAGNOSIS — Z4682 Encounter for fitting and adjustment of non-vascular catheter: Secondary | ICD-10-CM

## 2016-02-14 DIAGNOSIS — S43102A Unspecified dislocation of left acromioclavicular joint, initial encounter: Secondary | ICD-10-CM | POA: Diagnosis present

## 2016-02-14 DIAGNOSIS — G9340 Encephalopathy, unspecified: Secondary | ICD-10-CM | POA: Diagnosis not present

## 2016-02-14 DIAGNOSIS — S2232XA Fracture of one rib, left side, initial encounter for closed fracture: Secondary | ICD-10-CM

## 2016-02-14 DIAGNOSIS — J9601 Acute respiratory failure with hypoxia: Secondary | ICD-10-CM

## 2016-02-14 DIAGNOSIS — Z9689 Presence of other specified functional implants: Secondary | ICD-10-CM

## 2016-02-14 DIAGNOSIS — Y906 Blood alcohol level of 120-199 mg/100 ml: Secondary | ICD-10-CM | POA: Diagnosis present

## 2016-02-14 DIAGNOSIS — D62 Acute posthemorrhagic anemia: Secondary | ICD-10-CM | POA: Diagnosis not present

## 2016-02-14 DIAGNOSIS — R0902 Hypoxemia: Secondary | ICD-10-CM

## 2016-02-14 DIAGNOSIS — S02672A Fracture of alveolus of left mandible, initial encounter for closed fracture: Secondary | ICD-10-CM | POA: Diagnosis present

## 2016-02-14 DIAGNOSIS — T1490XA Injury, unspecified, initial encounter: Secondary | ICD-10-CM

## 2016-02-14 DIAGNOSIS — F17210 Nicotine dependence, cigarettes, uncomplicated: Secondary | ICD-10-CM | POA: Diagnosis present

## 2016-02-14 DIAGNOSIS — Z8781 Personal history of (healed) traumatic fracture: Secondary | ICD-10-CM

## 2016-02-14 DIAGNOSIS — Z6835 Body mass index (BMI) 35.0-35.9, adult: Secondary | ICD-10-CM

## 2016-02-14 DIAGNOSIS — S02609B Fracture of mandible, unspecified, initial encounter for open fracture: Secondary | ICD-10-CM

## 2016-02-14 DIAGNOSIS — N179 Acute kidney failure, unspecified: Secondary | ICD-10-CM | POA: Diagnosis not present

## 2016-02-14 DIAGNOSIS — H532 Diplopia: Secondary | ICD-10-CM | POA: Diagnosis not present

## 2016-02-14 DIAGNOSIS — S272XXA Traumatic hemopneumothorax, initial encounter: Secondary | ICD-10-CM | POA: Diagnosis present

## 2016-02-14 DIAGNOSIS — S2220XA Unspecified fracture of sternum, initial encounter for closed fracture: Secondary | ICD-10-CM | POA: Diagnosis present

## 2016-02-14 DIAGNOSIS — I959 Hypotension, unspecified: Secondary | ICD-10-CM

## 2016-02-14 DIAGNOSIS — E46 Unspecified protein-calorie malnutrition: Secondary | ICD-10-CM | POA: Diagnosis present

## 2016-02-14 DIAGNOSIS — J189 Pneumonia, unspecified organism: Secondary | ICD-10-CM

## 2016-02-14 DIAGNOSIS — S22009A Unspecified fracture of unspecified thoracic vertebra, initial encounter for closed fracture: Secondary | ICD-10-CM | POA: Diagnosis present

## 2016-02-14 DIAGNOSIS — S299XXA Unspecified injury of thorax, initial encounter: Secondary | ICD-10-CM

## 2016-02-14 HISTORY — DX: Malignant (primary) neoplasm, unspecified: C80.1

## 2016-02-14 HISTORY — DX: Personal history of other malignant neoplasm of kidney: Z85.528

## 2016-02-14 LAB — I-STAT CG4 LACTIC ACID, ED: Lactic Acid, Venous: 3.65 mmol/L (ref 0.5–2.0)

## 2016-02-14 LAB — I-STAT CHEM 8, ED
BUN: 14 mg/dL (ref 6–20)
CREATININE: 1.6 mg/dL — AB (ref 0.61–1.24)
Calcium, Ion: 1.04 mmol/L — ABNORMAL LOW (ref 1.12–1.23)
Chloride: 103 mmol/L (ref 101–111)
Glucose, Bld: 129 mg/dL — ABNORMAL HIGH (ref 65–99)
HEMATOCRIT: 52 % (ref 39.0–52.0)
Hemoglobin: 17.7 g/dL — ABNORMAL HIGH (ref 13.0–17.0)
POTASSIUM: 3.6 mmol/L (ref 3.5–5.1)
Sodium: 141 mmol/L (ref 135–145)
TCO2: 20 mmol/L (ref 0–100)

## 2016-02-14 MED ORDER — SODIUM CHLORIDE 0.9 % IV BOLUS (SEPSIS): 1000.0000 mL | Freq: Once | INTRAVENOUS | Status: DC

## 2016-02-14 MED ORDER — MIDAZOLAM HCL 2 MG/2ML IJ SOLN
INTRAMUSCULAR | Status: AC
  Filled 2016-02-14: qty 2

## 2016-02-14 MED ORDER — ONDANSETRON HCL 4 MG/2ML IJ SOLN
INTRAMUSCULAR | Status: AC
  Filled 2016-02-14: qty 2

## 2016-02-14 MED ORDER — TETANUS-DIPHTH-ACELL PERTUSSIS 5-2.5-18.5 LF-MCG/0.5 IM SUSP
0.5000 mL | Freq: Once | INTRAMUSCULAR | Status: AC
Start: 2016-02-14 — End: 2016-02-14
  Administered 2016-02-14: 0.5 mL via INTRAMUSCULAR

## 2016-02-14 MED ORDER — IOPAMIDOL (ISOVUE-300) INJECTION 61%
INTRAVENOUS | Status: AC
  Administered 2016-02-15: 100 mL
  Filled 2016-02-14: qty 100

## 2016-02-14 MED ORDER — FENTANYL CITRATE (PF) 100 MCG/2ML IJ SOLN
INTRAMUSCULAR | Status: AC
  Filled 2016-02-14: qty 2

## 2016-02-14 MED ORDER — ONDANSETRON HCL 4 MG/2ML IJ SOLN
4.0000 mg | Freq: Once | INTRAMUSCULAR | Status: AC
  Administered 2016-02-14: 4 mg via INTRAVENOUS

## 2016-02-14 MED ORDER — SODIUM CHLORIDE 0.9 % IV SOLN: INTRAVENOUS | Status: DC

## 2016-02-14 MED ORDER — FENTANYL CITRATE (PF) 100 MCG/2ML IJ SOLN
INTRAMUSCULAR | Status: DC | PRN
  Administered 2016-02-14 – 2016-02-15 (×4): 100 ug via INTRAVENOUS

## 2016-02-14 MED ORDER — LIDOCAINE-EPINEPHRINE 1 %-1:100000 IJ SOLN: 20.0000 mL | Freq: Once | INTRAMUSCULAR | Status: DC

## 2016-02-14 MED ORDER — MIDAZOLAM HCL 5 MG/5ML IJ SOLN
INTRAMUSCULAR | Status: DC | PRN
  Administered 2016-02-14 – 2016-02-15 (×2): 4 mg via INTRAVENOUS

## 2016-02-14 MED ORDER — SODIUM CHLORIDE 0.9 % IV SOLN
INTRAVENOUS | Status: DC | PRN
  Administered 2016-02-14 – 2016-02-15 (×2): 1000 mL via INTRAVENOUS

## 2016-02-14 MED ORDER — TETANUS-DIPHTH-ACELL PERTUSSIS 5-2.5-18.5 LF-MCG/0.5 IM SUSP
INTRAMUSCULAR | Status: AC
  Filled 2016-02-14: qty 0.5

## 2016-02-14 NOTE — ED Notes (Signed)
Dr Hulen Skains inserting chest tube to left chest.
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
## 2016-02-14 NOTE — ED Notes (Signed)
sats fell to 81%, pt placed on non-rebreather. sats up to 93%.
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
## 2016-02-14 NOTE — Progress Notes (Signed)
RT responded to Level 1, pt alert and talking. In great pain. Chest rise symetrical, sats 95% on 2L. Will cont to monitor

## 2016-02-15 ENCOUNTER — Inpatient Hospital Stay (HOSPITAL_COMMUNITY): Payer: Medicaid Other

## 2016-02-15 ENCOUNTER — Other Ambulatory Visit (HOSPITAL_COMMUNITY): Payer: Self-pay

## 2016-02-15 ENCOUNTER — Emergency Department (HOSPITAL_COMMUNITY): Payer: Medicaid Other

## 2016-02-15 DIAGNOSIS — S272XXA Traumatic hemopneumothorax, initial encounter: Secondary | ICD-10-CM | POA: Diagnosis present

## 2016-02-15 DIAGNOSIS — S02672A Fracture of alveolus of left mandible, initial encounter for closed fracture: Secondary | ICD-10-CM | POA: Diagnosis present

## 2016-02-15 DIAGNOSIS — S2220XA Unspecified fracture of sternum, initial encounter for closed fracture: Secondary | ICD-10-CM | POA: Diagnosis present

## 2016-02-15 DIAGNOSIS — S225XXA Flail chest, initial encounter for closed fracture: Secondary | ICD-10-CM | POA: Diagnosis present

## 2016-02-15 DIAGNOSIS — H532 Diplopia: Secondary | ICD-10-CM | POA: Diagnosis not present

## 2016-02-15 DIAGNOSIS — S0266XA Fracture of symphysis of mandible, initial encounter for closed fracture: Secondary | ICD-10-CM | POA: Diagnosis present

## 2016-02-15 DIAGNOSIS — G9340 Encephalopathy, unspecified: Secondary | ICD-10-CM | POA: Diagnosis not present

## 2016-02-15 DIAGNOSIS — S43102A Unspecified dislocation of left acromioclavicular joint, initial encounter: Secondary | ICD-10-CM | POA: Diagnosis present

## 2016-02-15 DIAGNOSIS — E876 Hypokalemia: Secondary | ICD-10-CM | POA: Diagnosis not present

## 2016-02-15 DIAGNOSIS — E46 Unspecified protein-calorie malnutrition: Secondary | ICD-10-CM | POA: Diagnosis present

## 2016-02-15 DIAGNOSIS — J156 Pneumonia due to other aerobic Gram-negative bacteria: Secondary | ICD-10-CM | POA: Diagnosis not present

## 2016-02-15 DIAGNOSIS — Y906 Blood alcohol level of 120-199 mg/100 ml: Secondary | ICD-10-CM | POA: Diagnosis present

## 2016-02-15 DIAGNOSIS — Z6835 Body mass index (BMI) 35.0-35.9, adult: Secondary | ICD-10-CM | POA: Diagnosis not present

## 2016-02-15 DIAGNOSIS — N179 Acute kidney failure, unspecified: Secondary | ICD-10-CM | POA: Diagnosis present

## 2016-02-15 DIAGNOSIS — D62 Acute posthemorrhagic anemia: Secondary | ICD-10-CM | POA: Diagnosis present

## 2016-02-15 DIAGNOSIS — F17210 Nicotine dependence, cigarettes, uncomplicated: Secondary | ICD-10-CM | POA: Diagnosis present

## 2016-02-15 DIAGNOSIS — J9601 Acute respiratory failure with hypoxia: Secondary | ICD-10-CM | POA: Diagnosis not present

## 2016-02-15 DIAGNOSIS — T17990A Other foreign object in respiratory tract, part unspecified in causing asphyxiation, initial encounter: Secondary | ICD-10-CM | POA: Diagnosis not present

## 2016-02-15 DIAGNOSIS — S27321A Contusion of lung, unilateral, initial encounter: Secondary | ICD-10-CM | POA: Diagnosis present

## 2016-02-15 DIAGNOSIS — S02600A Fracture of unspecified part of body of mandible, initial encounter for closed fracture: Secondary | ICD-10-CM

## 2016-02-15 DIAGNOSIS — J9811 Atelectasis: Secondary | ICD-10-CM | POA: Diagnosis not present

## 2016-02-15 DIAGNOSIS — Z85528 Personal history of other malignant neoplasm of kidney: Secondary | ICD-10-CM | POA: Diagnosis not present

## 2016-02-15 LAB — URINALYSIS, ROUTINE W REFLEX MICROSCOPIC
Bilirubin Urine: NEGATIVE
GLUCOSE, UA: NEGATIVE mg/dL
HGB URINE DIPSTICK: NEGATIVE
KETONES UR: NEGATIVE mg/dL
LEUKOCYTES UA: NEGATIVE
Nitrite: NEGATIVE
PROTEIN: NEGATIVE mg/dL
Specific Gravity, Urine: 1.02 (ref 1.005–1.030)
pH: 5.5 (ref 5.0–8.0)

## 2016-02-15 LAB — BLOOD GAS, ARTERIAL
ACID-BASE DEFICIT: 1.8 mmol/L (ref 0.0–2.0)
Bicarbonate: 23.1 mEq/L (ref 20.0–24.0)
FIO2: 0.5
MECHVT: 600 mL
O2 SAT: 98.2 %
PEEP: 5 cmH2O
PH ART: 7.337 — AB (ref 7.350–7.450)
Patient temperature: 98.4
RATE: 18 resp/min
TCO2: 24.5 mmol/L (ref 0–100)
pCO2 arterial: 44.3 mmHg (ref 35.0–45.0)
pO2, Arterial: 122 mmHg — ABNORMAL HIGH (ref 80.0–100.0)

## 2016-02-15 LAB — TYPE AND SCREEN
ABO/RH(D): A POS
Antibody Screen: NEGATIVE
UNIT DIVISION: 0
UNIT DIVISION: 0

## 2016-02-15 LAB — BASIC METABOLIC PANEL
ANION GAP: 9 (ref 5–15)
BUN: 13 mg/dL (ref 6–20)
CO2: 23 mmol/L (ref 22–32)
CREATININE: 1.25 mg/dL — AB (ref 0.61–1.24)
Calcium: 8.4 mg/dL — ABNORMAL LOW (ref 8.9–10.3)
Chloride: 108 mmol/L (ref 101–111)
GFR calc non Af Amer: >60 mL/min (ref >60)
Glucose, Bld: 133 mg/dL — ABNORMAL HIGH (ref 65–99)
Potassium: 4.8 mmol/L (ref 3.5–5.1)
SODIUM: 140 mmol/L (ref 135–145)

## 2016-02-15 LAB — CBC
HCT: 42.6 % (ref 39.0–52.0)
HCT: 46.3 % (ref 39.0–52.0)
HEMOGLOBIN: 16.4 g/dL (ref 13.0–17.0)
Hemoglobin: 14.3 g/dL (ref 13.0–17.0)
MCH: 32 pg (ref 26.0–34.0)
MCH: 33.4 pg (ref 26.0–34.0)
MCHC: 33.6 g/dL (ref 30.0–36.0)
MCHC: 35.4 g/dL (ref 30.0–36.0)
MCV: 94.3 fL (ref 78.0–100.0)
MCV: 95.3 fL (ref 78.0–100.0)
PLATELETS: 202 10*3/uL (ref 150–400)
PLATELETS: 250 10*3/uL (ref 150–400)
RBC: 4.47 MIL/uL (ref 4.22–5.81)
RBC: 4.91 MIL/uL (ref 4.22–5.81)
RDW: 12.6 % (ref 11.5–15.5)
RDW: 12.8 % (ref 11.5–15.5)
WBC: 14.4 10*3/uL — ABNORMAL HIGH (ref 4.0–10.5)
WBC: 23.8 10*3/uL — ABNORMAL HIGH (ref 4.0–10.5)

## 2016-02-15 LAB — GLUCOSE, CAPILLARY: GLUCOSE-CAPILLARY: 99 mg/dL (ref 65–99)

## 2016-02-15 LAB — PREPARE FRESH FROZEN PLASMA
UNIT DIVISION: 0
Unit division: 0

## 2016-02-15 LAB — I-STAT ARTERIAL BLOOD GAS, ED
Acid-base deficit: 8 mmol/L — ABNORMAL HIGH (ref 0.0–2.0)
Bicarbonate: 20.5 mEq/L (ref 20.0–24.0)
O2 Saturation: 99 %
PCO2 ART: 53.3 mmHg — AB (ref 35.0–45.0)
PH ART: 7.192 — AB (ref 7.350–7.450)
TCO2: 22 mmol/L (ref 0–100)
pO2, Arterial: 194 mmHg — ABNORMAL HIGH (ref 80.0–100.0)

## 2016-02-15 LAB — COMPREHENSIVE METABOLIC PANEL
ALK PHOS: 56 U/L (ref 38–126)
ALT: 36 U/L (ref 17–63)
ANION GAP: 16 — AB (ref 5–15)
AST: 38 U/L (ref 15–41)
Albumin: 4.2 g/dL (ref 3.5–5.0)
BILIRUBIN TOTAL: 0.7 mg/dL (ref 0.3–1.2)
BUN: 13 mg/dL (ref 6–20)
CALCIUM: 8.9 mg/dL (ref 8.9–10.3)
CO2: 19 mmol/L — ABNORMAL LOW (ref 22–32)
CREATININE: 1.62 mg/dL — AB (ref 0.61–1.24)
Chloride: 105 mmol/L (ref 101–111)
GFR, EST AFRICAN AMERICAN: 59 mL/min — AB (ref >60)
GFR, EST NON AFRICAN AMERICAN: 51 mL/min — AB (ref >60)
Glucose, Bld: 137 mg/dL — ABNORMAL HIGH (ref 65–99)
Potassium: 3.6 mmol/L (ref 3.5–5.1)
Sodium: 140 mmol/L (ref 135–145)
TOTAL PROTEIN: 6.6 g/dL (ref 6.5–8.1)

## 2016-02-15 LAB — PROTIME-INR
INR: 1.09 (ref 0.00–1.49)
INR: 1.09 (ref 0.00–1.49)
PROTHROMBIN TIME: 14.3 s (ref 11.6–15.2)
Prothrombin Time: 14.3 seconds (ref 11.6–15.2)

## 2016-02-15 LAB — RAPID URINE DRUG SCREEN, HOSP PERFORMED
AMPHETAMINES: NOT DETECTED
Barbiturates: NOT DETECTED
Benzodiazepines: POSITIVE — AB
COCAINE: NOT DETECTED
OPIATES: NOT DETECTED
Tetrahydrocannabinol: NOT DETECTED

## 2016-02-15 LAB — TRIGLYCERIDES: Triglycerides: 329 mg/dL — ABNORMAL HIGH (ref <150)

## 2016-02-15 LAB — ABO/RH: ABO/RH(D): A POS

## 2016-02-15 LAB — ETHANOL: ALCOHOL ETHYL (B): 120 mg/dL — AB (ref <5)

## 2016-02-15 LAB — MRSA PCR SCREENING: MRSA BY PCR: NEGATIVE

## 2016-02-15 LAB — CDS SEROLOGY

## 2016-02-15 LAB — BLOOD PRODUCT ORDER (VERBAL) VERIFICATION

## 2016-02-15 MED ORDER — CEFAZOLIN SODIUM-DEXTROSE 2-4 GM/100ML-% IV SOLN
INTRAVENOUS | Status: AC
  Filled 2016-02-15: qty 100

## 2016-02-15 MED ORDER — PIVOT 1.5 CAL PO LIQD: 1000.0000 mL | ORAL | Status: DC

## 2016-02-15 MED ORDER — CHLORHEXIDINE GLUCONATE 0.12% ORAL RINSE (MEDLINE KIT)
15.0000 mL | Freq: Two times a day (BID) | OROMUCOSAL | Status: DC
  Administered 2016-02-15 – 2016-03-03 (×35): 15 mL via OROMUCOSAL

## 2016-02-15 MED ORDER — KCL IN DEXTROSE-NACL 20-5-0.45 MEQ/L-%-% IV SOLN
INTRAVENOUS | Status: DC
  Administered 2016-02-15 – 2016-02-16 (×3): 125 mL/h via INTRAVENOUS
  Administered 2016-02-16: 12:00:00 via INTRAVENOUS
  Administered 2016-02-16 – 2016-02-17 (×2): 125 mL/h via INTRAVENOUS
  Administered 2016-02-19: 15:00:00 via INTRAVENOUS
  Administered 2016-02-19 – 2016-02-20 (×3): 125 mL/h via INTRAVENOUS
  Filled 2016-02-15 (×25): qty 1000

## 2016-02-15 MED ORDER — PROPOFOL 1000 MG/100ML IV EMUL
0.0000 ug/kg/min | INTRAVENOUS | Status: DC
  Administered 2016-02-15: 25.952 ug/kg/min via INTRAVENOUS
  Administered 2016-02-15: 30 ug/kg/min via INTRAVENOUS
  Administered 2016-02-15: 50 ug/kg/min via INTRAVENOUS
  Administered 2016-02-15 (×2): 30 ug/kg/min via INTRAVENOUS
  Administered 2016-02-16: 40 ug/kg/min via INTRAVENOUS
  Administered 2016-02-16: 45 ug/kg/min via INTRAVENOUS
  Administered 2016-02-16: 30 ug/kg/min via INTRAVENOUS
  Administered 2016-02-16: 40 ug/kg/min via INTRAVENOUS
  Administered 2016-02-16 (×2): 30 ug/kg/min via INTRAVENOUS
  Administered 2016-02-17: 50 ug/kg/min via INTRAVENOUS
  Administered 2016-02-17 (×2): 40 ug/kg/min via INTRAVENOUS
  Administered 2016-02-18 (×4): 50 ug/kg/min via INTRAVENOUS
  Administered 2016-02-18: 40 ug/kg/min via INTRAVENOUS
  Administered 2016-02-18 – 2016-02-19 (×3): 50 ug/kg/min via INTRAVENOUS
  Administered 2016-02-19: 40 ug/kg/min via INTRAVENOUS
  Administered 2016-02-19: 50 ug/kg/min via INTRAVENOUS
  Administered 2016-02-19 (×3): 45 ug/kg/min via INTRAVENOUS
  Administered 2016-02-19 (×2): 50 ug/kg/min via INTRAVENOUS
  Administered 2016-02-20: 40 ug/kg/min via INTRAVENOUS
  Administered 2016-02-20 (×2): 50 ug/kg/min via INTRAVENOUS
  Administered 2016-02-20: 40 ug/kg/min via INTRAVENOUS
  Administered 2016-02-20: 50 ug/kg/min via INTRAVENOUS
  Administered 2016-02-20: 40 ug/kg/min via INTRAVENOUS
  Administered 2016-02-20 – 2016-02-22 (×12): 50 ug/kg/min via INTRAVENOUS
  Administered 2016-02-22: 20 ug/kg/min via INTRAVENOUS
  Filled 2016-02-15 (×9): qty 100
  Filled 2016-02-15: qty 200
  Filled 2016-02-15 (×52): qty 100

## 2016-02-15 MED ORDER — PIVOT 1.5 CAL PO LIQD
1000.0000 mL | ORAL | Status: DC
  Administered 2016-02-15 – 2016-02-19 (×4): 1000 mL

## 2016-02-15 MED ORDER — ETOMIDATE 2 MG/ML IV SOLN
INTRAVENOUS | Status: DC | PRN
  Administered 2016-02-15: 30 mg via INTRAVENOUS

## 2016-02-15 MED ORDER — FENTANYL BOLUS VIA INFUSION
50.0000 ug | INTRAVENOUS | Status: DC | PRN
  Administered 2016-02-22 (×2): 50 ug via INTRAVENOUS
  Filled 2016-02-15: qty 50

## 2016-02-15 MED ORDER — FENTANYL CITRATE (PF) 100 MCG/2ML IJ SOLN
INTRAMUSCULAR | Status: AC
  Filled 2016-02-15: qty 4

## 2016-02-15 MED ORDER — ANTISEPTIC ORAL RINSE SOLUTION (CORINZ)
7.0000 mL | OROMUCOSAL | Status: DC
  Administered 2016-02-15 – 2016-03-02 (×148): 7 mL via OROMUCOSAL

## 2016-02-15 MED ORDER — SELENIUM 50 MCG PO TABS
200.0000 ug | ORAL_TABLET | Freq: Every day | ORAL | Status: AC
  Administered 2016-02-15 – 2016-02-21 (×5): 200 ug
  Filled 2016-02-15 (×7): qty 4

## 2016-02-15 MED ORDER — ENOXAPARIN SODIUM 40 MG/0.4ML ~~LOC~~ SOLN
40.0000 mg | SUBCUTANEOUS | Status: AC
Start: 2016-02-15 — End: 2016-02-19
  Administered 2016-02-15 – 2016-02-19 (×5): 40 mg via SUBCUTANEOUS
  Filled 2016-02-15 (×5): qty 0.4

## 2016-02-15 MED ORDER — FENTANYL CITRATE (PF) 100 MCG/2ML IJ SOLN: 50.0000 ug | Freq: Once | INTRAMUSCULAR | Status: DC

## 2016-02-15 MED ORDER — CEFAZOLIN SODIUM-DEXTROSE 2-4 GM/100ML-% IV SOLN
2.0000 g | Freq: Three times a day (TID) | INTRAVENOUS | Status: DC
  Administered 2016-02-15: 2 g via INTRAVENOUS
  Filled 2016-02-15 (×2): qty 100

## 2016-02-15 MED ORDER — MIDAZOLAM HCL 2 MG/2ML IJ SOLN
INTRAMUSCULAR | Status: AC
  Filled 2016-02-15: qty 4

## 2016-02-15 MED ORDER — DOCUSATE SODIUM 50 MG/5ML PO LIQD
100.0000 mg | Freq: Two times a day (BID) | ORAL | Status: DC | PRN
  Filled 2016-02-15: qty 10

## 2016-02-15 MED ORDER — SODIUM CHLORIDE 0.9 % IV SOLN
25.0000 ug/h | INTRAVENOUS | Status: DC
  Administered 2016-02-15: 300 ug/h via INTRAVENOUS
  Administered 2016-02-15: 50 ug/h via INTRAVENOUS
  Administered 2016-02-16: 300 ug/h via INTRAVENOUS
  Administered 2016-02-16: 350 ug/h via INTRAVENOUS
  Administered 2016-02-16: 300 ug/h via INTRAVENOUS
  Administered 2016-02-16: 350 ug/h via INTRAVENOUS
  Administered 2016-02-17: 400 ug/h via INTRAVENOUS
  Administered 2016-02-17: 350 ug/h via INTRAVENOUS
  Administered 2016-02-18 – 2016-02-20 (×7): 400 ug/h via INTRAVENOUS
  Administered 2016-02-20: 50 ug/h via INTRAVENOUS
  Administered 2016-02-20 – 2016-02-22 (×6): 400 ug/h via INTRAVENOUS
  Filled 2016-02-15 (×29): qty 50

## 2016-02-15 MED ORDER — PROPOFOL 1000 MG/100ML IV EMUL
5.0000 ug/kg/min | INTRAVENOUS | Status: DC
  Administered 2016-02-15: 20 ug/kg/min via INTRAVENOUS

## 2016-02-15 MED ORDER — VITAMIN C 500 MG PO TABS
1000.0000 mg | ORAL_TABLET | Freq: Three times a day (TID) | ORAL | Status: AC
  Administered 2016-02-15 – 2016-02-22 (×19): 1000 mg
  Filled 2016-02-15 (×21): qty 2

## 2016-02-15 MED ORDER — SUCCINYLCHOLINE CHLORIDE 20 MG/ML IJ SOLN
INTRAMUSCULAR | Status: DC | PRN
  Administered 2016-02-15: 120 mg via INTRAVENOUS

## 2016-02-15 MED ORDER — SODIUM CHLORIDE 0.9 % IV SOLN
INTRAVENOUS | Status: DC | PRN
  Administered 2016-02-20: 10 mL/h via INTRA_ARTERIAL
  Administered 2016-02-22: 19:00:00 via INTRA_ARTERIAL

## 2016-02-15 MED ORDER — PROPOFOL 1000 MG/100ML IV EMUL
INTRAVENOUS | Status: AC
  Filled 2016-02-15: qty 100

## 2016-02-15 MED ORDER — CEFAZOLIN SODIUM 1-5 GM-% IV SOLN
1.0000 g | Freq: Four times a day (QID) | INTRAVENOUS | Status: DC
  Administered 2016-02-15 – 2016-02-19 (×15): 1 g via INTRAVENOUS
  Filled 2016-02-15 (×22): qty 50

## 2016-02-15 MED ORDER — PANTOPRAZOLE SODIUM 40 MG IV SOLR
40.0000 mg | INTRAVENOUS | Status: DC
  Administered 2016-02-15 – 2016-03-04 (×19): 40 mg via INTRAVENOUS
  Filled 2016-02-15 (×19): qty 40

## 2016-02-15 MED ORDER — BISACODYL 10 MG RE SUPP: 10.0000 mg | Freq: Every day | RECTAL | Status: DC | PRN
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
## 2016-02-15 NOTE — ED Notes (Signed)
Chest tube inserted by Dr Hulen Skains.
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
## 2016-02-15 NOTE — ED Notes (Signed)
sats dropped to 83% on nonrebreather, RT called, portable xray called. Dr Leonides Schanz and Dr Hulen Skains at bedside.
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
## 2016-02-15 NOTE — Progress Notes (Signed)
Pt intubated by EDP. 7.5 ETT secured at 22 at teeth by tube holder. Positive color change. BBS equal. PRVC 550/16/100/5. Will cont to monitor
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
## 2016-02-15 NOTE — ED Notes (Signed)
Took pt to CT with Dr Hulen Skains and Yvone Neu EMT. Pt became upset and combative. Chest tube was dislodged while moving pt to CT bed. Sats went to 79%, returned to Trauma C for intubation and chest tube replacement.
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
## 2016-02-15 NOTE — Progress Notes (Signed)
ABG panic notified to Dr.Wyatt. Increased patients RR to 18 and VT=600

## 2016-02-15 NOTE — Procedures (Signed)
Arterial Catheter Insertion Procedure Note Samory Izer [id number] 1976-09-01  Procedure: Insertion of Arterial Catheter  Indications: Blood pressure monitoring and Frequent blood sampling  Procedure Details Consent: Risks of procedure as well as the alternatives and risks of each were explained to the (patient/caregiver).  Consent for procedure obtained. Time Out: Verified patient identification, verified procedure, site/side was marked, verified correct patient position, special equipment/implants available, medications/allergies/relevent history reviewed, required imaging and test results available.  Performed  Maximum sterile technique was used including antiseptics, cap, gloves, gown, hand hygiene, mask and sheet. Skin prep: Chlorhexidine; local anesthetic administered 20 gauge catheter was inserted into right radial artery using the Seldinger technique.  Evaluation Blood flow good; BP tracing good. Complications: No apparent complications.  Arterial Line placed in RR under sterile technique per MD order. Pt stable throughout with no complications. Good flow noted as well as good waveform on monitor. RT will continue to monitor.   Jesse Sans 02/15/2016
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
## 2016-02-15 NOTE — ED Notes (Signed)
Pt intubated by Dr Leonides Schanz, positive CO2 color change.

## 2016-02-15 NOTE — ED Notes (Signed)
Oral airway placed by Dr Hulen Skains.

## 2016-02-16 ENCOUNTER — Inpatient Hospital Stay: Admit: 2016-02-16 | Payer: 59 | Admitting: Otolaryngology

## 2016-02-16 ENCOUNTER — Other Ambulatory Visit: Payer: Self-pay | Admitting: Otolaryngology

## 2016-02-16 ENCOUNTER — Encounter (HOSPITAL_COMMUNITY): Admission: EM | Disposition: A | Discharge status: Home / Self Care | Payer: Self-pay | Source: Home / Self Care

## 2016-02-16 ENCOUNTER — Inpatient Hospital Stay (HOSPITAL_COMMUNITY): Payer: Medicaid Other | Admitting: Anesthesiology

## 2016-02-16 ENCOUNTER — Encounter (HOSPITAL_COMMUNITY): Payer: Self-pay | Admitting: Certified Registered Nurse Anesthetist

## 2016-02-16 ENCOUNTER — Inpatient Hospital Stay (HOSPITAL_COMMUNITY): Payer: Medicaid Other

## 2016-02-16 DIAGNOSIS — J9601 Acute respiratory failure with hypoxia: Secondary | ICD-10-CM

## 2016-02-16 DIAGNOSIS — S225XXD Flail chest, subsequent encounter for fracture with routine healing: Secondary | ICD-10-CM

## 2016-02-16 HISTORY — PX: ORIF MANDIBULAR FRACTURE: SHX2127

## 2016-02-16 HISTORY — PX: TRACHEOSTOMY TUBE PLACEMENT: SHX814

## 2016-02-16 LAB — BLOOD GAS, ARTERIAL
Acid-Base Excess: 0.1 mmol/L (ref 0.0–2.0)
BICARBONATE: 26.1 meq/L — AB (ref 20.0–24.0)
Drawn by: 406621
FIO2: 0.5
MECHVT: 600 mL
O2 SAT: 90 %
PATIENT TEMPERATURE: 99.2
PCO2 ART: 58.5 mmHg — AB (ref 35.0–45.0)
PEEP: 8 cmH2O
PH ART: 7.275 — AB (ref 7.350–7.450)
PO2 ART: 61.1 mmHg — AB (ref 80.0–100.0)
RATE: 20 resp/min
TCO2: 27.9 mmol/L (ref 0–100)

## 2016-02-16 LAB — CBC
HCT: 36.5 % — ABNORMAL LOW (ref 39.0–52.0)
Hemoglobin: 12 g/dL — ABNORMAL LOW (ref 13.0–17.0)
MCH: 31.7 pg (ref 26.0–34.0)
MCHC: 32.9 g/dL (ref 30.0–36.0)
MCV: 96.3 fL (ref 78.0–100.0)
PLATELETS: 152 10*3/uL (ref 150–400)
RBC: 3.79 MIL/uL — AB (ref 4.22–5.81)
RDW: 12.7 % (ref 11.5–15.5)
WBC: 9.8 10*3/uL (ref 4.0–10.5)

## 2016-02-16 LAB — BASIC METABOLIC PANEL
ANION GAP: 7 (ref 5–15)
BUN: 14 mg/dL (ref 6–20)
CO2: 24 mmol/L (ref 22–32)
Calcium: 8.1 mg/dL — ABNORMAL LOW (ref 8.9–10.3)
Chloride: 106 mmol/L (ref 101–111)
Creatinine, Ser: 1.1 mg/dL (ref 0.61–1.24)
GFR calc Af Amer: >60 mL/min (ref >60)
Glucose, Bld: 130 mg/dL — ABNORMAL HIGH (ref 65–99)
POTASSIUM: 4.7 mmol/L (ref 3.5–5.1)
SODIUM: 137 mmol/L (ref 135–145)

## 2016-02-16 LAB — POCT I-STAT 3, ART BLOOD GAS (G3+)
ACID-BASE DEFICIT: 3 mmol/L — AB (ref 0.0–2.0)
BICARBONATE: 25.2 meq/L — AB (ref 20.0–24.0)
O2 SAT: 86 %
TCO2: 27 mmol/L (ref 0–100)
pCO2 arterial: 57.8 mmHg (ref 35.0–45.0)
pH, Arterial: 7.252 — ABNORMAL LOW (ref 7.350–7.450)
pO2, Arterial: 63 mmHg — ABNORMAL LOW (ref 80.0–100.0)

## 2016-02-16 LAB — GLUCOSE, CAPILLARY
GLUCOSE-CAPILLARY: 116 mg/dL — AB (ref 65–99)
GLUCOSE-CAPILLARY: 108 mg/dL — AB (ref 65–99)
Glucose-Capillary: 108 mg/dL — ABNORMAL HIGH (ref 65–99)
Glucose-Capillary: 117 mg/dL — ABNORMAL HIGH (ref 65–99)
Glucose-Capillary: 118 mg/dL — ABNORMAL HIGH (ref 65–99)

## 2016-02-16 SURGERY — OPEN REDUCTION INTERNAL FIXATION (ORIF) MANDIBULAR FRACTURE
Anesthesia: General | Site: Neck

## 2016-02-16 SURGERY — OPEN REDUCTION INTERNAL FIXATION (ORIF) MANDIBULAR FRACTURE
Anesthesia: General

## 2016-02-16 MED ORDER — LACTATED RINGERS IV SOLN
INTRAVENOUS | Status: DC | PRN
  Administered 2016-02-16: 12:00:00 via INTRAVENOUS

## 2016-02-16 MED ORDER — CLINDAMYCIN PHOSPHATE 600 MG/50ML IV SOLN
600.0000 mg | Freq: Four times a day (QID) | INTRAVENOUS | Status: DC
  Administered 2016-02-16 – 2016-02-20 (×15): 600 mg via INTRAVENOUS
  Filled 2016-02-16 (×17): qty 50

## 2016-02-16 MED ORDER — MIDAZOLAM HCL 2 MG/2ML IJ SOLN
INTRAMUSCULAR | Status: AC
  Filled 2016-02-16: qty 2

## 2016-02-16 MED ORDER — LIDOCAINE-EPINEPHRINE 1 %-1:100000 IJ SOLN
INTRAMUSCULAR | Status: DC | PRN
  Administered 2016-02-16: 2 mL

## 2016-02-16 MED ORDER — FENTANYL CITRATE (PF) 250 MCG/5ML IJ SOLN
INTRAMUSCULAR | Status: AC
  Filled 2016-02-16: qty 5

## 2016-02-16 MED ORDER — ROCURONIUM BROMIDE 50 MG/5ML IV SOLN
INTRAVENOUS | Status: AC
Start: 2016-02-16 — End: 2016-02-16
  Filled 2016-02-16: qty 3

## 2016-02-16 MED ORDER — MIDAZOLAM HCL 5 MG/5ML IJ SOLN
INTRAMUSCULAR | Status: DC | PRN
  Administered 2016-02-16 (×2): 2 mg via INTRAVENOUS

## 2016-02-16 MED ORDER — ROCURONIUM BROMIDE 100 MG/10ML IV SOLN
INTRAVENOUS | Status: DC | PRN
  Administered 2016-02-16: 40 mg via INTRAVENOUS
  Administered 2016-02-16 (×2): 50 mg via INTRAVENOUS

## 2016-02-16 MED ORDER — 0.9 % SODIUM CHLORIDE (POUR BTL) OPTIME
TOPICAL | Status: DC | PRN
  Administered 2016-02-16: 1000 mL

## 2016-02-16 MED ORDER — FENTANYL CITRATE (PF) 100 MCG/2ML IJ SOLN
INTRAMUSCULAR | Status: DC | PRN
  Administered 2016-02-16: 100 ug via INTRAVENOUS
  Administered 2016-02-16 (×3): 50 ug via INTRAVENOUS
  Administered 2016-02-16 (×2): 100 ug via INTRAVENOUS
  Administered 2016-02-16: 50 ug via INTRAVENOUS

## 2016-02-16 MED ORDER — ROCURONIUM BROMIDE 50 MG/5ML IV SOLN
INTRAVENOUS | Status: AC
  Filled 2016-02-16: qty 1

## 2016-02-16 MED ORDER — PROPOFOL 10 MG/ML IV BOLUS
INTRAVENOUS | Status: AC
  Filled 2016-02-16: qty 20

## 2016-02-16 MED ORDER — LIDOCAINE-EPINEPHRINE 1 %-1:100000 IJ SOLN
INTRAMUSCULAR | Status: AC
  Filled 2016-02-16: qty 1

## 2016-02-16 SURGICAL SUPPLY — 60 items
BLADE 10 SAFETY STRL DISP (BLADE) IMPLANT
BLADE SURG 15 STRL LF DISP TIS (BLADE) ×2 IMPLANT
BLADE SURG 15 STRL SS (BLADE) ×2
BLADE SURG ROTATE 9660 (MISCELLANEOUS) IMPLANT
CANISTER SUCTION 2500CC (MISCELLANEOUS) ×4 IMPLANT
CLEANER TIP ELECTROSURG 2X2 (MISCELLANEOUS) ×4 IMPLANT
CLOSURE WOUND 1/2 X4 (GAUZE/BANDAGES/DRESSINGS)
CONFORMERS SILICONE 5649 (OPHTHALMIC RELATED) IMPLANT
COVER SURGICAL LIGHT HANDLE (MISCELLANEOUS) ×8 IMPLANT
CRADLE DONUT ADULT HEAD (MISCELLANEOUS) ×4 IMPLANT
DECANTER SPIKE VIAL GLASS SM (MISCELLANEOUS) ×4 IMPLANT
DRAPE PROXIMA HALF (DRAPES) IMPLANT
ELECT COATED BLADE 2.86 ST (ELECTRODE) ×4 IMPLANT
ELECT NEEDLE BLADE 2-5/6 (NEEDLE) ×4 IMPLANT
ELECT REM PT RETURN 9FT ADLT (ELECTROSURGICAL) ×4
ELECTRODE REM PT RTRN 9FT ADLT (ELECTROSURGICAL) ×2 IMPLANT
GAUZE SPONGE 4X4 16PLY XRAY LF (GAUZE/BANDAGES/DRESSINGS) ×4 IMPLANT
GLOVE BIOGEL PI IND STRL 8 (GLOVE) ×2 IMPLANT
GLOVE BIOGEL PI INDICATOR 8 (GLOVE) ×2
GLOVE ECLIPSE 7.5 STRL STRAW (GLOVE) ×8 IMPLANT
GLOVE SURG SS PI 8.0 STRL IVOR (GLOVE) ×8 IMPLANT
GOWN STRL REUS W/ TWL LRG LVL3 (GOWN DISPOSABLE) ×4 IMPLANT
GOWN STRL REUS W/TWL LRG LVL3 (GOWN DISPOSABLE) ×4
KIT BASIN OR (CUSTOM PROCEDURE TRAY) ×4 IMPLANT
KIT ROOM TURNOVER OR (KITS) ×4 IMPLANT
NEEDLE HYPO 25GX1X1/2 BEV (NEEDLE) ×4 IMPLANT
NS IRRIG 1000ML POUR BTL (IV SOLUTION) ×4 IMPLANT
PAD ARMBOARD 7.5X6 YLW CONV (MISCELLANEOUS) ×8 IMPLANT
PATTIES SURGICAL .5 X3 (DISPOSABLE) ×4 IMPLANT
PENCIL FOOT CONTROL (ELECTRODE) ×4 IMPLANT
PLATE 4 H FRACTURE C SHAPE (Plate) ×4 IMPLANT
PLATE MID FACE 4H CURVED (Plate) ×4 IMPLANT
SCREW MIDFACE 1.7X4M SLF TAP (Orthopedic Implant) ×16 IMPLANT
SCREW MNDBLE 2.0X10 LOCKING (Screw) ×4 IMPLANT
SCREW MNDBLE 2.0X8 LOCKING (Screw) ×4 IMPLANT
SCREW UPPER FACE 2.0X12MM (Screw) ×20 IMPLANT
SPONGE DRAIN TRACH 4X4 STRL 2S (GAUZE/BANDAGES/DRESSINGS) ×4 IMPLANT
STRIP CLOSURE SKIN 1/2X4 (GAUZE/BANDAGES/DRESSINGS) IMPLANT
SURGILUBE 2OZ TUBE FLIPTOP (MISCELLANEOUS) ×4 IMPLANT
SUT CHROMIC 3 0 PS 2 (SUTURE) ×4 IMPLANT
SUT CHROMIC 4 0 PS 2 18 (SUTURE) ×4 IMPLANT
SUT CHROMIC GUT 2 0 PS 2 27 (SUTURE) ×4 IMPLANT
SUT ETHILON 3 0 PS 1 (SUTURE) ×4 IMPLANT
SUT ETHILON 5 0 P 3 18 (SUTURE) ×2
SUT NYLON ETHILON 5-0 P-3 1X18 (SUTURE) ×2 IMPLANT
SUT SILK 2 0 FS (SUTURE) IMPLANT
SUT SILK 3 0 SH 30 (SUTURE) ×4 IMPLANT
SUT SILK 3 0 TIES 17X18 (SUTURE) ×2
SUT SILK 3-0 18XBRD TIE BLK (SUTURE) ×2 IMPLANT
SUT STEEL 0 (SUTURE)
SUT STEEL 0 18XMFL TIE 17 (SUTURE) IMPLANT
SUT STEEL 2 (SUTURE) ×4 IMPLANT
SUT STEEL 4 (SUTURE) ×4 IMPLANT
SYR CONTROL 10ML LL (SYRINGE) ×4 IMPLANT
TOWEL OR 17X24 6PK STRL BLUE (TOWEL DISPOSABLE) ×4 IMPLANT
TRAY ENT MC OR (CUSTOM PROCEDURE TRAY) ×4 IMPLANT
TUBE CONNECTING 12'X1/4 (SUCTIONS) ×2
TUBE CONNECTING 12X1/4 (SUCTIONS) ×6 IMPLANT
TUBE TRACH SHILEY  6 DIST  CUF (TUBING) ×4 IMPLANT
WATER STERILE IRR 1000ML POUR (IV SOLUTION) ×4 IMPLANT

## 2016-02-16 NOTE — Anesthesia Postprocedure Evaluation (Signed)
Anesthesia Post Note  Patient: [name]  Procedure(s) Performed: Procedure(s) (LRB): OPEN REDUCTION INTERNAL FIXATION (ORIF) MANDIBULAR FRACTURE (N/A) TRACHEOSTOMY (N/A)  Patient location during evaluation: SICU Anesthesia Type: General Level of consciousness: sedated Pain management: pain level controlled Vital Signs Assessment: post-procedure vital signs reviewed and stable Respiratory status: patient remains intubated per anesthesia plan Cardiovascular status: stable Anesthetic complications: no    Last Vitals:  Filed Vitals:   02/16/16 1700 02/16/16 1800  BP: 107/58 112/54  Pulse: 84 84  Temp:    Resp: 24 24    Last Pain:  Filed Vitals:   02/16/16 1821  PainSc: 2                  Zenaida Deed
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
## 2016-02-16 NOTE — OR Nursing (Signed)
Obturator and Wire Scissors, have been taped to the head of Pt. Bed.
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
## 2016-02-16 NOTE — Progress Notes (Signed)
Pt to OR with CRNA manually ventilating.

## 2016-02-16 NOTE — Transfer of Care (Addendum)
Immediate Anesthesia Transfer of Care Note  Patient: Ananda Petrosyan  Procedure(s) Performed: Procedure(s): OPEN REDUCTION INTERNAL FIXATION (ORIF) MANDIBULAR FRACTURE (N/A) TRACHEOSTOMY (N/A)  Patient Location: SICU  Anesthesia Type:General  Level of Consciousness: Patient remains intubated per anesthesia plan  Airway & Oxygen Therapy: Patient remains intubated per anesthesia plan and Patient placed on Ventilator (see vital sign flow sheet for setting)  Post-op Assessment: Report given to RN and Post -op Vital signs reviewed and stable  Post vital signs: Reviewed and stable  Last Vitals:  Filed Vitals:   02/16/16 1000 02/16/16 1100  BP: 109/69 105/64  Pulse: 81 82  Temp:    Resp: 18 18    Last Pain:  Filed Vitals:   02/16/16 1140  PainSc: 2          Complications: No apparent anesthesia complications

## 2016-02-16 NOTE — Progress Notes (Signed)
TF's turned off @ this time in preparation for surgery 4/28.
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
## 2016-02-17 ENCOUNTER — Inpatient Hospital Stay (HOSPITAL_COMMUNITY): Payer: Medicaid Other

## 2016-02-17 DIAGNOSIS — S225XXD Flail chest, subsequent encounter for fracture with routine healing: Secondary | ICD-10-CM

## 2016-02-17 DIAGNOSIS — J9601 Acute respiratory failure with hypoxia: Secondary | ICD-10-CM

## 2016-02-17 LAB — BASIC METABOLIC PANEL
Anion gap: 5 (ref 5–15)
BUN: 8 mg/dL (ref 6–20)
CHLORIDE: 104 mmol/L (ref 101–111)
CO2: 27 mmol/L (ref 22–32)
CREATININE: 1.04 mg/dL (ref 0.61–1.24)
Calcium: 8 mg/dL — ABNORMAL LOW (ref 8.9–10.3)
GFR calc Af Amer: >60 mL/min (ref >60)
GFR calc non Af Amer: >60 mL/min (ref >60)
GLUCOSE: 134 mg/dL — AB (ref 65–99)
POTASSIUM: 4.8 mmol/L (ref 3.5–5.1)
SODIUM: 136 mmol/L (ref 135–145)

## 2016-02-17 LAB — POCT I-STAT 3, ART BLOOD GAS (G3+)
ACID-BASE DEFICIT: 1 mmol/L (ref 0.0–2.0)
ACID-BASE DEFICIT: 1 mmol/L (ref 0.0–2.0)
BICARBONATE: 27 meq/L — AB (ref 20.0–24.0)
BICARBONATE: 26.8 meq/L — AB (ref 20.0–24.0)
O2 SAT: 95 %
O2 Saturation: 79 %
PCO2 ART: 67 mmHg — AB (ref 35.0–45.0)
PH ART: 7.221 — AB (ref 7.350–7.450)
PH ART: 7.248 — AB (ref 7.350–7.450)
PO2 ART: 58 mmHg — AB (ref 80.0–100.0)
PO2 ART: 96 mmHg (ref 80.0–100.0)
Patient temperature: 38.4
Patient temperature: 38.6
TCO2: 29 mmol/L (ref 0–100)
TCO2: 29 mmol/L (ref 0–100)
pCO2 arterial: 62.5 mmHg (ref 35.0–45.0)

## 2016-02-17 LAB — GLUCOSE, CAPILLARY
GLUCOSE-CAPILLARY: 128 mg/dL — AB (ref 65–99)
Glucose-Capillary: 128 mg/dL — ABNORMAL HIGH (ref 65–99)

## 2016-02-17 LAB — BLOOD GAS, ARTERIAL
Acid-Base Excess: 1.2 mmol/L (ref 0.0–2.0)
Bicarbonate: 26.4 mEq/L — ABNORMAL HIGH (ref 20.0–24.0)
DRAWN BY: 406621
FIO2: 0.55
LHR: 24 {breaths}/min
MECHVT: 600 mL
O2 Saturation: 95.8 %
PCO2 ART: 50.1 mmHg — AB (ref 35.0–45.0)
PEEP: 8 cmH2O
PO2 ART: 77.6 mmHg — AB (ref 80.0–100.0)
Patient temperature: 98.6
TCO2: 27.9 mmol/L (ref 0–100)
pH, Arterial: 7.341 — ABNORMAL LOW (ref 7.350–7.450)

## 2016-02-17 LAB — CBC WITH DIFFERENTIAL/PLATELET
Basophils Absolute: 0 10*3/uL (ref 0.0–0.1)
Basophils Relative: 0 %
EOS ABS: 0.3 10*3/uL (ref 0.0–0.7)
EOS PCT: 3 %
HCT: 33.4 % — ABNORMAL LOW (ref 39.0–52.0)
Hemoglobin: 11 g/dL — ABNORMAL LOW (ref 13.0–17.0)
LYMPHS ABS: 0.9 10*3/uL (ref 0.7–4.0)
LYMPHS PCT: 9 %
MCH: 32.6 pg (ref 26.0–34.0)
MCHC: 32.9 g/dL (ref 30.0–36.0)
MCV: 99.1 fL (ref 78.0–100.0)
MONOS PCT: 9 %
Monocytes Absolute: 1 10*3/uL (ref 0.1–1.0)
Neutro Abs: 8 10*3/uL — ABNORMAL HIGH (ref 1.7–7.7)
Neutrophils Relative %: 79 %
PLATELETS: 153 10*3/uL (ref 150–400)
RBC: 3.37 MIL/uL — ABNORMAL LOW (ref 4.22–5.81)
RDW: 12.5 % (ref 11.5–15.5)
WBC: 10.1 10*3/uL (ref 4.0–10.5)

## 2016-02-17 LAB — TRIGLYCERIDES: Triglycerides: 295 mg/dL — ABNORMAL HIGH (ref <150)

## 2016-02-17 MED ORDER — SODIUM CHLORIDE 0.9% FLUSH
10.0000 mL | Freq: Two times a day (BID) | INTRAVENOUS | Status: DC
  Administered 2016-02-17 – 2016-03-02 (×21): 10 mL

## 2016-02-17 MED ORDER — SODIUM CHLORIDE 0.9% FLUSH
10.0000 mL | INTRAVENOUS | Status: DC | PRN
  Administered 2016-03-02: 20 mL
  Filled 2016-02-17: qty 40
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
## 2016-02-17 NOTE — Progress Notes (Signed)
Peripherally Inserted Central Catheter/Midline Placement  The IV Nurse has discussed with the patient and/or persons authorized to consent for the patient, the purpose of this procedure and the potential benefits and risks involved with this procedure.  The benefits include less needle sticks, lab draws from the catheter and patient may be discharged home with the catheter.  Risks include, but not limited to, infection, bleeding, blood clot (thrombus formation), and puncture of an artery; nerve damage and irregular heat beat.  Alternatives to this procedure were also discussed.  Staff RN's obtained consent via telephone. Wife at bedside prior to procedure, additional questions answered.  PICC/Midline Placement Documentation  PICC Triple Lumen [id number] PICC Right Basilic 43 cm 0 cm (Active)  Indication for Insertion or Continuance of Line Vasoactive infusions;Head or chest injuries (Tracheotomy, burns, open chest wounds);Prolonged intravenous therapies 02/17/2016 11:38 AM  Exposed Catheter (cm) 0 cm 02/17/2016 11:38 AM  Site Assessment Clean;Dry;Intact 02/17/2016 11:38 AM  Lumen #1 Status Flushed;Saline locked;Blood return noted 02/17/2016 11:38 AM  Lumen #2 Status Flushed;Saline locked;Blood return noted 02/17/2016 11:38 AM  Lumen #3 Status Flushed;Saline locked;Blood return noted 02/17/2016 11:38 AM  Dressing Type Transparent 02/17/2016 11:38 AM  Dressing Status Clean;Dry;Intact;Antimicrobial disc in place 02/17/2016 11:38 AM  Line Care Connections checked and tightened 02/17/2016 11:38 AM  Line Adjustment (NICU/IV Team Only) No 02/17/2016 11:38 AM  Dressing Intervention New dressing 02/17/2016 11:38 AM  Dressing Change Due 02/24/16 02/17/2016 11:38 AM       [name] 02/17/2016, 11:38 AM

## 2016-02-17 NOTE — Progress Notes (Signed)
Patient ID: [name], male   DOB: 1976/09/21, 40 y.o.   MRN: [id number] Follow up - Trauma and Critical Care  Patient Details:    [name] is an 40 y.o. male.  Lines/tubes : Arterial Line 02/15/16 Right Radial (Active)  Site Assessment Clean;Dry;Intact 02/17/2016  7:52 AM  Line Status Pulsatile blood flow 02/17/2016  7:52 AM  Art Line Waveform Appropriate 02/17/2016  7:52 AM  Art Line Interventions Zeroed and calibrated;Leveled;Connections checked and tightened;Flushed per protocol 02/17/2016  7:52 AM  Color/Movement/Sensation Capillary refill less than 3 sec 02/17/2016  7:52 AM  Dressing Type Transparent 02/17/2016  7:52 AM  Dressing Status Clean;Dry;Intact;Antimicrobial disc in place 02/17/2016  7:52 AM  Dressing Change Due 02/22/16 02/17/2016  7:52 AM     Chest Tube Left Pleural (Active)  Suction -20 cm H2O 02/17/2016  7:46 AM  Chest Tube Air Leak None 02/17/2016  7:46 AM  Patency Intervention Tip/tilt 02/17/2016  7:46 AM  Drainage Description Sanguineous;Serosanguineous 02/17/2016  7:46 AM  Dressing Status Clean;Intact;Dry 02/17/2016  7:46 AM  Dressing Intervention Dressing changed 02/15/2016  3:00 AM  Site Assessment Clean;Dry;Intact 02/17/2016  7:46 AM  Surrounding Skin Dry;Reddened 02/17/2016  7:46 AM  Output (mL) 0 mL 02/17/2016  7:00 AM     NG/OG Tube Nasogastric 18 Fr. Right nare (Active)  Placement Verification Auscultation 02/17/2016  7:46 AM  Site Assessment Clean;Dry;Intact 02/17/2016  7:46 AM  Status Infusing tube feed;Retaped 02/17/2016  7:46 AM  Amount of suction 45 mmHg 02/16/2016  2:20 PM  Drainage Appearance Bile 02/16/2016  2:20 PM  Intake (mL) 30 mL 02/17/2016  7:46 AM     Urethral Catheter ken emt  Temperature probe 18 Fr. (Active)  Indication for Insertion or Continuance of Catheter Unstable critical patients (first 24-48 hours);Unstable spinal/crush injuries;Peri-operative use for selective surgical procedure 02/17/2016  7:46 AM  Site Assessment Clean;Intact 02/17/2016   7:46 AM  Catheter Maintenance Bag below level of bladder;Catheter secured;Drainage bag/tubing not touching floor;Insertion date on drainage bag;No dependent loops;Seal intact 02/17/2016  7:46 AM  Collection Container Standard drainage bag 02/17/2016  7:46 AM  Securement Method Leg strap 02/17/2016  7:46 AM  Urinary Catheter Interventions Unclamped 02/17/2016  7:46 AM  Output (mL) 100 mL 02/17/2016  7:00 AM    Microbiology/Sepsis markers: Results for orders placed or performed during the hospital encounter of 02/14/16  MRSA PCR Screening     Status: None   Collection Time: 02/15/16  3:02 AM  Result Value Ref Range Status   MRSA by PCR NEGATIVE NEGATIVE Final    Comment:        The GeneXpert MRSA Assay (FDA approved for NASAL specimens only), is one component of a comprehensive MRSA colonization surveillance program. It is not intended to diagnose MRSA infection nor to guide or monitor treatment for MRSA infections.     Anti-infectives:  Anti-infectives    Start     Dose/Rate Route Frequency Ordered Stop   02/16/16 1500  clindamycin (CLEOCIN) IVPB 600 mg     600 mg 100 mL/hr over 30 Minutes Intravenous Every 6 hours 02/16/16 1420     02/15/16 0144  ceFAZolin (ANCEF) 2-4 GM/100ML-% IVPB    Comments:  Albright, Cassandra : cabinet override      02/15/16 0144 02/15/16 1359   02/15/16 0100  ceFAZolin (ANCEF) IVPB 1 g/50 mL premix     1 g 100 mL/hr over 30 Minutes Intravenous Every 6 hours 02/15/16 0049     02/15/16 0045  ceFAZolin (ANCEF) IVPB  2g/100 mL premix  Status:  Discontinued     2 g 200 mL/hr over 30 Minutes Intravenous Every 8 hours 02/15/16 0043 02/15/16 0236        Consults: Treatment Team:  Melissa Montane, MD Ivin Poot, MD    Events:  Subjective:    Overnight Issues: Sedated, on vent via trach  Objective:  Vital signs for last 24 hours: Temp:  [98.3 F (36.8 C)-101.5 F (38.6 C)] 101.5 F (38.6 C) (04/29 0600) Pulse Rate:  [80-91] 88 (04/29  0800) Resp:  [18-24] 24 (04/29 0800) BP: (97-134)/(53-69) 119/64 mmHg (04/29 0800) SpO2:  [93 %-100 %] 97 % (04/29 0800) Arterial Line BP: (117-153)/(46-63) 129/52 mmHg (04/29 0800) FiO2 (%):  [50 %-100 %] 90 % (04/29 0800) Weight:  [118.8 kg (261 lb 14.5 oz)] 118.8 kg (261 lb 14.5 oz) (04/29 0500)  Hemodynamic parameters for last 24 hours:    Intake/Output from previous day: 04/28 0701 - 04/29 0700 In: 5569.1 [I.V.:4798.8; NG/GT:420.3; IV Piggyback:350] Out: 1760 [Urine:1430; Blood:20; Chest Tube:310]  Intake/Output this shift: Total I/O In: 275.4 [I.V.:225.4; NG/GT:50] Out: -   Vent settings for last 24 hours: Vent Mode:  [-] PRVC FiO2 (%):  [50 %-100 %] 90 % Set Rate:  [20 bmp-24 bmp] 24 bmp Vt Set:  [500 mL-600 mL] 500 mL PEEP:  [5 cmH20-8 cmH20] 8 cmH20 Plateau Pressure:  [23 cmH20-34 cmH20] 23 cmH20  Physical Exam:  Trach site stable Lungs with coarse BS bilaterally CV tachy Abdomen soft, non distended  Results for orders placed or performed during the hospital encounter of 02/14/16 (from the past 24 hour(s))  Glucose, capillary     Status: Abnormal   Collection Time: 02/16/16  3:47 PM  Result Value Ref Range   Glucose-Capillary 116 (H) 65 - 99 mg/dL   Comment 1 Notify RN   Blood gas, arterial     Status: Abnormal   Collection Time: 02/16/16  4:00 PM  Result Value Ref Range   FIO2 0.50    Delivery systems VENTILATOR    Mode PRESSURE REGULATED VOLUME CONTROL    VT 600 mL   LHR 20 resp/min   Peep/cpap 8.0 cm H20   pH, Arterial 7.275 (L) 7.350 - 7.450   pCO2 arterial 58.5 (HH) 35.0 - 45.0 mmHg   pO2, Arterial 61.1 (L) 80.0 - 100.0 mmHg   Bicarbonate 26.1 (H) 20.0 - 24.0 mEq/L   TCO2 27.9 0 - 100 mmol/L   Acid-Base Excess 0.1 0.0 - 2.0 mmol/L   O2 Saturation 90.0 %   Patient temperature 99.2    Collection site A-LINE    Drawn by [id number]    Sample type ARTERIAL DRAW    Allens test (pass/fail) PASS PASS  Blood gas, arterial     Status: Abnormal    Collection Time: 02/16/16  6:40 PM  Result Value Ref Range   FIO2 0.55    Delivery systems VENTILATOR    Mode PRESSURE REGULATED VOLUME CONTROL    VT 600 mL   LHR 24 resp/min   Peep/cpap 8.0 cm H20   pH, Arterial 7.341 (L) 7.350 - 7.450   pCO2 arterial 50.1 (H) 35.0 - 45.0 mmHg   pO2, Arterial 77.6 (L) 80.0 - 100.0 mmHg   Bicarbonate 26.4 (H) 20.0 - 24.0 mEq/L   TCO2 27.9 0 - 100 mmol/L   Acid-Base Excess 1.2 0.0 - 2.0 mmol/L   O2 Saturation 95.8 %   Patient temperature 98.6    Collection site A-LINE  Drawn by [id number]    Sample type ARTERIAL DRAW    Allens test (pass/fail) PASS PASS  Glucose, capillary     Status: Abnormal   Collection Time: 02/16/16  7:37 PM  Result Value Ref Range   Glucose-Capillary 108 (H) 65 - 99 mg/dL   Comment 1 Capillary Specimen    Comment 2 Notify RN   Glucose, capillary     Status: Abnormal   Collection Time: 02/16/16 11:46 PM  Result Value Ref Range   Glucose-Capillary 128 (H) 65 - 99 mg/dL   Comment 1 Arterial Specimen    Comment 2 Notify RN   I-STAT 3, arterial blood gas (G3+)     Status: Abnormal   Collection Time: 02/17/16  3:24 AM  Result Value Ref Range   pH, Arterial 7.221 (L) 7.350 - 7.450   pCO2 arterial 67.0 (HH) 35.0 - 45.0 mmHg   pO2, Arterial 58.0 (L) 80.0 - 100.0 mmHg   Bicarbonate 27.0 (H) 20.0 - 24.0 mEq/L   TCO2 29 0 - 100 mmol/L   O2 Saturation 79.0 %   Acid-base deficit 1.0 0.0 - 2.0 mmol/L   Patient temperature 38.4 C    Collection site ARTERIAL LINE    Sample type ARTERIAL    Comment NOTIFIED PHYSICIAN   CBC with Differential/Platelet     Status: Abnormal   Collection Time: 02/17/16  3:33 AM  Result Value Ref Range   WBC 10.1 4.0 - 10.5 K/uL   RBC 3.37 (L) 4.22 - 5.81 MIL/uL   Hemoglobin 11.0 (L) 13.0 - 17.0 g/dL   HCT 33.4 (L) 39.0 - 52.0 %   MCV 99.1 78.0 - 100.0 fL   MCH 32.6 26.0 - 34.0 pg   MCHC 32.9 30.0 - 36.0 g/dL   RDW 12.5 11.5 - 15.5 %   Platelets 153 150 - 400 K/uL   Neutrophils Relative % 79 %    Neutro Abs 8.0 (H) 1.7 - 7.7 K/uL   Lymphocytes Relative 9 %   Lymphs Abs 0.9 0.7 - 4.0 K/uL   Monocytes Relative 9 %   Monocytes Absolute 1.0 0.1 - 1.0 K/uL   Eosinophils Relative 3 %   Eosinophils Absolute 0.3 0.0 - 0.7 K/uL   Basophils Relative 0 %   Basophils Absolute 0.0 0.0 - 0.1 K/uL  Basic metabolic panel     Status: Abnormal   Collection Time: 02/17/16  3:33 AM  Result Value Ref Range   Sodium 136 135 - 145 mmol/L   Potassium 4.8 3.5 - 5.1 mmol/L   Chloride 104 101 - 111 mmol/L   CO2 27 22 - 32 mmol/L   Glucose, Bld 134 (H) 65 - 99 mg/dL   BUN 8 6 - 20 mg/dL   Creatinine, Ser 1.04 0.61 - 1.24 mg/dL   Calcium 8.0 (L) 8.9 - 10.3 mg/dL   GFR calc non Af Amer >60 >60 mL/min   GFR calc Af Amer >60 >60 mL/min   Anion gap 5 5 - 15  Triglycerides     Status: Abnormal   Collection Time: 02/17/16  3:34 AM  Result Value Ref Range   Triglycerides 295 (H) <150 mg/dL  Glucose, capillary     Status: Abnormal   Collection Time: 02/17/16  3:55 AM  Result Value Ref Range   Glucose-Capillary 128 (H) 65 - 99 mg/dL   Comment 1 Capillary Specimen    Comment 2 Notify RN   I-STAT 3, arterial blood gas (G3+)     Status: Abnormal  Collection Time: 02/17/16  4:35 AM  Result Value Ref Range   pH, Arterial 7.248 (L) 7.350 - 7.450   pCO2 arterial 62.5 (HH) 35.0 - 45.0 mmHg   pO2, Arterial 96.0 80.0 - 100.0 mmHg   Bicarbonate 26.8 (H) 20.0 - 24.0 mEq/L   TCO2 29 0 - 100 mmol/L   O2 Saturation 95.0 %   Acid-base deficit 1.0 0.0 - 2.0 mmol/L   Patient temperature 38.6 C    Collection site ARTERIAL LINE    Sample type ARTERIAL    Comment NOTIFIED PHYSICIAN      Assessment/Plan:   NEURO  sedated   Plan: CPM  PULM  Vent per CCM     CARDIO       RENAL  Maintaining UOP CR stable     GI  Tube feeds     ID  Fever Continue current Abx     HEME  stable     ENDO      Global Issues  For possible plating of ribs by Thoracic surgery next week.    LOS: 2 days   Additional  comments:None  Critical Care Total Time*: 15 Minutes  Iktan Aikman A 02/17/2016  *Care during the described time interval was provided by me and/or other providers on the critical care team.  I have reviewed this patient's available data, including medical history, events of note, physical examination and test results as part of my evaluation.

## 2016-02-18 ENCOUNTER — Inpatient Hospital Stay (HOSPITAL_COMMUNITY): Payer: Medicaid Other

## 2016-02-18 DIAGNOSIS — J9809 Other diseases of bronchus, not elsewhere classified: Secondary | ICD-10-CM

## 2016-02-18 DIAGNOSIS — J9601 Acute respiratory failure with hypoxia: Secondary | ICD-10-CM | POA: Diagnosis present

## 2016-02-18 LAB — GLUCOSE, CAPILLARY
GLUCOSE-CAPILLARY: 107 mg/dL — AB (ref 65–99)
Glucose-Capillary: 124 mg/dL — ABNORMAL HIGH (ref 65–99)
Glucose-Capillary: 116 mg/dL — ABNORMAL HIGH (ref 65–99)

## 2016-02-18 LAB — POCT I-STAT 3, ART BLOOD GAS (G3+)
BICARBONATE: 26.7 meq/L — AB (ref 20.0–24.0)
O2 Saturation: 88 %
TCO2: 28 mmol/L (ref 0–100)
pCO2 arterial: 54 mmHg — ABNORMAL HIGH (ref 35.0–45.0)
pH, Arterial: 7.304 — ABNORMAL LOW (ref 7.350–7.450)
pO2, Arterial: 61 mmHg — ABNORMAL LOW (ref 80.0–100.0)

## 2016-02-18 MED ORDER — BUDESONIDE 0.5 MG/2ML IN SUSP
0.5000 mg | Freq: Two times a day (BID) | RESPIRATORY_TRACT | Status: DC
  Administered 2016-02-18 – 2016-03-04 (×30): 0.5 mg via RESPIRATORY_TRACT
  Filled 2016-02-18 (×30): qty 2

## 2016-02-18 MED ORDER — ACETYLCYSTEINE 20 % IN SOLN
4.0000 mL | Freq: Two times a day (BID) | RESPIRATORY_TRACT | Status: AC
  Administered 2016-02-18 – 2016-02-20 (×4): 4 mL via RESPIRATORY_TRACT
  Filled 2016-02-18 (×4): qty 4

## 2016-02-18 MED ORDER — IPRATROPIUM-ALBUTEROL 0.5-2.5 (3) MG/3ML IN SOLN
3.0000 mL | Freq: Four times a day (QID) | RESPIRATORY_TRACT | Status: DC
  Administered 2016-02-18 – 2016-02-21 (×14): 3 mL via RESPIRATORY_TRACT
  Filled 2016-02-18 (×12): qty 3

## 2016-02-18 MED ORDER — IPRATROPIUM-ALBUTEROL 0.5-2.5 (3) MG/3ML IN SOLN
RESPIRATORY_TRACT | Status: AC
  Administered 2016-02-18: 3 mL via RESPIRATORY_TRACT
  Filled 2016-02-18: qty 3

## 2016-02-18 NOTE — Procedures (Signed)
PCCM Video Bronchoscopy Procedure Note  The patient was informed of the risks (including but not limited to bleeding, infection, respiratory failure, lung injury, tooth/oral injury) and benefits of the procedure and gave consent, see chart.  Indication: Mucus plugging, respiratory failure  Post Procedure Diagnosis: Same  Location: [facility] surgical ICU  Condition pre procedure: Critically ill on vent  Medications for procedure: fentanyl gtt, propofol  Procedure description: The bronchoscope was introduced through the tracheostomy and passed to the bilateral lungs to the level of the subsegmental bronchi throughout the tracheobronchial tree.  Airway exam revealed copious, thick secretions throughout the tracheobronchial tree bilaterally, completely occluding the right lower lobe and left upper lobe.  Procedures performed: therapeutic aspiration of the airways  Specimens sent: none  Condition post procedure: critically ill on vent  EBL: none  Complications: none  Roselie Awkward, MD [place] [facility] Pager: [phone] Cell: [phone] After 3pm or if no response, call [phone]

## 2016-02-18 NOTE — Procedures (Signed)
Bedside Bronchoscopy Procedure Note [name] [id number] Jun 08, 1976  Procedure: Bronchoscopy Indications: Remove secretions  Procedure Details: ET Tube Size: ET Tube secured at lip (cm): Bite block in place: No In preparation for procedure, Patient hyper-oxygenated with 100 % FiO2 Airway entered and the following bronchi were examined: LUL.   Patient placed back on 100% FiO2 at conclusion of procedure.    Evaluation BP 106/45 mmHg  Pulse 77  Temp(Src) 100.4 F (38 C) (Core (Comment))  Resp 24  Ht 6' (1.829 m)  Wt 261 lb 14.5 oz (118.8 kg)  BMI 35.51 kg/m2  SpO2 100% Breath Sounds:Rhonch O2 sats: transiently fell during during procedure Patient's Current Condition: stable Specimens:  None Complications: No apparent complications Patient did tolerate procedure well.  Pt tolerated procedure fairly well.  Intermittent episodes of desaturation.  RT will continue to monitor.   [name] 02/18/2016, 2:06 PM

## 2016-02-18 NOTE — Progress Notes (Signed)
Trauma Service Note  Subjective: Thick secretions suctioned overnight, desaturated requiring increased O2  Objective: Vital signs in last 24 hours: Temp:  [98.8 F (37.1 C)-100.9 F (38.3 C)] 99.5 F (37.5 C) (04/30 0700) Pulse Rate:  [71-89] 84 (04/30 0756) Resp:  [24-25] 24 (04/30 0756) BP: (106-138)/(45-58) 106/45 mmHg (04/29 1226) SpO2:  [88 %-100 %] 99 % (04/30 0756) Arterial Line BP: (104-157)/(41-54) 154/48 mmHg (04/30 0700) FiO2 (%):  [50 %-80 %] 50 % (04/30 0756)    Intake/Output from previous day: 04/29 0701 - 04/30 0700 In: 6086.7 [I.V.:5046.7; NG/GT:540; IV Piggyback:500] Out: 6270 [Urine:3750; Chest Tube:190] Intake/Output this shift:    General: intubated sedated  Lungs: coarse b/l, thick secretions on suctioning  Abd: soft, NT, ND  Extremities: no edema  Neuro: GCS 6T  Lab Results: CBC   Recent Labs  02/16/16 0420 02/17/16 0333  WBC 9.8 10.1  HGB 12.0* 11.0*  HCT 36.5* 33.4*  PLT 152 153   BMET  Recent Labs  02/16/16 0420 02/17/16 0333  NA 137 136  K 4.7 4.8  CL 106 104  CO2 24 27  GLUCOSE 130* 134*  BUN 14 8  CREATININE 1.10 1.04  CALCIUM 8.1* 8.0*   PT/INR  Recent Labs  02/15/16 0845  LABPROT 14.3  INR 1.09   ABG  Recent Labs  02/17/16 0435 02/18/16 0432  PHART 7.248* 7.304*  HCO3 26.8* 26.7*    Studies/Results: No results found.  Anti-infectives: Anti-infectives    Start     Dose/Rate Route Frequency Ordered Stop   02/16/16 1500  clindamycin (CLEOCIN) IVPB 600 mg     600 mg 100 mL/hr over 30 Minutes Intravenous Every 6 hours 02/16/16 1420     02/15/16 0144  ceFAZolin (ANCEF) 2-4 GM/100ML-% IVPB    Comments:  Albright, Cassandra : cabinet override      02/15/16 0144 02/15/16 1359   02/15/16 0100  ceFAZolin (ANCEF) IVPB 1 g/50 mL premix     1 g 100 mL/hr over 30 Minutes Intravenous Every 6 hours 02/15/16 0049     02/15/16 0045  ceFAZolin (ANCEF) IVPB 2g/100 mL premix  Status:  Discontinued     2 g 200  mL/hr over 30 Minutes Intravenous Every 8 hours 02/15/16 0043 02/15/16 0236      Medications Scheduled Meds: . antiseptic oral rinse  7 mL Mouth Rinse 10 times per day  .  ceFAZolin (ANCEF) IV  1 g Intravenous Q6H  . chlorhexidine gluconate (SAGE KIT)  15 mL Mouth Rinse BID  . clindamycin (CLEOCIN) IV  600 mg Intravenous Q6H  . enoxaparin (LOVENOX) injection  40 mg Subcutaneous Q24H  . feeding supplement (PIVOT 1.5 CAL)  1,000 mL Per Tube Q24H  . fentaNYL (SUBLIMAZE) injection  50 mcg Intravenous Once  . pantoprazole (PROTONIX) IV  40 mg Intravenous Q24H  . selenium  200 mcg Per Tube Daily  . sodium chloride  1,000 mL Intravenous Once  . sodium chloride  1,000 mL Intravenous Once  . sodium chloride flush  10-40 mL Intracatheter Q12H  . vitamin C  1,000 mg Per Tube Q8H   Continuous Infusions: . sodium chloride 10 mL/hr at 02/18/16 0700  . dextrose 5 % and 0.45 % NaCl with KCl 20 mEq/L 125 mL/hr at 02/18/16 0700  . fentaNYL infusion INTRAVENOUS 400 mcg/hr (02/18/16 0700)  . propofol (DIPRIVAN) infusion 50 mcg/kg/min (02/18/16 0813)   PRN Meds:.Place/Maintain arterial line **AND** sodium chloride, bisacodyl, docusate, fentaNYL, sodium chloride flush  Assessment/Plan: s/p Procedure(s): OPEN  REDUCTION INTERNAL FIXATION (ORIF) MANDIBULAR FRACTURE TRACHEOSTOMY -continue suction and trach care -f/u PCCM recs -possible plating by thoracic surgery this week -continue tube feeds   LOS: 3 days   [place] Surgeon [phone] Surgery 02/18/2016
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
## 2016-02-19 ENCOUNTER — Inpatient Hospital Stay (HOSPITAL_COMMUNITY): Payer: Medicaid Other

## 2016-02-19 ENCOUNTER — Encounter (HOSPITAL_COMMUNITY): Payer: Self-pay | Admitting: Otolaryngology

## 2016-02-19 LAB — CBC
HCT: 28 % — ABNORMAL LOW (ref 39.0–52.0)
HCT: 26.5 % — ABNORMAL LOW (ref 39.0–52.0)
HEMOGLOBIN: 9.3 g/dL — AB (ref 13.0–17.0)
Hemoglobin: 8.8 g/dL — ABNORMAL LOW (ref 13.0–17.0)
MCH: 32.1 pg (ref 26.0–34.0)
MCH: 31.7 pg (ref 26.0–34.0)
MCHC: 33.2 g/dL (ref 30.0–36.0)
MCHC: 33.2 g/dL (ref 30.0–36.0)
MCV: 96.6 fL (ref 78.0–100.0)
MCV: 95.3 fL (ref 78.0–100.0)
Platelets: 197 10*3/uL (ref 150–400)
Platelets: 206 10*3/uL (ref 150–400)
RBC: 2.9 MIL/uL — ABNORMAL LOW (ref 4.22–5.81)
RBC: 2.78 MIL/uL — ABNORMAL LOW (ref 4.22–5.81)
RDW: 12.6 % (ref 11.5–15.5)
RDW: 12.6 % (ref 11.5–15.5)
WBC: 5.8 10*3/uL (ref 4.0–10.5)
WBC: 6.5 10*3/uL (ref 4.0–10.5)

## 2016-02-19 LAB — POCT I-STAT 3, ART BLOOD GAS (G3+)
ACID-BASE EXCESS: 4 mmol/L — AB (ref 0.0–2.0)
ACID-BASE EXCESS: 4 mmol/L — AB (ref 0.0–2.0)
Acid-Base Excess: 3 mmol/L — ABNORMAL HIGH (ref 0.0–2.0)
Acid-Base Excess: 3 mmol/L — ABNORMAL HIGH (ref 0.0–2.0)
BICARBONATE: 28.4 meq/L — AB (ref 20.0–24.0)
BICARBONATE: 29.8 meq/L — AB (ref 20.0–24.0)
Bicarbonate: 29.5 mEq/L — ABNORMAL HIGH (ref 20.0–24.0)
Bicarbonate: 29.8 mEq/L — ABNORMAL HIGH (ref 20.0–24.0)
O2 SAT: 93 %
O2 Saturation: 98 %
O2 Saturation: 96 %
O2 Saturation: 98 %
PH ART: 7.372 (ref 7.350–7.450)
PH ART: 7.384 (ref 7.350–7.450)
PO2 ART: 121 mmHg — AB (ref 80.0–100.0)
PO2 ART: 98 mmHg (ref 80.0–100.0)
Patient temperature: 98.4
TCO2: 30 mmol/L (ref 0–100)
TCO2: 31 mmol/L (ref 0–100)
TCO2: 31 mmol/L (ref 0–100)
TCO2: 31 mmol/L (ref 0–100)
pCO2 arterial: 51.8 mmHg — ABNORMAL HIGH (ref 35.0–45.0)
pCO2 arterial: 52.3 mmHg — ABNORMAL HIGH (ref 35.0–45.0)
pCO2 arterial: 50.3 mmHg — ABNORMAL HIGH (ref 35.0–45.0)
pCO2 arterial: 44.5 mmHg (ref 35.0–45.0)
pH, Arterial: 7.36 (ref 7.350–7.450)
pH, Arterial: 7.412 (ref 7.350–7.450)
pO2, Arterial: 74 mmHg — ABNORMAL LOW (ref 80.0–100.0)
pO2, Arterial: 85 mmHg (ref 80.0–100.0)

## 2016-02-19 LAB — COMPREHENSIVE METABOLIC PANEL
ALT: 14 U/L — ABNORMAL LOW (ref 17–63)
AST: 25 U/L (ref 15–41)
Albumin: 2.1 g/dL — ABNORMAL LOW (ref 3.5–5.0)
Alkaline Phosphatase: 42 U/L (ref 38–126)
Anion gap: 8 (ref 5–15)
BUN: 7 mg/dL (ref 6–20)
CO2: 28 mmol/L (ref 22–32)
Calcium: 8 mg/dL — ABNORMAL LOW (ref 8.9–10.3)
Chloride: 101 mmol/L (ref 101–111)
Creatinine, Ser: 0.77 mg/dL (ref 0.61–1.24)
GFR calc Af Amer: >60 mL/min (ref >60)
GFR calc non Af Amer: >60 mL/min (ref >60)
Glucose, Bld: 121 mg/dL — ABNORMAL HIGH (ref 65–99)
Potassium: 4.4 mmol/L (ref 3.5–5.1)
Sodium: 137 mmol/L (ref 135–145)
Total Bilirubin: 0.5 mg/dL (ref 0.3–1.2)
Total Protein: 5.2 g/dL — ABNORMAL LOW (ref 6.5–8.1)

## 2016-02-19 LAB — PROTIME-INR
INR: 1.14 (ref 0.00–1.49)
Prothrombin Time: 14.8 seconds (ref 11.6–15.2)

## 2016-02-19 LAB — GLUCOSE, CAPILLARY
GLUCOSE-CAPILLARY: 131 mg/dL — AB (ref 65–99)
Glucose-Capillary: 120 mg/dL — ABNORMAL HIGH (ref 65–99)
Glucose-Capillary: 122 mg/dL — ABNORMAL HIGH (ref 65–99)
Glucose-Capillary: 129 mg/dL — ABNORMAL HIGH (ref 65–99)
Glucose-Capillary: 133 mg/dL — ABNORMAL HIGH (ref 65–99)

## 2016-02-19 LAB — URINALYSIS, ROUTINE W REFLEX MICROSCOPIC
Bilirubin Urine: NEGATIVE
Glucose, UA: NEGATIVE mg/dL
Hgb urine dipstick: NEGATIVE
Ketones, ur: NEGATIVE mg/dL
Leukocytes, UA: NEGATIVE
Nitrite: NEGATIVE
Protein, ur: NEGATIVE mg/dL
Specific Gravity, Urine: 1.025 (ref 1.005–1.030)
pH: 6 (ref 5.0–8.0)

## 2016-02-19 LAB — APTT: aPTT: 35 seconds (ref 24–37)

## 2016-02-19 LAB — PREPARE RBC (CROSSMATCH)

## 2016-02-19 MED ORDER — OXYCODONE HCL 5 MG/5ML PO SOLN: 5.0000 mg | ORAL | Status: DC | PRN

## 2016-02-19 MED ORDER — DEXTROSE 5 % IV SOLN
1.5000 g | INTRAVENOUS | Status: DC
Start: 2016-02-20 — End: 2016-02-20

## 2016-02-19 NOTE — Consult Note (Addendum)
Saw [name] and spoke with his wife again this morning. Had another long discussion in relation to the risks and benefits of an epidural at this time. He remains ventilator dependent on propofol and fentanyl gtts. His coagulation studies have been normal, his am platelet count is pending. I have spoke with his nurse Colletta Maryland and if we are able to wean his sedation to a point where we can monitor a neurological exam (in the rare case of an epidural hematoma) then we can more safely proceed with the procedure at this time. He will also need to tolerate the positioning requirements for the procedure, as I know turning him has been an issue from a pulmonary standpoint.   Veatrice Kells, MD Please call OR front desk and they will connect you to the Anesthesiologist on call    Update 12:45pm Checked back on [name] at 12:50. Attempt to wean propofol to a sedation level where we can conduct a neuro exam and he can hold still for the procedure failed due to agitation and constant movement. We will follow his progress and attempt epidural placement when it is safe to do so. His wife is in complete agreement with this plan.

## 2016-02-19 NOTE — Progress Notes (Signed)
Follow up - Trauma and Critical Care  Patient Details:    [name] is an 40 y.o. male.  Lines/tubes : PICC Triple Lumen [id number] PICC Right Basilic 43 cm 0 cm (Active)  Indication for Insertion or Continuance of Line Vasoactive infusions 02/19/2016  7:05 AM  Exposed Catheter (cm) 0 cm 02/17/2016 11:38 AM  Site Assessment Clean;Dry;Intact 02/18/2016  8:09 PM  Lumen #1 Status Infusing 02/18/2016  8:09 PM  Lumen #2 Status Infusing 02/18/2016  8:09 PM  Lumen #3 Status Infusing 02/18/2016  8:09 PM  Dressing Type Transparent 02/18/2016  8:09 PM  Dressing Status Clean;Dry;Intact;Antimicrobial disc in place 02/18/2016  8:09 PM  Line Care Connections checked and tightened 02/18/2016  8:09 PM  Line Adjustment (NICU/IV Team Only) No 02/17/2016 11:38 AM  Dressing Intervention New dressing 02/17/2016 11:38 AM  Dressing Change Due 02/24/16 02/18/2016  8:00 AM     Arterial Line 02/15/16 Right Radial (Active)  Site Assessment Clean;Dry;Intact 02/18/2016  8:09 PM  Line Status Pulsatile blood flow 02/18/2016  8:09 PM  Art Line Waveform Appropriate 02/18/2016  8:09 PM  Art Line Interventions Zeroed and calibrated;Leveled;Connections checked and tightened;Flushed per protocol 02/18/2016  8:09 PM  Color/Movement/Sensation Capillary refill less than 3 sec 02/18/2016  8:09 PM  Dressing Type Transparent 02/18/2016  8:09 PM  Dressing Status Clean;Dry;Intact;Antimicrobial disc in place 02/18/2016  8:09 PM  Dressing Change Due 02/22/16 02/18/2016  8:00 AM     Chest Tube Left Pleural (Active)  Suction -20 cm H2O 02/18/2016  8:10 PM  Chest Tube Air Leak None 02/18/2016  8:10 PM  Patency Intervention Tip/tilt 02/17/2016  7:46 AM  Drainage Description Serosanguineous 02/18/2016  8:10 PM  Dressing Status New drainage 02/18/2016  8:10 PM  Dressing Intervention Dressing changed 02/18/2016  3:00 PM  Site Assessment Clean;Dry;Intact 02/18/2016  8:10 PM  Surrounding Skin Dry;Reddened 02/18/2016  8:10 PM  Output (mL) 60 mL 02/19/2016   3:00 AM     NG/OG Tube Nasogastric 18 Fr. Right nare (Active)  Placement Verification Auscultation 02/18/2016  8:10 PM  Site Assessment Clean;Dry;Intact 02/18/2016  8:10 PM  Status Infusing tube feed;Retaped 02/18/2016  8:10 PM  Amount of suction 45 mmHg 02/16/2016  2:20 PM  Drainage Appearance Bile 02/16/2016  2:20 PM  Intake (mL) 30 mL 02/19/2016 12:00 AM  Output (mL) 0 mL 02/17/2016  6:00 PM     Urethral Catheter ken emt  Temperature probe 18 Fr. (Active)  Indication for Insertion or Continuance of Catheter Unstable critical patients (first 24-48 hours);Peri-operative use for selective surgical procedure 02/19/2016  7:05 AM  Site Assessment Clean;Intact 02/18/2016  8:10 PM  Catheter Maintenance Bag below level of bladder;Catheter secured;Drainage bag/tubing not touching floor;Insertion date on drainage bag;No dependent loops;Seal intact 02/19/2016  7:05 AM  Collection Container Standard drainage bag 02/18/2016  8:10 PM  Securement Method Leg strap 02/18/2016  8:10 PM  Urinary Catheter Interventions Unclamped 02/17/2016  8:00 PM  Output (mL) 300 mL 02/19/2016  7:00 AM    Microbiology/Sepsis markers: Results for orders placed or performed during the hospital encounter of 02/14/16  MRSA PCR Screening     Status: None   Collection Time: 02/15/16  3:02 AM  Result Value Ref Range Status   MRSA by PCR NEGATIVE NEGATIVE Final    Comment:        The GeneXpert MRSA Assay (FDA approved for NASAL specimens only), is one component of a comprehensive MRSA colonization surveillance program. It is not intended to diagnose MRSA infection nor to guide  or monitor treatment for MRSA infections.   Culture, blood (Routine X 2) w Reflex to ID Panel     Status: None (Preliminary result)   Collection Time: 02/17/16  3:37 PM  Result Value Ref Range Status   Specimen Description BLOOD BLOOD LEFT FOREARM  Final   Special Requests BOTTLES DRAWN AEROBIC AND ANAEROBIC 5CC  Final   Culture NO GROWTH < 24 HOURS  Final    Report Status PENDING  Incomplete  Culture, blood (Routine X 2) w Reflex to ID Panel     Status: None (Preliminary result)   Collection Time: 02/17/16  3:39 PM  Result Value Ref Range Status   Specimen Description BLOOD BLOOD LEFT FOREARM  Final   Special Requests BOTTLES DRAWN AEROBIC AND ANAEROBIC 5CC  Final   Culture NO GROWTH < 24 HOURS  Final   Report Status PENDING  Incomplete  Culture, respiratory (NON-Expectorated)     Status: None (Preliminary result)   Collection Time: 02/17/16  3:52 PM  Result Value Ref Range Status   Specimen Description TRACHEAL ASPIRATE  Final   Special Requests Normal  Final   Gram Stain PENDING  Incomplete   Culture   Final    Culture reincubated for better growth Performed at [facility]    Report Status PENDING  Incomplete    Anti-infectives:  Anti-infectives    Start     Dose/Rate Route Frequency Ordered Stop   02/16/16 1500  clindamycin (CLEOCIN) IVPB 600 mg     600 mg 100 mL/hr over 30 Minutes Intravenous Every 6 hours 02/16/16 1420     02/15/16 0144  ceFAZolin (ANCEF) 2-4 GM/100ML-% IVPB    Comments:  Albright, Cassandra : cabinet override      02/15/16 0144 02/15/16 1359   02/15/16 0100  ceFAZolin (ANCEF) IVPB 1 g/50 mL premix  Status:  Discontinued     1 g 100 mL/hr over 30 Minutes Intravenous Every 6 hours 02/15/16 0049 02/19/16 0754   02/15/16 0045  ceFAZolin (ANCEF) IVPB 2g/100 mL premix  Status:  Discontinued     2 g 200 mL/hr over 30 Minutes Intravenous Every 8 hours 02/15/16 0043 02/15/16 0236      Best Practice/Protocols:  VTE Prophylaxis: Lovenox (prophylaxtic dose) and Mechanical GI Prophylaxis: Proton Pump Inhibitor Continous Sedation  Consults: Treatment Team:  Melissa Montane, MD Ivin Poot, MD    Events:  Subjective:    Overnight Issues: Patient desaturated this AM.  On FIO2 80% with PEEP +8  Objective:  Vital signs for last 24 hours: Temp:  [98.8 F (37.1 C)-100.8 F (38.2 C)] 98.8 F (37.1  C) (05/01 0700) Pulse Rate:  [72-105] 72 (05/01 0700) Resp:  [20-24] 24 (05/01 0700) SpO2:  [86 %-100 %] 100 % (05/01 0700) Arterial Line BP: (126-187)/(51-86) 144/57 mmHg (05/01 0700) FiO2 (%):  [50 %-100 %] 80 % (05/01 0600) Weight:  [121.6 kg (268 lb 1.3 oz)] 121.6 kg (268 lb 1.3 oz) (05/01 0600)  Hemodynamic parameters for last 24 hours:    Intake/Output from previous day: 04/30 0701 - 05/01 0700 In: 6025.8 [I.V.:5025.8; NG/GT:600; IV Piggyback:400] Out: [id number] [Urine:3250; Chest Tube:210]  Intake/Output this shift:    Vent settings for last 24 hours: Vent Mode:  [-] PRVC FiO2 (%):  [50 %-100 %] 80 % Set Rate:  [24 bmp] 24 bmp Vt Set:  [550 mL] 550 mL PEEP:  [8 cmH20] 8 cmH20 Plateau Pressure:  [20 cmH20-25 cmH20] 25 cmH20  Physical Exam:  General: no  respiratory distress Neuro: nonfocal exam and RASS -1 HEENT/Neck: no JVD and trach-clean, intact Resp: rhonchi bilaterally and wheezes bilaterally CVS: regular rate and rhythm, S1, S2 normal, no murmur, click, rub or gallop GI: soft, nontender, BS WNL, no r/g and Tolerating tube feedings through NGT well. Extremities: edema 1+ and Good palpable pulses bilaterally.  Results for orders placed or performed during the hospital encounter of 02/14/16 (from the past 24 hour(s))  Glucose, capillary     Status: Abnormal   Collection Time: 02/18/16  8:01 PM  Result Value Ref Range   Glucose-Capillary 116 (H) 65 - 99 mg/dL   Comment 1 Notify RN    Comment 2 Document in Chart   I-STAT 3, arterial blood gas (G3+)     Status: Abnormal   Collection Time: 02/19/16 12:22 AM  Result Value Ref Range   pH, Arterial 7.372 7.350 - 7.450   pCO2 arterial 51.8 (H) 35.0 - 45.0 mmHg   pO2, Arterial 121.0 (H) 80.0 - 100.0 mmHg   Bicarbonate 29.8 (H) 20.0 - 24.0 mEq/L   TCO2 31 0 - 100 mmol/L   O2 Saturation 98.0 %   Acid-Base Excess 4.0 (H) 0.0 - 2.0 mmol/L   Patient temperature 37.8 C    Collection site ARTERIAL LINE    Sample type  ARTERIAL   Glucose, capillary     Status: Abnormal   Collection Time: 02/19/16 12:32 AM  Result Value Ref Range   Glucose-Capillary 129 (H) 65 - 99 mg/dL   Comment 1 Notify RN    Comment 2 Document in Chart   Glucose, capillary     Status: Abnormal   Collection Time: 02/19/16  4:05 AM  Result Value Ref Range   Glucose-Capillary 131 (H) 65 - 99 mg/dL   Comment 1 Notify RN    Comment 2 Document in Chart   I-STAT 3, arterial blood gas (G3+)     Status: Abnormal   Collection Time: 02/19/16  4:17 AM  Result Value Ref Range   pH, Arterial 7.360 7.350 - 7.450   pCO2 arterial 52.3 (H) 35.0 - 45.0 mmHg   pO2, Arterial 74.0 (L) 80.0 - 100.0 mmHg   Bicarbonate 29.5 (H) 20.0 - 24.0 mEq/L   TCO2 31 0 - 100 mmol/L   O2 Saturation 93.0 %   Acid-Base Excess 3.0 (H) 0.0 - 2.0 mmol/L   Patient temperature 37.3 C    Collection site ARTERIAL LINE    Sample type ARTERIAL      Assessment/Plan:   NEURO  Altered Mental Status:  sedation   Plan: Gets very agitated when sedation is off  PULM  Atelectasis/collapse (focal and LLL)  Also multiple left displaced rib fractures with hemopneumothorax.  CXR improved overall, but patient not improving much functionally.  CT in place with minimal drainage.   Plan: Patient was bronchoscoped over the weekend and mucous plugs removed.  Still on high vent settings with FIO2 80% and PEEP +8.  Sats are currently 100%.  Will repeat the ABG at0900  CARDIO  CPM   Plan: No specific treatment  RENAL  No significant issues. Urine output and renal function are okay.   Plan: No changes  GI  No issues   Plan: Continue tube feedings.  ID  No known infectious source   Plan: Only on Kefzol and Clinda.  Kefzol was for CT placement in the ED.  Can stop this  HEME  Anemia acute blood loss anemia)   Plan: Does not require transfusion  ENDO No specific issues   Plan: CPM  Global Issues  Multiple rib fractures with displacement continue to be a problem, and will possible  go to the OR for plating when the patient has improved a bit on the ventilator.  Currently will increase PEEP to +10, and drop FIO2 to 70%.  Recheck ABG later.  Recheck CXR tomorrow.    LOS: 4 days   Additional comments:I reviewed the patient's new clinical lab test results. cbc/bmet/abg and I reviewed the patients new imaging test results. cxr  Critical Care Total Time*: 30 Minutes  [facility] 02/19/2016  *Care during the described time interval was provided by me and/or other providers on the critical care team.  I have reviewed this patient's available data, including medical history, events of note, physical examination and test results as part of my evaluation.

## 2016-02-19 NOTE — Progress Notes (Signed)
3 Days Post-Op Procedure(s) (LRB): OPEN REDUCTION INTERNAL FIXATION (ORIF) MANDIBULAR FRACTURE (N/A) TRACHEOSTOMY (N/A) Subjective: L rib plating planned for tomorrow Procedure discussed with patients wife  Objective: Vital signs in last 24 hours: Temp:  [98.8 F (37.1 C)-100.6 F (38.1 C)] 99.9 F (37.7 C) (05/01 1900) Pulse Rate:  [71-124] 83 (05/01 1900) Cardiac Rhythm:  [-] Normal sinus rhythm (05/01 1300) Resp:  [22-39] 22 (05/01 1900) BP: (103-169)/(49-72) 116/61 mmHg (05/01 1900) SpO2:  [75 %-100 %] 99 % (05/01 1900) Arterial Line BP: (105-174)/(46-70) 128/58 mmHg (05/01 1900) FiO2 (%):  [60 %-80 %] 60 % (05/01 1900) Weight:  [268 lb 1.3 oz (121.6 kg)] 268 lb 1.3 oz (121.6 kg) (05/01 0600)  Hemodynamic parameters for last 24 hours:    Intake/Output from previous day: 04/30 0701 - 05/01 0700 In: 6025.8 [I.V.:5025.8; NG/GT:600; IV Piggyback:400] Out: [id number] [Urine:3250; Chest Tube:210] Intake/Output this shift:   On vent with trach Scattered rhonci   Lab Results:  Recent Labs  02/19/16 0900 02/19/16 1745  WBC 5.8 6.5  HGB 9.3* 8.8*  HCT 28.0* 26.5*  PLT 197 206   BMET:  Recent Labs  02/17/16 0333 02/19/16 1745  NA 136 137  K 4.8 4.4  CL 104 101  CO2 27 28  GLUCOSE 134* 121*  BUN 8 7  CREATININE 1.04 0.77  CALCIUM 8.0* 8.0*    PT/INR:  Recent Labs  02/19/16 1745  LABPROT 14.8  INR 1.14   ABG    Component Value Date/Time   PHART 7.412 02/19/2016 1556   HCO3 28.4* 02/19/2016 1556   TCO2 30 02/19/2016 1556   ACIDBASEDEF 1.0 02/17/2016 0435   O2SAT 98.0 02/19/2016 1556   CBG (last 3)   Recent Labs  02/19/16 0813 02/19/16 1317 02/19/16 1716  GLUCAP 122* 133* 120*    Assessment/Plan: S/P Procedure(s) (LRB): OPEN REDUCTION INTERNAL FIXATION (ORIF) MANDIBULAR FRACTURE (N/A) TRACHEOSTOMY (N/A) L RIB PLATING TOMORROW STOP TUIBE FEEDS AT 2 AM   LOS: 4 days    [name] 02/19/2016

## 2016-02-20 ENCOUNTER — Inpatient Hospital Stay (HOSPITAL_COMMUNITY): Payer: Medicaid Other

## 2016-02-20 ENCOUNTER — Inpatient Hospital Stay (HOSPITAL_COMMUNITY): Payer: Medicaid Other | Admitting: Anesthesiology

## 2016-02-20 ENCOUNTER — Encounter (HOSPITAL_COMMUNITY): Admission: EM | Disposition: A | Discharge status: Home / Self Care | Payer: Self-pay | Source: Home / Self Care

## 2016-02-20 ENCOUNTER — Encounter (HOSPITAL_COMMUNITY): Payer: Self-pay | Admitting: *Deleted

## 2016-02-20 DIAGNOSIS — S225XXA Flail chest, initial encounter for closed fracture: Secondary | ICD-10-CM

## 2016-02-20 HISTORY — PX: RIB PLATING: SHX5079

## 2016-02-20 LAB — CBC WITH DIFFERENTIAL/PLATELET
BASOS PCT: 0 %
Basophils Absolute: 0 10*3/uL (ref 0.0–0.1)
EOS ABS: 0.2 10*3/uL (ref 0.0–0.7)
Eosinophils Relative: 3 %
HCT: 24.6 % — ABNORMAL LOW (ref 39.0–52.0)
Hemoglobin: 8.2 g/dL — ABNORMAL LOW (ref 13.0–17.0)
LYMPHS ABS: 0.8 10*3/uL (ref 0.7–4.0)
Lymphocytes Relative: 10 %
MCH: 31.7 pg (ref 26.0–34.0)
MCHC: 33.3 g/dL (ref 30.0–36.0)
MCV: 95 fL (ref 78.0–100.0)
MONO ABS: 0.8 10*3/uL (ref 0.1–1.0)
MONOS PCT: 11 %
NEUTROS ABS: 5.6 10*3/uL (ref 1.7–7.7)
Neutrophils Relative %: 76 %
Platelets: 191 10*3/uL (ref 150–400)
RBC: 2.59 MIL/uL — ABNORMAL LOW (ref 4.22–5.81)
RDW: 12.7 % (ref 11.5–15.5)
WBC: 7.4 10*3/uL (ref 4.0–10.5)

## 2016-02-20 LAB — POCT I-STAT 4, (NA,K, GLUC, HGB,HCT)
GLUCOSE: 137 mg/dL — AB (ref 65–99)
Glucose, Bld: 129 mg/dL — ABNORMAL HIGH (ref 65–99)
HCT: 26 % — ABNORMAL LOW (ref 39.0–52.0)
HEMATOCRIT: 27 % — AB (ref 39.0–52.0)
HEMOGLOBIN: 8.8 g/dL — AB (ref 13.0–17.0)
Hemoglobin: 9.2 g/dL — ABNORMAL LOW (ref 13.0–17.0)
Potassium: 3.8 mmol/L (ref 3.5–5.1)
Potassium: 4 mmol/L (ref 3.5–5.1)
SODIUM: 138 mmol/L (ref 135–145)
SODIUM: 137 mmol/L (ref 135–145)

## 2016-02-20 LAB — GLUCOSE, CAPILLARY
Glucose-Capillary: 134 mg/dL — ABNORMAL HIGH (ref 65–99)
Glucose-Capillary: 124 mg/dL — ABNORMAL HIGH (ref 65–99)
Glucose-Capillary: 118 mg/dL — ABNORMAL HIGH (ref 65–99)
Glucose-Capillary: 138 mg/dL — ABNORMAL HIGH (ref 65–99)

## 2016-02-20 LAB — BASIC METABOLIC PANEL
Anion gap: 8 (ref 5–15)
BUN: 7 mg/dL (ref 6–20)
CO2: 28 mmol/L (ref 22–32)
Calcium: 8 mg/dL — ABNORMAL LOW (ref 8.9–10.3)
Chloride: 101 mmol/L (ref 101–111)
Creatinine, Ser: 0.72 mg/dL (ref 0.61–1.24)
GFR calc Af Amer: >60 mL/min (ref >60)
GFR calc non Af Amer: >60 mL/min (ref >60)
Glucose, Bld: 135 mg/dL — ABNORMAL HIGH (ref 65–99)
Potassium: 4.2 mmol/L (ref 3.5–5.1)
Sodium: 137 mmol/L (ref 135–145)

## 2016-02-20 LAB — CULTURE, RESPIRATORY: SPECIAL REQUESTS: NORMAL

## 2016-02-20 LAB — PREPARE RBC (CROSSMATCH)

## 2016-02-20 LAB — TRIGLYCERIDES: Triglycerides: 122 mg/dL (ref <150)

## 2016-02-20 LAB — CULTURE, RESPIRATORY W GRAM STAIN

## 2016-02-20 SURGERY — FIXATION, RIB, USING PLATE
Anesthesia: General | Laterality: Left

## 2016-02-20 MED ORDER — METOCLOPRAMIDE HCL 5 MG/ML IJ SOLN
10.0000 mg | Freq: Four times a day (QID) | INTRAMUSCULAR | Status: AC
  Administered 2016-02-20 – 2016-02-21 (×4): 10 mg via INTRAVENOUS
  Filled 2016-02-20 (×4): qty 2

## 2016-02-20 MED ORDER — PIPERACILLIN-TAZOBACTAM 3.375 G IVPB
3.3750 g | Freq: Three times a day (TID) | INTRAVENOUS | Status: DC
  Filled 2016-02-20: qty 50

## 2016-02-20 MED ORDER — ACETAMINOPHEN 500 MG PO TABS
1000.0000 mg | ORAL_TABLET | Freq: Four times a day (QID) | ORAL | Status: AC
  Administered 2016-02-20 – 2016-02-21 (×4): 1000 mg via ORAL
  Filled 2016-02-20 (×5): qty 2

## 2016-02-20 MED ORDER — HEMOSTATIC AGENTS (NO CHARGE) OPTIME
TOPICAL | Status: DC | PRN
  Administered 2016-02-20: 1 via TOPICAL

## 2016-02-20 MED ORDER — FUROSEMIDE 10 MG/ML IJ SOLN
40.0000 mg | Freq: Once | INTRAMUSCULAR | Status: AC
  Administered 2016-02-20: 40 mg via INTRAVENOUS
  Filled 2016-02-20: qty 4

## 2016-02-20 MED ORDER — FENTANYL CITRATE (PF) 100 MCG/2ML IJ SOLN
INTRAMUSCULAR | Status: DC | PRN
  Administered 2016-02-20: 100 ug via INTRAVENOUS
  Administered 2016-02-20: 50 ug via INTRAVENOUS
  Administered 2016-02-20 (×2): 100 ug via INTRAVENOUS
  Administered 2016-02-20: 50 ug via INTRAVENOUS
  Administered 2016-02-20: 100 ug via INTRAVENOUS

## 2016-02-20 MED ORDER — FENTANYL CITRATE (PF) 250 MCG/5ML IJ SOLN
INTRAMUSCULAR | Status: AC
  Filled 2016-02-20: qty 5

## 2016-02-20 MED ORDER — MIDAZOLAM HCL 2 MG/2ML IJ SOLN
INTRAMUSCULAR | Status: AC
  Filled 2016-02-20: qty 2

## 2016-02-20 MED ORDER — SODIUM CHLORIDE 0.9 % IV SOLN
3.0000 g | Freq: Four times a day (QID) | INTRAVENOUS | Status: DC
  Administered 2016-02-20 – 2016-03-01 (×41): 3 g via INTRAVENOUS
  Filled 2016-02-20 (×43): qty 3

## 2016-02-20 MED ORDER — LEVALBUTEROL HCL 0.63 MG/3ML IN NEBU: 0.6300 mg | INHALATION_SOLUTION | Freq: Four times a day (QID) | RESPIRATORY_TRACT | Status: DC

## 2016-02-20 MED ORDER — PIPERACILLIN-TAZOBACTAM 3.375 G IVPB 30 MIN
3.3750 g | Freq: Once | INTRAVENOUS | Status: DC
  Filled 2016-02-20: qty 50

## 2016-02-20 MED ORDER — PROPOFOL 10 MG/ML IV BOLUS
INTRAVENOUS | Status: DC | PRN
  Administered 2016-02-20 (×4): 50 mg via INTRAVENOUS

## 2016-02-20 MED ORDER — VANCOMYCIN HCL IN DEXTROSE 1-5 GM/200ML-% IV SOLN
1000.0000 mg | Freq: Two times a day (BID) | INTRAVENOUS | Status: AC
  Administered 2016-02-21 (×2): 1000 mg via INTRAVENOUS
  Filled 2016-02-20 (×2): qty 200

## 2016-02-20 MED ORDER — OXYCODONE HCL 5 MG PO TABS: 5.0000 mg | ORAL_TABLET | ORAL | Status: DC | PRN

## 2016-02-20 MED ORDER — BISACODYL 5 MG PO TBEC
10.0000 mg | DELAYED_RELEASE_TABLET | Freq: Every day | ORAL | Status: DC
  Administered 2016-02-20: 10 mg via ORAL
  Filled 2016-02-20 (×2): qty 2

## 2016-02-20 MED ORDER — VANCOMYCIN HCL 1000 MG IV SOLR
1000.0000 mg | Freq: Two times a day (BID) | INTRAVENOUS | Status: DC
  Administered 2016-02-20: 1000 mg via INTRAVENOUS
  Filled 2016-02-20: qty 1000

## 2016-02-20 MED ORDER — SODIUM CHLORIDE 0.9 % IV SOLN
INTRAVENOUS | Status: DC | PRN
Start: 2016-02-20 — End: 2016-02-20
  Administered 2016-02-20: 17:00:00 via INTRAVENOUS

## 2016-02-20 MED ORDER — POTASSIUM CHLORIDE IN NACL 20-0.45 MEQ/L-% IV SOLN
INTRAVENOUS | Status: DC
  Administered 2016-02-20: 100 mL via INTRAVENOUS
  Administered 2016-02-21: 100 mL/h via INTRAVENOUS
  Administered 2016-02-21: 100 mL via INTRAVENOUS
  Filled 2016-02-20 (×7): qty 1000

## 2016-02-20 MED ORDER — PHENYLEPHRINE 40 MCG/ML (10ML) SYRINGE FOR IV PUSH (FOR BLOOD PRESSURE SUPPORT)
PREFILLED_SYRINGE | INTRAVENOUS | Status: AC
  Filled 2016-02-20: qty 10

## 2016-02-20 MED ORDER — LACTATED RINGERS IV SOLN: INTRAVENOUS | Status: DC | PRN

## 2016-02-20 MED ORDER — LACTATED RINGERS IV SOLN
INTRAVENOUS | Status: DC | PRN
  Administered 2016-02-20: 14:00:00 via INTRAVENOUS

## 2016-02-20 MED ORDER — PROPOFOL 10 MG/ML IV BOLUS
INTRAVENOUS | Status: AC
  Filled 2016-02-20: qty 20

## 2016-02-20 MED ORDER — POTASSIUM CHLORIDE 10 MEQ/50ML IV SOLN
10.0000 meq | Freq: Every day | INTRAVENOUS | Status: DC | PRN
  Administered 2016-02-23 – 2016-02-25 (×5): 10 meq via INTRAVENOUS
  Filled 2016-02-20 (×5): qty 50

## 2016-02-20 MED ORDER — SODIUM CHLORIDE 0.9 % IJ SOLN
INTRAMUSCULAR | Status: AC
  Filled 2016-02-20: qty 10

## 2016-02-20 MED ORDER — ROCURONIUM BROMIDE 50 MG/5ML IV SOLN
INTRAVENOUS | Status: AC
  Filled 2016-02-20: qty 1

## 2016-02-20 MED ORDER — DOCUSATE SODIUM 50 MG/5ML PO LIQD
100.0000 mg | Freq: Two times a day (BID) | ORAL | Status: DC
  Administered 2016-02-20 – 2016-03-02 (×13): 100 mg
  Filled 2016-02-20 (×20): qty 10

## 2016-02-20 MED ORDER — VECURONIUM BROMIDE 10 MG IV SOLR
INTRAVENOUS | Status: AC
  Filled 2016-02-20: qty 20

## 2016-02-20 MED ORDER — ONDANSETRON HCL 4 MG/2ML IJ SOLN: 4.0000 mg | Freq: Four times a day (QID) | INTRAMUSCULAR | Status: DC | PRN

## 2016-02-20 MED ORDER — VANCOMYCIN HCL IN DEXTROSE 1-5 GM/200ML-% IV SOLN
INTRAVENOUS | Status: AC
  Filled 2016-02-20: qty 200

## 2016-02-20 MED ORDER — ONDANSETRON HCL 4 MG/2ML IJ SOLN
INTRAMUSCULAR | Status: AC
  Filled 2016-02-20: qty 2

## 2016-02-20 MED ORDER — ROCURONIUM BROMIDE 100 MG/10ML IV SOLN
INTRAVENOUS | Status: DC | PRN
  Administered 2016-02-20 (×3): 50 mg via INTRAVENOUS

## 2016-02-20 MED ORDER — ACETAMINOPHEN 160 MG/5ML PO SOLN
1000.0000 mg | Freq: Four times a day (QID) | ORAL | Status: AC
  Administered 2016-02-21 – 2016-02-25 (×16): 1000 mg via ORAL
  Filled 2016-02-20 (×16): qty 40.6

## 2016-02-20 MED ORDER — ACETAMINOPHEN 160 MG/5ML PO SOLN: 650.0000 mg | ORAL | Status: DC | PRN

## 2016-02-20 MED ORDER — LEVOFLOXACIN IN D5W 750 MG/150ML IV SOLN
750.0000 mg | INTRAVENOUS | Status: DC
  Filled 2016-02-20: qty 150

## 2016-02-20 MED ORDER — BISACODYL 10 MG RE SUPP: 10.0000 mg | Freq: Once | RECTAL | Status: DC

## 2016-02-20 MED ORDER — PRO-STAT SUGAR FREE PO LIQD
60.0000 mL | Freq: Every day | ORAL | Status: DC
  Administered 2016-02-20 – 2016-02-22 (×9): 60 mL
  Filled 2016-02-20 (×10): qty 60

## 2016-02-20 MED ORDER — SODIUM CHLORIDE 0.9 % IV SOLN: 10.0000 mL/h | Freq: Once | INTRAVENOUS | Status: DC

## 2016-02-20 MED ORDER — VANCOMYCIN HCL 1000 MG IV SOLR
INTRAVENOUS | Status: DC | PRN
  Administered 2016-02-20: 1000 mL

## 2016-02-20 MED ORDER — PIVOT 1.5 CAL PO LIQD
1000.0000 mL | ORAL | Status: DC
  Administered 2016-02-21: 1000 mL
  Filled 2016-02-20: qty 1000

## 2016-02-20 MED ORDER — 0.9 % SODIUM CHLORIDE (POUR BTL) OPTIME
TOPICAL | Status: DC | PRN
  Administered 2016-02-20: 1000 mL

## 2016-02-20 MED ORDER — MIDAZOLAM HCL 5 MG/5ML IJ SOLN
INTRAMUSCULAR | Status: DC | PRN
  Administered 2016-02-20: 2 mg via INTRAVENOUS

## 2016-02-20 MED ORDER — VANCOMYCIN HCL 1000 MG IV SOLR
INTRAVENOUS | Status: DC
  Filled 2016-02-20: qty 1000

## 2016-02-20 MED ORDER — VECURONIUM BROMIDE 10 MG IV SOLR
INTRAVENOUS | Status: DC | PRN
  Administered 2016-02-20 (×2): 3 mg via INTRAVENOUS
  Administered 2016-02-20 (×2): 4 mg via INTRAVENOUS
  Administered 2016-02-20: 2 mg via INTRAVENOUS
  Administered 2016-02-20: 4 mg via INTRAVENOUS

## 2016-02-20 MED ORDER — SENNOSIDES-DOCUSATE SODIUM 8.6-50 MG PO TABS
1.0000 | ORAL_TABLET | Freq: Every day | ORAL | Status: DC
  Administered 2016-02-20 – 2016-02-21 (×2): 1 via ORAL
  Filled 2016-02-20 (×2): qty 1

## 2016-02-20 SURGICAL SUPPLY — 89 items
BAG DECANTER FOR FLEXI CONT (MISCELLANEOUS) IMPLANT
BATTERY PACK STR FOR DRIVER (MISCELLANEOUS) ×6 IMPLANT
BLADE SURG 11 STRL SS (BLADE) ×3 IMPLANT
CANISTER SUCTION 2500CC (MISCELLANEOUS) ×6 IMPLANT
CATH KIT ON Q 5IN SLV (PAIN MANAGEMENT) IMPLANT
CATH ROBINSON RED A/P 22FR (CATHETERS) IMPLANT
CATH THORACIC 28FR (CATHETERS) ×3 IMPLANT
CATH THORACIC 36FR (CATHETERS) IMPLANT
CATH THORACIC 36FR RT ANG (CATHETERS) IMPLANT
CHERRY SPONGEY 1/2 (GAUZE/BANDAGES/DRESSINGS) ×3 IMPLANT
CLIP TI MEDIUM 24 (CLIP) ×3 IMPLANT
CONN Y 3/8X3/8X3/8  BEN (MISCELLANEOUS) ×2
CONN Y 3/8X3/8X3/8 BEN (MISCELLANEOUS) ×1 IMPLANT
CONT SPEC 4OZ CLIKSEAL STRL BL (MISCELLANEOUS) ×6 IMPLANT
COVER SURGICAL LIGHT HANDLE (MISCELLANEOUS) ×6 IMPLANT
DERMABOND ADHESIVE PROPEN (GAUZE/BANDAGES/DRESSINGS) ×2
DERMABOND ADVANCED (GAUZE/BANDAGES/DRESSINGS)
DERMABOND ADVANCED .7 DNX12 (GAUZE/BANDAGES/DRESSINGS) IMPLANT
DERMABOND ADVANCED .7 DNX6 (GAUZE/BANDAGES/DRESSINGS) ×1 IMPLANT
DRAPE LAPAROSCOPIC ABDOMINAL (DRAPES) ×3 IMPLANT
DRAPE WARM FLUID 44X44 (DRAPE) ×6 IMPLANT
DRILL BIT 2.2MM W/14M STOP (BIT) ×3 IMPLANT
DRSG AQUACEL AG ADV 3.5X14 (GAUZE/BANDAGES/DRESSINGS) ×3 IMPLANT
ELECT BLADE 4.0 EZ CLEAN MEGAD (MISCELLANEOUS) ×3
ELECT REM PT RETURN 9FT ADLT (ELECTROSURGICAL) ×3
ELECTRODE BLDE 4.0 EZ CLN MEGD (MISCELLANEOUS) ×1 IMPLANT
ELECTRODE REM PT RTRN 9FT ADLT (ELECTROSURGICAL) ×1 IMPLANT
GAUZE SPONGE 4X4 12PLY STRL (GAUZE/BANDAGES/DRESSINGS) ×3 IMPLANT
GEL ULTRASOUND 20GR AQUASONIC (MISCELLANEOUS) IMPLANT
GLOVE SS BIOGEL STRL SZ 7.5 (GLOVE) ×1 IMPLANT
GLOVE SUPERSENSE BIOGEL SZ 7.5 (GLOVE) ×2
GLOVE SURG SIGNA 7.5 PF LTX (GLOVE) ×6 IMPLANT
GOWN STRL REUS W/ TWL LRG LVL3 (GOWN DISPOSABLE) ×2 IMPLANT
GOWN STRL REUS W/TWL LRG LVL3 (GOWN DISPOSABLE) ×4
KIT BASIN OR (CUSTOM PROCEDURE TRAY) ×3 IMPLANT
KIT ROOM TURNOVER OR (KITS) ×3 IMPLANT
KIT SUCTION CATH 14FR (SUCTIONS) ×3 IMPLANT
NS IRRIG 1000ML POUR BTL (IV SOLUTION) ×12 IMPLANT
PACK CHEST (CUSTOM PROCEDURE TRAY) ×3 IMPLANT
PAD ARMBOARD 7.5X6 YLW CONV (MISCELLANEOUS) ×6 IMPLANT
PLATE LEFT RIBS 4-5 16 HOLE (Plate) ×6 IMPLANT
PLATE UNIVERSAL 8 HOLE (Plate) ×3 IMPLANT
RIB TEMPLATE MATRIX (Plate) ×3 IMPLANT
SCREW SELF TAP MAT 2.9X14MM (Screw) ×84 IMPLANT
SEALANT SURG COSEAL 4ML (VASCULAR PRODUCTS) IMPLANT
SOLUTION ANTI FOG 6CC (MISCELLANEOUS) ×3 IMPLANT
SPECIMEN JAR MEDIUM (MISCELLANEOUS) ×6 IMPLANT
SPONGE GAUZE 4X4 12PLY STER LF (GAUZE/BANDAGES/DRESSINGS) ×3 IMPLANT
SPONGE LAP 18X18 X RAY DECT (DISPOSABLE) ×6 IMPLANT
SPONGE TONSIL 1.25 RF SGL STRG (GAUZE/BANDAGES/DRESSINGS) ×3 IMPLANT
STAPLER VISISTAT 35W (STAPLE) IMPLANT
SUCTION POOLE TIP (SUCTIONS) IMPLANT
SURGIFLO W/THROMBIN 8M KIT (HEMOSTASIS) ×3 IMPLANT
SUT CHROMIC 3 0 SH 27 (SUTURE) IMPLANT
SUT ETHILON 3 0 PS 1 (SUTURE) IMPLANT
SUT PROLENE 3 0 SH DA (SUTURE) IMPLANT
SUT PROLENE 4 0 RB 1 (SUTURE)
SUT PROLENE 4-0 RB1 .5 CRCL 36 (SUTURE) IMPLANT
SUT SILK  1 MH (SUTURE) ×4
SUT SILK 1 MH (SUTURE) ×2 IMPLANT
SUT SILK 1 TIES 10X30 (SUTURE) IMPLANT
SUT SILK 2 0SH CR/8 30 (SUTURE) IMPLANT
SUT SILK 3 0SH CR/8 30 (SUTURE) IMPLANT
SUT VIC AB 1 CTX 18 (SUTURE) ×9 IMPLANT
SUT VIC AB 1 CTX 36 (SUTURE) ×2
SUT VIC AB 1 CTX36XBRD ANBCTR (SUTURE) ×1 IMPLANT
SUT VIC AB 2 TP1 27 (SUTURE) IMPLANT
SUT VIC AB 2-0 CT1 27 (SUTURE)
SUT VIC AB 2-0 CT1 TAPERPNT 27 (SUTURE) IMPLANT
SUT VIC AB 2-0 CTX 27 (SUTURE) ×3 IMPLANT
SUT VIC AB 2-0 CTX 36 (SUTURE) IMPLANT
SUT VIC AB 3-0 MH 27 (SUTURE) IMPLANT
SUT VIC AB 3-0 SH 18 (SUTURE) ×3 IMPLANT
SUT VIC AB 3-0 SH 27 (SUTURE)
SUT VIC AB 3-0 SH 27X BRD (SUTURE) IMPLANT
SUT VIC AB 3-0 X1 27 (SUTURE) ×3 IMPLANT
SUT VICRYL 0 UR6 27IN ABS (SUTURE) ×3 IMPLANT
SUT VICRYL 2 TP 1 (SUTURE) IMPLANT
SUT VICRYL 4-0 PS2 18IN ABS (SUTURE) IMPLANT
SWAB COLLECTION DEVICE MRSA (MISCELLANEOUS) IMPLANT
SYSTEM SAHARA CHEST DRAIN ATS (WOUND CARE) ×3 IMPLANT
TAPE CLOTH SURG 6X10 WHT LF (GAUZE/BANDAGES/DRESSINGS) ×3 IMPLANT
TAPE UMBILICAL COTTON 1/8X30 (MISCELLANEOUS) ×3 IMPLANT
TOWEL OR 17X24 6PK STRL BLUE (TOWEL DISPOSABLE) ×3 IMPLANT
TOWEL OR 17X26 10 PK STRL BLUE (TOWEL DISPOSABLE) ×3 IMPLANT
TRAP SPECIMEN MUCOUS 40CC (MISCELLANEOUS) IMPLANT
TRAY FOLEY CATH 16FRSI W/METER (SET/KITS/TRAYS/PACK) ×3 IMPLANT
TUBE ANAEROBIC SPECIMEN COL (MISCELLANEOUS) IMPLANT
WATER STERILE IRR 1000ML POUR (IV SOLUTION) ×6 IMPLANT

## 2016-02-20 NOTE — Progress Notes (Signed)
Patient ID: [name], male   DOB: Feb 01, 1976, 40 y.o.   MRN: [id number]  SICU Evening Rounds:  Hemodynamically stable after repair of flail chest this afternoon.  Chest tube output low.  CBC    Component Value Date/Time   WBC 7.4 02/20/2016 0415   RBC 2.59* 02/20/2016 0415   HGB 9.2* 02/20/2016 1818   HCT 27.0* 02/20/2016 1818   PLT 191 02/20/2016 0415   MCV 95.0 02/20/2016 0415   MCH 31.7 02/20/2016 0415   MCHC 33.3 02/20/2016 0415   RDW 12.7 02/20/2016 0415   LYMPHSABS 0.8 02/20/2016 0415   MONOABS 0.8 02/20/2016 0415   EOSABS 0.2 02/20/2016 0415   BASOSABS 0.0 02/20/2016 0415
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
## 2016-02-20 NOTE — Anesthesia Postprocedure Evaluation (Signed)
Anesthesia Post Note  Patient: [name]  Procedure(s) Performed: Procedure(s) (LRB): LEFT RIB PLATING (Left)  Patient location during evaluation: SICU Anesthesia Type: General Level of consciousness: sedated and patient remains intubated per anesthesia plan Pain management: pain level controlled Vital Signs Assessment: post-procedure vital signs reviewed and stable Respiratory status: patient remains intubated per anesthesia plan and patient on ventilator - see flowsheet for VS Cardiovascular status: blood pressure returned to baseline Anesthetic complications: no    Last Vitals:  Filed Vitals:   02/20/16 1400 02/20/16 2000  BP: 122/59 132/65  Pulse: 74 90  Temp: 37.3 C 37.5 C  Resp: 24 24    Last Pain:  Filed Vitals:   02/20/16 2010  PainSc: 2                  [name] A

## 2016-02-20 NOTE — Transfer of Care (Signed)
Immediate Anesthesia Transfer of Care Note  Patient: [name]  Procedure(s) Performed: Procedure(s): LEFT RIB PLATING (Left)  Patient Location: SICU  Anesthesia Type:General  Level of Consciousness: Patient remains intubated per anesthesia plan  Airway & Oxygen Therapy: Patient remains intubated per anesthesia plan and Patient placed on Ventilator (see vital sign flow sheet for setting)  Post-op Assessment: Report given to RN and Post -op Vital signs reviewed and stable  Post vital signs: Reviewed and stable  Last Vitals:  Filed Vitals:   02/20/16 1300 02/20/16 1400  BP: 108/54 122/59  Pulse: 72 74  Temp: 37.1 C 37.3 C  Resp: 24 24    Last Pain:  Filed Vitals:   02/20/16 1418  PainSc: 2          Complications: No apparent anesthesia complications

## 2016-02-20 NOTE — Progress Notes (Signed)
RT informed by RN that arterial line not drawing back and not showing any waveform on monitor. RT attempted to get a-line to work but was unsuccessful. RR arterial line removed and pressure held x5 minutes. RN to inform MD a-line out.

## 2016-02-20 NOTE — Progress Notes (Signed)
4 Days Post-Op  Subjective: Going for sternal plating today. His wires seem tight. Lurline Idol is patent and no bleeding  Objective: Vital signs in last 24 hours: Temp:  [98.8 F (37.1 C)-100.8 F (38.2 C)] 100.1 F (37.8 C) (05/02 0753) Pulse Rate:  [73-124] 88 (05/02 0745) Resp:  [21-39] 23 (05/02 0745) BP: (103-181)/(49-107) 181/107 mmHg (05/02 0735) SpO2:  [75 %-100 %] 93 % (05/02 0745) Arterial Line BP: (105-195)/(46-102) 195/102 mmHg (05/02 0745) FiO2 (%):  [60 %-70 %] 60 % (05/02 0745) Weight:  [123.1 kg (271 lb 6.2 oz)] 123.1 kg (271 lb 6.2 oz) (05/02 0500)    Intake/Output from previous day: 05/01 0701 - 05/02 0700 In: 5632.1 [I.V.:4872.1; NG/GT:560; IV Piggyback:200] Out: [id number] [Urine:3220; Chest Tube:170] Intake/Output this shift:    wires appear tight and swelliing seems to be decreased. trach open and minimal secretions.   Lab Results:   Recent Labs  02/19/16 1745 02/20/16 0415  WBC 6.5 7.4  HGB 8.8* 8.2*  HCT 26.5* 24.6*  PLT 206 191   BMET  Recent Labs  02/19/16 1745 02/20/16 0415  NA 137 137  K 4.4 4.2  CL 101 101  CO2 28 28  GLUCOSE 121* 135*  BUN 7 7  CREATININE 0.77 0.72  CALCIUM 8.0* 8.0*   PT/INR  Recent Labs  02/19/16 1745  LABPROT 14.8  INR 1.14   ABG  Recent Labs  02/19/16 0955 02/19/16 1556  PHART 7.384 7.412  HCO3 29.8* 28.4*    Studies/Results: Dg Chest Port 1 View  02/20/2016  CLINICAL DATA:  Respiratory failure.  Rib and sternal fractures. EXAM: PORTABLE CHEST 1 VIEW COMPARISON:  One day prior FINDINGS: Right-sided PICC line is unchanged. Left-sided chest tube remains in place. Nasogastric extends beyond the inferior aspect of the film. Tracheostomy appropriately positioned. Cardiomegaly accentuated by AP portable technique. Small bilateral pleural effusions are similar. No pneumothorax. Low lung volumes with resultant pulmonary interstitial prominence. Minimal improvement in left and similar right base airspace disease.  Left rib fractures. IMPRESSION: Minimal improvement in left base atelectasis. Otherwise, similar appearance of the chest with layering pleural fluid and bibasilar airspace disease. Left chest tube in place, without pneumothorax. Electronically Signed   By: Abigail Miyamoto M.D.   On: 02/20/2016 07:46   Dg Chest Port 1 View  02/19/2016  CLINICAL DATA:  Trauma EXAM: PORTABLE CHEST 1 VIEW COMPARISON:  02/18/2016 FINDINGS: Left chest tube remains in place with small left pleural effusion. No pneumothorax. Tracheostomy tube and NG tube are unchanged. Multiple left rib fractures are again noted. Bibasilar atelectasis, similar on the left, slightly increased on the right. Suspect small right effusion. Heart is borderline in size. IMPRESSION: Support devices are stable. No pneumothorax. Stable left base atelectasis. Increasing right basilar atelectasis. Small bilateral effusions. Electronically Signed   By: Rolm Baptise M.D.   On: 02/19/2016 07:54    Anti-infectives: Anti-infectives    Start     Dose/Rate Route Frequency Ordered Stop   02/20/16 1600  piperacillin-tazobactam (ZOSYN) IVPB 3.375 g     3.375 g 12.5 mL/hr over 240 Minutes Intravenous Every 8 hours 02/20/16 0749     02/20/16 1300  cefUROXime (ZINACEF) 1.5 g in dextrose 5 % 50 mL IVPB     1.5 g 100 mL/hr over 30 Minutes Intravenous 60 min pre-op 02/19/16 1504     02/20/16 0800  piperacillin-tazobactam (ZOSYN) IVPB 3.375 g     3.375 g 100 mL/hr over 30 Minutes Intravenous  Once 02/20/16 0747  02/16/16 1500  clindamycin (CLEOCIN) IVPB 600 mg  Status:  Discontinued     600 mg 100 mL/hr over 30 Minutes Intravenous Every 6 hours 02/16/16 1420 02/20/16 0747   02/15/16 0144  ceFAZolin (ANCEF) 2-4 GM/100ML-% IVPB    Comments:  Albright, Cassandra : cabinet override      02/15/16 0144 02/15/16 1359   02/15/16 0100  ceFAZolin (ANCEF) IVPB 1 g/50 mL premix  Status:  Discontinued     1 g 100 mL/hr over 30 Minutes Intravenous Every 6 hours 02/15/16 0049  02/19/16 0754   02/15/16 0045  ceFAZolin (ANCEF) IVPB 2g/100 mL premix  Status:  Discontinued     2 g 200 mL/hr over 30 Minutes Intravenous Every 8 hours 02/15/16 0043 02/15/16 0236      Assessment/Plan: s/p Procedure(s): OPEN REDUCTION INTERNAL FIXATION (ORIF) MANDIBULAR FRACTURE (N/A) TRACHEOSTOMY (N/A) will leave wires on for about 2 weeks to allow occlusion to settle.   LOS: 5 days    Melissa Montane 02/20/2016
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
## 2016-02-20 NOTE — Anesthesia Preprocedure Evaluation (Addendum)
Anesthesia Evaluation  Patient identified by MRN, date of birth, ID bandGeneral Assessment Comment:sedated  Reviewed: Allergy & Precautions, NPO status , Patient's Chart, lab work & pertinent test results  History of Anesthesia Complications Negative for: history of anesthetic complications  Airway Mallampati: Trach       Dental   Wired:   Pulmonary Current Smoker,     + decreased breath sounds      Cardiovascular negative cardio ROS   Rhythm:Regular     Neuro/Psych negative neurological ROS  negative psych ROS   GI/Hepatic negative GI ROS,   Endo/Other  Morbid obesity  Renal/GU negative Renal ROS     Musculoskeletal   Abdominal   Peds  Hematology  (+) anemia ,   Anesthesia Other Findings   Reproductive/Obstetrics                           Anesthesia Physical Anesthesia Plan  ASA: IV  Anesthesia Plan: General   Post-op Pain Management:    Induction: Inhalational  Airway Management Planned: Tracheostomy  Additional Equipment: Arterial line  Intra-op Plan:   Post-operative Plan: Post-operative intubation/ventilation  Informed Consent: I have reviewed the patients History and Physical, chart, labs and discussed the procedure including the risks, benefits and alternatives for the proposed anesthesia with the patient or authorized representative who has indicated his/her understanding and acceptance.   Consent reviewed with POA  Plan Discussed with: CRNA, Anesthesiologist and Surgeon  Anesthesia Plan Comments:        Anesthesia Quick Evaluation

## 2016-02-20 NOTE — Progress Notes (Signed)
Pt taken to OR by CRNA being manually ventilated. Peep valve at 10. RT will continue to monitor.

## 2016-02-20 NOTE — Progress Notes (Signed)
The patient was examined and preop studies reviewed. There has been no change from the prior exam and the patient is ready for surgery.  plan Left rib plating on H [place]

## 2016-02-20 NOTE — Anesthesia Procedure Notes (Signed)
Date/Time: 02/20/2016 2:15 PM Performed by: Carney Living Patient Re-evaluated:Patient Re-evaluated prior to inductionOxygen Delivery Method: Circle system utilized Intubation Type: Inhalational induction, Tracheostomy and Inhalational induction with existing ETT Airway Equipment and Method: Tracheostomy Placement Confirmation: positive ETCO2 and breath sounds checked- equal and bilateral

## 2016-02-20 NOTE — Brief Op Note (Signed)
02/14/2016 - 02/20/2016  6:34 PM  PATIENT:  [name]  40 y.o. male  PRE-OPERATIVE DIAGNOSIS:  LEFT RIB FRACTURES STERNAL FRACTURE  POST-OPERATIVE DIAGNOSIS:  LEFT RIB FRACTURES STERNAL FRACTURE  PROCEDURE:  Procedure(s):  LEFT RIB PLATING (Left) -4th, 5th, 6th rib  SURGEON:  Surgeon(s) and Role:    * Ivin Poot, MD - Primary  PHYSICIAN ASSISTANT: Ellwood Handler PA-C  ANESTHESIA:   general  EBL:  Total I/O In: 3037.4 [I.V.:2237.4; Blood:670; NG/GT:30; IV Piggyback:100] Out: 4300 [Urine:3950; Blood:300; Chest Tube:50]  BLOOD ADMINISTERED:1U CC PRBC  DRAINS: chest tube left chest   LOCAL MEDICATIONS USED:  NONE  SPECIMEN:  No Specimen  DISPOSITION OF SPECIMEN:  N/A  COUNTS:  YES  TOURNIQUET:  * No tourniquets in log *  DICTATION: .Dragon Dictation  PLAN OF CARE: Admit to inpatient   PATIENT DISPOSITION:  ICU - intubated and hemodynamically stable.   Delay start of Pharmacological VTE agent (>24hrs) due to surgical blood loss or risk of bleeding: yes

## 2016-02-21 ENCOUNTER — Inpatient Hospital Stay (HOSPITAL_COMMUNITY): Payer: Medicaid Other | Admitting: Anesthesiology

## 2016-02-21 ENCOUNTER — Inpatient Hospital Stay (HOSPITAL_COMMUNITY): Payer: Medicaid Other

## 2016-02-21 LAB — POCT I-STAT 3, ART BLOOD GAS (G3+)
Acid-Base Excess: 7 mmol/L — ABNORMAL HIGH (ref 0.0–2.0)
Bicarbonate: 32.2 mEq/L — ABNORMAL HIGH (ref 20.0–24.0)
O2 Saturation: 98 %
PCO2 ART: 47 mmHg — AB (ref 35.0–45.0)
PH ART: 7.444 (ref 7.350–7.450)
TCO2: 34 mmol/L (ref 0–100)
pO2, Arterial: 109 mmHg — ABNORMAL HIGH (ref 80.0–100.0)

## 2016-02-21 LAB — CBC WITH DIFFERENTIAL/PLATELET
BASOS PCT: 0 %
Basophils Absolute: 0 10*3/uL (ref 0.0–0.1)
EOS ABS: 0.3 10*3/uL (ref 0.0–0.7)
EOS PCT: 4 %
HCT: 27.7 % — ABNORMAL LOW (ref 39.0–52.0)
Hemoglobin: 9.3 g/dL — ABNORMAL LOW (ref 13.0–17.0)
LYMPHS ABS: 0.8 10*3/uL (ref 0.7–4.0)
Lymphocytes Relative: 11 %
MCH: 31.5 pg (ref 26.0–34.0)
MCHC: 33.6 g/dL (ref 30.0–36.0)
MCV: 93.9 fL (ref 78.0–100.0)
MONO ABS: 0.8 10*3/uL (ref 0.1–1.0)
MONOS PCT: 11 %
NEUTROS PCT: 74 %
Neutro Abs: 5.9 10*3/uL (ref 1.7–7.7)
PLATELETS: 211 10*3/uL (ref 150–400)
RBC: 2.95 MIL/uL — ABNORMAL LOW (ref 4.22–5.81)
RDW: 14 % (ref 11.5–15.5)
WBC: 7.9 10*3/uL (ref 4.0–10.5)

## 2016-02-21 LAB — GLUCOSE, CAPILLARY
GLUCOSE-CAPILLARY: 124 mg/dL — AB (ref 65–99)
GLUCOSE-CAPILLARY: 144 mg/dL — AB (ref 65–99)
GLUCOSE-CAPILLARY: 126 mg/dL — AB (ref 65–99)
Glucose-Capillary: 118 mg/dL — ABNORMAL HIGH (ref 65–99)
Glucose-Capillary: 127 mg/dL — ABNORMAL HIGH (ref 65–99)

## 2016-02-21 LAB — BASIC METABOLIC PANEL
Anion gap: 9 (ref 5–15)
BUN: 10 mg/dL (ref 6–20)
CALCIUM: 7.7 mg/dL — AB (ref 8.9–10.3)
CO2: 29 mmol/L (ref 22–32)
CREATININE: 0.8 mg/dL (ref 0.61–1.24)
Chloride: 101 mmol/L (ref 101–111)
GFR calc non Af Amer: >60 mL/min (ref >60)
Glucose, Bld: 132 mg/dL — ABNORMAL HIGH (ref 65–99)
Potassium: 4.3 mmol/L (ref 3.5–5.1)
SODIUM: 139 mmol/L (ref 135–145)

## 2016-02-21 MED ORDER — VECURONIUM BROMIDE 10 MG IV SOLR
INTRAVENOUS | Status: AC
  Administered 2016-02-21: 10 mg
  Filled 2016-02-21: qty 10

## 2016-02-21 MED ORDER — LIDOCAINE-EPINEPHRINE (PF) 1.5 %-1:200000 IJ SOLN
INTRAMUSCULAR | Status: DC | PRN
  Administered 2016-02-21 (×2): 2 mg via EPIDURAL
  Administered 2016-02-21 (×2): 3 mg via EPIDURAL

## 2016-02-21 MED ORDER — ROPIVACAINE HCL 2 MG/ML IJ SOLN
12.0000 mL/h | INTRAMUSCULAR | Status: DC
  Administered 2016-02-21 – 2016-02-22 (×2): 6 mL/h via EPIDURAL
  Filled 2016-02-21 (×5): qty 200

## 2016-02-21 NOTE — Progress Notes (Signed)
1 Day Post-Op Procedure(s) (LRB): LEFT RIB PLATING (Left) Subjective: Stable after 3 rib titanium plate fixation Chest x-ray satisfactory Minimal chest tube output or airleak Lungs with coarse breath sounds Comfortable on sedation  Objective: Vital signs in last 24 hours: Temp:  [98.8 F (37.1 C)-101.5 F (38.6 C)] 100.8 F (38.2 C) (05/03 0900) Pulse Rate:  [72-115] 106 (05/03 0900) Cardiac Rhythm:  [-] Normal sinus rhythm (05/03 0800) Resp:  [5-35] 24 (05/03 0900) BP: (100-180)/(54-120) 108/57 mmHg (05/03 0900) SpO2:  [91 %-100 %] 97 % (05/03 0900) Arterial Line BP: (120-209)/(52-91) 120/52 mmHg (05/03 0900) FiO2 (%):  [60 %-100 %] 60 % (05/03 0900) Weight:  [274 lb 7.6 oz (124.5 kg)] 274 lb 7.6 oz (124.5 kg) (05/03 0600)  Hemodynamic parameters for last 24 hours:  persistent fever for gram-negative pneumonia-Acinetobacter on IV Zosyn  Intake/Output from previous day: 05/02 0701 - 05/03 0700 In: 5643.8 [I.V.:4443.8; Blood:670; NG/GT:30; IV [id number] Out: [id number] [Urine:5040; Blood:300; Chest Tube:280] Intake/Output this shift: Total I/O In: 493.4 [I.V.:363.4; NG/GT:30; IV Piggyback:100] Out: 150 [Urine:150]  Bretht sounds coarse  Lab Results:  Recent Labs  02/20/16 0415  02/20/16 1818 02/21/16 0445  WBC 7.4  --   --  7.9  HGB 8.2*  < > 9.2* 9.3*  HCT 24.6*  < > 27.0* 27.7*  PLT 191  --   --  211  < > = values in this interval not displayed. BMET:  Recent Labs  02/20/16 0415  02/20/16 1818 02/21/16 0445  NA 137  < > 137 139  K 4.2  < > 4.0 4.3  CL 101  --   --  101  CO2 28  --   --  29  GLUCOSE 135*  < > 137* 132*  BUN 7  --   --  10  CREATININE 0.72  --   --  0.80  CALCIUM 8.0*  --   --  7.7*  < > = values in this interval not displayed.  PT/INR:  Recent Labs  02/19/16 1745  LABPROT 14.8  INR 1.14   ABG    Component Value Date/Time   PHART 7.444 02/21/2016 0444   HCO3 32.2* 02/21/2016 0444   TCO2 34 02/21/2016 0444   ACIDBASEDEF 1.0  02/17/2016 0435   O2SAT 98.0 02/21/2016 0444   CBG (last 3)   Recent Labs  02/20/16 2356 02/21/16 0431 02/21/16 0804  GLUCAP 127* 124* 144*    Assessment/Plan: S/P Procedure(s) (LRB): LEFT RIB PLATING (Left) Leave chest tube suction a day-probable water seal tomorrow Chest x-ray in a.m.   LOS: 6 days    Tharon Aquas Trigt III 02/21/2016

## 2016-02-21 NOTE — Anesthesia Preprocedure Evaluation (Signed)
Anesthesia Evaluation  Patient identified by MRN, date of birth, ID bandGeneral Assessment Comment:Patient sedated  Reviewed: Allergy & Precautions, NPO status , Patient's Chart, lab work & pertinent test results  History of Anesthesia Complications Negative for: history of anesthetic complications  Airway Mallampati: Trach       Dental   Wired:   Pulmonary Current Smoker,  trached after mandible fracture and rib fractures with flail segment plated 5/2  Left chest tube, s/p left rib plating, right and left rib fractures  + rhonchi        Cardiovascular negative cardio ROS   Rhythm:Regular Rate:Tachycardia     Neuro/Psych negative neurological ROS  negative psych ROS   GI/Hepatic negative GI ROS, Neg liver ROS,   Endo/Other  Morbid obesity  Renal/GU negative Renal ROS     Musculoskeletal   Abdominal   Peds  Hematology negative hematology ROS (+)   Anesthesia Other Findings   Reproductive/Obstetrics                             Anesthesia Physical Anesthesia Plan  ASA: III  Anesthesia Plan: Epidural   Post-op Pain Management:    Induction:   Airway Management Planned:   Additional Equipment:   Intra-op Plan:   Post-operative Plan:   Informed Consent: I have reviewed the patients History and Physical, chart, labs and discussed the procedure including the risks, benefits and alternatives for the proposed anesthesia with the patient or authorized representative who has indicated his/her understanding and acceptance.   Consent reviewed with POA  Plan Discussed with: Anesthesiologist  Anesthesia Plan Comments:         Anesthesia Quick Evaluation

## 2016-02-21 NOTE — Anesthesia Procedure Notes (Signed)
Epidural Patient location during procedure: ICU  Staffing Anesthesiologist: Suman Trivedi Performed by: anesthesiologist   Preanesthetic Checklist Completed: patient identified, surgical consent, pre-op evaluation, timeout performed, IV checked, risks and benefits discussed and monitors and equipment checked  Epidural Patient position: right lateral decubitus Prep: DuraPrep Patient monitoring: heart rate, cardiac monitor, continuous pulse ox and blood pressure Approach: midline Location: L3-L4 (T5/6) Injection technique: LOR saline  Needle:  Needle type: Tuohy  Needle gauge: 17 G Needle length: 9 cm Needle insertion depth: 8 cm Catheter type: closed end flexible Catheter size: 19 Gauge Catheter at skin depth: 14 cm Test dose: negative and 1.5% lidocaine with Epi 1:200 K  Assessment Events: blood not aspirated, injection not painful, no injection resistance, negative IV test and no paresthesia  Additional Notes Patient trached and connected to vent, sedated to establish position, T5/6 epidural on second pass with LOR at 8, catheter threaded easily without resistance, negative aspiration, easy injection, HR and BP decrease overtime as expected, will attempt to wean sedation to evaluate benefit and function. Reason for block:procedure for pain

## 2016-02-21 NOTE — Progress Notes (Signed)
Follow up - Trauma and Critical Care  Patient Details:    Ballard Thibodeaux is an 40 y.o. male.  Lines/tubes : PICC Triple Lumen [id number] PICC Right Basilic 43 cm 0 cm (Active)  Indication for Insertion or Continuance of Line Chronic illness with exacerbations (CF, Sickle Cell, etc.);Head or chest injuries (Tracheotomy, burns, open chest wounds);Prolonged intravenous therapies 02/20/2016  8:00 PM  Exposed Catheter (cm) 0 cm 02/19/2016 12:00 PM  Site Assessment Clean;Dry;Intact 02/20/2016  8:00 PM  Lumen #1 Status Infusing 02/20/2016  8:00 PM  Lumen #2 Status Infusing 02/20/2016  8:00 PM  Lumen #3 Status Infusing 02/20/2016  8:00 PM  Dressing Type Transparent;Occlusive 02/20/2016  8:00 PM  Dressing Status Clean;Dry;Intact;Antimicrobial disc in place 02/20/2016  8:00 PM  Line Care Connections checked and tightened 02/20/2016  8:00 PM  Line Adjustment (NICU/IV Team Only) No 02/17/2016 11:38 AM  Dressing Intervention New dressing 02/17/2016 11:38 AM  Dressing Change Due 02/24/16 02/20/2016  8:00 PM     Arterial Line 02/20/16 Left Radial (Active)  Site Assessment Clean;Dry;Intact 02/20/2016  8:00 PM  Line Status Pulsatile blood flow 02/20/2016  8:00 PM  Art Line Waveform Appropriate 02/20/2016  8:00 PM  Art Line Interventions Zeroed and calibrated 02/20/2016  8:00 PM  Color/Movement/Sensation Capillary refill less than 3 sec 02/20/2016  8:00 PM  Dressing Type Transparent;Occlusive 02/20/2016  8:00 PM  Dressing Status Clean;Dry;Intact 02/20/2016  8:00 PM     Chest Tube 1 Left Pleural (Active)  Suction -20 cm H2O 02/20/2016  8:00 PM  Chest Tube Air Leak None 02/20/2016  8:00 PM  Patency Intervention Tip/tilt 02/20/2016  8:00 PM  Output (mL) 40 mL 02/21/2016  6:00 AM     NG/OG Tube Nasogastric 18 Fr. Right nare (Active)  Placement Verification Auscultation 02/20/2016  8:00 PM  Site Assessment Clean;Dry;Intact 02/20/2016  8:00 PM  Status Clamped 02/20/2016  8:00 PM  Amount of suction 45 mmHg 02/16/2016  2:20 PM  Drainage Appearance Tan  02/19/2016  8:00 PM  Intake (mL) 30 mL 02/20/2016  9:00 AM  Output (mL) 0 mL 02/19/2016  7:00 PM     Urethral Catheter ken emt  Temperature probe 18 Fr. (Active)  Indication for Insertion or Continuance of Catheter Unstable critical patients (first 24-48 hours);Peri-operative use for selective surgical procedure 02/20/2016  8:00 PM  Site Assessment Clean;Intact;Dry 02/20/2016  8:00 PM  Catheter Maintenance Bag below level of bladder;Catheter secured;Drainage bag/tubing not touching floor;Insertion date on drainage bag;No dependent loops;Seal intact 02/20/2016  8:00 PM  Collection Container Standard drainage bag 02/20/2016  8:00 PM  Securement Method Leg strap 02/20/2016  8:00 PM  Urinary Catheter Interventions Unclamped 02/20/2016  8:00 PM  Output (mL) 50 mL 02/21/2016  6:00 AM    Microbiology/Sepsis markers: Results for orders placed or performed during the hospital encounter of 02/14/16  MRSA PCR Screening     Status: None   Collection Time: 02/15/16  3:02 AM  Result Value Ref Range Status   MRSA by PCR NEGATIVE NEGATIVE Final    Comment:        The GeneXpert MRSA Assay (FDA approved for NASAL specimens only), is one component of a comprehensive MRSA colonization surveillance program. It is not intended to diagnose MRSA infection nor to guide or monitor treatment for MRSA infections.   Culture, blood (Routine X 2) w Reflex to ID Panel     Status: None (Preliminary result)   Collection Time: 02/17/16  3:37 PM  Result Value Ref Range Status   Specimen  Description BLOOD BLOOD LEFT FOREARM  Final   Special Requests BOTTLES DRAWN AEROBIC AND ANAEROBIC 5CC  Final   Culture NO GROWTH 3 DAYS  Final   Report Status PENDING  Incomplete  Culture, blood (Routine X 2) w Reflex to ID Panel     Status: None (Preliminary result)   Collection Time: 02/17/16  3:39 PM  Result Value Ref Range Status   Specimen Description BLOOD BLOOD LEFT FOREARM  Final   Special Requests BOTTLES DRAWN AEROBIC AND ANAEROBIC 5CC   Final   Culture NO GROWTH 3 DAYS  Final   Report Status PENDING  Incomplete  Culture, respiratory (NON-Expectorated)     Status: None   Collection Time: 02/17/16  3:52 PM  Result Value Ref Range Status   Specimen Description TRACHEAL ASPIRATE  Final   Special Requests Normal  Final   Gram Stain   Final    FEW WBC RARE SQUAMOUS EPITHELIAL CELLS PRESENT FEW GRAM POSITIVE COCCI IN PAIRS Performed at [facility]    Culture   Final    ABUNDANT ACINETOBACTER LWOFFII Performed at [facility]    Report Status 02/20/2016 FINAL  Final   Organism ID, Bacteria ACINETOBACTER LWOFFII  Final      Susceptibility   Acinetobacter lwoffii - MIC*    AMPICILLIN >=32 RESISTANT Resistant     AMPICILLIN/SULBACTAM <=2 SENSITIVE Sensitive     CEFAZOLIN >=64 RESISTANT Resistant     CEFEPIME 4 SENSITIVE Sensitive     CEFTAZIDIME 16 INTERMEDIATE Intermediate     CEFTRIAXONE 8 SENSITIVE Sensitive     CIPROFLOXACIN 0.5 SENSITIVE Sensitive     GENTAMICIN <=1 SENSITIVE Sensitive     IMIPENEM <=0.25 SENSITIVE Sensitive     PIP/TAZO >=128 RESISTANT Resistant     TOBRAMYCIN 8 INTERMEDIATE Intermediate     TRIMETH/SULFA Value in next row Sensitive      <=20 SENSITIVE(NOTE)    * ABUNDANT ACINETOBACTER LWOFFII    Anti-infectives:  Anti-infectives    Start     Dose/Rate Route Frequency Ordered Stop   02/21/16 0500  vancomycin (VANCOCIN) IVPB 1000 mg/200 mL premix     1,000 mg 200 mL/hr over 60 Minutes Intravenous Every 12 hours 02/20/16 1944 02/22/16 0459   02/20/16 1804  vancomycin (VANCOCIN) 1,000 mg in sodium chloride 0.9 % 1,000 mL irrigation  Status:  Discontinued       As needed 02/20/16 1804 02/20/16 1909   02/20/16 1745  vancomycin (VANCOCIN) 1,000 mg in sodium chloride 0.9 % 1,000 mL irrigation      Irrigation To Surgery 02/20/16 1736 02/21/16 1745   02/20/16 1600  piperacillin-tazobactam (ZOSYN) IVPB 3.375 g  Status:  Discontinued     3.375 g 12.5 mL/hr over 240 Minutes  Intravenous Every 8 hours 02/20/16 0749 02/20/16 0810   02/20/16 1530  vancomycin (VANCOCIN) 1,000 mg in sodium chloride 0.9 % 250 mL IVPB  Status:  Discontinued     1,000 mg 250 mL/hr over 60 Minutes Intravenous Every 12 hours 02/20/16 1516 02/20/16 1530   02/20/16 1300  cefUROXime (ZINACEF) 1.5 g in dextrose 5 % 50 mL IVPB  Status:  Discontinued     1.5 g 100 mL/hr over 30 Minutes Intravenous 60 min pre-op 02/19/16 1504 02/20/16 0806   02/20/16 0830  levofloxacin (LEVAQUIN) IVPB 750 mg  Status:  Discontinued     750 mg 100 mL/hr over 90 Minutes Intravenous Every 24 hours 02/20/16 0812 02/20/16 0822   02/20/16 0830  Ampicillin-Sulbactam (UNASYN) 3 g  in sodium chloride 0.9 % 100 mL IVPB     3 g 100 mL/hr over 60 Minutes Intravenous Every 6 hours 02/20/16 0825     02/20/16 0800  piperacillin-tazobactam (ZOSYN) IVPB 3.375 g  Status:  Discontinued     3.375 g 100 mL/hr over 30 Minutes Intravenous  Once 02/20/16 0747 02/20/16 0810   02/16/16 1500  clindamycin (CLEOCIN) IVPB 600 mg  Status:  Discontinued     600 mg 100 mL/hr over 30 Minutes Intravenous Every 6 hours 02/16/16 1420 02/20/16 0747   02/15/16 0144  ceFAZolin (ANCEF) 2-4 GM/100ML-% IVPB    Comments:  Albright, Cassandra : cabinet override      02/15/16 0144 02/15/16 1359   02/15/16 0100  ceFAZolin (ANCEF) IVPB 1 g/50 mL premix  Status:  Discontinued     1 g 100 mL/hr over 30 Minutes Intravenous Every 6 hours 02/15/16 0049 02/19/16 0754   02/15/16 0045  ceFAZolin (ANCEF) IVPB 2g/100 mL premix  Status:  Discontinued     2 g 200 mL/hr over 30 Minutes Intravenous Every 8 hours 02/15/16 0043 02/15/16 0236      Best Practice/Protocols:  VTE Prophylaxis: Mechanical GI Prophylaxis: Proton Pump Inhibitor Continous Sedation  Consults: Treatment Team:  Melissa Montane, MD Ivin Poot, MD    Events:  Subjective:    Overnight Issues: Patient did fine overnight after plating of multiple left sided rib fractures yesterday.  Still  heavily sedated and getting fentanyl for pain control  Objective:  Vital signs for last 24 hours: Temp:  [98.8 F (37.1 C)-101.5 F (38.6 C)] 100.2 F (37.9 C) (05/03 0700) Pulse Rate:  [72-115] 108 (05/03 0815) Resp:  [5-35] 24 (05/03 0815) BP: (100-180)/(54-120) 115/75 mmHg (05/03 0815) SpO2:  [91 %-100 %] 96 % (05/03 0817) Arterial Line BP: (128-209)/(54-91) 148/66 mmHg (05/03 0700) FiO2 (%):  [60 %-100 %] 60 % (05/03 0817) Weight:  [124.5 kg (274 lb 7.6 oz)] 124.5 kg (274 lb 7.6 oz) (05/03 0600)  Hemodynamic parameters for last 24 hours:    Intake/Output from previous day: 05/02 0701 - 05/03 0700 In: 5533.8 [I.V.:4333.8; Blood:670; NG/GT:30; IV Piggyback:500] Out: [id number] [Urine:4940; Blood:300; Chest Tube:280]  Intake/Output this shift:    Vent settings for last 24 hours: Vent Mode:  [-] PRVC FiO2 (%):  [60 %-100 %] 60 % Set Rate:  [24 bmp] 24 bmp Vt Set:  [600 mL] 600 mL PEEP:  [10 cmH20-12 cmH20] 10 cmH20 Plateau Pressure:  [28 cmH20-36 cmH20] 30 cmH20  Physical Exam:  General: no respiratory distress and grimaces Neuro: nonfocal exam and RASS -1 Resp: clear to auscultation bilaterally and CXR shows good position of left chest tube and improved aeration. CVS: regular rate and rhythm, S1, S2 normal, no murmur, click, rub or gallop and no SVT GI: regular rate and rhythm, S1, S2 normal, no murmur, click, rub or gallop and intermittent sinus tachycardia Extremities: edema 1+ and good palpable pulses  Results for orders placed or performed during the hospital encounter of 02/14/16 (from the past 24 hour(s))  Glucose, capillary     Status: Abnormal   Collection Time: 02/20/16 11:59 AM  Result Value Ref Range   Glucose-Capillary 138 (H) 65 - 99 mg/dL   Comment 1 Notify RN   Prepare RBC (crossmatch)     Status: None   Collection Time: 02/20/16  3:55 PM  Result Value Ref Range   Order Confirmation ORDER PROCESSED BY BLOOD BANK   I-STAT 4, (NA,K, GLUC, HGB,HCT)  Status: Abnormal   Collection Time: 02/20/16  4:02 PM  Result Value Ref Range   Sodium 138 135 - 145 mmol/L   Potassium 3.8 3.5 - 5.1 mmol/L   Glucose, Bld 129 (H) 65 - 99 mg/dL   HCT 26.0 (L) 39.0 - 52.0 %   Hemoglobin 8.8 (L) 13.0 - 17.0 g/dL  I-STAT 4, (NA,K, GLUC, HGB,HCT)     Status: Abnormal   Collection Time: 02/20/16  6:18 PM  Result Value Ref Range   Sodium 137 135 - 145 mmol/L   Potassium 4.0 3.5 - 5.1 mmol/L   Glucose, Bld 137 (H) 65 - 99 mg/dL   HCT 27.0 (L) 39.0 - 52.0 %   Hemoglobin 9.2 (L) 13.0 - 17.0 g/dL  Glucose, capillary     Status: Abnormal   Collection Time: 02/20/16 11:56 PM  Result Value Ref Range   Glucose-Capillary 127 (H) 65 - 99 mg/dL   Comment 1 Notify RN   Glucose, capillary     Status: Abnormal   Collection Time: 02/21/16  4:31 AM  Result Value Ref Range   Glucose-Capillary 124 (H) 65 - 99 mg/dL   Comment 1 Notify RN   I-STAT 3, arterial blood gas (G3+)     Status: Abnormal   Collection Time: 02/21/16  4:44 AM  Result Value Ref Range   pH, Arterial 7.444 7.350 - 7.450   pCO2 arterial 47.0 (H) 35.0 - 45.0 mmHg   pO2, Arterial 109.0 (H) 80.0 - 100.0 mmHg   Bicarbonate 32.2 (H) 20.0 - 24.0 mEq/L   TCO2 34 0 - 100 mmol/L   O2 Saturation 98.0 %   Acid-Base Excess 7.0 (H) 0.0 - 2.0 mmol/L   Patient temperature 98.6 F    Collection site ARTERIAL LINE    Drawn by RT    Sample type ARTERIAL   CBC with Differential/Platelet     Status: Abnormal   Collection Time: 02/21/16  4:45 AM  Result Value Ref Range   WBC 7.9 4.0 - 10.5 K/uL   RBC 2.95 (L) 4.22 - 5.81 MIL/uL   Hemoglobin 9.3 (L) 13.0 - 17.0 g/dL   HCT 27.7 (L) 39.0 - 52.0 %   MCV 93.9 78.0 - 100.0 fL   MCH 31.5 26.0 - 34.0 pg   MCHC 33.6 30.0 - 36.0 g/dL   RDW 14.0 11.5 - 15.5 %   Platelets 211 150 - 400 K/uL   Neutrophils Relative % 74 %   Neutro Abs 5.9 1.7 - 7.7 K/uL   Lymphocytes Relative 11 %   Lymphs Abs 0.8 0.7 - 4.0 K/uL   Monocytes Relative 11 %   Monocytes Absolute 0.8 0.1 -  1.0 K/uL   Eosinophils Relative 4 %   Eosinophils Absolute 0.3 0.0 - 0.7 K/uL   Basophils Relative 0 %   Basophils Absolute 0.0 0.0 - 0.1 K/uL  Basic metabolic panel     Status: Abnormal   Collection Time: 02/21/16  4:45 AM  Result Value Ref Range   Sodium 139 135 - 145 mmol/L   Potassium 4.3 3.5 - 5.1 mmol/L   Chloride 101 101 - 111 mmol/L   CO2 29 22 - 32 mmol/L   Glucose, Bld 132 (H) 65 - 99 mg/dL   BUN 10 6 - 20 mg/dL   Creatinine, Ser 0.80 0.61 - 1.24 mg/dL   Calcium 7.7 (L) 8.9 - 10.3 mg/dL   GFR calc non Af Amer >60 >60 mL/min   GFR calc Af Amer >60 >60  mL/min   Anion gap 9 5 - 15     Assessment/Plan:   NEURO  Altered Mental Status:  sedation   Plan: Will lighten sedation more when we are able to ventilate him better over the next 12-24 hours  PULM  Atelectasis/collapse (focal)   Plan: Left sided atelectasis.  Acinetobacter PNA  CARDIO  Sinus Tachycardia   Plan: No specific treatment  RENAL  Urine output and renal function are good.   Plan: Will give some more lasix this AM  GI  No specific problems   Plan: Restart tube feedings.  ID  Pneumonia (hospital acquired (not ventilator-associated) Acinetobacter pneumonia)   Plan: On appropriate antibiotics.  Also on contact precaution  HEME  Anemia acute blood loss anemia)   Plan: No blood needed.  ENDO No specific issues   Plan: CPM  Global Issues  Patient is being treated for Acinetobacter PNA.  Seems to be improving.  CXR looks good.  Responded well to Lasix yesterday.  Nutritionally will need to resume tube feedings.  Slow wean over the week/weekend.    LOS: 6 days   Additional comments:I reviewed the patient's new clinical lab test results. cbc/bmet and I reviewed the patients new imaging test results. cxr  Critical Care Total Time*: 30 Minutes  Glendoris Nodarse 02/21/2016  *Care during the described time interval was provided by me and/or other providers on the critical care team.  I have reviewed this  patient's available data, including medical history, events of note, physical examination and test results as part of my evaluation.
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
## 2016-02-22 ENCOUNTER — Inpatient Hospital Stay (HOSPITAL_COMMUNITY): Payer: Medicaid Other

## 2016-02-22 ENCOUNTER — Encounter (HOSPITAL_COMMUNITY): Payer: Self-pay | Admitting: Cardiothoracic Surgery

## 2016-02-22 ENCOUNTER — Encounter (HOSPITAL_COMMUNITY): Payer: Self-pay | Admitting: *Deleted

## 2016-02-22 LAB — BLOOD GAS, ARTERIAL
Acid-Base Excess: 5.1 mmol/L — ABNORMAL HIGH (ref 0.0–2.0)
Bicarbonate: 28.7 mEq/L — ABNORMAL HIGH (ref 20.0–24.0)
DRAWN BY: 36496
FIO2: 0.5
MECHVT: 600 mL
O2 Saturation: 98.7 %
PEEP: 10 cmH2O
Patient temperature: 98.6
RATE: 24 resp/min
TCO2: 29.9 mmol/L (ref 0–100)
pCO2 arterial: 39.4 mmHg (ref 35.0–45.0)
pH, Arterial: 7.475 — ABNORMAL HIGH (ref 7.350–7.450)
pO2, Arterial: 101 mmHg — ABNORMAL HIGH (ref 80.0–100.0)

## 2016-02-22 LAB — CULTURE, BLOOD (ROUTINE X 2)
CULTURE: NO GROWTH
CULTURE: NO GROWTH

## 2016-02-22 LAB — GLUCOSE, CAPILLARY
GLUCOSE-CAPILLARY: 112 mg/dL — AB (ref 65–99)
GLUCOSE-CAPILLARY: 98 mg/dL (ref 65–99)
Glucose-Capillary: 121 mg/dL — ABNORMAL HIGH (ref 65–99)
Glucose-Capillary: 121 mg/dL — ABNORMAL HIGH (ref 65–99)
Glucose-Capillary: 148 mg/dL — ABNORMAL HIGH (ref 65–99)
Glucose-Capillary: 119 mg/dL — ABNORMAL HIGH (ref 65–99)

## 2016-02-22 LAB — COMPREHENSIVE METABOLIC PANEL
ALBUMIN: 1.7 g/dL — AB (ref 3.5–5.0)
ALK PHOS: 39 U/L (ref 38–126)
ALT: 26 U/L (ref 17–63)
AST: 41 U/L (ref 15–41)
Anion gap: 10 (ref 5–15)
BUN: 14 mg/dL (ref 6–20)
CALCIUM: 7.8 mg/dL — AB (ref 8.9–10.3)
CO2: 27 mmol/L (ref 22–32)
Chloride: 103 mmol/L (ref 101–111)
Creatinine, Ser: 0.64 mg/dL (ref 0.61–1.24)
GFR calc Af Amer: >60 mL/min (ref >60)
GLUCOSE: 102 mg/dL — AB (ref 65–99)
POTASSIUM: 3.8 mmol/L (ref 3.5–5.1)
Sodium: 140 mmol/L (ref 135–145)
Total Bilirubin: 0.5 mg/dL (ref 0.3–1.2)
Total Protein: 4.9 g/dL — ABNORMAL LOW (ref 6.5–8.1)

## 2016-02-22 LAB — POCT I-STAT 3, ART BLOOD GAS (G3+)
Acid-Base Excess: 4 mmol/L — ABNORMAL HIGH (ref 0.0–2.0)
BICARBONATE: 29.1 meq/L — AB (ref 20.0–24.0)
O2 Saturation: 96 %
PCO2 ART: 45.9 mmHg — AB (ref 35.0–45.0)
PH ART: 7.409 (ref 7.350–7.450)
TCO2: 30 mmol/L (ref 0–100)
pO2, Arterial: 83 mmHg (ref 80.0–100.0)

## 2016-02-22 LAB — CBC
HCT: 25.4 % — ABNORMAL LOW (ref 39.0–52.0)
Hemoglobin: 8.1 g/dL — ABNORMAL LOW (ref 13.0–17.0)
MCH: 30.5 pg (ref 26.0–34.0)
MCHC: 31.9 g/dL (ref 30.0–36.0)
MCV: 95.5 fL (ref 78.0–100.0)
Platelets: 225 10*3/uL (ref 150–400)
RBC: 2.66 MIL/uL — ABNORMAL LOW (ref 4.22–5.81)
RDW: 13.8 % (ref 11.5–15.5)
WBC: 7 10*3/uL (ref 4.0–10.5)

## 2016-02-22 MED ORDER — VECURONIUM BROMIDE 10 MG IV SOLR
0.8000 ug/kg/min | INTRAVENOUS | Status: DC
  Administered 2016-02-22: 1 ug/kg/min via INTRAVENOUS
  Administered 2016-02-23 (×2): 1.7 ug/kg/min via INTRAVENOUS
  Filled 2016-02-22 (×3): qty 100

## 2016-02-22 MED ORDER — IPRATROPIUM-ALBUTEROL 0.5-2.5 (3) MG/3ML IN SOLN
3.0000 mL | Freq: Three times a day (TID) | RESPIRATORY_TRACT | Status: DC
  Administered 2016-02-22 – 2016-03-02 (×28): 3 mL via RESPIRATORY_TRACT
  Filled 2016-02-22 (×28): qty 3

## 2016-02-22 MED ORDER — DEXMEDETOMIDINE HCL IN NACL 200 MCG/50ML IV SOLN
0.4000 ug/kg/h | INTRAVENOUS | Status: DC
  Administered 2016-02-22 (×2): 0.7 ug/kg/h via INTRAVENOUS
  Filled 2016-02-22 (×2): qty 50

## 2016-02-22 MED ORDER — FENTANYL BOLUS VIA INFUSION
50.0000 ug | INTRAVENOUS | Status: DC | PRN
  Administered 2016-02-26 – 2016-02-29 (×14): 50 ug via INTRAVENOUS
  Filled 2016-02-22: qty 50

## 2016-02-22 MED ORDER — SENNOSIDES 8.8 MG/5ML PO SYRP
5.0000 mL | ORAL_SOLUTION | Freq: Every day | ORAL | Status: DC
  Administered 2016-02-22 – 2016-03-04 (×8): 5 mL
  Filled 2016-02-22 (×12): qty 5

## 2016-02-22 MED ORDER — ADULT MULTIVITAMIN LIQUID CH
15.0000 mL | Freq: Every day | ORAL | Status: DC
  Administered 2016-02-22 – 2016-03-04 (×11): 15 mL
  Filled 2016-02-22 (×12): qty 15

## 2016-02-22 MED ORDER — DEXMEDETOMIDINE HCL IN NACL 400 MCG/100ML IV SOLN
0.4000 ug/kg/h | INTRAVENOUS | Status: DC
  Administered 2016-02-22: 0.7 ug/kg/h via INTRAVENOUS
  Filled 2016-02-22 (×3): qty 100

## 2016-02-22 MED ORDER — MIDAZOLAM HCL 2 MG/2ML IJ SOLN
4.0000 mg | Freq: Once | INTRAMUSCULAR | Status: AC
  Administered 2016-02-22: 4 mg via INTRAVENOUS

## 2016-02-22 MED ORDER — HALOPERIDOL LACTATE 5 MG/ML IJ SOLN
5.0000 mg | Freq: Four times a day (QID) | INTRAMUSCULAR | Status: DC | PRN
  Administered 2016-02-22: 5 mg via INTRAVENOUS
  Filled 2016-02-22: qty 1

## 2016-02-22 MED ORDER — VECURONIUM BROMIDE 10 MG IV SOLR
INTRAVENOUS | Status: AC
  Administered 2016-02-22: 10 mg
  Filled 2016-02-22: qty 10

## 2016-02-22 MED ORDER — IOPAMIDOL (ISOVUE-370) INJECTION 76%
INTRAVENOUS | Status: AC
  Administered 2016-02-22: 90 mL
  Filled 2016-02-22: qty 100

## 2016-02-22 MED ORDER — KCL IN DEXTROSE-NACL 20-5-0.45 MEQ/L-%-% IV SOLN
INTRAVENOUS | Status: DC
  Administered 2016-02-22 – 2016-02-24 (×2): via INTRAVENOUS
  Administered 2016-02-25 – 2016-02-26 (×2): 75 mL/h via INTRAVENOUS
  Administered 2016-02-26: 15:00:00 via INTRAVENOUS
  Administered 2016-02-27: 75 mL/h via INTRAVENOUS
  Administered 2016-02-28: 02:00:00 via INTRAVENOUS
  Filled 2016-02-22 (×13): qty 1000

## 2016-02-22 MED ORDER — SODIUM CHLORIDE 0.9 % IV SOLN
100.0000 ug/h | INTRAVENOUS | Status: DC
  Administered 2016-02-22: 300 ug/h via INTRAVENOUS
  Administered 2016-02-23 – 2016-02-25 (×9): 350 ug/h via INTRAVENOUS
  Administered 2016-02-25: 250 ug/h via INTRAVENOUS
  Administered 2016-02-26 – 2016-02-27 (×7): 350 ug/h via INTRAVENOUS
  Administered 2016-02-28: 250 ug/h via INTRAVENOUS
  Administered 2016-02-28 – 2016-02-29 (×2): 200 ug/h via INTRAVENOUS
  Administered 2016-02-29 (×2): 250 ug/h via INTRAVENOUS
  Administered 2016-03-01: 200 ug/h via INTRAVENOUS
  Filled 2016-02-22 (×26): qty 50

## 2016-02-22 MED ORDER — PRO-STAT SUGAR FREE PO LIQD
60.0000 mL | Freq: Three times a day (TID) | ORAL | Status: DC
  Administered 2016-02-23 – 2016-02-28 (×18): 60 mL
  Filled 2016-02-22 (×18): qty 60

## 2016-02-22 MED ORDER — VECURONIUM BOLUS VIA INFUSION
5.0000 mg | Freq: Once | INTRAVENOUS | Status: AC
  Filled 2016-02-22: qty 5

## 2016-02-22 MED ORDER — FUROSEMIDE 10 MG/ML IJ SOLN
40.0000 mg | Freq: Two times a day (BID) | INTRAMUSCULAR | Status: AC
  Administered 2016-02-22 – 2016-02-23 (×4): 40 mg via INTRAVENOUS
  Filled 2016-02-22 (×4): qty 4

## 2016-02-22 MED ORDER — MIDAZOLAM HCL 2 MG/2ML IJ SOLN
INTRAMUSCULAR | Status: AC
  Filled 2016-02-22: qty 4

## 2016-02-22 MED ORDER — ARTIFICIAL TEARS OP OINT
1.0000 "application " | TOPICAL_OINTMENT | Freq: Three times a day (TID) | OPHTHALMIC | Status: DC
  Administered 2016-02-22 – 2016-02-23 (×5): 1 via OPHTHALMIC
  Filled 2016-02-22 (×2): qty 3.5

## 2016-02-22 MED ORDER — FENTANYL CITRATE (PF) 100 MCG/2ML IJ SOLN
100.0000 ug | Freq: Once | INTRAMUSCULAR | Status: AC
  Administered 2016-02-22: 100 ug via INTRAVENOUS

## 2016-02-22 MED ORDER — PROPOFOL 1000 MG/100ML IV EMUL
25.0000 ug/kg/min | INTRAVENOUS | Status: DC
  Administered 2016-02-22: 80 ug/kg/min via INTRAVENOUS
  Administered 2016-02-22: 40 ug/kg/min via INTRAVENOUS
  Administered 2016-02-22 – 2016-02-23 (×3): 80 ug/kg/min via INTRAVENOUS
  Administered 2016-02-23 (×2): 70 ug/kg/min via INTRAVENOUS
  Administered 2016-02-23: 80 ug/kg/min via INTRAVENOUS
  Administered 2016-02-23: 60 ug/kg/min via INTRAVENOUS
  Administered 2016-02-23 (×2): 70 ug/kg/min via INTRAVENOUS
  Administered 2016-02-23 – 2016-02-24 (×11): 80 ug/kg/min via INTRAVENOUS
  Administered 2016-02-25: 50 ug/kg/min via INTRAVENOUS
  Administered 2016-02-25 (×2): 80 ug/kg/min via INTRAVENOUS
  Administered 2016-02-25 (×3): 50 ug/kg/min via INTRAVENOUS
  Administered 2016-02-25 (×5): 80 ug/kg/min via INTRAVENOUS
  Administered 2016-02-26: 55 ug/kg/min via INTRAVENOUS
  Administered 2016-02-26: 30 ug/kg/min via INTRAVENOUS
  Administered 2016-02-26: 50 ug/kg/min via INTRAVENOUS
  Administered 2016-02-26: 30 ug/kg/min via INTRAVENOUS
  Administered 2016-02-26: 70 ug/kg/min via INTRAVENOUS
  Administered 2016-02-26: 50 ug/kg/min via INTRAVENOUS
  Administered 2016-02-26: 30 ug/kg/min via INTRAVENOUS
  Administered 2016-02-26 – 2016-02-27 (×2): 40 ug/kg/min via INTRAVENOUS
  Administered 2016-02-27: 30 ug/kg/min via INTRAVENOUS
  Administered 2016-02-27 (×2): 80 ug/kg/min via INTRAVENOUS
  Administered 2016-02-27: 40 ug/kg/min via INTRAVENOUS
  Administered 2016-02-27: 80 ug/kg/min via INTRAVENOUS
  Administered 2016-02-28 (×2): 50 ug/kg/min via INTRAVENOUS
  Administered 2016-02-28: 25 ug/kg/min via INTRAVENOUS
  Filled 2016-02-22 (×23): qty 100
  Filled 2016-02-22: qty 200
  Filled 2016-02-22 (×21): qty 100
  Filled 2016-02-22: qty 200
  Filled 2016-02-22 (×2): qty 100

## 2016-02-22 MED ORDER — FENTANYL CITRATE (PF) 100 MCG/2ML IJ SOLN: 100.0000 ug | Freq: Once | INTRAMUSCULAR | Status: DC | PRN

## 2016-02-22 MED ORDER — PIVOT 1.5 CAL PO LIQD
1000.0000 mL | ORAL | Status: DC
  Administered 2016-02-22 (×2): 1000 mL

## 2016-02-22 NOTE — Progress Notes (Signed)
eLink Physician-Brief Progress Note Patient Name: [name] DOB: 08-29-1976 MRN: [id number]   Date of Service  02/22/2016  HPI/Events of Note  Hypoxia - desaturation into the 80's. Now sat = 93% post bagging.   eICU Interventions  Will order: 1. Portable CXR now. 2. ABG now.  Defer further management to Trauma Service.     Intervention Category Major Interventions: Acid-Base disturbance - evaluation and management;Hypoxemia - evaluation and management  Lysle Dingwall 02/22/2016, 4:14 PM

## 2016-02-22 NOTE — Progress Notes (Signed)
Patient transported on ventilator to CT and back without complications.  Patient suctioned prior to and upon return.

## 2016-02-22 NOTE — Progress Notes (Signed)
Follow up - Trauma and Critical Care  Patient Details:    [name] is an 40 y.o. male.  Lines/tubes : PICC Triple Lumen [id number] PICC Right Basilic 43 cm 0 cm (Active)  Indication for Insertion or Continuance of Line Limited venous access - need for IV therapy >5 days (PICC only) 02/22/2016  8:00 AM  Exposed Catheter (cm) 0 cm 02/19/2016 12:00 PM  Site Assessment Clean;Dry;Intact 02/22/2016  8:00 AM  Lumen #1 Status Infusing 02/22/2016  8:00 AM  Lumen #2 Status Infusing 02/22/2016  8:00 AM  Lumen #3 Status Infusing 02/22/2016  8:00 AM  Dressing Type Transparent;Occlusive 02/22/2016  8:00 AM  Dressing Status Clean;Dry;Intact;Antimicrobial disc in place 02/22/2016  8:00 AM  Line Care Connections checked and tightened;Zeroed and calibrated;Leveled 02/22/2016  8:00 AM  Line Adjustment (NICU/IV Team Only) No 02/17/2016 11:38 AM  Dressing Intervention New dressing 02/17/2016 11:38 AM  Dressing Change Due 02/22/16 02/21/2016  8:00 PM     Arterial Line 02/20/16 Left Radial (Active)  Site Assessment Clean;Dry;Intact 02/22/2016  8:00 AM  Line Status Pulsatile blood flow 02/22/2016  8:00 AM  Art Line Waveform Appropriate 02/22/2016  8:00 AM  Art Line Interventions Zeroed and calibrated;Leveled;Connections checked and tightened;Flushed per protocol;Line pulled back 02/22/2016  8:00 AM  Color/Movement/Sensation Capillary refill less than 3 sec 02/22/2016  8:00 AM  Dressing Type Transparent;Occlusive 02/22/2016  8:00 AM  Dressing Status Clean;Dry;Other (Comment) 02/22/2016  8:00 AM  Dressing Change Due 02/22/16 02/22/2016  8:00 AM     Epidural Catheter 02/21/16 (Active)  Site Assessment Clean;Dry;Intact 02/22/2016  8:00 AM  Line Status Infusing 02/22/2016  8:00 AM  Dressing Type Transparent;Occlusive 02/22/2016  8:00 AM  Dressing Status Clean;Dry;Intact 02/22/2016  8:00 AM     Chest Tube 1 Left Pleural (Active)  Suction -20 cm H2O 02/22/2016  7:30 AM  Chest Tube Air Leak None 02/22/2016  7:30 AM  Patency Intervention Tip/tilt  02/22/2016  7:30 AM  Drainage Description Serosanguineous 02/22/2016  7:30 AM  Dressing Status Clean;Dry;Intact 02/22/2016  7:30 AM  Site Assessment Other (Comment) 02/22/2016  7:30 AM  Surrounding Skin Unable to view 02/22/2016  7:30 AM  Output (mL) 50 mL 02/22/2016  7:00 AM     NG/OG Tube Nasogastric 18 Fr. Right nare (Active)  Placement Verification Auscultation 02/22/2016  7:30 AM  Site Assessment Clean;Dry;Intact 02/22/2016  7:30 AM  Status Infusing tube feed 02/22/2016  7:30 AM  Amount of suction 45 mmHg 02/16/2016  2:20 PM  Drainage Appearance Tan 02/19/2016  8:00 PM  Intake (mL) 120 mL 02/21/2016 10:00 AM  Output (mL) 0 mL 02/21/2016 12:00 PM     Urethral Catheter ken emt  Temperature probe 18 Fr. (Active)  Indication for Insertion or Continuance of Catheter Peri-operative use for selective surgical procedure 02/22/2016  7:30 AM  Site Assessment Clean;Intact;Dry 02/22/2016  7:30 AM  Catheter Maintenance Bag below level of bladder;Catheter secured;Drainage bag/tubing not touching floor;Insertion date on drainage bag;No dependent loops;Seal intact;Bag emptied prior to transport 02/22/2016  7:30 AM  Collection Container Standard drainage bag 02/22/2016  7:30 AM  Securement Method Leg strap 02/22/2016  7:30 AM  Urinary Catheter Interventions Unclamped 02/22/2016  7:30 AM  Output (mL) 100 mL 02/22/2016  7:00 AM    Microbiology/Sepsis markers: Results for orders placed or performed during the hospital encounter of 02/14/16  MRSA PCR Screening     Status: None   Collection Time: 02/15/16  3:02 AM  Result Value Ref Range Status   MRSA by PCR NEGATIVE  NEGATIVE Final    Comment:        The GeneXpert MRSA Assay (FDA approved for NASAL specimens only), is one component of a comprehensive MRSA colonization surveillance program. It is not intended to diagnose MRSA infection nor to guide or monitor treatment for MRSA infections.   Culture, blood (Routine X 2) w Reflex to ID Panel     Status: None (Preliminary result)    Collection Time: 02/17/16  3:37 PM  Result Value Ref Range Status   Specimen Description BLOOD BLOOD LEFT FOREARM  Final   Special Requests BOTTLES DRAWN AEROBIC AND ANAEROBIC 5CC  Final   Culture NO GROWTH 4 DAYS  Final   Report Status PENDING  Incomplete  Culture, blood (Routine X 2) w Reflex to ID Panel     Status: None (Preliminary result)   Collection Time: 02/17/16  3:39 PM  Result Value Ref Range Status   Specimen Description BLOOD BLOOD LEFT FOREARM  Final   Special Requests BOTTLES DRAWN AEROBIC AND ANAEROBIC 5CC  Final   Culture NO GROWTH 4 DAYS  Final   Report Status PENDING  Incomplete  Culture, respiratory (NON-Expectorated)     Status: None   Collection Time: 02/17/16  3:52 PM  Result Value Ref Range Status   Specimen Description TRACHEAL ASPIRATE  Final   Special Requests Normal  Final   Gram Stain   Final    FEW WBC RARE SQUAMOUS EPITHELIAL CELLS PRESENT FEW GRAM POSITIVE COCCI IN PAIRS Performed at [facility]    Culture   Final    ABUNDANT ACINETOBACTER LWOFFII Performed at [facility]    Report Status 02/20/2016 FINAL  Final   Organism ID, Bacteria ACINETOBACTER LWOFFII  Final      Susceptibility   Acinetobacter lwoffii - MIC*    AMPICILLIN >=32 RESISTANT Resistant     AMPICILLIN/SULBACTAM <=2 SENSITIVE Sensitive     CEFAZOLIN >=64 RESISTANT Resistant     CEFEPIME 4 SENSITIVE Sensitive     CEFTAZIDIME 16 INTERMEDIATE Intermediate     CEFTRIAXONE 8 SENSITIVE Sensitive     CIPROFLOXACIN 0.5 SENSITIVE Sensitive     GENTAMICIN <=1 SENSITIVE Sensitive     IMIPENEM <=0.25 SENSITIVE Sensitive     PIP/TAZO >=128 RESISTANT Resistant     TOBRAMYCIN 8 INTERMEDIATE Intermediate     TRIMETH/SULFA Value in next row Sensitive      <=20 SENSITIVE(NOTE)    * ABUNDANT ACINETOBACTER LWOFFII    Anti-infectives:  Anti-infectives    Start     Dose/Rate Route Frequency Ordered Stop   02/21/16 0500  vancomycin (VANCOCIN) IVPB 1000 mg/200 mL premix      1,000 mg 200 mL/hr over 60 Minutes Intravenous Every 12 hours 02/20/16 1944 02/21/16 1712   02/20/16 1804  vancomycin (VANCOCIN) 1,000 mg in sodium chloride 0.9 % 1,000 mL irrigation  Status:  Discontinued       As needed 02/20/16 1804 02/20/16 1909   02/20/16 1745  vancomycin (VANCOCIN) 1,000 mg in sodium chloride 0.9 % 1,000 mL irrigation  Status:  Discontinued      Irrigation To Surgery 02/20/16 1736 02/21/16 1925   02/20/16 1600  piperacillin-tazobactam (ZOSYN) IVPB 3.375 g  Status:  Discontinued     3.375 g 12.5 mL/hr over 240 Minutes Intravenous Every 8 hours 02/20/16 0749 02/20/16 0810   02/20/16 1530  vancomycin (VANCOCIN) 1,000 mg in sodium chloride 0.9 % 250 mL IVPB  Status:  Discontinued     1,000 mg 250 mL/hr over  60 Minutes Intravenous Every 12 hours 02/20/16 1516 02/20/16 1530   02/20/16 1300  cefUROXime (ZINACEF) 1.5 g in dextrose 5 % 50 mL IVPB  Status:  Discontinued     1.5 g 100 mL/hr over 30 Minutes Intravenous 60 min pre-op 02/19/16 1504 02/20/16 0806   02/20/16 0830  levofloxacin (LEVAQUIN) IVPB 750 mg  Status:  Discontinued     750 mg 100 mL/hr over 90 Minutes Intravenous Every 24 hours 02/20/16 0812 02/20/16 0822   02/20/16 0830  Ampicillin-Sulbactam (UNASYN) 3 g in sodium chloride 0.9 % 100 mL IVPB     3 g 100 mL/hr over 60 Minutes Intravenous Every 6 hours 02/20/16 0825     02/20/16 0800  piperacillin-tazobactam (ZOSYN) IVPB 3.375 g  Status:  Discontinued     3.375 g 100 mL/hr over 30 Minutes Intravenous  Once 02/20/16 0747 02/20/16 0810   02/16/16 1500  clindamycin (CLEOCIN) IVPB 600 mg  Status:  Discontinued     600 mg 100 mL/hr over 30 Minutes Intravenous Every 6 hours 02/16/16 1420 02/20/16 0747   02/15/16 0144  ceFAZolin (ANCEF) 2-4 GM/100ML-% IVPB    Comments:  Albright, Cassandra : cabinet override      02/15/16 0144 02/15/16 1359   02/15/16 0100  ceFAZolin (ANCEF) IVPB 1 g/50 mL premix  Status:  Discontinued     1 g 100 mL/hr over 30 Minutes  Intravenous Every 6 hours 02/15/16 0049 02/19/16 0754   02/15/16 0045  ceFAZolin (ANCEF) IVPB 2g/100 mL premix  Status:  Discontinued     2 g 200 mL/hr over 30 Minutes Intravenous Every 8 hours 02/15/16 0043 02/15/16 0236      Best Practice/Protocols:  VTE Prophylaxis: Mechanical GI Prophylaxis: Proton Pump Inhibitor Continous Sedation On propofol and has epidural catheter in place  Consults: Treatment Team:  Melissa Montane, MD Ivin Poot, MD    Events:  Subjective:    Overnight Issues: Did well overnight and ABG this AM is better.  Objective:  Vital signs for last 24 hours: Temp:  [100 F (37.8 C)-101.3 F (38.5 C)] 100.2 F (37.9 C) (05/04 0800) Pulse Rate:  [83-112] 84 (05/04 0800) Resp:  [0-31] 24 (05/04 0800) BP: (94-172)/(54-73) 112/56 mmHg (05/04 0800) SpO2:  [94 %-100 %] 97 % (05/04 0800) Arterial Line BP: (91-176)/(46-83) 91/74 mmHg (05/04 0800) FiO2 (%):  [40 %-70 %] 40 % (05/04 0800) Weight:  [124.7 kg (274 lb 14.6 oz)] 124.7 kg (274 lb 14.6 oz) (05/04 0500)  Hemodynamic parameters for last 24 hours:    Intake/Output from previous day: 05/03 0701 - 05/04 0700 In: 5173.9 [I.V.:4195.6; NG/GT:246.3; IV Piggyback:600] Out: 2080 [Urine:1900; Chest Tube:180]  Intake/Output this shift: Total I/O In: 296.4 [I.V.:179.7; Other:11.7; NG/GT:5; IV Piggyback:100] Out: -   Vent settings for last 24 hours: Vent Mode:  [-] PRVC FiO2 (%):  [40 %-70 %] 40 % Set Rate:  [24 bmp] 24 bmp Vt Set:  [600 mL] 600 mL PEEP:  [10 cmH20] 10 cmH20 Plateau Pressure:  [24 [id number] cmH20] 26 cmH20  Physical Exam:  General: no respiratory distress and will awaken and follow commands Neuro: nonfocal exam, RASS 0 and no focal findings Resp: nonfocal exam, RASS 0 and following commands CVS: regular rate and rhythm, S1, S2 normal, no murmur, click, rub or gallop GI: soft, nontender, BS WNL, no r/g and but only getting tube feedings at 5 cc/hr Extremities: edema 2+  Results for  orders placed or performed during the hospital encounter of 02/14/16 (from the  past 24 hour(s))  Glucose, capillary     Status: Abnormal   Collection Time: 02/21/16 12:00 PM  Result Value Ref Range   Glucose-Capillary 126 (H) 65 - 99 mg/dL   Comment 1 Notify RN   Glucose, capillary     Status: Abnormal   Collection Time: 02/21/16  3:50 PM  Result Value Ref Range   Glucose-Capillary 118 (H) 65 - 99 mg/dL   Comment 1 Notify RN   Glucose, capillary     Status: Abnormal   Collection Time: 02/22/16 12:34 AM  Result Value Ref Range   Glucose-Capillary 112 (H) 65 - 99 mg/dL   Comment 1 Notify RN    Comment 2 Document in Chart   Glucose, capillary     Status: None   Collection Time: 02/22/16  3:55 AM  Result Value Ref Range   Glucose-Capillary 98 65 - 99 mg/dL   Comment 1 Notify RN    Comment 2 Document in Chart   Blood gas, arterial     Status: Abnormal   Collection Time: 02/22/16  4:35 AM  Result Value Ref Range   FIO2 0.50    Delivery systems VENTILATOR    Mode PRESSURE REGULATED VOLUME CONTROL    VT 600 mL   LHR 24 resp/min   Peep/cpap 10.0 cm H20   pH, Arterial 7.475 (H) 7.350 - 7.450   pCO2 arterial 39.4 35.0 - 45.0 mmHg   pO2, Arterial 101 (H) 80.0 - 100.0 mmHg   Bicarbonate 28.7 (H) 20.0 - 24.0 mEq/L   TCO2 29.9 0 - 100 mmol/L   Acid-Base Excess 5.1 (H) 0.0 - 2.0 mmol/L   O2 Saturation 98.7 %   Patient temperature 98.6    Collection site ARTERIAL LINE    Drawn by [phone]    Sample type ARTERIAL LINE    Allens test (pass/fail) PASS PASS  Comprehensive metabolic panel     Status: Abnormal   Collection Time: 02/22/16  4:38 AM  Result Value Ref Range   Sodium 140 135 - 145 mmol/L   Potassium 3.8 3.5 - 5.1 mmol/L   Chloride 103 101 - 111 mmol/L   CO2 27 22 - 32 mmol/L   Glucose, Bld 102 (H) 65 - 99 mg/dL   BUN 14 6 - 20 mg/dL   Creatinine, Ser 0.64 0.61 - 1.24 mg/dL   Calcium 7.8 (L) 8.9 - 10.3 mg/dL   Total Protein 4.9 (L) 6.5 - 8.1 g/dL   Albumin 1.7 (L) 3.5 - 5.0  g/dL   AST 41 15 - 41 U/L   ALT 26 17 - 63 U/L   Alkaline Phosphatase 39 38 - 126 U/L   Total Bilirubin 0.5 0.3 - 1.2 mg/dL   GFR calc non Af Amer >60 >60 mL/min   GFR calc Af Amer >60 >60 mL/min   Anion gap 10 5 - 15  CBC     Status: Abnormal   Collection Time: 02/22/16  4:38 AM  Result Value Ref Range   WBC 7.0 4.0 - 10.5 K/uL   RBC 2.66 (L) 4.22 - 5.81 MIL/uL   Hemoglobin 8.1 (L) 13.0 - 17.0 g/dL   HCT 25.4 (L) 39.0 - 52.0 %   MCV 95.5 78.0 - 100.0 fL   MCH 30.5 26.0 - 34.0 pg   MCHC 31.9 30.0 - 36.0 g/dL   RDW 13.8 11.5 - 15.5 %   Platelets 225 150 - 400 K/uL     Assessment/Plan:   NEURO  Altered Mental Status:  agitation and sedation   Plan: will change sedation to precedex.  PULM  Atelectasis/collapse (focal and LLL)   Plan: Continue ventilatory support  CARDIO  Sinus Tachycardia and intermittent   Plan: No specific treatment  RENAL  Urine output is okay, but very positive on fluids.   Plan: Diuresis and decrease IVFs  GI  CPM   Plan: Tube feedings to be increased  ID  No specific injuries   Plan: Contiue Unasyn  HEME  Anemia acute blood loss anemia and anemia of critical illness)   Plan: No blood for now  ENDO No specific problems   Plan: CPM  Global Issues  Vent wean.  Alter sedation to Precedex.  Lasix for diurese    LOS: 7 days   Additional comments:I reviewed the patient's new clinical lab test results. cbc/bmet and I reviewed the patients new imaging test results. cxr  Critical Care Total Time*: 30 Minutes  Berenise Hunton 02/22/2016  *Care during the described time interval was provided by me and/or other providers on the critical care team.  I have reviewed this patient's available data, including medical history, events of note, physical examination and test results as part of my evaluation.
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
## 2016-02-22 NOTE — Progress Notes (Signed)
Pt remains on vent, but plan to start weaning on IV precedex drip.  S/p rib plating on 5/2 with chest tube present x 1.  Will continue to follow progress.    Reinaldo Raddle, RN, BSN  Trauma/Neuro ICU Case Manager [phone]
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
## 2016-02-22 NOTE — Progress Notes (Signed)
RT was called to the room due to low saturations. When RT arrived patient was being manually ventilated with a peep valve attached. RT lavaged patient and was able to break up a large plug. However patient continued to have low saturations. Dr Hulen Skains was called to come look at patient and bronch him. RT performed a recruitment maneuver on patient after bronch and was able to maintain sats. RT will continue to monitor.

## 2016-02-22 NOTE — Progress Notes (Signed)
Patient decompensated this evening with low saturations and cyanosis.  CXR shows some atelectasis in the RLL, but no significant consolidated area to explain his hypoxemia.  Bronchoscopy also performed without any evidence of mucous plugging.  Patient paralyzed and sedated and will keep that way until an answer is found.  On FIO2 100% with PEEP +10.  Sats are low 90's  Recruitment maneuvers being performed.   Will get CT angio of the chest to rule out PE.  Kathryne Eriksson. Dahlia Bailiff, MD, [place] [phone] Trauma Surgeon

## 2016-02-22 NOTE — Progress Notes (Signed)
2 Days Post-Op Procedure(s) (LRB): LEFT RIB PLATING (Left) Subjective: CXR remains clear On Unisyn for gram neg VAP- temp 101.3 Min chest tube output w/o air leak- now to water seal Lungs clear to exam Epidural anesthesia now in place Objective: Vital signs in last 24 hours: Temp:  [99.7 F (37.6 C)-101.3 F (38.5 C)] 100.2 F (37.9 C) (05/04 1130) Pulse Rate:  [79-109] 79 (05/04 1130) Cardiac Rhythm:  [-] Normal sinus rhythm (05/04 0800) Resp:  [0-34] 24 (05/04 1130) BP: (93-172)/(50-73) 98/51 mmHg (05/04 1130) SpO2:  [89 %-100 %] 95 % (05/04 1130) Arterial Line BP: (91-176)/(46-106) 109/68 mmHg (05/04 0900) FiO2 (%):  [40 %-70 %] 40 % (05/04 0836) Weight:  [274 lb 14.6 oz (124.7 kg)] 274 lb 14.6 oz (124.7 kg) (05/04 0500)  Hemodynamic parameters for last 24 hours:   nsr Intake/Output from previous day: 05/03 0701 - 05/04 0700 In: 5173.9 [I.V.:4195.6; NG/GT:246.3; IV Piggyback:600] Out: 2080 [Urine:1900; Chest Tube:180] Intake/Output this shift: Total I/O In: 813.1 [I.V.:633.4; Other:29.7; NG/GT:50; IV Piggyback:100] Out: [id number] [Urine:1750; Chest Tube:40]       Exam    General- alert and comfortable   Lungs- clear without rales, wheezes   Cor- regular rate and rhythm, no murmur , gallop   Abdomen- soft, non-tender   Extremities - warm, non-tender, minimal edema   Neuro- oriented, appropriate, no focal weakness   Lab Results:  Recent Labs  02/21/16 0445 02/22/16 0438  WBC 7.9 7.0  HGB 9.3* 8.1*  HCT 27.7* 25.4*  PLT 211 225   BMET:  Recent Labs  02/21/16 0445 02/22/16 0438  NA 139 140  K 4.3 3.8  CL 101 103  CO2 29 27  GLUCOSE 132* 102*  BUN 10 14  CREATININE 0.80 0.64  CALCIUM 7.7* 7.8*    PT/INR:  Recent Labs  02/19/16 1745  LABPROT 14.8  INR 1.14   ABG    Component Value Date/Time   PHART 7.475* 02/22/2016 0435   HCO3 28.7* 02/22/2016 0435   TCO2 29.9 02/22/2016 0435   ACIDBASEDEF 1.0 02/17/2016 0435   O2SAT 98.7 02/22/2016 0435    CBG (last 3)   Recent Labs  02/22/16 0034 02/22/16 0355 02/22/16 0755  GLUCAP 112* 98 121*    Assessment/Plan: S/P Procedure(s) (LRB): LEFT RIB PLATING (Left) DC chest tube tomorrow   LOS: 7 days    [name] 02/22/2016

## 2016-02-23 ENCOUNTER — Inpatient Hospital Stay (HOSPITAL_COMMUNITY): Payer: Medicaid Other

## 2016-02-23 DIAGNOSIS — J189 Pneumonia, unspecified organism: Secondary | ICD-10-CM | POA: Diagnosis not present

## 2016-02-23 DIAGNOSIS — S02609A Fracture of mandible, unspecified, initial encounter for closed fracture: Secondary | ICD-10-CM | POA: Diagnosis present

## 2016-02-23 DIAGNOSIS — N179 Acute kidney failure, unspecified: Secondary | ICD-10-CM | POA: Diagnosis not present

## 2016-02-23 DIAGNOSIS — D62 Acute posthemorrhagic anemia: Secondary | ICD-10-CM | POA: Diagnosis not present

## 2016-02-23 DIAGNOSIS — S2220XA Unspecified fracture of sternum, initial encounter for closed fracture: Secondary | ICD-10-CM | POA: Diagnosis present

## 2016-02-23 DIAGNOSIS — S22009A Unspecified fracture of unspecified thoracic vertebra, initial encounter for closed fracture: Secondary | ICD-10-CM | POA: Diagnosis present

## 2016-02-23 DIAGNOSIS — S2243XA Multiple fractures of ribs, bilateral, initial encounter for closed fracture: Secondary | ICD-10-CM | POA: Diagnosis present

## 2016-02-23 LAB — CBC WITH DIFFERENTIAL/PLATELET
BASOS PCT: 0 %
Basophils Absolute: 0 10*3/uL (ref 0.0–0.1)
EOS ABS: 0.4 10*3/uL (ref 0.0–0.7)
Eosinophils Relative: 6 %
HEMATOCRIT: 25.2 % — AB (ref 39.0–52.0)
HEMOGLOBIN: 8.1 g/dL — AB (ref 13.0–17.0)
LYMPHS ABS: 0.9 10*3/uL (ref 0.7–4.0)
Lymphocytes Relative: 12 %
MCH: 30.6 pg (ref 26.0–34.0)
MCHC: 32.1 g/dL (ref 30.0–36.0)
MCV: 95.1 fL (ref 78.0–100.0)
MONOS PCT: 11 %
Monocytes Absolute: 0.8 10*3/uL (ref 0.1–1.0)
NEUTROS ABS: 5.3 10*3/uL (ref 1.7–7.7)
NEUTROS PCT: 71 %
Platelets: 269 10*3/uL (ref 150–400)
RBC: 2.65 MIL/uL — AB (ref 4.22–5.81)
RDW: 13.2 % (ref 11.5–15.5)
WBC: 7.5 10*3/uL (ref 4.0–10.5)

## 2016-02-23 LAB — TYPE AND SCREEN
ABO/RH(D): A POS
Antibody Screen: NEGATIVE
Unit division: 0
Unit division: 0
Unit division: 0
Unit division: 0
Unit division: 0

## 2016-02-23 LAB — POCT I-STAT 3, ART BLOOD GAS (G3+)
ACID-BASE EXCESS: 6 mmol/L — AB (ref 0.0–2.0)
BICARBONATE: 30.3 meq/L — AB (ref 20.0–24.0)
BICARBONATE: 25.9 meq/L — AB (ref 20.0–24.0)
O2 Saturation: 96 %
O2 Saturation: 93 %
PCO2 ART: 42.5 mmHg (ref 35.0–45.0)
PCO2 ART: 45.8 mmHg — AB (ref 35.0–45.0)
PH ART: 7.463 — AB (ref 7.350–7.450)
PH ART: 7.362 (ref 7.350–7.450)
PO2 ART: 81 mmHg (ref 80.0–100.0)
PO2 ART: 69 mmHg — AB (ref 80.0–100.0)
Patient temperature: 37.6
TCO2: 32 mmol/L (ref 0–100)
TCO2: 27 mmol/L (ref 0–100)

## 2016-02-23 LAB — GLUCOSE, CAPILLARY
GLUCOSE-CAPILLARY: 103 mg/dL — AB (ref 65–99)
GLUCOSE-CAPILLARY: 106 mg/dL — AB (ref 65–99)
GLUCOSE-CAPILLARY: 116 mg/dL — AB (ref 65–99)
Glucose-Capillary: 109 mg/dL — ABNORMAL HIGH (ref 65–99)
Glucose-Capillary: 116 mg/dL — ABNORMAL HIGH (ref 65–99)
Glucose-Capillary: 106 mg/dL — ABNORMAL HIGH (ref 65–99)

## 2016-02-23 LAB — BASIC METABOLIC PANEL
Anion gap: 10 (ref 5–15)
BUN: 12 mg/dL (ref 6–20)
CHLORIDE: 104 mmol/L (ref 101–111)
CO2: 29 mmol/L (ref 22–32)
Calcium: 7.8 mg/dL — ABNORMAL LOW (ref 8.9–10.3)
Creatinine, Ser: 0.69 mg/dL (ref 0.61–1.24)
GFR calc non Af Amer: >60 mL/min (ref >60)
Glucose, Bld: 116 mg/dL — ABNORMAL HIGH (ref 65–99)
POTASSIUM: 3.1 mmol/L — AB (ref 3.5–5.1)
SODIUM: 143 mmol/L (ref 135–145)

## 2016-02-23 LAB — TRIGLYCERIDES: Triglycerides: 238 mg/dL — ABNORMAL HIGH (ref <150)

## 2016-02-23 MED ORDER — POTASSIUM CHLORIDE 20 MEQ/15ML (10%) PO SOLN
20.0000 meq | Freq: Two times a day (BID) | ORAL | Status: DC
  Administered 2016-02-23 – 2016-03-04 (×21): 20 meq
  Filled 2016-02-23 (×21): qty 15

## 2016-02-23 MED ORDER — ENOXAPARIN SODIUM 30 MG/0.3ML ~~LOC~~ SOLN
30.0000 mg | Freq: Two times a day (BID) | SUBCUTANEOUS | Status: DC
  Administered 2016-02-23 – 2016-02-25 (×5): 30 mg via SUBCUTANEOUS
  Filled 2016-02-23 (×5): qty 0.3

## 2016-02-23 MED ORDER — ROPIVACAINE HCL 2 MG/ML IJ SOLN
10.0000 mL/h | INTRAMUSCULAR | Status: AC
  Administered 2016-02-23: 12 mL/h via EPIDURAL
  Administered 2016-02-24 – 2016-02-26 (×3): 10 mL/h via EPIDURAL
  Filled 2016-02-23 (×7): qty 200

## 2016-02-23 MED ORDER — GUAIFENESIN 100 MG/5ML PO SOLN
400.0000 mg | ORAL | Status: DC
  Administered 2016-02-23 – 2016-02-25 (×11): 400 mg
  Administered 2016-02-25: 200 mg
  Administered 2016-02-25: 400 mg
  Administered 2016-02-25: 200 mg
  Administered 2016-02-25 – 2016-03-04 (×42): 400 mg
  Filled 2016-02-23 (×9): qty 20
  Filled 2016-02-23: qty 15
  Filled 2016-02-23 (×4): qty 20
  Filled 2016-02-23: qty 10
  Filled 2016-02-23: qty 20
  Filled 2016-02-23: qty 10
  Filled 2016-02-23 (×2): qty 20
  Filled 2016-02-23: qty 10
  Filled 2016-02-23: qty 5
  Filled 2016-02-23 (×2): qty 20
  Filled 2016-02-23: qty 5
  Filled 2016-02-23: qty 20
  Filled 2016-02-23: qty 10
  Filled 2016-02-23 (×12): qty 20
  Filled 2016-02-23: qty 5
  Filled 2016-02-23 (×2): qty 20
  Filled 2016-02-23: qty 10
  Filled 2016-02-23 (×6): qty 20
  Filled 2016-02-23 (×2): qty 5
  Filled 2016-02-23: qty 20
  Filled 2016-02-23: qty 5
  Filled 2016-02-23 (×3): qty 20
  Filled 2016-02-23: qty 5
  Filled 2016-02-23 (×2): qty 20
  Filled 2016-02-23: qty 5
  Filled 2016-02-23: qty 20

## 2016-02-23 MED ORDER — PIVOT 1.5 CAL PO LIQD
1000.0000 mL | ORAL | Status: DC
  Administered 2016-02-23 – 2016-02-28 (×6): 1000 mL

## 2016-02-23 NOTE — Progress Notes (Signed)
3 Days Post-Op Procedure(s) (LRB): LEFT RIB PLATING (Left) Subjective: Starting venr weaning Chest tube removed after L rib plating  For flail chest  Objective: Vital signs in last 24 hours: Temp:  [97.5 F (36.4 C)-100.4 F (38 C)] 99 F (37.2 C) (05/05 1700) Pulse Rate:  [73-115] 75 (05/05 1700) Cardiac Rhythm:  [-] Normal sinus rhythm (05/05 0800) Resp:  [0-29] 24 (05/05 1700) BP: (91-149)/(47-88) 92/52 mmHg (05/05 1700) SpO2:  [92 %-100 %] 100 % (05/05 1700) FiO2 (%):  [50 %-90 %] 50 % (05/05 1547) Weight:  [269 lb 6.4 oz (122.2 kg)] 269 lb 6.4 oz (122.2 kg) (05/05 0500)  Hemodynamic parameters for last 24 hours:  stable  Intake/Output from previous day: 05/04 0701 - 05/05 0700 In: 5528.4 [I.V.:4513.7; NG/GT:415; IV Piggyback:450] Out: 4805 [Urine:4580; Chest Tube:225] Intake/Output this shift: Total I/O In: 1854.7 [I.V.:1570.7; Other:134; IV Piggyback:150] Out: 2735 [Urine:2725; Chest Tube:10]  Sedated, comfortable L VATS incision dry Lab Results:  Recent Labs  02/22/16 0438 02/23/16 0400  WBC 7.0 7.5  HGB 8.1* 8.1*  HCT 25.4* 25.2*  PLT 225 269   BMET:  Recent Labs  02/22/16 0438 02/23/16 0400  NA 140 143  K 3.8 3.1*  CL 103 104  CO2 27 29  GLUCOSE 102* 116*  BUN 14 12  CREATININE 0.64 0.69  CALCIUM 7.8* 7.8*    PT/INR: No results for input(s): LABPROT, INR in the last 72 hours. ABG    Component Value Date/Time   PHART 7.362 02/23/2016 1331   HCO3 25.9* 02/23/2016 1331   TCO2 27 02/23/2016 1331   ACIDBASEDEF 1.0 02/17/2016 0435   O2SAT 93.0 02/23/2016 1331   CBG (last 3)   Recent Labs  02/23/16 0353 02/23/16 0800 02/23/16 1604  GLUCAP 109* 116* 106*    Assessment/Plan: S/P Procedure(s) (LRB): LEFT RIB PLATING (Left) DC chest tube   LOS: 8 days    [name] 02/23/2016

## 2016-02-23 NOTE — Progress Notes (Signed)
Epidural Follow up: Patient resting comfortably. Epidural site examined and ok. Chart reviewed. Case discussed with nurse and Dr. Hulen Skains.  VSS Plan. Continue current epidural management[place]  [id number]

## 2016-02-23 NOTE — Progress Notes (Signed)
Pt's BP noted to be trending down. Sedation decreased with little improvement. On call Anesthesia MD called. Orders received to reduce Epidural to 48mL/hr (20mg /hr). Orders carried out with improvement in BP. Will continue to monitor pt closely.
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
## 2016-02-24 ENCOUNTER — Inpatient Hospital Stay (HOSPITAL_COMMUNITY): Payer: Medicaid Other

## 2016-02-24 DIAGNOSIS — D62 Acute posthemorrhagic anemia: Secondary | ICD-10-CM

## 2016-02-24 DIAGNOSIS — S225XXA Flail chest, initial encounter for closed fracture: Principal | ICD-10-CM

## 2016-02-24 DIAGNOSIS — N179 Acute kidney failure, unspecified: Secondary | ICD-10-CM

## 2016-02-24 DIAGNOSIS — T85698A Other mechanical complication of other specified internal prosthetic devices, implants and grafts, initial encounter: Secondary | ICD-10-CM

## 2016-02-24 DIAGNOSIS — Z9689 Presence of other specified functional implants: Secondary | ICD-10-CM | POA: Insufficient documentation

## 2016-02-24 LAB — POCT I-STAT 3, ART BLOOD GAS (G3+)
Acid-Base Excess: 4 mmol/L — ABNORMAL HIGH (ref 0.0–2.0)
Bicarbonate: 28.8 mEq/L — ABNORMAL HIGH (ref 20.0–24.0)
O2 Saturation: 94 %
Patient temperature: 99.7
TCO2: 30 mmol/L (ref 0–100)
pCO2 arterial: 45.7 mmHg — ABNORMAL HIGH (ref 35.0–45.0)
pH, Arterial: 7.41 (ref 7.350–7.450)
pO2, Arterial: 74 mmHg — ABNORMAL LOW (ref 80.0–100.0)

## 2016-02-24 LAB — CBC
HEMATOCRIT: 35.7 % — AB (ref 39.0–52.0)
HEMOGLOBIN: 11.8 g/dL — AB (ref 13.0–17.0)
MCH: 32.2 pg (ref 26.0–34.0)
MCHC: 33.1 g/dL (ref 30.0–36.0)
MCV: 97.5 fL (ref 78.0–100.0)
Platelets: 250 10*3/uL (ref 150–400)
RBC: 3.66 MIL/uL — AB (ref 4.22–5.81)
RDW: 13.2 % (ref 11.5–15.5)
WBC: 5 10*3/uL (ref 4.0–10.5)

## 2016-02-24 LAB — GLUCOSE, CAPILLARY
GLUCOSE-CAPILLARY: 99 mg/dL (ref 65–99)
Glucose-Capillary: 109 mg/dL — ABNORMAL HIGH (ref 65–99)
Glucose-Capillary: 119 mg/dL — ABNORMAL HIGH (ref 65–99)
Glucose-Capillary: 137 mg/dL — ABNORMAL HIGH (ref 65–99)
Glucose-Capillary: 104 mg/dL — ABNORMAL HIGH (ref 65–99)

## 2016-02-24 LAB — BASIC METABOLIC PANEL
ANION GAP: 9 (ref 5–15)
BUN: 13 mg/dL (ref 6–20)
CHLORIDE: 105 mmol/L (ref 101–111)
CO2: 29 mmol/L (ref 22–32)
Calcium: 8 mg/dL — ABNORMAL LOW (ref 8.9–10.3)
Creatinine, Ser: 0.75 mg/dL (ref 0.61–1.24)
GFR calc non Af Amer: >60 mL/min (ref >60)
GLUCOSE: 120 mg/dL — AB (ref 65–99)
POTASSIUM: 3.5 mmol/L (ref 3.5–5.1)
Sodium: 143 mmol/L (ref 135–145)

## 2016-02-24 MED ORDER — HALOPERIDOL LACTATE 5 MG/ML IJ SOLN
10.0000 mg | Freq: Four times a day (QID) | INTRAMUSCULAR | Status: DC | PRN
  Administered 2016-02-24 – 2016-02-28 (×9): 10 mg via INTRAVENOUS
  Filled 2016-02-24 (×10): qty 2

## 2016-02-24 MED ORDER — POTASSIUM CHLORIDE 10 MEQ/50ML IV SOLN
10.0000 meq | INTRAVENOUS | Status: AC
Start: 2016-02-24 — End: 2016-02-24
  Administered 2016-02-24 (×3): 10 meq via INTRAVENOUS
  Filled 2016-02-24 (×3): qty 50

## 2016-02-24 MED ORDER — HALOPERIDOL LACTATE 5 MG/ML IJ SOLN
INTRAMUSCULAR | Status: AC
  Administered 2016-02-24: 10 mg via INTRAVENOUS
  Filled 2016-02-24: qty 2

## 2016-02-24 MED ORDER — FUROSEMIDE 10 MG/ML IJ SOLN
40.0000 mg | Freq: Four times a day (QID) | INTRAMUSCULAR | Status: AC
  Administered 2016-02-24 – 2016-02-25 (×4): 40 mg via INTRAVENOUS
  Filled 2016-02-24 (×4): qty 4

## 2016-02-24 MED ORDER — ACETYLCYSTEINE 20 % IN SOLN
4.0000 mL | Freq: Two times a day (BID) | RESPIRATORY_TRACT | Status: AC
Start: 2016-02-24 — End: 2016-02-24
  Administered 2016-02-24 (×2): 4 mL via RESPIRATORY_TRACT
  Filled 2016-02-24 (×2): qty 4

## 2016-02-24 NOTE — Progress Notes (Signed)
New bag of Fentanyl hung at 1044. Approximately 15 mL of medication noted to remain in bag; wasted same in sink, witnessed by Achille Rich, RN.   Witnessed. [name]

## 2016-02-24 NOTE — Progress Notes (Signed)
4 Days Post-Op Procedure(s) (LRB): LEFT RIB PLATING (Left) Subjective: Postop day for left left rib plating for fractures-flail chest   Requiring sedation for agitation-pain Fevers resolved Chest x-ray clear after chest tube removed Completing course of Unasyn for gram-negative pneumonia Objective: Vital signs in last 24 hours: Temp:  [88.9 F (31.6 C)-100.2 F (37.9 C)] 99.7 F (37.6 C) (05/06 0824) Pulse Rate:  [72-109] 91 (05/06 0824) Cardiac Rhythm:  [-] Normal sinus rhythm (05/06 0400) Resp:  [0-25] 25 (05/06 0824) BP: (90-149)/(47-88) 99/53 mmHg (05/06 0600) SpO2:  [86 %-100 %] 94 % (05/06 0824) FiO2 (%):  [50 %] 50 % (05/06 0810) Weight:  [259 lb 14.8 oz (117.9 kg)] 259 lb 14.8 oz (117.9 kg) (05/06 0500)  Hemodynamic parameters for last 24 hours:  stable  Intake/Output from previous day: 05/05 0701 - 05/06 0700 In: 7248.1 [I.V.:4419.1; NG/GT:1995; IV Piggyback:450] Out: [id number] [Urine:5185; Stool:400; Chest Tube:10] Intake/Output this shift: Total I/O In: -  Out: 475 [Urine:375; Stool:100]       Exam    General- responsive on ventilator   Lungs- coarse breath sounds without rales, wheezes   Cor- regular rate and rhythm, no murmur , gallop   Abdomen- soft, non-tender   Extremities - warm, non-tender, minimal edema   Neuro- oriented, appropriate, no focal weakness   Lab Results:  Recent Labs  02/23/16 0400 02/24/16 0430  WBC 7.5 5.0  HGB 8.1* 11.8*  HCT 25.2* 35.7*  PLT 269 250   BMET:  Recent Labs  02/23/16 0400 02/24/16 0430  NA 143 143  K 3.1* 3.5  CL 104 105  CO2 29 29  GLUCOSE 116* 120*  BUN 12 13  CREATININE 0.69 0.75  CALCIUM 7.8* 8.0*    PT/INR: No results for input(s): LABPROT, INR in the last 72 hours. ABG    Component Value Date/Time   PHART 7.410 02/24/2016 0408   HCO3 28.8* 02/24/2016 0408   TCO2 30 02/24/2016 0408   ACIDBASEDEF 1.0 02/17/2016 0435   O2SAT 94.0 02/24/2016 0408   CBG (last 3)   Recent Labs   02/23/16 2334 02/24/16 0405 02/24/16 0826  GLUCAP 116* 109* 119*    Assessment/Plan: S/P Procedure(s) (LRB): LEFT RIB PLATING (Left) Vent wean per CCM   LOS: 9 days    Tharon Aquas Trigt III 02/24/2016

## 2016-02-24 NOTE — Progress Notes (Signed)
Morning potassium level = 3.5. Creatinine = 0.75. 47mEq potassium chloride IV x3 ordered per standing order.  [name]
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
## 2016-02-24 NOTE — Progress Notes (Signed)
125 mL of Vecuronium wasted in sink. Witnessed by Currie Paris, RN.

## 2016-02-24 NOTE — Progress Notes (Signed)
eLink Physician-Brief Progress Note Patient Name: [name] DOB: September 19, 1976 MRN: [id number]   Date of Service  02/24/2016  HPI/Events of Note  Request to renew restraint orders.  eICU Interventions  Will renew restraint orders.      Intervention Category Minor Interventions: Agitation / anxiety - evaluation and management  Sommer,Steven Eugene 02/24/2016, 6:20 PM
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
## 2016-02-25 ENCOUNTER — Inpatient Hospital Stay (HOSPITAL_COMMUNITY): Payer: Medicaid Other

## 2016-02-25 DIAGNOSIS — J942 Hemothorax: Secondary | ICD-10-CM

## 2016-02-25 DIAGNOSIS — J939 Pneumothorax, unspecified: Secondary | ICD-10-CM

## 2016-02-25 LAB — POCT I-STAT 3, ART BLOOD GAS (G3+)
Bicarbonate: 25 mEq/L — ABNORMAL HIGH (ref 20.0–24.0)
O2 Saturation: 96 %
Patient temperature: 98.6
TCO2: 26 mmol/L (ref 0–100)
pCO2 arterial: 40 mmHg (ref 35.0–45.0)
pH, Arterial: 7.404 (ref 7.350–7.450)
pO2, Arterial: 81 mmHg (ref 80.0–100.0)

## 2016-02-25 LAB — COMPREHENSIVE METABOLIC PANEL
ALBUMIN: 1.9 g/dL — AB (ref 3.5–5.0)
ALK PHOS: 57 U/L (ref 38–126)
ALT: 37 U/L (ref 17–63)
ANION GAP: 10 (ref 5–15)
AST: 39 U/L (ref 15–41)
BUN: 17 mg/dL (ref 6–20)
CALCIUM: 8.2 mg/dL — AB (ref 8.9–10.3)
CHLORIDE: 105 mmol/L (ref 101–111)
CO2: 27 mmol/L (ref 22–32)
CREATININE: 0.8 mg/dL (ref 0.61–1.24)
Glucose, Bld: 138 mg/dL — ABNORMAL HIGH (ref 65–99)
Potassium: 3.6 mmol/L (ref 3.5–5.1)
SODIUM: 142 mmol/L (ref 135–145)
Total Bilirubin: 0.5 mg/dL (ref 0.3–1.2)
Total Protein: 5.6 g/dL — ABNORMAL LOW (ref 6.5–8.1)

## 2016-02-25 LAB — CBC
HEMATOCRIT: 47.6 % (ref 39.0–52.0)
HEMOGLOBIN: 15.3 g/dL (ref 13.0–17.0)
MCH: 31.8 pg (ref 26.0–34.0)
MCHC: 32.1 g/dL (ref 30.0–36.0)
MCV: 99 fL (ref 78.0–100.0)
Platelets: 204 10*3/uL (ref 150–400)
RBC: 4.81 MIL/uL (ref 4.22–5.81)
RDW: 13.5 % (ref 11.5–15.5)
WBC: 8 10*3/uL (ref 4.0–10.5)

## 2016-02-25 LAB — GLUCOSE, CAPILLARY
GLUCOSE-CAPILLARY: 118 mg/dL — AB (ref 65–99)
GLUCOSE-CAPILLARY: 102 mg/dL — AB (ref 65–99)
GLUCOSE-CAPILLARY: 95 mg/dL (ref 65–99)
Glucose-Capillary: 131 mg/dL — ABNORMAL HIGH (ref 65–99)

## 2016-02-25 LAB — MAGNESIUM: MAGNESIUM: 1.8 mg/dL (ref 1.7–2.4)

## 2016-02-25 LAB — PHOSPHORUS: Phosphorus: 4.9 mg/dL — ABNORMAL HIGH (ref 2.5–4.6)

## 2016-02-25 MED ORDER — LIDOCAINE HCL (PF) 1 % IJ SOLN
INTRAMUSCULAR | Status: AC
  Administered 2016-02-25: 10 mg
  Filled 2016-02-25: qty 5

## 2016-02-25 MED ORDER — ENOXAPARIN SODIUM 30 MG/0.3ML ~~LOC~~ SOLN
30.0000 mg | Freq: Two times a day (BID) | SUBCUTANEOUS | Status: DC
  Administered 2016-02-27 – 2016-03-05 (×14): 30 mg via SUBCUTANEOUS
  Filled 2016-02-25 (×17): qty 0.3

## 2016-02-25 MED ORDER — QUETIAPINE FUMARATE 100 MG PO TABS
100.0000 mg | ORAL_TABLET | Freq: Two times a day (BID) | ORAL | Status: DC
  Administered 2016-02-25 (×2): 100 mg
  Filled 2016-02-25 (×2): qty 1

## 2016-02-25 NOTE — Procedures (Signed)
After informed consent, proper time out the Left anterior chest was prepped and draped and 1 % lidocaine infiltrated between the ribs in the mid clavicular line A 20 F chest tube was inserted thru the small incision A rush of air exited the chest when the pleural space was opened T tube was secured with 2 silk sutures and dressing applied CXR is pending P Prescott Gum MD

## 2016-02-25 NOTE — Progress Notes (Deleted)
[place] ICU Electrolyte Replacement Protocol  Patient Name: [name] DOB: [birth year] MRN: [id number]  Date of Service  02/25/2016   HPI/Events of Note    Recent Labs Lab 02/21/16 0445 02/22/16 0438 02/23/16 0400 02/24/16 0430 02/25/16 0455  NA 139 140 143 143 142  K 4.3 3.8 3.1* 3.5 3.6  CL 101 103 104 105 105  CO2 29 27 29 29 27   GLUCOSE 132* 102* 116* 120* 138*  BUN 10 14 12 13 17   CREATININE 0.80 0.64 0.69 0.75 0.80  CALCIUM 7.7* 7.8* 7.8* 8.0* 8.2*  MG  --   --   --   --  1.8  PHOS  --   --   --   --  4.9*    Estimated Creatinine Clearance: 161 mL/min (by C-G formula based on Cr of 0.8).  Intake/Output      05/06 0701 - 05/07 0700   I.V. (mL/kg) 3824 (32.4)   Other 248   NG/GT 1250   IV Piggyback 350   Total Intake(mL/kg) 5672 (48.1)   Urine (mL/kg/hr) 4500 (1.6)   Stool 700 (0.2)   Total Output 5200   Net +472        - I/O DETAILED x24h    Total I/O In: 2599.9 [I.V.:1793.9; Other:116; NG/GT:590; IV Piggyback:100] Out: 2600 [Urine:2200; Stool:400] - I/O THIS SHIFT    ASSESSMENT   eICURN Interventions  K+ 3.6 Replaced using ICU replacement protocol   ASSESSMENT: [name], [name] 02/25/2016, 6:10 AM

## 2016-02-25 NOTE — Progress Notes (Signed)
Morning potassium level = 3.6. Creatinine = 0.8. 62mEq IV Potassium x3 ordered per standing order. [name]

## 2016-02-25 NOTE — Anesthesia Post-op Follow-up Note (Signed)
  Anesthesia Pain Follow-up Note  Patient: [name]  Day #: 4  Date of Follow-up: 02/25/2016 Time: 11:02 PM  Last Vitals:  Filed Vitals:   02/25/16 2100 02/25/16 2200  BP: 101/54 101/53  Pulse: 85 88  Temp: 37.7 C 37.7 C  Resp: 20 20    Level of Consciousness: sedated, responsive to voice  Pain: mild grimace with turning   Side Effects:None  Catheter Site Exam:clean, dry, no drainage  Plan: Continue current therapy  Jullia Mulligan,E. Ernesto Zukowski

## 2016-02-25 NOTE — Progress Notes (Signed)
Report recieved from radiologist that morning chest x-ray shows a moderate left pneumothorax. Todd Griffon, NP at bedside and made aware. Dr. Prescott Gum also made aware.  [name]

## 2016-02-25 NOTE — Progress Notes (Signed)
Epidural Follow up Note: Epidural without complaint as per Nurse A who agreed site looked good. Patient was resting comfortably. Rate satisfactory. No problems. Plan. Continue current epidural management. Finis Bud, MD
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
## 2016-02-25 NOTE — Progress Notes (Signed)
5 Days Post-Op Procedure(s) (LRB): LEFT RIB PLATING (Left) Ctive:ubje  Morning x-ray shows moderate left upper field pneumothorax. Chest tube had been removed 48 hours ago Epidural catheter placed 3-4 days ago New chest tube placed anteriorly with resolution of pneumothorax  Objective: Vital signs in last 24 hours: Temp:  [97.9 F (36.6 C)-101.7 F (38.7 C)] 98.6 F (37 C) (05/07 1200) Pulse Rate:  [25-110] 73 (05/07 1207) Cardiac Rhythm:  [-] Normal sinus rhythm (05/07 0800) Resp:  [24-25] 24 (05/07 1207) BP: (85-122)/(49-62) 97/55 mmHg (05/07 1207) SpO2:  [89 %-100 %] 97 % (05/07 1207) FiO2 (%):  [50 %] 50 % (05/07 1207) Weight:  [270 lb 4.5 oz (122.6 kg)] 270 lb 4.5 oz (122.6 kg) (05/07 0600)  Hemodynamic parameters for last 24 hours:  sinus rhythm afebrile  Intake/Output from previous day: 05/06 0701 - 05/07 0700 In: 5916.9 [I.V.:3993.9; NG/GT:1315; IV Piggyback:350] Out: 5250 [Urine:4550; Stool:700] Intake/Output this shift: Total I/O In: 1134.5 [I.V.:849.5; Other:50; NG/GT:235] Out: [id number] [Urine:1225; Stool:300; Chest Tube:300]  Sedated on ventilator Slightly coarse breath sounds No air leak from recently placed chest tube Extremities warm  Lab Results:  Recent Labs  02/24/16 0430 02/25/16 0455  WBC 5.0 8.0  HGB 11.8* 15.3  HCT 35.7* 47.6  PLT 250 204   BMET:  Recent Labs  02/24/16 0430 02/25/16 0455  NA 143 142  K 3.5 3.6  CL 105 105  CO2 29 27  GLUCOSE 120* 138*  BUN 13 17  CREATININE 0.75 0.80  CALCIUM 8.0* 8.2*    PT/INR: No results for input(s): LABPROT, INR in the last 72 hours. ABG    Component Value Date/Time   PHART 7.410 02/24/2016 0408   HCO3 28.8* 02/24/2016 0408   TCO2 30 02/24/2016 0408   ACIDBASEDEF 1.0 02/17/2016 0435   O2SAT 94.0 02/24/2016 0408   CBG (last 3)   Recent Labs  02/24/16 1951 02/25/16 0738 02/25/16 1223  GLUCAP 104* 131* 118*    Assessment/Plan: S/P Procedure(s) (LRB): LEFT RIB PLATING  (Left) Leave chest tube to suction Chest x-ray in a.m.   LOS: 10 days    [name] 02/25/2016
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
## 2016-02-26 ENCOUNTER — Encounter (HOSPITAL_COMMUNITY): Payer: Self-pay | Admitting: Anesthesiology

## 2016-02-26 ENCOUNTER — Inpatient Hospital Stay (HOSPITAL_COMMUNITY): Payer: Medicaid Other

## 2016-02-26 DIAGNOSIS — J690 Pneumonitis due to inhalation of food and vomit: Secondary | ICD-10-CM

## 2016-02-26 DIAGNOSIS — Z930 Tracheostomy status: Secondary | ICD-10-CM

## 2016-02-26 LAB — COMPREHENSIVE METABOLIC PANEL
ALBUMIN: 1.9 g/dL — AB (ref 3.5–5.0)
ALT: 83 U/L — ABNORMAL HIGH (ref 17–63)
ANION GAP: 9 (ref 5–15)
AST: 110 U/L — ABNORMAL HIGH (ref 15–41)
Alkaline Phosphatase: 65 U/L (ref 38–126)
BUN: 21 mg/dL — ABNORMAL HIGH (ref 6–20)
CHLORIDE: 106 mmol/L (ref 101–111)
CO2: 27 mmol/L (ref 22–32)
Calcium: 8.1 mg/dL — ABNORMAL LOW (ref 8.9–10.3)
Creatinine, Ser: 0.71 mg/dL (ref 0.61–1.24)
GFR calc Af Amer: >60 mL/min (ref >60)
GFR calc non Af Amer: >60 mL/min (ref >60)
GLUCOSE: 130 mg/dL — AB (ref 65–99)
POTASSIUM: 3.8 mmol/L (ref 3.5–5.1)
SODIUM: 142 mmol/L (ref 135–145)
TOTAL PROTEIN: 5.1 g/dL — AB (ref 6.5–8.1)
Total Bilirubin: 0.6 mg/dL (ref 0.3–1.2)

## 2016-02-26 LAB — CBC
HCT: 25.7 % — ABNORMAL LOW (ref 39.0–52.0)
Hemoglobin: 8.1 g/dL — ABNORMAL LOW (ref 13.0–17.0)
MCH: 30.7 pg (ref 26.0–34.0)
MCHC: 31.5 g/dL (ref 30.0–36.0)
MCV: 97.3 fL (ref 78.0–100.0)
PLATELETS: 339 10*3/uL (ref 150–400)
RBC: 2.64 MIL/uL — ABNORMAL LOW (ref 4.22–5.81)
RDW: 13.8 % (ref 11.5–15.5)
WBC: 9.1 10*3/uL (ref 4.0–10.5)

## 2016-02-26 LAB — GLUCOSE, CAPILLARY
GLUCOSE-CAPILLARY: 136 mg/dL — AB (ref 65–99)
GLUCOSE-CAPILLARY: 109 mg/dL — AB (ref 65–99)
Glucose-Capillary: 105 mg/dL — ABNORMAL HIGH (ref 65–99)
Glucose-Capillary: 118 mg/dL — ABNORMAL HIGH (ref 65–99)
Glucose-Capillary: 119 mg/dL — ABNORMAL HIGH (ref 65–99)
Glucose-Capillary: 132 mg/dL — ABNORMAL HIGH (ref 65–99)
Glucose-Capillary: 99 mg/dL (ref 65–99)

## 2016-02-26 LAB — TRIGLYCERIDES: TRIGLYCERIDES: 163 mg/dL — AB (ref <150)

## 2016-02-26 MED ORDER — FUROSEMIDE 10 MG/ML PO SOLN
40.0000 mg | Freq: Every day | ORAL | Status: AC
Start: 2016-02-26 — End: 2016-02-28
  Administered 2016-02-26 – 2016-02-28 (×3): 40 mg
  Filled 2016-02-26: qty 5
  Filled 2016-02-26 (×2): qty 4

## 2016-02-26 MED ORDER — QUETIAPINE FUMARATE 100 MG PO TABS
100.0000 mg | ORAL_TABLET | Freq: Three times a day (TID) | ORAL | Status: DC
  Administered 2016-02-26 – 2016-02-27 (×4): 100 mg
  Filled 2016-02-26 (×4): qty 1

## 2016-02-26 MED ORDER — CLONAZEPAM 0.5 MG PO TBDP
0.5000 mg | ORAL_TABLET | Freq: Two times a day (BID) | ORAL | Status: DC
  Administered 2016-02-26 – 2016-02-27 (×3): 0.5 mg
  Filled 2016-02-26 (×3): qty 1
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
## 2016-02-26 NOTE — Progress Notes (Signed)
02/26/2016  1230 Pt. With extreme agitation, trying to get OOB, attempting to pull at tubes/lines. PRN haldol administered along with fentanyl bolus. Pt. HR elevated 120-140. RR 25-35. Pt. Placed back on full vent support. 1250  RN noted pt. With extreme agitation despite prn Haldol administration and Fentanyl Bolus. Pt. HR elevated 120-150 Sinus Tach, RR 35. MD on call paged and made aware. Verbal order received ok to increase Propofol despite goals to wean propofol today. Orders enacted. Pt. Family at bedside to assist with calming patient. Will continue to closely monitor patient.  Ashly Goethe, Arville Lime

## 2016-02-26 NOTE — Anesthesia Post-op Follow-up Note (Signed)
  Anesthesia Pain Follow-up Note  Patient: [name]  Day #: 5  Date of Follow-up: 02/26/2016 Time: 11:13 AM  Last Vitals:  Filed Vitals:   02/26/16 1000 02/26/16 1030  BP: 129/65 118/65  Pulse: 108 105  Temp: 37.4 C 37.4 C  Resp: 21 18    Level of Consciousness: lethargic  Pain: moderate   Side Effects:None  Catheter Site Exam:clean, dry  Plan: Catheter removed/tip intact, D/C Infusion and D/C from anesthesia care  [place]  D/C epidural intact w/o difficulty. Pt tolerated well.

## 2016-02-26 NOTE — Progress Notes (Signed)
02/26/2016 0840 Verbal order Dr. Hulen Skains ok to attempt weaning today. Verbal order also obtained to hold Lovenox this am due to possibility of Anesthesia removing epidural catheter today. Orders enacted. Will continue to closely monitor patient.  Euclide Granito, Arville Lime

## 2016-02-26 NOTE — Progress Notes (Signed)
6 Days Post-Op  Subjective: He is more awake.  Objective: Vital signs in last 24 hours: Temp:  [98.2 F (36.8 C)-100.4 F (38 C)] 99.5 F (37.5 C) (05/08 0800) Pulse Rate:  [68-110] 91 (05/08 0824) Resp:  [19-28] 21 (05/08 0824) BP: (86-137)/(47-87) 122/69 mmHg (05/08 0824) SpO2:  [90 %-100 %] 98 % (05/08 0824) FiO2 (%):  [50 %] 50 % (05/08 0824) Weight:  [124.9 kg (275 lb 5.7 oz)] 124.9 kg (275 lb 5.7 oz) (05/08 0124) Last BM Date: 02/26/16  Intake/Output from previous day: 05/07 0701 - 05/08 0700 In: 5096.1 [I.V.:3573.6; YY:[id number]; IV Piggyback:50] Out: [id number] [Urine:2735; Stool:1000; Chest Tube:500] Intake/Output this shift: Total I/O In: 142.4 [I.V.:97.4; Other:10; NG/GT:35] Out: 85 [Urine:75; Chest Tube:10]  no excessive swelling and no point tenderness in the mandible fracture area. he is responding aprropriately. his occlusion looks excellent  trach open and no issues  Lab Results:   Recent Labs  02/25/16 0455 02/26/16 0415  WBC 8.0 9.1  HGB 15.3 8.1*  HCT 47.6 25.7*  PLT 204 339   BMET  Recent Labs  02/25/16 0455 02/26/16 0415  NA 142 142  K 3.6 3.8  CL 105 106  CO2 27 27  GLUCOSE 138* 130*  BUN 17 21*  CREATININE 0.80 0.71  CALCIUM 8.2* 8.1*   PT/INR No results for input(s): LABPROT, INR in the last 72 hours. ABG  Recent Labs  02/24/16 0408 02/25/16 1324  PHART 7.410 7.404  HCO3 28.8* 25.0*    Studies/Results: Dg Chest Port 1 View  02/26/2016  CLINICAL DATA:  Status post multiple left rib fractures with plating of 3 left lateral ribs. EXAM: PORTABLE CHEST 1 VIEW COMPARISON:  Portable chest x-ray of Feb 25, 2016 FINDINGS: There remains a small left apical pneumothorax. There is persistent alveolar opacity in the lower left lung. There may be a small amount of pleural fluid on the left. On the right there is mild volume loss but no alveolar infiltrate. There is a probable small right pleural effusion layering posteriorly. The cardiac  silhouette is mildly enlarged. The central pulmonary vascularity is more prominent today. The tracheostomy appliance tip lies 1 cm above the superior margin of the clavicles. The right-sided PICC line tip projects over the midportion of the SVC. The esophagogastric tube tip projects below the inferior margin of the image. The left upper chest tube tip projects over the posterior aspect of the third rib. Plate and screw fixation of the lateral aspects of the fifth, sixth, and seventh ribs is again demonstrated. Multiple un fixed left posterior rib fractures are observed. IMPRESSION: Persistent approximately 5-10% left apical pneumothorax. The left chest tube is in stable position. Small bilateral pleural effusions. Mildly increased interstitial markings and increased cardiac silhouette size suggests low-grade CHF/ volume overload. Electronically Signed   By: David  Martinique M.D.   On: 02/26/2016 07:50   Dg Chest Port 1 View  02/25/2016  CLINICAL DATA:  Pneumothorax EXAM: PORTABLE CHEST 1 VIEW COMPARISON:  Feb 25, 2016 study obtained earlier in the day FINDINGS: There is now a chest tube on the left. Left sided pneumothorax has nearly completely resolved with only a small apical component remaining. No tension component. There is atelectasis in the left upper lobe and left base regions. There is a small right pleural effusion with atelectasis in the right base. Tracheostomy catheter tip is 6.8 cm above the carina. Nasogastric tube tip and side port below the diaphragm. Heart size and pulmonary vascularity are normal. There are  multiple rib fractures on the left, displaced. Several ribs show screw and plate fixation. IMPRESSION: Left chest tube present with only small apical pneumothorax present currently. No tension component. Displaced rib fractures remain on the left. There is atelectasis in the left upper lobe as well as in both lung bases. Small right pleural effusion present. Stable cardiac silhouette.  Electronically Signed   By: Lowella Grip III M.D.   On: 02/25/2016 11:04   Dg Chest Port 1 View  02/25/2016  CLINICAL DATA:  Patient with history of pneumonia and shortness of breath. EXAM: PORTABLE CHEST 1 VIEW COMPARISON:  Chest radiograph 02/24/2016 FINDINGS: Tracheostomy tube terminates in the mid trachea. Enteric tube courses inferior to the diaphragm. Stable cardiac and mediastinal contours given differences in patient rotation. Right upper extremity PICC line tip projects over the superior cavoatrial junction. Heterogeneous opacities right lung base and probable small right pleural effusion. Interval development of a moderate sized left pneumothorax. Multiple left-sided rib fractures are re- demonstrated. IMPRESSION: Interval development of a moderate left-sided pneumothorax. Consider evaluation with PA and lateral chest radiograph. Small right pleural effusion with underlying opacities favored to represent atelectasis. Stable support apparatus. Critical Value/emergent results were called by telephone at the time of interpretation on 02/25/2016 at 7:52 am to Nurse Achille Rich, who verbally acknowledged these results. Electronically Signed   By: Lovey Newcomer M.D.   On: 02/25/2016 07:57    Anti-infectives: Anti-infectives    Start     Dose/Rate Route Frequency Ordered Stop   02/21/16 0500  vancomycin (VANCOCIN) IVPB 1000 mg/200 mL premix     1,000 mg 200 mL/hr over 60 Minutes Intravenous Every 12 hours 02/20/16 1944 02/21/16 1712   02/20/16 1804  vancomycin (VANCOCIN) 1,000 mg in sodium chloride 0.9 % 1,000 mL irrigation  Status:  Discontinued       As needed 02/20/16 1804 02/20/16 1909   02/20/16 1745  vancomycin (VANCOCIN) 1,000 mg in sodium chloride 0.9 % 1,000 mL irrigation  Status:  Discontinued      Irrigation To Surgery 02/20/16 1736 02/21/16 1925   02/20/16 1600  piperacillin-tazobactam (ZOSYN) IVPB 3.375 g  Status:  Discontinued     3.375 g 12.5 mL/hr over 240 Minutes Intravenous  Every 8 hours 02/20/16 0749 02/20/16 0810   02/20/16 1530  vancomycin (VANCOCIN) 1,000 mg in sodium chloride 0.9 % 250 mL IVPB  Status:  Discontinued     1,000 mg 250 mL/hr over 60 Minutes Intravenous Every 12 hours 02/20/16 1516 02/20/16 1530   02/20/16 1300  cefUROXime (ZINACEF) 1.5 g in dextrose 5 % 50 mL IVPB  Status:  Discontinued     1.5 g 100 mL/hr over 30 Minutes Intravenous 60 min pre-op 02/19/16 1504 02/20/16 0806   02/20/16 0830  levofloxacin (LEVAQUIN) IVPB 750 mg  Status:  Discontinued     750 mg 100 mL/hr over 90 Minutes Intravenous Every 24 hours 02/20/16 0812 02/20/16 0822   02/20/16 0830  Ampicillin-Sulbactam (UNASYN) 3 g in sodium chloride 0.9 % 100 mL IVPB     3 g 100 mL/hr over 60 Minutes Intravenous Every 6 hours 02/20/16 0825 03/01/16 2359   02/20/16 0800  piperacillin-tazobactam (ZOSYN) IVPB 3.375 g  Status:  Discontinued     3.375 g 100 mL/hr over 30 Minutes Intravenous  Once 02/20/16 0747 02/20/16 0810   02/16/16 1500  clindamycin (CLEOCIN) IVPB 600 mg  Status:  Discontinued     600 mg 100 mL/hr over 30 Minutes Intravenous Every 6 hours 02/16/16 1420  02/20/16 0747   02/15/16 0144  ceFAZolin (ANCEF) 2-4 GM/100ML-% IVPB    Comments:  Albright, Cassandra : cabinet override      02/15/16 0144 02/15/16 1359   02/15/16 0100  ceFAZolin (ANCEF) IVPB 1 g/50 mL premix  Status:  Discontinued     1 g 100 mL/hr over 30 Minutes Intravenous Every 6 hours 02/15/16 0049 02/19/16 0754   02/15/16 0045  ceFAZolin (ANCEF) IVPB 2g/100 mL premix  Status:  Discontinued     2 g 200 mL/hr over 30 Minutes Intravenous Every 8 hours 02/15/16 0043 02/15/16 0236      Assessment/Plan: s/p Procedure(s): LEFT RIB PLATING (Left) he seems to be healing well with mandible fracture and the occlusion looks great. no swelling.   LOS: 11 days    Melissa Montane 02/26/2016

## 2016-02-27 ENCOUNTER — Inpatient Hospital Stay (HOSPITAL_COMMUNITY): Payer: Medicaid Other

## 2016-02-27 ENCOUNTER — Encounter (HOSPITAL_COMMUNITY): Payer: Self-pay | Admitting: Cardiothoracic Surgery

## 2016-02-27 LAB — GLUCOSE, CAPILLARY
GLUCOSE-CAPILLARY: 109 mg/dL — AB (ref 65–99)
GLUCOSE-CAPILLARY: 111 mg/dL — AB (ref 65–99)
GLUCOSE-CAPILLARY: 147 mg/dL — AB (ref 65–99)
Glucose-Capillary: 113 mg/dL — ABNORMAL HIGH (ref 65–99)
Glucose-Capillary: 133 mg/dL — ABNORMAL HIGH (ref 65–99)
Glucose-Capillary: 136 mg/dL — ABNORMAL HIGH (ref 65–99)
Glucose-Capillary: 143 mg/dL — ABNORMAL HIGH (ref 65–99)

## 2016-02-27 LAB — BASIC METABOLIC PANEL
Anion gap: 10 (ref 5–15)
BUN: 16 mg/dL (ref 6–20)
CALCIUM: 8.4 mg/dL — AB (ref 8.9–10.3)
CO2: 28 mmol/L (ref 22–32)
CREATININE: 0.61 mg/dL (ref 0.61–1.24)
Chloride: 103 mmol/L (ref 101–111)
GFR calc Af Amer: >60 mL/min (ref >60)
Glucose, Bld: 132 mg/dL — ABNORMAL HIGH (ref 65–99)
Potassium: 3.9 mmol/L (ref 3.5–5.1)
Sodium: 141 mmol/L (ref 135–145)

## 2016-02-27 LAB — CBC WITH DIFFERENTIAL/PLATELET
Basophils Absolute: 0 10*3/uL (ref 0.0–0.1)
Basophils Relative: 0 %
EOS ABS: 0.5 10*3/uL (ref 0.0–0.7)
EOS PCT: 5 %
HCT: 26.9 % — ABNORMAL LOW (ref 39.0–52.0)
Hemoglobin: 8.5 g/dL — ABNORMAL LOW (ref 13.0–17.0)
LYMPHS ABS: 1 10*3/uL (ref 0.7–4.0)
LYMPHS PCT: 12 %
MCH: 30.5 pg (ref 26.0–34.0)
MCHC: 31.6 g/dL (ref 30.0–36.0)
MCV: 96.4 fL (ref 78.0–100.0)
MONO ABS: 0.8 10*3/uL (ref 0.1–1.0)
Monocytes Relative: 9 %
Neutro Abs: 6.5 10*3/uL (ref 1.7–7.7)
Neutrophils Relative %: 74 %
PLATELETS: 424 10*3/uL — AB (ref 150–400)
RBC: 2.79 MIL/uL — ABNORMAL LOW (ref 4.22–5.81)
RDW: 13.3 % (ref 11.5–15.5)
WBC: 8.8 10*3/uL (ref 4.0–10.5)

## 2016-02-27 MED ORDER — CLONAZEPAM 0.5 MG PO TBDP
1.0000 mg | ORAL_TABLET | Freq: Three times a day (TID) | ORAL | Status: DC
  Administered 2016-02-27 – 2016-02-28 (×5): 1 mg
  Filled 2016-02-27 (×6): qty 2

## 2016-02-27 MED ORDER — QUETIAPINE FUMARATE 100 MG PO TABS
200.0000 mg | ORAL_TABLET | Freq: Three times a day (TID) | ORAL | Status: DC
  Administered 2016-02-27 – 2016-03-04 (×17): 200 mg
  Filled 2016-02-27 (×2): qty 2
  Filled 2016-02-27 (×6): qty 1
  Filled 2016-02-27 (×2): qty 2
  Filled 2016-02-27 (×3): qty 1
  Filled 2016-02-27 (×2): qty 2
  Filled 2016-02-27 (×2): qty 1

## 2016-02-27 MED ORDER — DEXMEDETOMIDINE HCL IN NACL 400 MCG/100ML IV SOLN
0.4000 ug/kg/h | INTRAVENOUS | Status: DC
  Administered 2016-02-27: 2 ug/kg/h via INTRAVENOUS
  Administered 2016-02-27: 0.5 ug/kg/h via INTRAVENOUS
  Filled 2016-02-27 (×4): qty 100
  Filled 2016-02-27: qty 50

## 2016-02-27 NOTE — Progress Notes (Signed)
Follow up - Trauma and Critical Care  Patient Details:    [name] is an 40 y.o. male.  Lines/tubes : PICC Triple Lumen [id number] PICC Right Basilic 43 cm 0 cm (Active)  Indication for Insertion or Continuance of Line Vasoactive infusions;Administration of hyperosmolar/irritating solutions (i.e. TPN, Vancomycin, etc.) 02/26/2016  8:00 PM  Exposed Catheter (cm) 0 cm 02/19/2016 12:00 PM  Site Assessment Clean;Dry;Intact 02/26/2016  8:00 PM  Lumen #1 Status Infusing 02/26/2016  8:00 PM  Lumen #2 Status Infusing 02/26/2016  8:00 PM  Lumen #3 Status Infusing 02/26/2016  8:00 PM  Dressing Type Transparent;Occlusive 02/26/2016  8:00 PM  Dressing Status Clean;Dry;Intact;Antimicrobial disc in place 02/26/2016  8:00 PM  Line Care Connections checked and tightened 02/26/2016  8:00 PM  Line Adjustment (NICU/IV Team Only) No 02/17/2016 11:38 AM  Dressing Intervention Dressing changed;Antimicrobial disc changed 02/25/2016  2:00 PM  Dressing Change Due 03/03/16 02/26/2016  8:00 PM     Epidural Catheter 02/21/16 (Active)  Site Assessment Clean;Dry;Intact 02/26/2016  8:00 PM  Line Status Infusing 02/26/2016  8:00 PM  Dressing Type Transparent;Occlusive 02/26/2016  8:00 PM  Dressing Status Clean;Dry;Intact 02/26/2016  8:00 PM  Dressing Change Due 02/28/16 02/26/2016  8:00 PM     Chest Tube 1 Left Pleural 20 Fr. (Active)  Suction -20 cm H2O 02/26/2016  8:00 PM  Chest Tube Air Leak None 02/26/2016  8:00 PM  Patency Intervention Tip/tilt 02/25/2016  8:00 PM  Drainage Description Serosanguineous 02/26/2016  8:00 PM  Dressing Status Clean;Dry;Intact 02/26/2016  8:00 PM  Site Assessment Clean;Dry;Intact 02/25/2016 11:00 AM  Surrounding Skin Unable to view 02/26/2016  8:00 PM  Output (mL) 0 mL 02/27/2016  5:00 AM     NG/OG Tube Nasogastric 18 Fr. Right nare (Active)  Placement Verification Auscultation 02/26/2016  8:00 PM  Site Assessment Clean;Dry;Intact 02/26/2016  8:00 PM  Status Infusing tube feed 02/26/2016  8:00 PM  Amount of suction 45 mmHg  02/16/2016  2:20 PM  Drainage Appearance Tan 02/23/2016  8:00 PM  Intake (mL) 120 mL 02/26/2016  9:24 AM  Output (mL) 0 mL 02/26/2016  3:00 AM     Rectal Tube/Pouch (Active)  Output (mL) 0 mL 02/27/2016  5:00 AM  Intake (mL) 90 mL 02/23/2016  2:00 PM     Urethral Catheter ken emt  Temperature probe 18 Fr. (Active)  Indication for Insertion or Continuance of Catheter Unstable critical patients (first 24-48 hours);Aggressive IV diuresis 02/26/2016  8:00 PM  Site Assessment Clean;Dry;Intact 02/26/2016  8:00 PM  Catheter Maintenance Bag below level of bladder;Catheter secured;Drainage bag/tubing not touching floor;Insertion date on drainage bag;No dependent loops;Seal intact 02/26/2016  8:00 PM  Collection Container Standard drainage bag 02/26/2016  8:00 PM  Securement Method Leg strap 02/26/2016  8:00 PM  Urinary Catheter Interventions Unclamped 02/22/2016  7:30 AM  Output (mL) 225 mL 02/27/2016  6:00 AM    Microbiology/Sepsis markers: Results for orders placed or performed during the hospital encounter of 02/14/16  MRSA PCR Screening     Status: None   Collection Time: 02/15/16  3:02 AM  Result Value Ref Range Status   MRSA by PCR NEGATIVE NEGATIVE Final    Comment:        The GeneXpert MRSA Assay (FDA approved for NASAL specimens only), is one component of a comprehensive MRSA colonization surveillance program. It is not intended to diagnose MRSA infection nor to guide or monitor treatment for MRSA infections.   Culture, blood (Routine X 2) w Reflex to ID  Panel     Status: None   Collection Time: 02/17/16  3:37 PM  Result Value Ref Range Status   Specimen Description BLOOD BLOOD LEFT FOREARM  Final   Special Requests BOTTLES DRAWN AEROBIC AND ANAEROBIC 5CC  Final   Culture NO GROWTH 5 DAYS  Final   Report Status 02/22/2016 FINAL  Final  Culture, blood (Routine X 2) w Reflex to ID Panel     Status: None   Collection Time: 02/17/16  3:39 PM  Result Value Ref Range Status   Specimen Description  BLOOD BLOOD LEFT FOREARM  Final   Special Requests BOTTLES DRAWN AEROBIC AND ANAEROBIC 5CC  Final   Culture NO GROWTH 5 DAYS  Final   Report Status 02/22/2016 FINAL  Final  Culture, respiratory (NON-Expectorated)     Status: None   Collection Time: 02/17/16  3:52 PM  Result Value Ref Range Status   Specimen Description TRACHEAL ASPIRATE  Final   Special Requests Normal  Final   Gram Stain   Final    FEW WBC RARE SQUAMOUS EPITHELIAL CELLS PRESENT FEW GRAM POSITIVE COCCI IN PAIRS Performed at [facility]    Culture   Final    ABUNDANT ACINETOBACTER LWOFFII Performed at [facility]    Report Status 02/20/2016 FINAL  Final   Organism ID, Bacteria ACINETOBACTER LWOFFII  Final      Susceptibility   Acinetobacter lwoffii - MIC*    AMPICILLIN >=32 RESISTANT Resistant     AMPICILLIN/SULBACTAM <=2 SENSITIVE Sensitive     CEFAZOLIN >=64 RESISTANT Resistant     CEFEPIME 4 SENSITIVE Sensitive     CEFTAZIDIME 16 INTERMEDIATE Intermediate     CEFTRIAXONE 8 SENSITIVE Sensitive     CIPROFLOXACIN 0.5 SENSITIVE Sensitive     GENTAMICIN <=1 SENSITIVE Sensitive     IMIPENEM <=0.25 SENSITIVE Sensitive     PIP/TAZO >=128 RESISTANT Resistant     TOBRAMYCIN 8 INTERMEDIATE Intermediate     TRIMETH/SULFA Value in next row Sensitive      <=20 SENSITIVE(NOTE)    * ABUNDANT ACINETOBACTER LWOFFII    Anti-infectives:  Anti-infectives    Start     Dose/Rate Route Frequency Ordered Stop   02/21/16 0500  vancomycin (VANCOCIN) IVPB 1000 mg/200 mL premix     1,000 mg 200 mL/hr over 60 Minutes Intravenous Every 12 hours 02/20/16 1944 02/21/16 1712   02/20/16 1804  vancomycin (VANCOCIN) 1,000 mg in sodium chloride 0.9 % 1,000 mL irrigation  Status:  Discontinued       As needed 02/20/16 1804 02/20/16 1909   02/20/16 1745  vancomycin (VANCOCIN) 1,000 mg in sodium chloride 0.9 % 1,000 mL irrigation  Status:  Discontinued      Irrigation To Surgery 02/20/16 1736 02/21/16 1925   02/20/16 1600   piperacillin-tazobactam (ZOSYN) IVPB 3.375 g  Status:  Discontinued     3.375 g 12.5 mL/hr over 240 Minutes Intravenous Every 8 hours 02/20/16 0749 02/20/16 0810   02/20/16 1530  vancomycin (VANCOCIN) 1,000 mg in sodium chloride 0.9 % 250 mL IVPB  Status:  Discontinued     1,000 mg 250 mL/hr over 60 Minutes Intravenous Every 12 hours 02/20/16 1516 02/20/16 1530   02/20/16 1300  cefUROXime (ZINACEF) 1.5 g in dextrose 5 % 50 mL IVPB  Status:  Discontinued     1.5 g 100 mL/hr over 30 Minutes Intravenous 60 min pre-op 02/19/16 1504 02/20/16 0806   02/20/16 0830  levofloxacin (LEVAQUIN) IVPB 750 mg  Status:  Discontinued  750 mg 100 mL/hr over 90 Minutes Intravenous Every 24 hours 02/20/16 0812 02/20/16 0822   02/20/16 0830  Ampicillin-Sulbactam (UNASYN) 3 g in sodium chloride 0.9 % 100 mL IVPB     3 g 100 mL/hr over 60 Minutes Intravenous Every 6 hours 02/20/16 0825 03/05/16 0829   02/20/16 0800  piperacillin-tazobactam (ZOSYN) IVPB 3.375 g  Status:  Discontinued     3.375 g 100 mL/hr over 30 Minutes Intravenous  Once 02/20/16 0747 02/20/16 0810   02/16/16 1500  clindamycin (CLEOCIN) IVPB 600 mg  Status:  Discontinued     600 mg 100 mL/hr over 30 Minutes Intravenous Every 6 hours 02/16/16 1420 02/20/16 0747   02/15/16 0144  ceFAZolin (ANCEF) 2-4 GM/100ML-% IVPB    Comments:  Albright, Cassandra : cabinet override      02/15/16 0144 02/15/16 1359   02/15/16 0100  ceFAZolin (ANCEF) IVPB 1 g/50 mL premix  Status:  Discontinued     1 g 100 mL/hr over 30 Minutes Intravenous Every 6 hours 02/15/16 0049 02/19/16 0754   02/15/16 0045  ceFAZolin (ANCEF) IVPB 2g/100 mL premix  Status:  Discontinued     2 g 200 mL/hr over 30 Minutes Intravenous Every 8 hours 02/15/16 0043 02/15/16 0236      Best Practice/Protocols:  VTE Prophylaxis: Lovenox (prophylaxtic dose) and Mechanical GI Prophylaxis: Proton Pump Inhibitor Continous Sedation  Consults: Treatment Team:  Melissa Montane, MD Ivin Poot, MD    Events:  Subjective:    Overnight Issues: Patient did well overnight, but agitated a lot yesterday.  Had to get haldol  Objective:  Vital signs for last 24 hours: Temp:  [95.4 F (35.2 C)-99.9 F (37.7 C)] 98.4 F (36.9 C) (05/09 0500) Pulse Rate:  [83-140] 107 (05/09 0500) Resp:  [16-29] 20 (05/09 0500) BP: (97-149)/(52-87) 141/81 mmHg (05/09 0500) SpO2:  [91 %-100 %] 97 % (05/09 0500) FiO2 (%):  [40 %-50 %] 40 % (05/09 0327) Weight:  [123.5 kg (272 lb 4.3 oz)] 123.5 kg (272 lb 4.3 oz) (05/09 0500)  Hemodynamic parameters for last 24 hours:    Intake/Output from previous day: 05/08 0701 - 05/09 0700 In: 4532.6 [I.V.:3277.6; NG/GT:925; IV Piggyback:300] Out: [id number]; Stool:300; Chest Tube:450]  Intake/Output this shift: Total I/O In: 2060 [I.V.:1575; NG/GT:385; IV Piggyback:100] Out: 2755 [Urine:2275; Stool:200; Chest Tube:280]  Vent settings for last 24 hours: Vent Mode:  [-] PRVC FiO2 (%):  [40 %-50 %] 40 % Set Rate:  [20 bmp] 20 bmp Vt Set:  [600 mL] 600 mL PEEP:  [5 cmH20] 5 cmH20 Pressure Support:  [10 cmH20-15 cmH20] 15 cmH20 Plateau Pressure:  [20 cmH20-22 cmH20] 22 cmH20  Physical Exam:  General: alert and no respiratory distress Neuro: alert, oriented and nonfocal exam Resp: Coarse on the right side. CVS: regular rate and rhythm, S1, S2 normal, no murmur, click, rub or gallop and intermittent sinus tachycardia GI: soft, nontender, BS WNL, no r/g and tolerating tube feedings.  diarrhea Extremities: edema 1+ and Edema has improved.  Results for orders placed or performed during the hospital encounter of 02/14/16 (from the past 24 hour(s))  Glucose, capillary     Status: Abnormal   Collection Time: 02/26/16  7:53 AM  Result Value Ref Range   Glucose-Capillary 109 (H) 65 - 99 mg/dL   Comment 1 Notify RN   Glucose, capillary     Status: Abnormal   Collection Time: 02/26/16 12:03 PM  Result Value Ref Range   Glucose-Capillary 132  (H)  65 - 99 mg/dL   Comment 1 Notify RN   Glucose, capillary     Status: Abnormal   Collection Time: 02/26/16  5:13 PM  Result Value Ref Range   Glucose-Capillary 105 (H) 65 - 99 mg/dL  Glucose, capillary     Status: None   Collection Time: 02/26/16  8:23 PM  Result Value Ref Range   Glucose-Capillary 99 65 - 99 mg/dL   Comment 1 Notify RN    Comment 2 Document in Chart   Glucose, capillary     Status: Abnormal   Collection Time: 02/27/16 12:09 AM  Result Value Ref Range   Glucose-Capillary 109 (H) 65 - 99 mg/dL   Comment 1 Notify RN    Comment 2 Document in Chart   CBC with Differential/Platelet     Status: Abnormal   Collection Time: 02/27/16  3:31 AM  Result Value Ref Range   WBC 8.8 4.0 - 10.5 K/uL   RBC 2.79 (L) 4.22 - 5.81 MIL/uL   Hemoglobin 8.5 (L) 13.0 - 17.0 g/dL   HCT 26.9 (L) 39.0 - 52.0 %   MCV 96.4 78.0 - 100.0 fL   MCH 30.5 26.0 - 34.0 pg   MCHC 31.6 30.0 - 36.0 g/dL   RDW 13.3 11.5 - 15.5 %   Platelets 424 (H) 150 - 400 K/uL   Neutrophils Relative % 74 %   Neutro Abs 6.5 1.7 - 7.7 K/uL   Lymphocytes Relative 12 %   Lymphs Abs 1.0 0.7 - 4.0 K/uL   Monocytes Relative 9 %   Monocytes Absolute 0.8 0.1 - 1.0 K/uL   Eosinophils Relative 5 %   Eosinophils Absolute 0.5 0.0 - 0.7 K/uL   Basophils Relative 0 %   Basophils Absolute 0.0 0.0 - 0.1 K/uL  Glucose, capillary     Status: Abnormal   Collection Time: 02/27/16  3:57 AM  Result Value Ref Range   Glucose-Capillary 111 (H) 65 - 99 mg/dL   Comment 1 Notify RN    Comment 2 Document in Chart      Assessment/Plan:   NEURO  Altered Mental Status:  agitation and mostly calm.  Cooperative   Plan: Change sedation to Precedex and try to wean  PULM  Atelectasis/collapse (focal and LL) Chest Wall Trauma flail chest and Pneumothorax (traumatic and rercurrent after chest tube removal)   Plan: CXR pending.  CVTS managing CT  CARDIO  Sinus Tachycardia   Plan: No specific treatment  RENAL  Urine output and  renal function are great.  May need more Lasix   Plan: Lasix  GI  No specific problems   Plan: Continue tube feedings.  ID  Pneumonia (hospital acquired (not ventilator-associated) Acinetobacter )   Plan: Acinetobacter PNA being treated appropriately  HEME  Anemia acute blood loss anemia and anemia of critical illness)   Plan: No blood for now.  ENDO No specific issues   Plan: CPM  Global Issues  Adjust sedation for weaning appropriately.  Would like to get the patient to trach collar if possible.  Will recheck Bmet    LOS: 12 days   Additional comments:  Labs and CXR checked.    Critical Care Total Time*: 30 Minutes  Donicia Druck 02/27/2016  *Care during the described time interval was provided by me and/or other providers on the critical care team.  I have reviewed this patient's available data, including medical history, events of note, physical examination and test results as part of my evaluation.

## 2016-02-27 NOTE — Progress Notes (Signed)
Notified Trauma MD patient has only put out 10cc of urine in the last hour. Flushed with 10cc and got that back out. Bladder scanned patient and volume in bladder was >999. Per MD take out old foley and place another one. Will continue to monitor.

## 2016-02-27 NOTE — Progress Notes (Signed)
Trach sutures removed per MD order without any complications.

## 2016-02-27 NOTE — Progress Notes (Signed)
7 Days Post-Op Procedure(s) (LRB): LEFT RIB PLATING (Left) Subjective: Patient off vent OOB to chair CXR stable with chest tube to water seal  Objective: Vital signs in last 24 hours: Temp:  [95.4 F (35.2 C)-99.9 F (37.7 C)] 99.1 F (37.3 C) (05/09 1500) Pulse Rate:  [75-126] 103 (05/09 1700) Cardiac Rhythm:  [-] Normal sinus rhythm (05/09 1600) Resp:  [16-25] 24 (05/09 1700) BP: (112-154)/(52-116) 142/87 mmHg (05/09 1700) SpO2:  [93 %-100 %] 97 % (05/09 1700) FiO2 (%):  [40 %-50 %] 40 % (05/09 1615) Weight:  [272 lb 4.3 oz (123.5 kg)] 272 lb 4.3 oz (123.5 kg) (05/09 0500)  Hemodynamic parameters for last 24 hours:  stable  Intake/Output from previous day: 05/08 0701 - 05/09 0700 In: 4532.6 [I.V.:3277.6; NG/GT:925; IV Piggyback:300] Out: [id number]; Stool:300; Chest Tube:450] Intake/Output this shift: Total I/O In: 1837.7 [I.V.:1557.7; NG/GT:80; IV Piggyback:200] Out: 1195 [Urine:1075; Chest Tube:120]  No air leak from chest tube  Lab Results:  Recent Labs  02/26/16 0415 02/27/16 0331  WBC 9.1 8.8  HGB 8.1* 8.5*  HCT 25.7* 26.9*  PLT 339 424*   BMET:  Recent Labs  02/26/16 0415 02/27/16 0800  NA 142 141  K 3.8 3.9  CL 106 103  CO2 27 28  GLUCOSE 130* 132*  BUN 21* 16  CREATININE 0.71 0.61  CALCIUM 8.1* 8.4*    PT/INR: No results for input(s): LABPROT, INR in the last 72 hours. ABG    Component Value Date/Time   PHART 7.404 02/25/2016 1324   HCO3 25.0* 02/25/2016 1324   TCO2 26 02/25/2016 1324   ACIDBASEDEF 1.0 02/17/2016 0435   O2SAT 96.0 02/25/2016 1324   CBG (last 3)   Recent Labs  02/27/16 0742 02/27/16 1203 02/27/16 1546  GLUCAP 113* 133* 136*    Assessment/Plan: S/P Procedure(s) (LRB): LEFT RIB PLATING (Left) Leave chest tube to water seal until epidural cath removed   LOS: 12 days    Tharon Aquas Trigt III 02/27/2016

## 2016-02-27 NOTE — Progress Notes (Addendum)
Notified Dr. Hulen Skains pt was trying to climb out of bed and pulled out his NG tube while in restraints. He is currently on the highest dose of Precedex and cannot get Haldol for another 3 hours. Per Dr. Hulen Skains will increase precedex dose to 2. Pt is now resting more comfortably and in a posey belt. Will continue to monitor closely.

## 2016-02-27 NOTE — Progress Notes (Signed)
Notified Trauma PA pt is very agitated and restless. He is bearing down frequently, desynchronous with the vent, and trying to climb out of the bed. Pt is maxed out on all medications and cannot receive anymore prn medications for several hours. Per MD will wean off Precedex and restart Propofol. Will continue to monitor closely.
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
## 2016-02-28 ENCOUNTER — Inpatient Hospital Stay (HOSPITAL_COMMUNITY): Payer: Medicaid Other

## 2016-02-28 LAB — BASIC METABOLIC PANEL
Anion gap: 11 (ref 5–15)
BUN: 22 mg/dL — AB (ref 6–20)
CALCIUM: 7.9 mg/dL — AB (ref 8.9–10.3)
CO2: 26 mmol/L (ref 22–32)
Chloride: 104 mmol/L (ref 101–111)
Creatinine, Ser: 0.64 mg/dL (ref 0.61–1.24)
GFR calc Af Amer: >60 mL/min (ref >60)
Glucose, Bld: 128 mg/dL — ABNORMAL HIGH (ref 65–99)
POTASSIUM: 3.8 mmol/L (ref 3.5–5.1)
SODIUM: 141 mmol/L (ref 135–145)

## 2016-02-28 LAB — CBC WITH DIFFERENTIAL/PLATELET
BASOS ABS: 0 10*3/uL (ref 0.0–0.1)
Basophils Relative: 0 %
EOS ABS: 0.4 10*3/uL (ref 0.0–0.7)
EOS PCT: 4 %
HCT: 24.1 % — ABNORMAL LOW (ref 39.0–52.0)
Hemoglobin: 8.1 g/dL — ABNORMAL LOW (ref 13.0–17.0)
Lymphocytes Relative: 14 %
Lymphs Abs: 1.2 10*3/uL (ref 0.7–4.0)
MCH: 32.5 pg (ref 26.0–34.0)
MCHC: 33.6 g/dL (ref 30.0–36.0)
MCV: 96.8 fL (ref 78.0–100.0)
Monocytes Absolute: 0.7 10*3/uL (ref 0.1–1.0)
Monocytes Relative: 8 %
Neutro Abs: 6.6 10*3/uL (ref 1.7–7.7)
Neutrophils Relative %: 74 %
PLATELETS: 418 10*3/uL — AB (ref 150–400)
RBC: 2.49 MIL/uL — AB (ref 4.22–5.81)
RDW: 13.2 % (ref 11.5–15.5)
WBC: 8.9 10*3/uL (ref 4.0–10.5)

## 2016-02-28 LAB — GLUCOSE, CAPILLARY
GLUCOSE-CAPILLARY: 111 mg/dL — AB (ref 65–99)
GLUCOSE-CAPILLARY: 92 mg/dL (ref 65–99)
Glucose-Capillary: 121 mg/dL — ABNORMAL HIGH (ref 65–99)
Glucose-Capillary: 115 mg/dL — ABNORMAL HIGH (ref 65–99)
Glucose-Capillary: 119 mg/dL — ABNORMAL HIGH (ref 65–99)

## 2016-02-28 NOTE — Progress Notes (Signed)
Continue weaning to trach collar.  Pt remains off propofol drip; fentanyl drip only currently.  Chest tube x 1 to water seal.  Recommend PT/OT consults if medically able to tolerate therapy.    Reinaldo Raddle, RN, BSN  Trauma/Neuro ICU Case Manager [phone]

## 2016-02-28 NOTE — Progress Notes (Addendum)
Report called to [facility]. Pt transported via bed with RN, RT, and NT. No complications and pt transferred to 42m tele with RN at bedside. Will continue to monitor closely.  Eleonore Chiquito RN 2 [place]
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
## 2016-02-28 NOTE — Plan of Care (Signed)
Problem: Phase II Progression Outcomes Goal: Date pt extubated/weaned off vent Outcome: Progressing Pt has weaned for several days. Agitation and delirium holding progress. Goal: Hemodynamically stable Outcome: Progressing Stable off drips Goal: Progress activities as ordered Outcome: Progressing Up OOB yesterday Goal: Tolerating prescribed nutrition plan Outcome: Progressing Tolerating TF

## 2016-02-29 ENCOUNTER — Inpatient Hospital Stay (HOSPITAL_COMMUNITY): Payer: Medicaid Other

## 2016-02-29 LAB — BASIC METABOLIC PANEL
ANION GAP: 10 (ref 5–15)
BUN: 20 mg/dL (ref 6–20)
CALCIUM: 8.6 mg/dL — AB (ref 8.9–10.3)
CO2: 29 mmol/L (ref 22–32)
CREATININE: 0.72 mg/dL (ref 0.61–1.24)
Chloride: 106 mmol/L (ref 101–111)
GFR calc Af Amer: >60 mL/min (ref >60)
Glucose, Bld: 105 mg/dL — ABNORMAL HIGH (ref 65–99)
Potassium: 3.8 mmol/L (ref 3.5–5.1)
Sodium: 145 mmol/L (ref 135–145)

## 2016-02-29 LAB — GLUCOSE, CAPILLARY
GLUCOSE-CAPILLARY: 100 mg/dL — AB (ref 65–99)
GLUCOSE-CAPILLARY: 103 mg/dL — AB (ref 65–99)
GLUCOSE-CAPILLARY: 112 mg/dL — AB (ref 65–99)
GLUCOSE-CAPILLARY: 113 mg/dL — AB (ref 65–99)
GLUCOSE-CAPILLARY: 124 mg/dL — AB (ref 65–99)
Glucose-Capillary: 115 mg/dL — ABNORMAL HIGH (ref 65–99)

## 2016-02-29 MED ORDER — LORAZEPAM 2 MG/ML IJ SOLN
1.0000 mg | Freq: Four times a day (QID) | INTRAMUSCULAR | Status: DC | PRN
  Administered 2016-02-29: 1 mg via INTRAVENOUS
  Administered 2016-02-29 – 2016-03-03 (×7): 2 mg via INTRAVENOUS
  Filled 2016-02-29 (×9): qty 1

## 2016-02-29 MED ORDER — CLONAZEPAM 0.5 MG PO TBDP
2.0000 mg | ORAL_TABLET | Freq: Three times a day (TID) | ORAL | Status: DC
  Administered 2016-02-29 – 2016-03-01 (×5): 2 mg
  Filled 2016-02-29 (×5): qty 4

## 2016-02-29 MED ORDER — PIVOT 1.5 CAL PO LIQD
1000.0000 mL | ORAL | Status: DC
  Administered 2016-02-29 – 2016-03-01 (×3): 1000 mL
  Filled 2016-02-29 (×4): qty 1000
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
## 2016-02-29 NOTE — Progress Notes (Addendum)
Inpatient Rehabilitation  Per PT request, patient was screened by Gunnar Fusi for appropriateness for an Inpatient Acute Rehab consult.  At this time we are recommending an Inpatient Rehab consult.  Trauma PA was contacted via text page for order request.  Please order if you are agreeable.    Carmelia Roller., CCC/SLP Admission Coordinator  [place]  Cell [phone]
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
## 2016-02-29 NOTE — Progress Notes (Signed)
Patient found to be sitting on edge of bed. Wrist restraints still secured to bed. NG Tube removed.

## 2016-03-01 ENCOUNTER — Inpatient Hospital Stay (HOSPITAL_COMMUNITY): Payer: Medicaid Other

## 2016-03-01 DIAGNOSIS — S225XXS Flail chest, sequela: Secondary | ICD-10-CM

## 2016-03-01 DIAGNOSIS — S069X0S Unspecified intracranial injury without loss of consciousness, sequela: Secondary | ICD-10-CM

## 2016-03-01 LAB — GLUCOSE, CAPILLARY
Glucose-Capillary: 111 mg/dL — ABNORMAL HIGH (ref 65–99)
Glucose-Capillary: 114 mg/dL — ABNORMAL HIGH (ref 65–99)
Glucose-Capillary: 99 mg/dL (ref 65–99)
Glucose-Capillary: 105 mg/dL — ABNORMAL HIGH (ref 65–99)
Glucose-Capillary: 100 mg/dL — ABNORMAL HIGH (ref 65–99)

## 2016-03-01 LAB — CBC
HCT: 28.5 % — ABNORMAL LOW (ref 39.0–52.0)
Hemoglobin: 9.3 g/dL — ABNORMAL LOW (ref 13.0–17.0)
MCH: 31.3 pg (ref 26.0–34.0)
MCHC: 32.6 g/dL (ref 30.0–36.0)
MCV: 96 fL (ref 78.0–100.0)
PLATELETS: 530 10*3/uL — AB (ref 150–400)
RBC: 2.97 MIL/uL — ABNORMAL LOW (ref 4.22–5.81)
RDW: 12.9 % (ref 11.5–15.5)
WBC: 8.2 10*3/uL (ref 4.0–10.5)

## 2016-03-01 MED ORDER — HYDROMORPHONE HCL 1 MG/ML IJ SOLN
1.0000 mg | INTRAMUSCULAR | Status: DC | PRN
  Administered 2016-03-01 (×2): 2 mg via INTRAVENOUS
  Administered 2016-03-01 – 2016-03-02 (×3): 1 mg via INTRAVENOUS
  Administered 2016-03-02 – 2016-03-03 (×12): 2 mg via INTRAVENOUS
  Administered 2016-03-03: 1 mg via INTRAVENOUS
  Administered 2016-03-03 – 2016-03-04 (×7): 2 mg via INTRAVENOUS
  Filled 2016-03-01: qty 1
  Filled 2016-03-01 (×12): qty 2
  Filled 2016-03-01: qty 1
  Filled 2016-03-01 (×2): qty 2
  Filled 2016-03-01: qty 1
  Filled 2016-03-01 (×7): qty 2

## 2016-03-01 MED ORDER — HYDROMORPHONE HCL 1 MG/ML IJ SOLN
1.0000 mg | INTRAMUSCULAR | Status: DC | PRN
Start: 2016-03-01 — End: 2016-03-01
  Administered 2016-03-01: 2 mg via INTRAVENOUS
  Filled 2016-03-01 (×2): qty 2
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
## 2016-03-01 NOTE — Progress Notes (Signed)
      [address]       [place] [address]             [phone]      10 Days Post-Op Procedure(s) (LRB): LEFT RIB PLATING (Left)   Subjective:  No new complaints.  Vision has improved.  Working with SLP to place speaking valve.  Objective: Vital signs in last 24 hours: Temp:  [98.6 F (37 C)-99.1 F (37.3 C)] 98.6 F (37 C) (05/12 0800) Pulse Rate:  [76-121] 95 (05/12 1100) Cardiac Rhythm:  [-] Normal sinus rhythm (05/12 0800) Resp:  [14-35] 16 (05/12 1100) BP: (102-176)/(60-99) 102/60 mmHg (05/12 1100) SpO2:  [92 %-99 %] 96 % (05/12 1100) FiO2 (%):  [40 %] 40 % (05/12 1100) Weight:  [253 lb 4.9 oz (114.9 kg)] 253 lb 4.9 oz (114.9 kg) (05/12 0349)  Intake/Output from previous day: 05/11 0701 - 05/12 0700 In: 2346.9 [I.V.:800.8; [id number]; IV Piggyback:400] Out: 2600 [Urine:2450; Chest Tube:150] Intake/Output this shift: Total I/O In: 325 [I.V.:85; NG/GT:240] Out: 250 [Urine:250]  General appearance: alert, cooperative and no distress Heart: regular rate and rhythm Lungs: clear to auscultation bilaterally Wound: clean and dry  Lab Results:  Recent Labs  02/28/16 0435 03/01/16 0540  WBC 8.9 8.2  HGB 8.1* 9.3*  HCT 24.1* 28.5*  PLT 418* 530*   BMET:  Recent Labs  02/28/16 0435 02/29/16 0510  NA 141 145  K 3.8 3.8  CL 104 106  CO2 26 29  GLUCOSE 128* 105*  BUN 22* 20  CREATININE 0.64 0.72  CALCIUM 7.9* 8.6*    PT/INR: No results for input(s): LABPROT, INR in the last 72 hours. ABG    Component Value Date/Time   PHART 7.404 02/25/2016 1324   HCO3 25.0* 02/25/2016 1324   TCO2 26 02/25/2016 1324   ACIDBASEDEF 1.0 02/17/2016 0435   O2SAT 96.0 02/25/2016 1324   CBG (last 3)   Recent Labs  02/29/16 1913 03/01/16 0344 03/01/16 0826  GLUCAP 115* 111* 114*    Assessment/Plan: S/P Procedure(s) (LRB): LEFT RIB PLATING (Left)  1. Chest tube- 150 cc output yesterday- no air leak present- can likely remove today 2.  Pulm- tolerating vent wean with Trach, speaking valve trials started 3. Dispo- patient stable, care per trauma   LOS: 15 days    Ellwood Handler 03/01/2016
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
## 2016-03-01 NOTE — Progress Notes (Signed)
10 Days Post-Op Procedure(s) (LRB): LEFT RIB PLATING (Left) Subjective: Status post left rib plating for multiple rib fractures Patient now on trach collar 24 hours out of bed to chair Chest x-ray personally reviewed showing no pneumothorax chest tube in position Chest tube is on waterseal-with strong cough there is still a small air bubble leak We'll leave chest tube in place to water seal until leak resolves  Objective: Vital signs in last 24 hours: Temp:  [98.6 F (37 C)-99.1 F (37.3 C)] 98.9 F (37.2 C) (05/12 1201) Pulse Rate:  [76-121] 101 (05/12 1201) Cardiac Rhythm:  [-] Normal sinus rhythm (05/12 0800) Resp:  [14-35] 19 (05/12 1201) BP: (102-176)/(60-99) 102/60 mmHg (05/12 1100) SpO2:  [92 %-99 %] 98 % (05/12 1201) FiO2 (%):  [28 %-40 %] 28 % (05/12 1201) Weight:  [253 lb 4.9 oz (114.9 kg)] 253 lb 4.9 oz (114.9 kg) (05/12 0349)  Hemodynamic parameters for last 24 hours:    Intake/Output from previous day: 05/11 0701 - 05/12 0700 In: 2346.9 [I.V.:800.8; [id number]; IV Piggyback:400] Out: 2600 [Urine:2450; Chest Tube:150] Intake/Output this shift: Total I/O In: 325 [I.V.:85; NG/GT:240] Out: 250 [Urine:250]  Breath sounds clear Neuro intact Left chest incision clean and dry, healing well Lab Results:  Recent Labs  02/28/16 0435 03/01/16 0540  WBC 8.9 8.2  HGB 8.1* 9.3*  HCT 24.1* 28.5*  PLT 418* 530*   BMET:  Recent Labs  02/28/16 0435 02/29/16 0510  NA 141 145  K 3.8 3.8  CL 104 106  CO2 26 29  GLUCOSE 128* 105*  BUN 22* 20  CREATININE 0.64 0.72  CALCIUM 7.9* 8.6*    PT/INR: No results for input(s): LABPROT, INR in the last 72 hours. ABG    Component Value Date/Time   PHART 7.404 02/25/2016 1324   HCO3 25.0* 02/25/2016 1324   TCO2 26 02/25/2016 1324   ACIDBASEDEF 1.0 02/17/2016 0435   O2SAT 96.0 02/25/2016 1324   CBG (last 3)   Recent Labs  02/29/16 1913 03/01/16 0344 03/01/16 0826  GLUCAP 115* 111* 114*     Assessment/Plan: S/P Procedure(s) (LRB): LEFT RIB PLATING (Left) Mobilize Continue left  chest tube to water seal    LOS: 15 days    Tharon Aquas Trigt III 03/01/2016
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
## 2016-03-02 ENCOUNTER — Inpatient Hospital Stay (HOSPITAL_COMMUNITY): Payer: Medicaid Other

## 2016-03-02 LAB — GLUCOSE, CAPILLARY
GLUCOSE-CAPILLARY: 110 mg/dL — AB (ref 65–99)
GLUCOSE-CAPILLARY: 123 mg/dL — AB (ref 65–99)
Glucose-Capillary: 100 mg/dL — ABNORMAL HIGH (ref 65–99)
Glucose-Capillary: 134 mg/dL — ABNORMAL HIGH (ref 65–99)

## 2016-03-02 MED ORDER — IPRATROPIUM-ALBUTEROL 0.5-2.5 (3) MG/3ML IN SOLN
3.0000 mL | RESPIRATORY_TRACT | Status: DC | PRN
  Administered 2016-03-02: 3 mL via RESPIRATORY_TRACT
  Filled 2016-03-02: qty 3

## 2016-03-02 MED ORDER — OXYCODONE HCL 5 MG PO TABS
5.0000 mg | ORAL_TABLET | ORAL | Status: DC | PRN
  Administered 2016-03-02: 10 mg via ORAL
  Administered 2016-03-02 – 2016-03-04 (×10): 15 mg via ORAL
  Filled 2016-03-02 (×10): qty 3
  Filled 2016-03-02: qty 2

## 2016-03-02 MED ORDER — METHOCARBAMOL 1000 MG/10ML IJ SOLN
1000.0000 mg | Freq: Three times a day (TID) | INTRAVENOUS | Status: DC | PRN
  Administered 2016-03-02 – 2016-03-03 (×2): 1000 mg via INTRAVENOUS
  Filled 2016-03-02 (×4): qty 10

## 2016-03-02 MED ORDER — HYDROCORTISONE 2.5 % RE CREA
TOPICAL_CREAM | Freq: Two times a day (BID) | RECTAL | Status: DC
  Administered 2016-03-02: 1 via TOPICAL
  Filled 2016-03-02: qty 28.35

## 2016-03-02 MED ORDER — WHITE PETROLATUM GEL
Status: AC
  Administered 2016-03-02: 0.2
  Filled 2016-03-02: qty 1

## 2016-03-02 MED ORDER — PNEUMOCOCCAL VAC POLYVALENT 25 MCG/0.5ML IJ INJ
0.5000 mL | INJECTION | INTRAMUSCULAR | Status: AC
  Administered 2016-03-03: 0.5 mL via INTRAMUSCULAR
  Filled 2016-03-02: qty 0.5
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
## 2016-03-02 NOTE — Progress Notes (Signed)
Patient ID: [name], male   DOB: 1976-04-17, 40 y.o.   MRN: [id number] [facility] DAILY ICU PROGRESS NOTE                   [address]            [place] [address]          [phone]   11 Days Post-Op Procedure(s) (LRB): LEFT RIB PLATING (Left)  Total Length of Stay:  LOS: 16 days   Subjective: Up to chair alert   Objective: Vital signs in last 24 hours: Temp:  [97.9 F (36.6 C)-98.9 F (37.2 C)] 98.3 F (36.8 C) (05/13 0749) Pulse Rate:  [92-120] 98 (05/13 0749) Cardiac Rhythm:  [-] Normal sinus rhythm (05/13 0800) Resp:  [13-31] 13 (05/13 0749) BP: (101-162)/(58-94) 141/87 mmHg (05/13 0749) SpO2:  [91 %-100 %] 93 % (05/13 0749) FiO2 (%):  [28 %-40 %] 35 % (05/13 0749) Weight:  [112.628 kg (248 lb 4.8 oz)] 112.628 kg (248 lb 4.8 oz) (05/13 0700)  Filed Weights   02/29/16 0400 03/01/16 0349 03/02/16 0700  Weight: 117.8 kg (259 lb 11.2 oz) 114.9 kg (253 lb 4.9 oz) 112.628 kg (248 lb 4.8 oz)    Weight change: -2.272 kg (-5 lb 0.1 oz)   Hemodynamic parameters for last 24 hours:    Intake/Output from previous day: 05/12 0701 - 05/13 0700 In: 1725 [I.V.:285; NG/GT:1440] Out: 1350 [Urine:1325; Chest Tube:25]  Intake/Output this shift: Total I/O In: 10 [I.V.:10] Out: 450 [Urine:450]  Current Meds: Scheduled Meds: . antiseptic oral rinse  7 mL Mouth Rinse 10 times per day  . budesonide (PULMICORT) nebulizer solution  0.5 mg Nebulization BID  . chlorhexidine gluconate (SAGE KIT)  15 mL Mouth Rinse BID  . docusate  100 mg Per Tube BID  . enoxaparin (LOVENOX) injection  30 mg Subcutaneous Q12H  . feeding supplement (PIVOT 1.5 CAL)  1,000 mL Per Tube Q24H  . guaiFENesin  400 mg Per Tube Q4H  . ipratropium-albuterol  3 mL Nebulization TID  . multivitamin  15 mL Per Tube Daily  . pantoprazole (PROTONIX) IV  40 mg Intravenous Q24H  . potassium chloride  20 mEq Per Tube BID  . QUEtiapine  200 mg Per Tube TID  . sennosides  5 mL Per Tube Daily  .  sodium chloride flush  10-40 mL Intracatheter Q12H   Continuous Infusions: . dextrose 5 % and 0.45 % NaCl with KCl 20 mEq/L 10 mL/hr at 03/02/16 0200   PRN Meds:.bisacodyl, HYDROmorphone (DILAUDID) injection, LORazepam, methocarbamol (ROBAXIN)  IV, ondansetron (ZOFRAN) IV, oxyCODONE, sodium chloride flush  General appearance: alert and cooperative Neurologic: intact Heart: regular rate and rhythm, S1, S2 normal, no murmur, click, rub or gallop Lungs: diminished breath sounds bibasilar Abdomen: soft, non-tender; bowel sounds normal; no masses,  no organomegaly Extremities: extremities normal, atraumatic, no cyanosis or edema and Homans sign is negative, no sign of DVT Wound: no ait leak from chest tube  Lab Results: CBC: Recent Labs  03/01/16 0540  WBC 8.2  HGB 9.3*  HCT 28.5*  PLT 530*   BMET:  Recent Labs  02/29/16 0510  NA 145  K 3.8  CL 106  CO2 29  GLUCOSE 105*  BUN 20  CREATININE 0.72  CALCIUM 8.6*    PT/INR: No results for input(s): LABPROT, INR in the last 72 hours. Radiology: No results found.   Assessment/Plan: S/P Procedure(s) (LRB): LEFT RIB PLATING (Left) Mobilize d/c tubes/lines No  air leak or ptx, minimail drainage past 24 hours, d/c chest tube    Grace Isaac 03/02/2016 10:05 AM
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
## 2016-03-03 ENCOUNTER — Inpatient Hospital Stay (HOSPITAL_COMMUNITY): Payer: Medicaid Other

## 2016-03-03 MED ORDER — NICOTINE 21 MG/24HR TD PT24
21.0000 mg | MEDICATED_PATCH | Freq: Every day | TRANSDERMAL | Status: DC
  Administered 2016-03-03 – 2016-03-04 (×2): 21 mg via TRANSDERMAL
  Filled 2016-03-03 (×2): qty 1

## 2016-03-03 NOTE — Progress Notes (Addendum)
      [address]       [place] [address]             [phone]      12 Days Post-Op Procedure(s) (LRB): LEFT RIB PLATING (Left)   Subjective:  [name] complains of pain today.  His wife is present at bedside.  He evidently had periods of confusion overnight with walking around unit and looking through the nurses station.  Patient is motivated and hopeful to be discharged soon.  Objective: Vital signs in last 24 hours: Temp:  [97.7 F (36.5 C)-98.9 F (37.2 C)] 98.2 F (36.8 C) (05/14 1055) Pulse Rate:  [83-118] 118 (05/14 1055) Cardiac Rhythm:  [-] Normal sinus rhythm (05/13 2021) Resp:  [16-20] 16 (05/14 1055) BP: (115-144)/(66-90) 131/84 mmHg (05/14 1055) SpO2:  [95 %-100 %] 98 % (05/14 1055) FiO2 (%):  [35 %] 35 % (05/14 0919)  Intake/Output from previous day: 05/13 0701 - 05/14 0700 In: 1200 [P.O.:960; I.V.:60; NG/GT:180] Out: 2255 [Urine:2225; Chest Tube:30] Intake/Output this shift: Total I/O In: 120 [P.O.:120] Out: -   General appearance: alert, cooperative and no distress Heart: regular rate and rhythm Lungs: clear to auscultation bilaterally Abdomen: soft, non-tender; bowel sounds normal; no masses,  no organomegaly Wound: clean and dry, dressings changed over chest tube sites... However can leave open to air if not draining.  Lab Results:  Recent Labs  03/01/16 0540  WBC 8.2  HGB 9.3*  HCT 28.5*  PLT 530*   BMET: No results for input(s): NA, K, CL, CO2, GLUCOSE, BUN, CREATININE, CALCIUM in the last 72 hours.  PT/INR: No results for input(s): LABPROT, INR in the last 72 hours. ABG    Component Value Date/Time   PHART 7.404 02/25/2016 1324   HCO3 25.0* 02/25/2016 1324   TCO2 26 02/25/2016 1324   ACIDBASEDEF 1.0 02/17/2016 0435   O2SAT 96.0 02/25/2016 1324   CBG (last 3)   Recent Labs  03/02/16 0343 03/02/16 0814 03/02/16 1152  GLUCAP 100* 134* 123*    Assessment/Plan: S/P Procedure(s) (LRB): LEFT RIB PLATING  (Left)  1. Pulm- CXR is stable, some atelectasis present, rib fractures stable, plates in place... Tolerating trach with speaking valve may be able to downside too 2. Post Operative Delirium- patient is confused at night, up searching through drawers, unsteady on feet per wife... Could be related to narcotic use and typical post ICU delirium will monitor 3. Dispo- patient stable, care per primary, CXR is stable   LOS: 17 days    BARRETT, ERIN 03/03/2016 Chest wounds healing well Knows where he is and answers questions appropriately confusion at night I have seen and examined [name] and agree with the above assessment  and plan.  Grace Isaac MD Beeper [phone] Office [phone] 03/03/2016 12:15 PM

## 2016-03-03 NOTE — Progress Notes (Signed)
Spoke with Hassan Rowan RN regarding PICC.  Grey port very sluggish and white port occluded.  Red port running IVF at 10 ml/hr.  RN to ask MD next action to take with PICC.  Remove PICC, TpA the two ports or exchange PICC for fewer lumens.  RN to advise VAS team of order from physician.
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
## 2016-03-04 MED ORDER — NAPROXEN 250 MG PO TABS
500.0000 mg | ORAL_TABLET | Freq: Two times a day (BID) | ORAL | Status: DC
  Administered 2016-03-04 – 2016-03-05 (×2): 500 mg via ORAL
  Filled 2016-03-04 (×2): qty 2

## 2016-03-04 MED ORDER — PANTOPRAZOLE SODIUM 40 MG PO PACK
40.0000 mg | PACK | Freq: Every day | ORAL | Status: DC
  Filled 2016-03-04: qty 20

## 2016-03-04 MED ORDER — OXYCODONE HCL 5 MG PO TABS
10.0000 mg | ORAL_TABLET | ORAL | Status: DC | PRN
  Administered 2016-03-04 – 2016-03-05 (×4): 20 mg via ORAL
  Filled 2016-03-04 (×4): qty 4

## 2016-03-04 MED ORDER — ENSURE ENLIVE PO LIQD
237.0000 mL | Freq: Two times a day (BID) | ORAL | Status: DC
  Administered 2016-03-04: 237 mL via ORAL

## 2016-03-04 MED ORDER — TRAMADOL HCL 50 MG PO TABS
100.0000 mg | ORAL_TABLET | Freq: Four times a day (QID) | ORAL | Status: DC
  Administered 2016-03-04 – 2016-03-05 (×3): 100 mg via ORAL
  Filled 2016-03-04 (×3): qty 2

## 2016-03-04 MED ORDER — QUETIAPINE FUMARATE 100 MG PO TABS
200.0000 mg | ORAL_TABLET | Freq: Two times a day (BID) | ORAL | Status: DC
  Administered 2016-03-04: 200 mg
  Filled 2016-03-04: qty 2

## 2016-03-04 MED ORDER — LORATADINE 10 MG PO TABS
10.0000 mg | ORAL_TABLET | Freq: Every day | ORAL | Status: DC
  Administered 2016-03-04: 10 mg via ORAL
  Filled 2016-03-04: qty 1

## 2016-03-04 MED ORDER — PANTOPRAZOLE SODIUM 40 MG PO TBEC: 40.0000 mg | DELAYED_RELEASE_TABLET | Freq: Every day | ORAL | Status: DC

## 2016-03-04 MED ORDER — ADULT MULTIVITAMIN W/MINERALS CH
1.0000 | ORAL_TABLET | Freq: Every day | ORAL | Status: DC
  Administered 2016-03-04: 1 via ORAL
  Filled 2016-03-04: qty 1

## 2016-03-04 MED ORDER — HYDROMORPHONE HCL 1 MG/ML IJ SOLN
0.5000 mg | INTRAMUSCULAR | Status: DC | PRN
  Administered 2016-03-04 (×2): 0.5 mg via INTRAVENOUS
  Filled 2016-03-04 (×3): qty 1
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
## 2016-03-04 NOTE — Progress Notes (Signed)
Pt tolerated trach being capped without signs of distress.  D/C trach without incident.  Pt tolerated procedure well.

## 2016-03-04 NOTE — Progress Notes (Signed)
I met with pt at bedside as he is ambulating in room after trach capped by Respiratory. I advised him to sit in recliner as he gets accustomed to trach capped. He states he and his wife plan for him to d/c home likely tomorrow. I will follow up after therapy has worked with him as he seems to have  Progressed well  functionally this weekend. [phone]
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
## 2016-03-04 NOTE — Progress Notes (Signed)
Noted pt's progress with PT and OT. No PT follow up needs recommended. Pt not in need of an inpt rehab admission at his current level. I do recommend Outpt OT and SLP follow up at discharge. I will sign off. [phone]

## 2016-03-04 NOTE — Progress Notes (Signed)
Trach capped @ 10:00 AM.  RN aware, will monitor and advise RT if distress occurs.

## 2016-03-04 NOTE — Progress Notes (Signed)
      [address]       ,[place] [address]             [phone]      13 Days Post-Op Procedure(s) (LRB): LEFT RIB PLATING (Left)   Subjective:  No new complaints.  Trach downsized earlier today.  Hoping to be discharged home soon  Objective: Vital signs in last 24 hours: Temp:  [98.2 F (36.8 C)-98.9 F (37.2 C)] 98.5 F (36.9 C) (05/15 0441) Pulse Rate:  [89-122] 122 (05/15 0441) Cardiac Rhythm:  [-] Normal sinus rhythm (05/14 2000) Resp:  [16-18] 18 (05/15 0441) BP: (116-136)/(64-89) 116/71 mmHg (05/15 0441) SpO2:  [95 %-100 %] 97 % (05/15 0825) FiO2 (%):  [35 %] 35 % (05/15 0825)  Intake/Output from previous day: 05/14 0701 - 05/15 0700 In: 560 [P.O.:360; I.V.:80; IV Piggyback:120] Out: 200 [Urine:200] Intake/Output this shift: Total I/O In: 120 [P.O.:120] Out: 200 [Urine:200]  General appearance: alert, cooperative and no distress Heart: regular rate and rhythm Lungs: clear to auscultation bilaterally Abdomen: soft, non-tender; bowel sounds normal; no masses,  no organomegaly Wound: clean andd ry  Lab Results: No results for input(s): WBC, HGB, HCT, PLT in the last 72 hours. BMET: No results for input(s): NA, K, CL, CO2, GLUCOSE, BUN, CREATININE, CALCIUM in the last 72 hours.  PT/INR: No results for input(s): LABPROT, INR in the last 72 hours. ABG    Component Value Date/Time   PHART 7.404 02/25/2016 1324   HCO3 25.0* 02/25/2016 1324   TCO2 26 02/25/2016 1324   ACIDBASEDEF 1.0 02/17/2016 0435   O2SAT 96.0 02/25/2016 1324   CBG (last 3)   Recent Labs  03/02/16 0343 03/02/16 0814 03/02/16 1152  GLUCAP 100* 134* 123*    Assessment/Plan: S/P Procedure(s) (LRB): LEFT RIB PLATING (Left)  1. Pulm- CXR remains stable, rib plates are stable, bilateral atelectasis... Trach downsized this morning 2. Dispo- patient stable, care per trauma.. Will sign off, can remove sutures prior to discharge will see patient in our office in 2 weeks  with CXR .Marland Kitchen appt placed in chart   LOS: 18 days    Keriann Rankin, Junie Panning 03/04/2016

## 2016-03-05 LAB — BASIC METABOLIC PANEL
Anion gap: 12 (ref 5–15)
BUN: 17 mg/dL (ref 6–20)
CHLORIDE: 102 mmol/L (ref 101–111)
CO2: 24 mmol/L (ref 22–32)
Calcium: 9.1 mg/dL (ref 8.9–10.3)
Creatinine, Ser: 0.98 mg/dL (ref 0.61–1.24)
GFR calc Af Amer: >60 mL/min (ref >60)
GFR calc non Af Amer: >60 mL/min (ref >60)
GLUCOSE: 101 mg/dL — AB (ref 65–99)
POTASSIUM: 4.5 mmol/L (ref 3.5–5.1)
Sodium: 138 mmol/L (ref 135–145)

## 2016-03-05 MED ORDER — NAPROXEN 500 MG PO TABS: 500.0000 mg | ORAL_TABLET | Freq: Two times a day (BID) | ORAL | Status: DC

## 2016-03-05 MED ORDER — OXYCODONE-ACETAMINOPHEN 10-325 MG PO TABS: 1.0000 | ORAL_TABLET | ORAL | Status: DC | PRN

## 2016-03-05 MED ORDER — TRAMADOL HCL 50 MG PO TABS: 100.0000 mg | ORAL_TABLET | Freq: Four times a day (QID) | ORAL | Status: DC

## 2016-03-05 MED FILL — OXYCODONE-APAP 10-325 TAB: 10-325 | 14 days supply | Qty: 168 | Fill #0

## 2016-03-05 MED FILL — traMADol HCL 50 MG TABS: 50 | 14 days supply | Qty: 112 | Fill #0

## 2016-03-05 MED FILL — NAPROXEN 500 MG TABLET: 500 | 34 days supply | Qty: 68 | Fill #0

## 2016-03-05 NOTE — Discharge Instructions (Signed)
Thoracotomy, Care After °Refer to this sheet in the next few weeks. These instructions provide you with information on caring for yourself after your procedure. Your health care provider may also give you more specific instructions. Your treatment has been planned according to current medical practices, but problems sometimes occur. Call your health care provider if you have any problems or questions after your procedure. °WHAT TO EXPECT AFTER YOUR PROCEDURE °After your procedure, it is typical to have the following sensations: °· You may feel pain at the incision site. °· You may be constipated from the pain medicine given and the change in your level of activity. °· You may feel extremely tired. °HOME CARE INSTRUCTIONS °· Take over-the-counter or prescription medicines for pain, discomfort, or fever only as directed by your health care provider. It is very important to take pain relieving medicine before your pain becomes severe. You will be able to breathe and cough more comfortably if your pain is well controlled. °· Take deep breaths. Deep breathing helps to keep your lungs inflated and protects against a lung infection (pneumonia). °· Cough frequently. Even though coughing may cause discomfort, coughing is important to clear mucus (phlegm) and expand your lungs. Coughing helps prevent pneumonia. If it hurts to cough, hold a pillow against your chest when you cough. This may help with the discomfort. °· Continue to use an incentive spirometer as directed. The use of an incentive spirometer helps to keep your lungs inflated and protects against pneumonia. °· Change the bandages over your incision as needed or as directed by your health care provider. °· Remove the bandages over your chest tube site as directed by your health care provider. °· Resume your normal diet as directed. It is important to have adequate protein, calories, vitamins, and minerals to promote healing. °· Prevent constipation. °¨ Eat  high-fiber foods such as whole grain cereals and breads, brown rice, beans, and fresh fruits and vegetables. °¨ Drink enough water and fluids to keep your urine clear or pale yellow. Avoid drinking beverages containing caffeine. Beverages containing caffeine can cause dehydration and harden your stool. °¨ Talk to your health care provider about taking a stool softener or laxative. °· Avoid lifting until you are instructed otherwise. °· Do not drive until directed by your health care provider.  Do not drive while taking pain medicines (narcotics). °· Do not bathe, swim, or use a hot tub until directed by your health care provider. You may shower instead. Gently wash the area of your incision with water and soap as directed. Do not use anything else to clean your incision except as directed by your health care provider. °· Do not use any tobacco products including cigarettes, chewing tobacco, or electronic cigarettes. °· Avoid secondhand smoke. °· Schedule an appointment for stitch (suture) or staple removal as directed. °· Schedule and attend all follow-up visits as directed by your health care provider. It is important to keep all your appointments. °· Participate in pulmonary rehabilitation as directed by your health care provider. °· Do not travel by airplane for 2 weeks after your chest tube is removed. °SEEK MEDICAL CARE IF: °· You are bleeding from your wounds. °· Your heartbeat seems irregular. °· You have redness, swelling, or increasing pain in the wounds. °· There is pus coming from your wounds. °· There is a bad smell coming from the wound or dressing. °· You have a fever or chills. °· You have nausea or are vomiting. °· You have muscle aches. °SEEK   IMMEDIATE MEDICAL CARE IF:  You have a rash.  You have difficulty breathing.  You have a reaction or side effect to medicines given.  You have persistent nausea.  You have lightheadedness or feel faint.  You have shortness of breath or chest  pain.  You have persistent pain.   This information is not intended to replace advice given to you by your health care provider. Make sure you discuss any questions you have with your health care provider.   Document Released: 03/22/2011 Document Revised: 02/21/2015 Document Reviewed: 05/26/2013 Elsevier Interactive Patient Education [employer].  No driving while taking oxycodone.

## 2016-03-05 NOTE — Care Management Note (Signed)
Case Management Note  Patient Details  Name: [name] MRN: [id number] Date of Birth: [birth year]  Subjective/Objective:  Pt medically stable for dc home with family today.  He has made great progress, and is ambulating well without assistive device.                    Action/Plan: Pt is uninsured, but is eligible for medication assistance through [place] letter given with explanation of program benefits.  Referral to [facility] for OP OT and Speech therapy.  Pt appreciative of help given.    Expected Discharge Date:    03/05/16              Expected Discharge Plan:  Home/Self Care  In-House Referral:  Clinical Social Work  Discharge planning Services  CM Consult  Post Acute Care Choice:    Choice offered to:     DME Arranged:    DME Agency:     HH Arranged:    HH Agency:     Status of Service:  Completed, signed off  Medicare Important Message Given:    Date Medicare IM Given:    Medicare IM give by:    Date Additional Medicare IM Given:    Additional Medicare Important Message give by:     If discussed at [place] of Stay Meetings, dates discussed:    Additional Comments:  Reinaldo Raddle, RN, BSN  Trauma/Neuro ICU Case Manager [phone]

## 2016-03-05 NOTE — Progress Notes (Signed)
Patient discharged home with no concerns with wife present. Acquanetta Chain, RN
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
## 2016-03-05 NOTE — Progress Notes (Signed)
14 Days Post-Op  Subjective: He is doing well. He has had some solid food and I had instructed to stay on soft diet. He is having some pain in the mandible when trying to chew. There is no swelling. He is numb on the left lower lip and that is no surprising.  Objective: Vital signs in last 24 hours: Temp:  [98.4 F (36.9 C)-98.9 F (37.2 C)] 98.9 F (37.2 C) (05/16 0543) Pulse Rate:  [87-98] 95 (05/16 0543) Resp:  [18-19] 18 (05/16 0543) BP: (123-133)/(69-80) 133/80 mmHg (05/16 0543) SpO2:  [95 %-98 %] 98 % (05/16 0543) Last BM Date: 03/03/16  Intake/Output from previous day: 05/15 0701 - 05/16 0700 In: 360 [P.O.:360] Out: 2850 [Urine:2850] Intake/Output this shift:    his bolts are still in. he is complaining about pain in the right upper maxilla and the left lower mandible screws. there is no swelling and no point tenderness of the left mandible area. he is numb of the left lip. trach site covered.  Lab Results:  No results for input(s): WBC, HGB, HCT, PLT in the last 72 hours. BMET  Recent Labs  03/05/16 0510  NA 138  K 4.5  CL 102  CO2 24  GLUCOSE 101*  BUN 17  CREATININE 0.98  CALCIUM 9.1   PT/INR No results for input(s): LABPROT, INR in the last 72 hours. ABG No results for input(s): PHART, HCO3 in the last 72 hours.  Invalid input(s): PCO2, PO2  Studies/Results: No results found.  Anti-infectives: Anti-infectives    Start     Dose/Rate Route Frequency Ordered Stop   02/21/16 0500  vancomycin (VANCOCIN) IVPB 1000 mg/200 mL premix     1,000 mg 200 mL/hr over 60 Minutes Intravenous Every 12 hours 02/20/16 1944 02/21/16 1712   02/20/16 1804  vancomycin (VANCOCIN) 1,000 mg in sodium chloride 0.9 % 1,000 mL irrigation  Status:  Discontinued       As needed 02/20/16 1804 02/20/16 1909   02/20/16 1745  vancomycin (VANCOCIN) 1,000 mg in sodium chloride 0.9 % 1,000 mL irrigation  Status:  Discontinued      Irrigation To Surgery 02/20/16 1736 02/21/16 1925   02/20/16 1600  piperacillin-tazobactam (ZOSYN) IVPB 3.375 g  Status:  Discontinued     3.375 g 12.5 mL/hr over 240 Minutes Intravenous Every 8 hours 02/20/16 0749 02/20/16 0810   02/20/16 1530  vancomycin (VANCOCIN) 1,000 mg in sodium chloride 0.9 % 250 mL IVPB  Status:  Discontinued     1,000 mg 250 mL/hr over 60 Minutes Intravenous Every 12 hours 02/20/16 1516 02/20/16 1530   02/20/16 1300  cefUROXime (ZINACEF) 1.5 g in dextrose 5 % 50 mL IVPB  Status:  Discontinued     1.5 g 100 mL/hr over 30 Minutes Intravenous 60 min pre-op 02/19/16 1504 02/20/16 0806   02/20/16 0830  levofloxacin (LEVAQUIN) IVPB 750 mg  Status:  Discontinued     750 mg 100 mL/hr over 90 Minutes Intravenous Every 24 hours 02/20/16 0812 02/20/16 0822   02/20/16 0830  Ampicillin-Sulbactam (UNASYN) 3 g in sodium chloride 0.9 % 100 mL IVPB  Status:  Discontinued     3 g 100 mL/hr over 60 Minutes Intravenous Every 6 hours 02/20/16 0825 03/01/16 0843   02/20/16 0800  piperacillin-tazobactam (ZOSYN) IVPB 3.375 g  Status:  Discontinued     3.375 g 100 mL/hr over 30 Minutes Intravenous  Once 02/20/16 0747 02/20/16 0810   02/16/16 1500  clindamycin (CLEOCIN) IVPB 600 mg  Status:  Discontinued     600 mg 100 mL/hr over 30 Minutes Intravenous Every 6 hours 02/16/16 1420 02/20/16 0747   02/15/16 0144  ceFAZolin (ANCEF) 2-4 GM/100ML-% IVPB    Comments:  Albright, Cassandra : cabinet override      02/15/16 0144 02/15/16 1359   02/15/16 0100  ceFAZolin (ANCEF) IVPB 1 g/50 mL premix  Status:  Discontinued     1 g 100 mL/hr over 30 Minutes Intravenous Every 6 hours 02/15/16 0049 02/19/16 0754   02/15/16 0045  ceFAZolin (ANCEF) IVPB 2g/100 mL premix  Status:  Discontinued     2 g 200 mL/hr over 30 Minutes Intravenous Every 8 hours 02/15/16 0043 02/15/16 0236      Assessment/Plan: s/p Procedure(s): LEFT RIB PLATING (Left) he needs to stay on soft diet for 3 weeks. we discussed the bolt removal but want to wait to be sure he is  not going to need to be wired if he continues to have pain. He should continue clindamycin or keflex as outpatient and follow up with me next week sooner if any swelling of the mandible, increased pain, or redness.   LOS: 19 days    Melissa Montane 03/05/2016

## 2016-03-11 ENCOUNTER — Other Ambulatory Visit: Payer: Self-pay | Admitting: Cardiothoracic Surgery

## 2016-03-11 DIAGNOSIS — S2220XD Unspecified fracture of sternum, subsequent encounter for fracture with routine healing: Secondary | ICD-10-CM

## 2016-03-12 ENCOUNTER — Ambulatory Visit
Admission: RE | Admit: 2016-03-12 | Discharge: 2016-03-12 | Disposition: A | Discharge status: Home / Self Care | Payer: No Typology Code available for payment source | Source: Ambulatory Visit | Attending: Cardiothoracic Surgery | Admitting: Cardiothoracic Surgery

## 2016-03-12 ENCOUNTER — Ambulatory Visit (INDEPENDENT_AMBULATORY_CARE_PROVIDER_SITE_OTHER): Payer: Self-pay | Admitting: Cardiothoracic Surgery

## 2016-03-12 ENCOUNTER — Encounter: Payer: Self-pay | Admitting: Cardiothoracic Surgery

## 2016-03-12 VITALS — BP 134/84 | HR 94 | Resp 20 | Ht 72.0 in | Wt 248.0 lb

## 2016-03-12 DIAGNOSIS — S2249XA Multiple fractures of ribs, unspecified side, initial encounter for closed fracture: Secondary | ICD-10-CM

## 2016-03-12 DIAGNOSIS — S22029S Unspecified fracture of second thoracic vertebra, sequela: Secondary | ICD-10-CM

## 2016-03-12 DIAGNOSIS — S2220XD Unspecified fracture of sternum, subsequent encounter for fracture with routine healing: Secondary | ICD-10-CM
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
## 2016-03-13 ENCOUNTER — Encounter (HOSPITAL_COMMUNITY): Payer: Self-pay | Admitting: Cardiothoracic Surgery

## 2016-03-15 ENCOUNTER — Other Ambulatory Visit: Payer: Self-pay

## 2016-03-15 DIAGNOSIS — G8918 Other acute postprocedural pain: Secondary | ICD-10-CM

## 2016-03-15 MED ORDER — OXYCODONE-ACETAMINOPHEN 10-325 MG PO TABS: 1.0000 | ORAL_TABLET | Freq: Three times a day (TID) | ORAL | Status: DC | PRN

## 2016-03-20 ENCOUNTER — Ambulatory Visit: Payer: Self-pay | Admitting: Cardiothoracic Surgery

## 2016-03-24 ENCOUNTER — Emergency Department (HOSPITAL_COMMUNITY): Payer: Medicaid Other

## 2016-03-24 ENCOUNTER — Encounter (HOSPITAL_COMMUNITY): Payer: Self-pay

## 2016-03-24 DIAGNOSIS — F329 Major depressive disorder, single episode, unspecified: Secondary | ICD-10-CM | POA: Insufficient documentation

## 2016-03-24 DIAGNOSIS — F17210 Nicotine dependence, cigarettes, uncomplicated: Secondary | ICD-10-CM | POA: Diagnosis not present

## 2016-03-24 DIAGNOSIS — R0781 Pleurodynia: Secondary | ICD-10-CM | POA: Diagnosis present

## 2016-03-24 DIAGNOSIS — Z79899 Other long term (current) drug therapy: Secondary | ICD-10-CM | POA: Insufficient documentation

## 2016-03-24 DIAGNOSIS — F419 Anxiety disorder, unspecified: Secondary | ICD-10-CM | POA: Insufficient documentation

## 2016-03-24 DIAGNOSIS — Z8619 Personal history of other infectious and parasitic diseases: Secondary | ICD-10-CM | POA: Insufficient documentation

## 2016-03-24 DIAGNOSIS — S20212D Contusion of left front wall of thorax, subsequent encounter: Secondary | ICD-10-CM | POA: Insufficient documentation

## 2016-03-24 DIAGNOSIS — Z85528 Personal history of other malignant neoplasm of kidney: Secondary | ICD-10-CM | POA: Diagnosis not present

## 2016-03-24 DIAGNOSIS — Z9852 Vasectomy status: Secondary | ICD-10-CM | POA: Insufficient documentation

## 2016-03-24 DIAGNOSIS — Z791 Long term (current) use of non-steroidal anti-inflammatories (NSAID): Secondary | ICD-10-CM | POA: Diagnosis not present

## 2016-03-24 DIAGNOSIS — Z872 Personal history of diseases of the skin and subcutaneous tissue: Secondary | ICD-10-CM | POA: Insufficient documentation

## 2016-03-24 MED ORDER — OXYCODONE-ACETAMINOPHEN 5-325 MG PO TABS
ORAL_TABLET | ORAL | Status: AC
  Administered 2016-03-25: 1
  Filled 2016-03-24: qty 1

## 2016-03-24 MED ORDER — OXYCODONE-ACETAMINOPHEN 5-325 MG PO TABS
1.0000 | ORAL_TABLET | ORAL | Status: AC | PRN
  Administered 2016-03-24 – 2016-03-25 (×2): 1 via ORAL
  Filled 2016-03-24 (×2): qty 1
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
## 2016-03-25 ENCOUNTER — Emergency Department (HOSPITAL_COMMUNITY): Payer: Medicaid Other

## 2016-03-25 ENCOUNTER — Encounter (HOSPITAL_COMMUNITY): Payer: Self-pay | Admitting: Radiology

## 2016-03-25 ENCOUNTER — Emergency Department (HOSPITAL_COMMUNITY)
Admission: EM | Admit: 2016-03-25 | Discharge: 2016-03-25 | Disposition: A | Discharge status: Home / Self Care | Payer: Medicaid Other | Attending: Emergency Medicine | Admitting: Emergency Medicine

## 2016-03-25 DIAGNOSIS — S20212D Contusion of left front wall of thorax, subsequent encounter: Secondary | ICD-10-CM

## 2016-03-25 DIAGNOSIS — S02609A Fracture of mandible, unspecified, initial encounter for closed fracture: Secondary | ICD-10-CM

## 2016-03-25 LAB — COMPREHENSIVE METABOLIC PANEL
ALT: 14 U/L — ABNORMAL LOW (ref 17–63)
ANION GAP: 5 (ref 5–15)
AST: 15 U/L (ref 15–41)
Albumin: 3.4 g/dL — ABNORMAL LOW (ref 3.5–5.0)
Alkaline Phosphatase: 79 U/L (ref 38–126)
BUN: 11 mg/dL (ref 6–20)
CHLORIDE: 109 mmol/L (ref 101–111)
CO2: 26 mmol/L (ref 22–32)
Calcium: 8.6 mg/dL — ABNORMAL LOW (ref 8.9–10.3)
Creatinine, Ser: 0.8 mg/dL (ref 0.61–1.24)
Glucose, Bld: 114 mg/dL — ABNORMAL HIGH (ref 65–99)
POTASSIUM: 3.5 mmol/L (ref 3.5–5.1)
Sodium: 140 mmol/L (ref 135–145)
Total Bilirubin: 0.5 mg/dL (ref 0.3–1.2)
Total Protein: 5.7 g/dL — ABNORMAL LOW (ref 6.5–8.1)

## 2016-03-25 LAB — CBC
HCT: 35.2 % — ABNORMAL LOW (ref 39.0–52.0)
Hemoglobin: 11.5 g/dL — ABNORMAL LOW (ref 13.0–17.0)
MCH: 29.6 pg (ref 26.0–34.0)
MCHC: 32.7 g/dL (ref 30.0–36.0)
MCV: 90.7 fL (ref 78.0–100.0)
PLATELETS: 194 10*3/uL (ref 150–400)
RBC: 3.88 MIL/uL — ABNORMAL LOW (ref 4.22–5.81)
RDW: 13.9 % (ref 11.5–15.5)
WBC: 6.7 10*3/uL (ref 4.0–10.5)

## 2016-03-25 LAB — LIPASE, BLOOD: LIPASE: 48 U/L (ref 11–51)

## 2016-03-25 MED ORDER — SODIUM CHLORIDE 0.9 % IV BOLUS (SEPSIS)
500.0000 mL | Freq: Once | INTRAVENOUS | Status: AC
  Administered 2016-03-25: 500 mL via INTRAVENOUS

## 2016-03-25 MED ORDER — IOPAMIDOL (ISOVUE-300) INJECTION 61%
INTRAVENOUS | Status: AC
  Administered 2016-03-25: 100 mL
  Filled 2016-03-25: qty 100

## 2016-03-25 MED ORDER — METHOCARBAMOL 750 MG PO TABS: 750.0000 mg | ORAL_TABLET | Freq: Two times a day (BID) | ORAL | Status: DC

## 2016-03-25 MED ORDER — OXYCODONE-ACETAMINOPHEN 5-325 MG PO TABS: 1.0000 | ORAL_TABLET | ORAL | Status: DC | PRN

## 2016-03-25 MED ORDER — ONDANSETRON HCL 4 MG/2ML IJ SOLN
4.0000 mg | Freq: Once | INTRAMUSCULAR | Status: DC
  Filled 2016-03-25: qty 2

## 2016-03-25 MED ORDER — HYDROMORPHONE HCL 1 MG/ML IJ SOLN
1.0000 mg | Freq: Once | INTRAMUSCULAR | Status: AC
  Administered 2016-03-25: 1 mg via INTRAVENOUS
  Filled 2016-03-25: qty 1

## 2016-03-25 NOTE — Discharge Instructions (Signed)
Chest Contusion A chest contusion is a deep bruise on your chest area. Contusions are the result of an injury that caused bleeding under the skin. A chest contusion may involve bruising of the skin, muscles, or ribs. The contusion may turn blue, purple, or yellow. Minor injuries will give you a painless contusion, but more severe contusions may stay painful and swollen for a few weeks. CAUSES  A contusion is usually caused by a blow, trauma, or direct force to an area of the body. SYMPTOMS   Swelling and redness of the injured area.  Discoloration of the injured area.  Tenderness and soreness of the injured area.  Pain. DIAGNOSIS  The diagnosis can be made by taking a history and performing a physical exam. An X-ray, CT scan, or MRI may be needed to determine if there were any associated injuries, such as broken bones (fractures) or internal injuries. TREATMENT  Often, the best treatment for a chest contusion is resting, icing, and applying cold compresses to the injured area. Deep breathing exercises may be recommended to reduce the risk of pneumonia. Over-the-counter medicines may also be recommended for pain control. HOME CARE INSTRUCTIONS   Put ice on the injured area.  Put ice in a plastic bag.  Place a towel between your skin and the bag.  Leave the ice on for 15-20 minutes, 03-04 times a day.  Only take over-the-counter or prescription medicines as directed by your caregiver. Your caregiver may recommend avoiding anti-inflammatory medicines (aspirin, ibuprofen, and naproxen) for 48 hours because these medicines may increase bruising.  Rest the injured area.  Perform deep-breathing exercises as directed by your caregiver.  Stop smoking if you smoke.  Do not lift objects over 5 pounds (2.3 kg) for 3 days or longer if recommended by your caregiver. SEEK IMMEDIATE MEDICAL CARE IF:   You have increased bruising or swelling.  You have pain that is getting worse.  You have  difficulty breathing.  You have dizziness, weakness, or fainting.  You have blood in your urine or stool.  You cough up or vomit blood.  Your swelling or pain is not relieved with medicines. MAKE SURE YOU:   Understand these instructions.  Will watch your condition.  Will get help right away if you are not doing well or get worse.   This information is not intended to replace advice given to you by your health care provider. Make sure you discuss any questions you have with your health care provider.   Document Released: 07/02/2001 Document Revised: 07/01/2012 Document Reviewed: 03/30/2012 Elsevier Interactive Patient Education 2016 [employer] Inc[employer]  
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
## 2016-03-25 NOTE — ED Notes (Signed)
Pt taken to CT.

## 2016-03-25 NOTE — ED Notes (Signed)
Pt taken to Xray.

## 2016-03-29 ENCOUNTER — Other Ambulatory Visit: Payer: Self-pay | Admitting: Physician Assistant

## 2016-03-29 NOTE — Telephone Encounter (Signed)
Refill sent to the pharmacy 

## 2016-04-15 ENCOUNTER — Other Ambulatory Visit: Payer: Self-pay | Admitting: *Deleted

## 2016-04-15 DIAGNOSIS — G8918 Other acute postprocedural pain: Secondary | ICD-10-CM

## 2016-04-15 MED ORDER — OXYCODONE-ACETAMINOPHEN 5-325 MG PO TABS: 1.0000 | ORAL_TABLET | ORAL | Status: DC | PRN

## 2016-04-15 MED ORDER — METHOCARBAMOL 750 MG PO TABS: 750.0000 mg | ORAL_TABLET | Freq: Two times a day (BID) | ORAL | Status: DC

## 2016-04-16 ENCOUNTER — Other Ambulatory Visit: Payer: Self-pay | Admitting: Cardiothoracic Surgery

## 2016-04-16 DIAGNOSIS — S2220XA Unspecified fracture of sternum, initial encounter for closed fracture: Secondary | ICD-10-CM

## 2016-04-17 ENCOUNTER — Encounter: Payer: Self-pay | Admitting: Cardiothoracic Surgery

## 2016-04-21 ENCOUNTER — Other Ambulatory Visit: Payer: Self-pay | Admitting: Physician Assistant

## 2016-04-22 NOTE — Telephone Encounter (Signed)
Rx request to pharmacy/SLS Requested drug refills are authorized, however, the patient needs further evaluation and/or laboratory testing before further refills are given. Ask him to make an appointment for this.  Please call patient and schedule ED Follow-up office visit per provider/SLS 07/05

## 2016-05-08 ENCOUNTER — Encounter: Payer: Self-pay | Admitting: Cardiothoracic Surgery

## 2016-05-15 ENCOUNTER — Other Ambulatory Visit: Payer: Self-pay | Admitting: Cardiothoracic Surgery

## 2016-05-15 DIAGNOSIS — S2220XD Unspecified fracture of sternum, subsequent encounter for fracture with routine healing: Secondary | ICD-10-CM

## 2016-05-16 ENCOUNTER — Encounter: Payer: Self-pay | Admitting: Physician Assistant

## 2016-05-16 ENCOUNTER — Ambulatory Visit (INDEPENDENT_AMBULATORY_CARE_PROVIDER_SITE_OTHER): Payer: Self-pay | Admitting: Physician Assistant

## 2016-05-16 ENCOUNTER — Ambulatory Visit
Admission: RE | Admit: 2016-05-16 | Discharge: 2016-05-16 | Disposition: A | Discharge status: Home / Self Care | Payer: Medicaid Other | Source: Ambulatory Visit | Attending: Cardiothoracic Surgery | Admitting: Cardiothoracic Surgery

## 2016-05-16 VITALS — BP 136/95 | HR 93 | Resp 16 | Ht 73.0 in | Wt 232.0 lb

## 2016-05-16 DIAGNOSIS — Z09 Encounter for follow-up examination after completed treatment for conditions other than malignant neoplasm: Secondary | ICD-10-CM

## 2016-05-16 DIAGNOSIS — S2220XD Unspecified fracture of sternum, subsequent encounter for fracture with routine healing: Secondary | ICD-10-CM

## 2016-05-16 DIAGNOSIS — S2232XD Fracture of one rib, left side, subsequent encounter for fracture with routine healing: Secondary | ICD-10-CM

## 2016-05-16 DIAGNOSIS — G8918 Other acute postprocedural pain: Secondary | ICD-10-CM

## 2016-05-16 MED ORDER — OXYCODONE-ACETAMINOPHEN 5-325 MG PO TABS: 1.0000 | ORAL_TABLET | Freq: Four times a day (QID) | ORAL | 0 refills | Status: DC | PRN

## 2016-05-16 NOTE — Progress Notes (Signed)
  HPI: Patient returns for routine postoperative follow-up having undergone ORIF left ribs 4-6 on 02/20/2016 by Dr. Prescott Gum. Since hospital discharge the patient reports still with a fair amount of pain along anterior and posterior left side of ribs.   Current Outpatient Prescriptions  Medication Sig Dispense Refill  . EPINEPHrine 0.3 mg/0.3 mL IJ SOAJ injection Inject 0.3 mLs (0.3 mg total) into the muscle once. 1 Device 1  . fish oil-omega-3 fatty acids 1000 MG capsule Take 1 g by mouth daily.    . methocarbamol (ROBAXIN) 750 MG tablet Take 1-2 tablets (750-1,500 mg total) by mouth 2 (two) times daily. 30 tablet 0  . naproxen (NAPROSYN) 500 MG tablet Take 1 tablet (500 mg total) by mouth 2 (two) times daily with a meal. 68 tablet 0  . Omega-3 Fatty Acids (FISH OIL OMEGA-3 PO) Take 1 capsule by mouth daily.    Marland Kitchen oxyCODONE-acetaminophen (PERCOCET) 5-325 MG tablet Take 1-2 tablets by mouth every 4 (four) hours as needed. 30 tablet 0  . phentermine 37.5 MG capsule Take 1 capsule (37.5 mg total) by mouth every morning. (Patient not taking: Reported on 03/25/2016) 30 capsule 0  . sildenafil (REVATIO) 20 MG tablet TAKE 2 TABLETS BY MOUTH FOR ERECTILE DYSFUNCTION. DO NOT TAKE MORE THAN 1 DOSE IN 24 HOURS. 30 tablet 0  . tetrahydrozoline 0.05 % ophthalmic solution Place 1 drop into both eyes 2 (two) times daily as needed (dry eyes).    . traMADol (ULTRAM) 50 MG tablet Take 2 tablets (100 mg total) by mouth every 6 (six) hours. 112 tablet 0  Vital Signs: BP 136/95, HR 93, RR 16, Oxygen saturation 98% on room air  Physical Exam: CV-RRR Pulmonary-Clear Wounds-Clean, dry, and no signs of infection  Diagnostic Tests: PA/LAT CXR: CHEST  2 VIEW COMPARISON:  CT 03/25/2016.  Chest x-ray 03/24/2016 . FINDINGS: Mediastinum and hilar structures normal. Stable cardiomegaly. Mild right base subsegmental atelectasis. No pleural effusion or pneumothorax. Stable postsurgical changes left chest. Stable old  bilateral rib fractures. IMPRESSION: 1. Mild right base subsegmental atelectasis. Stable cardiomegaly. No pulmonary venous congestion. 2. Stable posttraumatic changes and postsurgical changes of the chest. No acute abnormality identified . 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 As discussed with Dr. Prescott Gum, I gave him prescriptions for Flexeril 5 mg po at hs PRN muscle spasm, Oxycodone 5 mg po Q 6 hours PRN severe pain, and Lyrica 75 mg bid. Hopefully, the aforementioned and time will help with PTPS. It should be noted that I discontinue Robaxin, which he said did not help and he has no more anyway. The patient was very emotional as well because he and his wife are separating. I did NOT prescribe him an anxiolytic as he requested. He will return to see Dr. Prescott Gum in 3 weeks.     Todd Skillern, PA-C [facility] [phone]

## 2016-05-17 ENCOUNTER — Other Ambulatory Visit: Payer: Self-pay | Admitting: *Deleted

## 2016-05-17 DIAGNOSIS — M792 Neuralgia and neuritis, unspecified: Secondary | ICD-10-CM

## 2016-05-17 MED ORDER — GABAPENTIN 300 MG PO CAPS: 300.0000 mg | ORAL_CAPSULE | Freq: Two times a day (BID) | ORAL | 1 refills | Status: DC

## 2016-05-19 ENCOUNTER — Other Ambulatory Visit: Payer: Self-pay | Admitting: Physician Assistant

## 2016-05-20 NOTE — Telephone Encounter (Signed)
Rx request to pharmacy/SLS Requested drug refills are authorized, however, the patient needs further evaluation and/or laboratory testing before further refills are given. Ask him to make an appointment for this.  Please call patient and schedule ED Follow-up office visit per provider/SLS 07/31

## 2016-06-04 ENCOUNTER — Other Ambulatory Visit: Payer: Self-pay | Admitting: Physician Assistant

## 2016-06-04 DIAGNOSIS — Z91030 Bee allergy status: Secondary | ICD-10-CM

## 2016-06-05 ENCOUNTER — Encounter: Payer: Self-pay | Admitting: Cardiothoracic Surgery

## 2016-06-05 ENCOUNTER — Ambulatory Visit (INDEPENDENT_AMBULATORY_CARE_PROVIDER_SITE_OTHER): Payer: Medicaid Other | Admitting: Cardiothoracic Surgery

## 2016-06-05 VITALS — BP 154/98 | HR 103 | Resp 16 | Ht 73.0 in | Wt 233.2 lb

## 2016-06-05 DIAGNOSIS — S2232XD Fracture of one rib, left side, subsequent encounter for fracture with routine healing: Secondary | ICD-10-CM | POA: Diagnosis not present

## 2016-06-05 DIAGNOSIS — Z09 Encounter for follow-up examination after completed treatment for conditions other than malignant neoplasm: Secondary | ICD-10-CM

## 2016-06-05 DIAGNOSIS — G8918 Other acute postprocedural pain: Secondary | ICD-10-CM | POA: Diagnosis not present
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
## 2016-06-11 NOTE — Telephone Encounter (Signed)
Patient states that he followed up with his surgeon after his ED visit and he will return to see [name] once he see the surgeon for his last follow up.

## 2016-06-20 ENCOUNTER — Other Ambulatory Visit: Payer: Self-pay | Admitting: Cardiothoracic Surgery

## 2016-07-05 ENCOUNTER — Other Ambulatory Visit: Payer: Self-pay | Admitting: Cardiothoracic Surgery

## 2016-07-06 ENCOUNTER — Other Ambulatory Visit: Payer: Self-pay | Admitting: Physician Assistant

## 2016-07-10 ENCOUNTER — Encounter: Payer: Self-pay | Admitting: Cardiothoracic Surgery

## 2016-07-17 ENCOUNTER — Encounter: Payer: Self-pay | Admitting: Cardiothoracic Surgery

## 2016-07-31 ENCOUNTER — Other Ambulatory Visit: Payer: Self-pay | Admitting: *Deleted

## 2016-07-31 DIAGNOSIS — G8918 Other acute postprocedural pain: Secondary | ICD-10-CM

## 2016-07-31 DIAGNOSIS — M792 Neuralgia and neuritis, unspecified: Secondary | ICD-10-CM

## 2016-07-31 DIAGNOSIS — F419 Anxiety disorder, unspecified: Secondary | ICD-10-CM

## 2016-07-31 MED ORDER — CYCLOBENZAPRINE HCL 10 MG PO TABS: 10.0000 mg | ORAL_TABLET | Freq: Two times a day (BID) | ORAL | 0 refills | Status: DC | PRN

## 2016-07-31 MED ORDER — ALPRAZOLAM 0.5 MG PO TABS: 0.5000 mg | ORAL_TABLET | Freq: Two times a day (BID) | ORAL | 0 refills | Status: DC

## 2016-07-31 MED ORDER — GABAPENTIN 400 MG PO CAPS: 400.0000 mg | ORAL_CAPSULE | Freq: Three times a day (TID) | ORAL | 0 refills | Status: DC

## 2016-08-07 ENCOUNTER — Encounter: Payer: Self-pay | Admitting: Gastroenterology

## 2016-08-07 ENCOUNTER — Ambulatory Visit (INDEPENDENT_AMBULATORY_CARE_PROVIDER_SITE_OTHER): Payer: Medicaid Other | Admitting: Cardiothoracic Surgery

## 2016-08-07 VITALS — BP 140/95 | HR 95 | Resp 20 | Ht 73.0 in | Wt 233.0 lb

## 2016-08-07 DIAGNOSIS — S2242XD Multiple fractures of ribs, left side, subsequent encounter for fracture with routine healing: Secondary | ICD-10-CM

## 2016-08-07 DIAGNOSIS — G8912 Acute post-thoracotomy pain: Secondary | ICD-10-CM
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
## 2016-08-16 ENCOUNTER — Other Ambulatory Visit: Payer: Self-pay | Admitting: Cardiothoracic Surgery

## 2016-08-16 DIAGNOSIS — M792 Neuralgia and neuritis, unspecified: Secondary | ICD-10-CM

## 2016-08-20 ENCOUNTER — Other Ambulatory Visit: Payer: Self-pay | Admitting: Physician Assistant

## 2016-08-20 DIAGNOSIS — Z91030 Bee allergy status: Secondary | ICD-10-CM

## 2016-08-20 NOTE — Telephone Encounter (Signed)
Rx request to pharmacy; [facility] with contact name and number RE: Rx to pharmacy and reiterated need for Ed if any anaphylactic symptoms present/SLS 10/31

## 2016-08-20 NOTE — Telephone Encounter (Signed)
Pt called in to follow up on medication request. He says that he was just stung. He feels okay for now but says that he is allergic and want to prevent a reaction.     CB: [phone]

## 2016-08-23 NOTE — Telephone Encounter (Signed)
Received PA request from [facility] on patient's Epi-Pen; initiated approval process via Cover My Meds and was Denied/SLS

## 2016-10-02 ENCOUNTER — Encounter: Payer: Self-pay | Admitting: Cardiothoracic Surgery

## 2016-10-02 ENCOUNTER — Ambulatory Visit (INDEPENDENT_AMBULATORY_CARE_PROVIDER_SITE_OTHER): Payer: Medicaid Other | Admitting: Cardiothoracic Surgery

## 2016-10-02 VITALS — BP 150/95 | HR 88 | Resp 20 | Ht 73.0 in | Wt 233.0 lb

## 2016-10-02 DIAGNOSIS — G8912 Acute post-thoracotomy pain: Secondary | ICD-10-CM

## 2016-10-02 DIAGNOSIS — Z09 Encounter for follow-up examination after completed treatment for conditions other than malignant neoplasm: Secondary | ICD-10-CM

## 2016-10-02 DIAGNOSIS — S2242XD Multiple fractures of ribs, left side, subsequent encounter for fracture with routine healing: Secondary | ICD-10-CM
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
## 2016-11-03 IMAGING — DX DG ORTHOPANTOGRAM /PANORAMIC
1 series · 1 of 1 positions shown · non-contrast
Comparison: 02/15/2016

CLINICAL DATA: Mandible fracture.

EXAM:
ORTHOPANTOGRAM/PANORAMIC

[view not recorded]
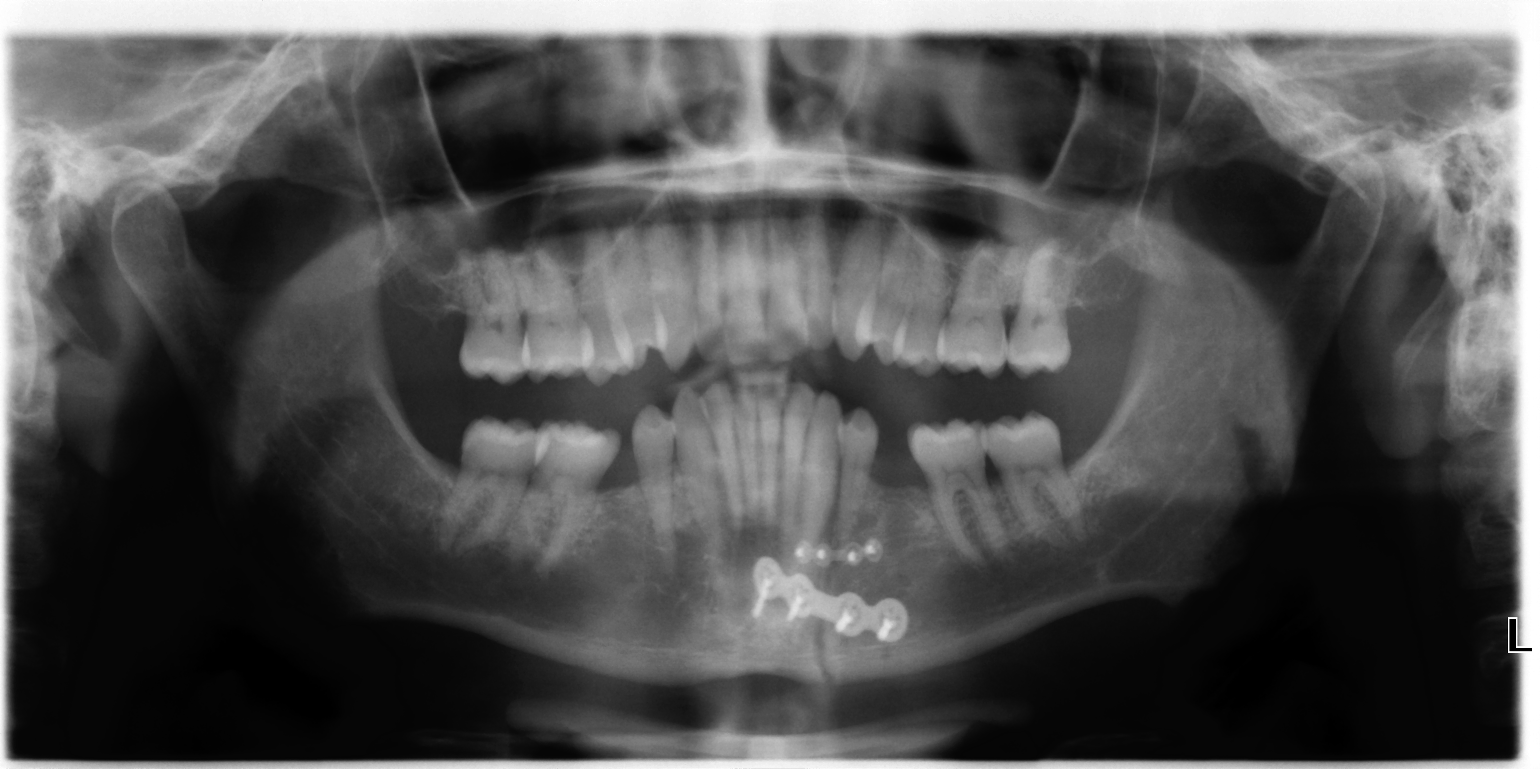

[1 of 1 positions shown; findings below may reference images not displayed]

FINDINGS: Known left mandible fracture status post ORIF. Fracture remains
visible, as expected for recent injury. There is no evidence of
hardware complication. No noted cavity or periapical erosion.
Located mandibular condyles.
IMPRESSION: No acute finding. Still visible left mandible fracture status post
recent ORIF.

## 2016-12-23 ENCOUNTER — Other Ambulatory Visit: Payer: Self-pay | Admitting: Cardiothoracic Surgery

## 2016-12-23 DIAGNOSIS — G8918 Other acute postprocedural pain: Secondary | ICD-10-CM

## 2017-01-01 ENCOUNTER — Ambulatory Visit: Payer: Medicaid Other | Admitting: Cardiothoracic Surgery

## 2017-01-08 ENCOUNTER — Other Ambulatory Visit: Payer: Self-pay | Admitting: Cardiothoracic Surgery

## 2017-01-08 DIAGNOSIS — G8918 Other acute postprocedural pain: Secondary | ICD-10-CM

## 2017-01-15 ENCOUNTER — Other Ambulatory Visit: Payer: Self-pay | Admitting: Cardiothoracic Surgery

## 2017-01-16 ENCOUNTER — Other Ambulatory Visit: Payer: Self-pay | Admitting: *Deleted

## 2017-01-16 DIAGNOSIS — G8918 Other acute postprocedural pain: Secondary | ICD-10-CM

## 2017-01-16 MED ORDER — CYCLOBENZAPRINE HCL 10 MG PO TABS: 10.0000 mg | ORAL_TABLET | Freq: Two times a day (BID) | ORAL | 0 refills | Status: DC | PRN

## 2017-01-22 ENCOUNTER — Ambulatory Visit (INDEPENDENT_AMBULATORY_CARE_PROVIDER_SITE_OTHER): Payer: Medicaid Other | Admitting: Cardiothoracic Surgery

## 2017-01-22 ENCOUNTER — Other Ambulatory Visit: Payer: Self-pay | Admitting: *Deleted

## 2017-01-22 VITALS — BP 144/90 | HR 88 | Resp 20 | Ht 73.0 in | Wt 233.0 lb

## 2017-01-22 DIAGNOSIS — G8912 Acute post-thoracotomy pain: Secondary | ICD-10-CM | POA: Diagnosis not present

## 2017-01-22 DIAGNOSIS — S2242XD Multiple fractures of ribs, left side, subsequent encounter for fracture with routine healing: Secondary | ICD-10-CM | POA: Diagnosis not present

## 2017-01-22 DIAGNOSIS — R0781 Pleurodynia: Secondary | ICD-10-CM
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
## 2017-02-05 ENCOUNTER — Encounter: Payer: Self-pay | Admitting: Cardiothoracic Surgery

## 2017-02-05 ENCOUNTER — Ambulatory Visit
Admission: RE | Admit: 2017-02-05 | Discharge: 2017-02-05 | Disposition: A | Discharge status: Home / Self Care | Payer: Medicaid Other | Source: Ambulatory Visit | Attending: Cardiothoracic Surgery | Admitting: Cardiothoracic Surgery

## 2017-02-05 ENCOUNTER — Ambulatory Visit (INDEPENDENT_AMBULATORY_CARE_PROVIDER_SITE_OTHER): Payer: Medicaid Other | Admitting: Cardiothoracic Surgery

## 2017-02-05 VITALS — BP 139/87 | HR 92 | Resp 16 | Ht 72.0 in | Wt 233.0 lb

## 2017-02-05 DIAGNOSIS — S2242XD Multiple fractures of ribs, left side, subsequent encounter for fracture with routine healing: Secondary | ICD-10-CM

## 2017-02-05 DIAGNOSIS — G8912 Acute post-thoracotomy pain: Secondary | ICD-10-CM | POA: Diagnosis not present

## 2017-02-05 DIAGNOSIS — Z09 Encounter for follow-up examination after completed treatment for conditions other than malignant neoplasm: Secondary | ICD-10-CM | POA: Diagnosis not present

## 2017-02-05 DIAGNOSIS — R0781 Pleurodynia: Secondary | ICD-10-CM

## 2017-02-05 NOTE — Progress Notes (Signed)
PCP is Leeanne Rio, PA-C Referring Provider is Judeth Horn, MD  Chief Complaint  Patient presents with  . Routine Post Op    f/u with CT CHEST    GEX:[id number] follow-up of left rib fractures with plating of 5, 6, 7 ribs laterally Patient complaining of postthoracotomy pain under the left breast line Patient complaining of pain in his upper left back where he had a transverse process fracture of an upper vertebral body. [ T-2] CT scan today shows remote healed old and fixated left rib fractures. No evidence of infection or fluid collection. Remote healed sternal fracture. Some minimal displacement of the seventh rib plating but with healing of the rib fracture. No evidence of metastatic renal cancer-patient is status post partial nephrectomy 2014  Because of persistent pain now almost a year after the injury he will be referred to a pain clinic directed by Dr. Dianna Limbo for further evaluation and management. He states his worst pain is over the left upper midline at the area of the transverse process minimally displaced fracture at T2 Past Medical History:  Diagnosis Date  . Anxiety and depression 08/01/2013  . Cancer ([place])   . Cellulitis    left leg and stomach  . Chicken pox   . Diarrhea 08/01/2013  . History of kidney cancer   . Hx of vasectomy   . Renal cell carcinoma ([place]) 06/01/2013    Past Surgical History:  Procedure Laterality Date  . COLONOSCOPY WITH PROPOFOL N/A 08/19/2013   Procedure: COLONOSCOPY WITH PROPOFOL;  Surgeon: Milus Banister, MD;  [facility]: [facility];  Service: Endoscopy;  Laterality: N/A;  . ESOPHAGOGASTRODUODENOSCOPY (EGD) WITH PROPOFOL N/A 08/19/2013   Procedure: ESOPHAGOGASTRODUODENOSCOPY (EGD) WITH PROPOFOL;  Surgeon: Milus Banister, MD;  [facility];  Service: Endoscopy;  Laterality: N/A;  . ORIF MANDIBULAR FRACTURE N/A 02/16/2016   Procedure: OPEN REDUCTION INTERNAL FIXATION (ORIF) MANDIBULAR FRACTURE;  Surgeon: Melissa Montane, MD;   Location: [place];  Service: ENT;  Laterality: N/A;  . RIB PLATING Left 02/20/2016   Procedure: LEFT RIB PLATING;  Surgeon: Ivin Poot, MD;  Location: [employer];  Service: Thoracic;  Laterality: Left;  . ROBOTIC ASSITED PARTIAL NEPHRECTOMY Left 06/17/2013   Procedure: ROBOTIC ASSITED PARTIAL NEPHRECTOMY;  Surgeon: Dutch Gray, MD;  [facility];  Service: Urology;  Laterality: Left;  . TRACHEOSTOMY TUBE PLACEMENT N/A 02/16/2016   Procedure: TRACHEOSTOMY;  Surgeon: Melissa Montane, MD;  Location: [place];  Service: ENT;  Laterality: N/A;  . VASECTOMY  2012  . WISDOM TOOTH EXTRACTION  middle school  . WRIST SURGERY Left middle school   "arteries and nerves tangled up"    Family History  Problem Relation Age of Onset  . Cirrhosis Father   . Colitis Father   . Heart disease Father   . Asthma Father   . Heart attack Other     Paternal Grandparents  . Stroke Other     Paternal Grandparents  . Prostate cancer Paternal Grandfather   . Diabetes Maternal Grandfather   . Alcohol abuse Mother   . Other Brother     Intestinal Fissure  . Colon polyps Sister     intestinal problems  . Asthma Son   . Other Father     Chemical Imbalance  . Other Brother     Chemical Imbalance    Social History Social History  Substance Use Topics  . Smoking status: Current Every Day Smoker    Packs/day: 1.00    Years: 24.00  Types: Cigarettes  . Smokeless tobacco: Never Used  . Alcohol use 0.0 oz/week     Comment: occasional/social    Current Outpatient Prescriptions  Medication Sig Dispense Refill  . ALPRAZolam (XANAX) 0.5 MG tablet Take 1 tablet (0.5 mg total) by mouth 2 (two) times daily. 20 tablet 0  . cyclobenzaprine (FLEXERIL) 10 MG tablet Take 1 tablet (10 mg total) by mouth 2 (two) times daily as needed for muscle spasms. 20 tablet 0  . gabapentin (NEURONTIN) 600 MG tablet TAKE 1 TABLET BY MOUTH TWICE A DAY 60 tablet 2  . naproxen sodium (ANAPROX) 220 MG tablet Take 220 mg by mouth daily.    Marland Kitchen  tetrahydrozoline 0.05 % ophthalmic solution Place 1 drop into both eyes 2 (two) times daily as needed (dry eyes).     No current facility-administered medications for this visit.     Allergies  Allergen Reactions  . Bee Venom Shortness Of Breath and Swelling    Arm swells  . Hornet Venom Shortness Of Breath and Swelling    Arm swells  . Bee Venom Swelling    Review of Systems  Patient is working as a Chief Strategy Officer No shortness of breath  BP 139/87 (BP Location: Left Arm, Patient Position: Sitting, Cuff Size: Large)   Pulse 92   Resp 16   Ht 6' (1.829 m)   Wt 233 lb (105.7 kg)   SpO2 95% Comment: ON RA  BMI 31.60 kg/m  Physical Exam      Exam    General- alert and comfortable   Lungs- clear without rales, wheezes   Cor- regular rate and rhythm, no murmur , gallop   Abdomen- soft, non-tender   Extremities - warm, non-tender, minimal edema   Neuro- oriented, appropriate, no focal weakness   Diagnostic Tests: CT scan images personally reviewed and counseled with patient  Impression: Healing left rib fractures after plating Prolonged postthoracotomy pain Plan: Referral to pain management clinic Follow-up in 2-3 months No prescriptions provided at this visit  Len Childs, MD [facility] [phone]

## 2017-02-22 ENCOUNTER — Other Ambulatory Visit: Payer: Self-pay | Admitting: Cardiothoracic Surgery

## 2017-03-14 ENCOUNTER — Other Ambulatory Visit: Payer: Self-pay | Admitting: Cardiothoracic Surgery

## 2017-03-14 DIAGNOSIS — G8918 Other acute postprocedural pain: Secondary | ICD-10-CM

## 2017-04-06 ENCOUNTER — Other Ambulatory Visit: Payer: Self-pay | Admitting: Cardiothoracic Surgery

## 2017-04-06 DIAGNOSIS — G8918 Other acute postprocedural pain: Secondary | ICD-10-CM

## 2017-04-14 ENCOUNTER — Ambulatory Visit: Payer: Medicaid Other | Admitting: Physical Medicine & Rehabilitation

## 2017-04-16 ENCOUNTER — Encounter: Payer: Medicaid Other | Admitting: Cardiothoracic Surgery

## 2017-04-27 ENCOUNTER — Other Ambulatory Visit: Payer: Self-pay | Admitting: Cardiothoracic Surgery

## 2017-04-27 DIAGNOSIS — G8918 Other acute postprocedural pain: Secondary | ICD-10-CM

## 2017-05-07 ENCOUNTER — Other Ambulatory Visit: Payer: Self-pay | Admitting: Cardiothoracic Surgery

## 2017-05-14 ENCOUNTER — Ambulatory Visit (INDEPENDENT_AMBULATORY_CARE_PROVIDER_SITE_OTHER): Payer: Medicaid Other | Admitting: Cardiothoracic Surgery

## 2017-05-14 ENCOUNTER — Encounter: Payer: Self-pay | Admitting: Cardiothoracic Surgery

## 2017-05-14 VITALS — BP 138/97 | HR 88 | Resp 16 | Ht 72.0 in | Wt 259.0 lb

## 2017-05-14 DIAGNOSIS — G8912 Acute post-thoracotomy pain: Secondary | ICD-10-CM | POA: Diagnosis not present

## 2017-05-14 DIAGNOSIS — S2242XD Multiple fractures of ribs, left side, subsequent encounter for fracture with routine healing: Secondary | ICD-10-CM | POA: Diagnosis not present

## 2017-05-14 DIAGNOSIS — Z09 Encounter for follow-up examination after completed treatment for conditions other than malignant neoplasm: Secondary | ICD-10-CM | POA: Diagnosis not present
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
## 2017-05-16 ENCOUNTER — Encounter: Payer: Self-pay | Admitting: Physical Medicine & Rehabilitation

## 2017-05-16 ENCOUNTER — Encounter: Payer: Medicaid Other | Attending: Physical Medicine & Rehabilitation

## 2017-05-16 ENCOUNTER — Ambulatory Visit (HOSPITAL_BASED_OUTPATIENT_CLINIC_OR_DEPARTMENT_OTHER): Payer: Medicaid Other | Admitting: Physical Medicine & Rehabilitation

## 2017-05-16 VITALS — BP 130/79 | HR 115

## 2017-05-16 DIAGNOSIS — G8922 Chronic post-thoracotomy pain: Secondary | ICD-10-CM | POA: Diagnosis not present

## 2017-05-16 DIAGNOSIS — Z87820 Personal history of traumatic brain injury: Secondary | ICD-10-CM | POA: Insufficient documentation

## 2017-05-16 DIAGNOSIS — F17210 Nicotine dependence, cigarettes, uncomplicated: Secondary | ICD-10-CM | POA: Insufficient documentation

## 2017-05-16 DIAGNOSIS — M791 Myalgia: Secondary | ICD-10-CM

## 2017-05-16 DIAGNOSIS — M7918 Myalgia, other site: Secondary | ICD-10-CM

## 2017-05-16 DIAGNOSIS — F419 Anxiety disorder, unspecified: Secondary | ICD-10-CM | POA: Insufficient documentation

## 2017-05-16 DIAGNOSIS — F329 Major depressive disorder, single episode, unspecified: Secondary | ICD-10-CM | POA: Diagnosis not present

## 2017-05-16 MED ORDER — GABAPENTIN 600 MG PO TABS: 600.0000 mg | ORAL_TABLET | Freq: Three times a day (TID) | ORAL | 1 refills | Status: DC

## 2017-05-16 NOTE — Patient Instructions (Addendum)
Your upper back pain is mainly muscular. Physical therapy recommended  Thoracotomy pain may respond to intercostal nerve blocks. This can be done under ultrasound guidance to improve accuracy and reduce complications Increasing gabapentin is recommended  I suspect you have a traumatic brain injury and I have recommended neuropsychology for follow-up. I would avoid narcotic medications since this can make the brain injury. symptoms worse, also, it is unlikely that they will improve ability to work and increased danger of work related injuries  Recommend ICY hot to upper back, also heating pad
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
## 2017-05-26 ENCOUNTER — Emergency Department (HOSPITAL_BASED_OUTPATIENT_CLINIC_OR_DEPARTMENT_OTHER)
Admission: EM | Admit: 2017-05-26 | Discharge: 2017-05-26 | Disposition: A | Discharge status: Home / Self Care | Payer: Medicaid Other | Attending: Emergency Medicine | Admitting: Emergency Medicine

## 2017-05-26 ENCOUNTER — Encounter (HOSPITAL_BASED_OUTPATIENT_CLINIC_OR_DEPARTMENT_OTHER): Payer: Self-pay | Admitting: *Deleted

## 2017-05-26 DIAGNOSIS — Y999 Unspecified external cause status: Secondary | ICD-10-CM | POA: Insufficient documentation

## 2017-05-26 DIAGNOSIS — Z85528 Personal history of other malignant neoplasm of kidney: Secondary | ICD-10-CM | POA: Insufficient documentation

## 2017-05-26 DIAGNOSIS — Z79899 Other long term (current) drug therapy: Secondary | ICD-10-CM | POA: Insufficient documentation

## 2017-05-26 DIAGNOSIS — W260XXA Contact with knife, initial encounter: Secondary | ICD-10-CM | POA: Diagnosis not present

## 2017-05-26 DIAGNOSIS — S61211A Laceration without foreign body of left index finger without damage to nail, initial encounter: Secondary | ICD-10-CM | POA: Diagnosis not present

## 2017-05-26 DIAGNOSIS — Y929 Unspecified place or not applicable: Secondary | ICD-10-CM | POA: Diagnosis not present

## 2017-05-26 DIAGNOSIS — Y9389 Activity, other specified: Secondary | ICD-10-CM | POA: Insufficient documentation

## 2017-05-26 DIAGNOSIS — F17210 Nicotine dependence, cigarettes, uncomplicated: Secondary | ICD-10-CM | POA: Diagnosis not present

## 2017-05-26 MED ORDER — LIDOCAINE HCL (PF) 1 % IJ SOLN: 5.0000 mL | Freq: Once | INTRAMUSCULAR | Status: DC

## 2017-05-26 MED ORDER — ACETAMINOPHEN 325 MG PO TABS
650.0000 mg | ORAL_TABLET | Freq: Once | ORAL | Status: AC
  Administered 2017-05-26: 650 mg via ORAL
  Filled 2017-05-26: qty 2

## 2017-05-26 MED ORDER — LIDOCAINE HCL 1 % IJ SOLN
INTRAMUSCULAR | Status: AC
  Filled 2017-05-26: qty 10

## 2017-05-26 MED ORDER — BUPIVACAINE HCL (PF) 0.5 % IJ SOLN
10.0000 mL | Freq: Once | INTRAMUSCULAR | Status: AC
  Administered 2017-05-26: 10 mL
  Filled 2017-05-26: qty 10

## 2017-05-26 NOTE — ED Notes (Addendum)
EDP into room, prior to RN assessment, see MD notes, orders received and initiated.   Alert, NAD, calm, interactive, resps e/u, speaking in clear complete sentences, no dyspnea noted.  Family at [facility].   Digital block in progress.

## 2017-05-26 NOTE — Discharge Instructions (Signed)
Stitches need to be removed in 7-10 days. That can be done here, at an urgent care center, or at your primary care provider's office.  Take ibuprofen, naproxen, or acetaminophen as needed for pain.
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
## 2017-05-26 NOTE — ED Notes (Addendum)
Dressing applied to left index finger. Wound care instructions given
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
## 2017-05-28 ENCOUNTER — Other Ambulatory Visit: Payer: Self-pay | Admitting: Cardiothoracic Surgery

## 2017-05-28 DIAGNOSIS — G8918 Other acute postprocedural pain: Secondary | ICD-10-CM

## 2017-06-03 ENCOUNTER — Ambulatory Visit: Payer: Medicaid Other | Admitting: Physical Therapy

## 2017-06-06 ENCOUNTER — Ambulatory Visit: Payer: Medicaid Other | Admitting: Physical Medicine & Rehabilitation

## 2017-06-13 ENCOUNTER — Other Ambulatory Visit: Payer: Self-pay

## 2017-06-13 DIAGNOSIS — M792 Neuralgia and neuritis, unspecified: Secondary | ICD-10-CM

## 2017-06-13 DIAGNOSIS — G8918 Other acute postprocedural pain: Secondary | ICD-10-CM

## 2017-06-13 MED ORDER — CYCLOBENZAPRINE HCL 10 MG PO TABS: ORAL_TABLET | ORAL | 0 refills | Status: DC

## 2017-06-13 MED ORDER — GABAPENTIN 600 MG PO TABS: 600.0000 mg | ORAL_TABLET | Freq: Three times a day (TID) | ORAL | 1 refills | Status: DC

## 2017-06-13 NOTE — Telephone Encounter (Signed)
RX refills were ok'ed by Dr Prescott Gum for Neurontin 600 mg and Flexeril 10 mg, sent to [facility]

## 2017-06-20 ENCOUNTER — Encounter: Payer: Medicaid Other | Attending: Physical Medicine & Rehabilitation | Admitting: Psychology

## 2017-06-20 DIAGNOSIS — Z87820 Personal history of traumatic brain injury: Secondary | ICD-10-CM | POA: Insufficient documentation

## 2017-06-20 DIAGNOSIS — M791 Myalgia: Secondary | ICD-10-CM | POA: Insufficient documentation

## 2017-06-20 DIAGNOSIS — F17210 Nicotine dependence, cigarettes, uncomplicated: Secondary | ICD-10-CM | POA: Insufficient documentation

## 2017-06-20 DIAGNOSIS — F329 Major depressive disorder, single episode, unspecified: Secondary | ICD-10-CM | POA: Insufficient documentation

## 2017-06-20 DIAGNOSIS — G8922 Chronic post-thoracotomy pain: Secondary | ICD-10-CM | POA: Insufficient documentation

## 2017-06-20 DIAGNOSIS — F419 Anxiety disorder, unspecified: Secondary | ICD-10-CM | POA: Insufficient documentation

## 2017-06-25 ENCOUNTER — Other Ambulatory Visit: Payer: Self-pay | Admitting: Cardiothoracic Surgery

## 2017-06-27 ENCOUNTER — Encounter: Payer: Medicaid Other | Attending: Physical Medicine & Rehabilitation

## 2017-06-27 ENCOUNTER — Ambulatory Visit (HOSPITAL_BASED_OUTPATIENT_CLINIC_OR_DEPARTMENT_OTHER): Payer: Medicaid Other | Admitting: Physical Medicine & Rehabilitation

## 2017-06-27 ENCOUNTER — Encounter: Payer: Self-pay | Admitting: Physical Medicine & Rehabilitation

## 2017-06-27 VITALS — BP 147/95 | HR 109

## 2017-06-27 DIAGNOSIS — Z87820 Personal history of traumatic brain injury: Secondary | ICD-10-CM | POA: Insufficient documentation

## 2017-06-27 DIAGNOSIS — M7918 Myalgia, other site: Secondary | ICD-10-CM

## 2017-06-27 DIAGNOSIS — F17210 Nicotine dependence, cigarettes, uncomplicated: Secondary | ICD-10-CM | POA: Diagnosis not present

## 2017-06-27 DIAGNOSIS — M792 Neuralgia and neuritis, unspecified: Secondary | ICD-10-CM | POA: Diagnosis not present

## 2017-06-27 DIAGNOSIS — G8918 Other acute postprocedural pain: Secondary | ICD-10-CM

## 2017-06-27 DIAGNOSIS — F419 Anxiety disorder, unspecified: Secondary | ICD-10-CM | POA: Diagnosis not present

## 2017-06-27 DIAGNOSIS — F329 Major depressive disorder, single episode, unspecified: Secondary | ICD-10-CM | POA: Diagnosis not present

## 2017-06-27 DIAGNOSIS — IMO0002 Reserved for concepts with insufficient information to code with codable children: Secondary | ICD-10-CM

## 2017-06-27 DIAGNOSIS — G8922 Chronic post-thoracotomy pain: Secondary | ICD-10-CM | POA: Diagnosis present

## 2017-06-27 DIAGNOSIS — M791 Myalgia: Secondary | ICD-10-CM | POA: Diagnosis not present

## 2017-06-27 MED ORDER — GABAPENTIN 600 MG PO TABS: 600.0000 mg | ORAL_TABLET | Freq: Three times a day (TID) | ORAL | 1 refills | Status: DC

## 2017-06-27 MED ORDER — IBUPROFEN 800 MG PO TABS: 800.0000 mg | ORAL_TABLET | Freq: Three times a day (TID) | ORAL | 2 refills | Status: DC | PRN

## 2017-06-27 MED ORDER — CYCLOBENZAPRINE HCL 10 MG PO TABS: ORAL_TABLET | ORAL | 0 refills | Status: DC

## 2017-06-27 NOTE — Progress Notes (Addendum)
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 Pain interferes with activities  Ultrasound guidance using a 12 Hz linear transducer, short axis to the rib and long axis to the needle  Patient placed in a right lateral decubitus position. Painful areas were palpated at the posterior axillary line, marked and prepped with Betadine, the skin was punctured at the superior border of the T8 rib with a 25-gauge 1.5 inch needle inserted at the superior aspect of the rib inferior to the target. The needle tip was directed at the inferior border of the T7 rib above the lung pleura. Lidocaine was infiltrated in the skin and subcutaneous tissues as the needle was advanced. Once the needle was in the final position, 2.5 mL's of a solution containing 1 mL of 6 mg per mL Celestone and 4 mL of 1% lidocaine was injected.  Next the skin was punctured at the superior border of the T9 rib with a 25-gauge 1.5 inch needle inserted at the superior aspect of the rib inferior to the target. The needle tip was directed at the inferior border of the T8 rib above the lung pleura. Lidocaine was infiltrated in the skin and subcutaneous tissues as the needle was advanced. Once the needle was in the final position, 2.5 mL's of a solution containing 1 mL of 6 mg per mL Celestone and 4 mL of 1% lidocaine was injected. Needle depth was approximately 3.0 centimeters  Patient tolerated procedure well, no shortness of breath noted during recovery process, patient ambulated to the bathroom and back without difficulties. Patient noted improvement in pain along the axillary area on the left side

## 2017-07-10 ENCOUNTER — Other Ambulatory Visit: Payer: Self-pay | Admitting: Cardiothoracic Surgery

## 2017-07-17 ENCOUNTER — Other Ambulatory Visit: Payer: Self-pay | Admitting: Cardiothoracic Surgery

## 2017-07-18 ENCOUNTER — Ambulatory Visit: Payer: Medicaid Other | Admitting: Physical Medicine & Rehabilitation

## 2017-07-24 ENCOUNTER — Other Ambulatory Visit: Payer: Self-pay | Admitting: Physical Medicine & Rehabilitation

## 2017-07-24 DIAGNOSIS — G8918 Other acute postprocedural pain: Secondary | ICD-10-CM

## 2017-07-25 ENCOUNTER — Encounter: Payer: Medicaid Other | Attending: Physical Medicine & Rehabilitation

## 2017-07-25 ENCOUNTER — Ambulatory Visit: Payer: Medicaid Other | Admitting: Physical Medicine & Rehabilitation

## 2017-08-12 ENCOUNTER — Other Ambulatory Visit: Payer: Self-pay | Admitting: Cardiothoracic Surgery

## 2017-08-12 DIAGNOSIS — S2220XK Unspecified fracture of sternum, subsequent encounter for fracture with nonunion: Secondary | ICD-10-CM

## 2017-08-13 ENCOUNTER — Other Ambulatory Visit: Payer: Self-pay | Admitting: *Deleted

## 2017-08-13 ENCOUNTER — Ambulatory Visit (INDEPENDENT_AMBULATORY_CARE_PROVIDER_SITE_OTHER): Payer: Medicaid Other | Admitting: Cardiothoracic Surgery

## 2017-08-13 ENCOUNTER — Encounter: Payer: Self-pay | Admitting: Cardiothoracic Surgery

## 2017-08-13 ENCOUNTER — Ambulatory Visit
Admission: RE | Admit: 2017-08-13 | Discharge: 2017-08-13 | Disposition: A | Discharge status: Home / Self Care | Payer: Self-pay | Source: Ambulatory Visit | Attending: Cardiothoracic Surgery | Admitting: Cardiothoracic Surgery

## 2017-08-13 VITALS — BP 141/92 | HR 90 | Resp 16 | Ht 72.0 in | Wt 250.0 lb

## 2017-08-13 DIAGNOSIS — Z09 Encounter for follow-up examination after completed treatment for conditions other than malignant neoplasm: Secondary | ICD-10-CM

## 2017-08-13 DIAGNOSIS — G8912 Acute post-thoracotomy pain: Secondary | ICD-10-CM | POA: Diagnosis not present

## 2017-08-13 DIAGNOSIS — S2242XD Multiple fractures of ribs, left side, subsequent encounter for fracture with routine healing: Secondary | ICD-10-CM | POA: Diagnosis not present

## 2017-08-13 DIAGNOSIS — S2220XK Unspecified fracture of sternum, subsequent encounter for fracture with nonunion: Secondary | ICD-10-CM

## 2017-08-13 MED ORDER — GABAPENTIN 300 MG PO CAPS: 600.0000 mg | ORAL_CAPSULE | Freq: Three times a day (TID) | ORAL | 1 refills | Status: DC

## 2017-08-13 NOTE — Progress Notes (Signed)
PCP is Delorse Limber Referring Provider is Judeth Horn, MD  Chief Complaint  Patient presents with  . Routine Post Op    3 month f/u with a CXR    HPI: Final visit almost 18 months after left thoracotomy and rib plating of multiple rib fractures.  Patient is still having issues with pain and postthoracotomy pain.  He is being treated at a pain clinic by Dr. Read Drivers.  He is able to work full-time.  He appears to be slowly getting better.  Chest x-ray taken today shows healing of the previously plated rib fractures.   Past Medical History:  Diagnosis Date  . Anxiety and depression 08/01/2013  . Cancer ([place])   . Cellulitis    left leg and stomach  . Chicken pox   . Diarrhea 08/01/2013  . History of kidney cancer   . Hx of vasectomy   . Renal cell carcinoma ([place]) 06/01/2013    Past Surgical History:  Procedure Laterality Date  . COLONOSCOPY WITH PROPOFOL N/A 08/19/2013   Procedure: COLONOSCOPY WITH PROPOFOL;  Surgeon: Milus Banister, MD;  [facility];  Service: Endoscopy;  Laterality: N/A;  . ESOPHAGOGASTRODUODENOSCOPY (EGD) WITH PROPOFOL N/A 08/19/2013   Procedure: ESOPHAGOGASTRODUODENOSCOPY (EGD) WITH PROPOFOL;  Surgeon: Milus Banister, MD;  [facility];  Service: Endoscopy;  Laterality: N/A;  . ORIF MANDIBULAR FRACTURE N/A 02/16/2016   Procedure: OPEN REDUCTION INTERNAL FIXATION (ORIF) MANDIBULAR FRACTURE;  Surgeon: Melissa Montane, MD;  Location: [facility];  Service: ENT;  Laterality: N/A;  . RIB PLATING Left 02/20/2016   Procedure: LEFT RIB PLATING;  Surgeon: Ivin Poot, MD;  Location: [facility];  Service: Thoracic;  Laterality: Left;  . ROBOTIC ASSITED PARTIAL NEPHRECTOMY Left 06/17/2013   Procedure: ROBOTIC ASSITED PARTIAL NEPHRECTOMY;  Surgeon: Dutch Gray, MD;  [facility];  Service: Urology;  Laterality: Left;  . TRACHEOSTOMY TUBE PLACEMENT N/A 02/16/2016   Procedure: TRACHEOSTOMY;  Surgeon: Melissa Montane, MD;  Location: [facility];  Service: ENT;   Laterality: N/A;  . VASECTOMY  2012  . WISDOM TOOTH EXTRACTION  middle school  . WRIST SURGERY Left middle school   "arteries and nerves tangled up"    Family History  Problem Relation Age of Onset  . Cirrhosis Father   . Colitis Father   . Heart disease Father   . Asthma Father   . Other Father        Chemical Imbalance  . Heart attack Other        Paternal Grandparents  . Stroke Other        Paternal Grandparents  . Prostate cancer Paternal Grandfather   . Diabetes Maternal Grandfather   . Alcohol abuse Mother   . Other Brother        Intestinal Fissure  . Colon polyps Sister        intestinal problems  . Asthma Son   . Other Brother        Chemical Imbalance    Social History Social History  Substance Use Topics  . Smoking status: Current Every Day Smoker    Packs/day: 1.00    Years: 24.00    Types: Cigarettes  . Smokeless tobacco: Never Used  . Alcohol use 0.0 oz/week     Comment: occasional/social    Current Outpatient Prescriptions  Medication Sig Dispense Refill  . Acetaminophen (TYLENOL ARTHRITIS EXT RELIEF PO) Take 1-2 tablets by mouth every 8 (eight) hours as needed.    . cyclobenzaprine (FLEXERIL) 10  MG tablet TAKE 1 TABLET BY MOUTH TWICE A DAY AS NEEDED FOR BACK PAIN 40 tablet 0  . gabapentin (NEURONTIN) 300 MG capsule Take 2 capsules (600 mg total) by mouth 3 (three) times daily. 180 capsule 1  . ibuprofen (ADVIL,MOTRIN) 800 MG tablet Take 1 tablet (800 mg total) by mouth every 8 (eight) hours as needed. 40 tablet 2  . tetrahydrozoline 0.05 % ophthalmic solution Place 1 drop into both eyes 2 (two) times daily as needed (dry eyes).     No current facility-administered medications for this visit.     Allergies  Allergen Reactions  . Bee Venom Shortness Of Breath and Swelling    Arm swells  . Hornet Venom Shortness Of Breath and Swelling    Arm swells  . Bee Venom Swelling    Review of Systems   He has some soreness in his left mandible where  he had a art where placed for a mandibular fracture at the same time he sustained rib fractures  BP (!) 141/92 (BP Location: Left Arm, Patient Position: Sitting, Cuff Size: Large)   Pulse 90   Resp 16   Ht 6' (1.829 m)   Wt 250 lb (113.4 kg)   SpO2 97% Comment: ON RA  BMI 33.91 kg/m  Physical Exam      Exam    General- alert and comfortable   Lungs- clear without rales, wheezes   Cor- regular rate and rhythm, no murmur , gallop   Abdomen- soft, non-tender   Extremities - warm, non-tender, minimal edema   Neuro- oriented, appropriate, no focal weakness   Diagnostic Tests: Chest x-ray is clear, rib fractures have healed  Impression: 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  A chest x-ray shows no surgical issues  Plan: I gave the patient a prescription for topical Voltaren gel.  He will applied twice daily to the painful areas.  Otherwise he was encouraged to follow the recommendations of his physician at the pain clinic.  There is nothing more I can offer this patient.   Len Childs, MD [facility] [phone]

## 2017-08-13 NOTE — Progress Notes (Signed)
Y 

## 2017-08-14 ENCOUNTER — Other Ambulatory Visit: Payer: Self-pay | Admitting: Physical Medicine & Rehabilitation

## 2017-08-14 DIAGNOSIS — G8918 Other acute postprocedural pain: Secondary | ICD-10-CM

## 2017-09-14 ENCOUNTER — Emergency Department (HOSPITAL_BASED_OUTPATIENT_CLINIC_OR_DEPARTMENT_OTHER): Payer: Medicaid Other

## 2017-09-14 ENCOUNTER — Other Ambulatory Visit: Payer: Self-pay

## 2017-09-14 ENCOUNTER — Emergency Department (HOSPITAL_BASED_OUTPATIENT_CLINIC_OR_DEPARTMENT_OTHER)
Admission: EM | Admit: 2017-09-14 | Discharge: 2017-09-14 | Disposition: A | Discharge status: Home / Self Care | Payer: Medicaid Other | Attending: Emergency Medicine | Admitting: Emergency Medicine

## 2017-09-14 ENCOUNTER — Encounter (HOSPITAL_BASED_OUTPATIENT_CLINIC_OR_DEPARTMENT_OTHER): Payer: Self-pay | Admitting: *Deleted

## 2017-09-14 DIAGNOSIS — F17210 Nicotine dependence, cigarettes, uncomplicated: Secondary | ICD-10-CM | POA: Diagnosis not present

## 2017-09-14 DIAGNOSIS — R0789 Other chest pain: Secondary | ICD-10-CM | POA: Diagnosis present

## 2017-09-14 DIAGNOSIS — M25522 Pain in left elbow: Secondary | ICD-10-CM | POA: Diagnosis not present

## 2017-09-14 DIAGNOSIS — Z85528 Personal history of other malignant neoplasm of kidney: Secondary | ICD-10-CM | POA: Diagnosis not present

## 2017-09-14 DIAGNOSIS — W19XXXA Unspecified fall, initial encounter: Secondary | ICD-10-CM

## 2017-09-14 MED ORDER — IBUPROFEN 800 MG PO TABS
800.0000 mg | ORAL_TABLET | Freq: Once | ORAL | Status: AC
  Administered 2017-09-14: 800 mg via ORAL

## 2017-09-14 MED ORDER — OXYCODONE-ACETAMINOPHEN 5-325 MG PO TABS
1.0000 | ORAL_TABLET | Freq: Once | ORAL | Status: AC
  Administered 2017-09-14: 1 via ORAL
  Filled 2017-09-14: qty 1

## 2017-09-14 MED ORDER — HYDROCODONE-ACETAMINOPHEN 5-325 MG PO TABS: 1.0000 | ORAL_TABLET | ORAL | 0 refills | Status: DC | PRN

## 2017-09-14 MED ORDER — IBUPROFEN 800 MG PO TABS: 800.0000 mg | ORAL_TABLET | Freq: Three times a day (TID) | ORAL | 0 refills | Status: DC | PRN

## 2017-09-14 MED ORDER — IBUPROFEN 800 MG PO TABS
ORAL_TABLET | ORAL | Status: AC
  Administered 2017-09-14: 800 mg via ORAL
  Filled 2017-09-14: qty 1
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
## 2017-09-14 NOTE — ED Notes (Signed)
Ice pack given

## 2017-09-14 NOTE — Discharge Instructions (Signed)

## 2017-09-14 NOTE — ED Triage Notes (Signed)
Pt reports slipped on wet steps and fell down 4 steps. C/o left arm and left rib pain. Denies LOC

## 2017-10-28 ENCOUNTER — Telehealth: Payer: Self-pay

## 2017-10-28 NOTE — Telephone Encounter (Signed)
[name] called this afternoon concerned about pain in his side, "kind of like muscle spasms".  Wanting advice as to if he should have this looked at by a PCP.  I advised him that if his pain in getting worse and this medical issue is not getting better, he should go to his PCP to find the cause.  Patient acknowledged receipt.

## 2017-11-16 ENCOUNTER — Other Ambulatory Visit: Payer: Self-pay | Admitting: Physical Medicine & Rehabilitation

## 2018-03-18 ENCOUNTER — Other Ambulatory Visit: Payer: Self-pay | Admitting: Physical Medicine & Rehabilitation

## 2018-04-09 ENCOUNTER — Other Ambulatory Visit: Payer: Self-pay

## 2018-04-09 ENCOUNTER — Encounter: Payer: Self-pay | Admitting: Physician Assistant

## 2018-04-09 ENCOUNTER — Ambulatory Visit: Payer: Self-pay | Admitting: Physician Assistant

## 2018-04-09 VITALS — BP 120/86 | HR 81 | Temp 98.1°F | Resp 16 | Ht 73.0 in | Wt 265.0 lb

## 2018-04-09 DIAGNOSIS — R5382 Chronic fatigue, unspecified: Secondary | ICD-10-CM

## 2018-04-09 DIAGNOSIS — G8929 Other chronic pain: Secondary | ICD-10-CM

## 2018-04-09 DIAGNOSIS — R52 Pain, unspecified: Secondary | ICD-10-CM

## 2018-04-09 LAB — CBC WITH DIFFERENTIAL/PLATELET
BASOS ABS: 0.1 10*3/uL (ref 0.0–0.1)
Basophils Relative: 0.8 % (ref 0.0–3.0)
EOS ABS: 0.2 10*3/uL (ref 0.0–0.7)
EOS PCT: 2.8 % (ref 0.0–5.0)
HCT: 43.8 % (ref 39.0–52.0)
Hemoglobin: 15.1 g/dL (ref 13.0–17.0)
Lymphocytes Relative: 15.5 % (ref 12.0–46.0)
Lymphs Abs: 1.2 10*3/uL (ref 0.7–4.0)
MCHC: 34.4 g/dL (ref 30.0–36.0)
MCV: 97.1 fl (ref 78.0–100.0)
MONO ABS: 0.6 10*3/uL (ref 0.1–1.0)
Monocytes Relative: 7.6 % (ref 3.0–12.0)
NEUTROS PCT: 73.3 % (ref 43.0–77.0)
Neutro Abs: 5.7 10*3/uL (ref 1.4–7.7)
Platelets: 207 10*3/uL (ref 150.0–400.0)
RBC: 4.51 Mil/uL (ref 4.22–5.81)
RDW: 13.2 % (ref 11.5–15.5)
WBC: 7.8 10*3/uL (ref 4.0–10.5)

## 2018-04-09 LAB — VITAMIN B12: VITAMIN B 12: 399 pg/mL (ref 211–911)

## 2018-04-09 LAB — TSH: TSH: 0.81 u[IU]/mL (ref 0.35–4.50)

## 2018-04-09 LAB — VITAMIN D 25 HYDROXY (VIT D DEFICIENCY, FRACTURES): VITD: 42.14 ng/mL (ref 30.00–100.00)
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
## 2018-04-09 NOTE — Patient Instructions (Signed)
Please go to the lab today for blood work.  I will call you with your results. We will alter treatment regimen(s) if indicated by your results.   Please download the [employer] App on your phone and try the activities recommended.

## 2018-04-10 ENCOUNTER — Other Ambulatory Visit: Payer: Self-pay | Admitting: Physician Assistant

## 2018-04-10 DIAGNOSIS — F419 Anxiety disorder, unspecified: Secondary | ICD-10-CM

## 2018-04-10 DIAGNOSIS — G894 Chronic pain syndrome: Secondary | ICD-10-CM

## 2018-04-10 DIAGNOSIS — N529 Male erectile dysfunction, unspecified: Secondary | ICD-10-CM

## 2018-04-10 DIAGNOSIS — R5382 Chronic fatigue, unspecified: Secondary | ICD-10-CM

## 2018-04-10 DIAGNOSIS — F329 Major depressive disorder, single episode, unspecified: Secondary | ICD-10-CM

## 2018-04-10 DIAGNOSIS — F32A Depression, unspecified: Secondary | ICD-10-CM

## 2018-04-10 MED ORDER — CYCLOBENZAPRINE HCL 10 MG PO TABS: 10.0000 mg | ORAL_TABLET | Freq: Three times a day (TID) | ORAL | 0 refills | Status: DC | PRN

## 2018-04-10 MED ORDER — ALPRAZOLAM 0.5 MG PO TABS: 0.5000 mg | ORAL_TABLET | Freq: Two times a day (BID) | ORAL | 0 refills | Status: DC | PRN

## 2018-04-10 MED ORDER — DULOXETINE HCL 20 MG PO CPEP: 20.0000 mg | ORAL_CAPSULE | Freq: Every day | ORAL | 1 refills | Status: DC

## 2018-05-05 ENCOUNTER — Other Ambulatory Visit: Payer: Self-pay | Admitting: Physician Assistant

## 2018-05-05 DIAGNOSIS — F329 Major depressive disorder, single episode, unspecified: Secondary | ICD-10-CM

## 2018-05-05 DIAGNOSIS — G894 Chronic pain syndrome: Secondary | ICD-10-CM

## 2018-05-05 DIAGNOSIS — F419 Anxiety disorder, unspecified: Principal | ICD-10-CM

## 2018-05-09 ENCOUNTER — Other Ambulatory Visit: Payer: Self-pay | Admitting: Physician Assistant

## 2018-05-13 ENCOUNTER — Other Ambulatory Visit: Payer: Self-pay | Admitting: Physician Assistant

## 2018-05-14 NOTE — Telephone Encounter (Signed)
Last OV 04/09/18, No future OV  Last filled 04/10/18, # 10 with 0 refills

## 2018-05-31 ENCOUNTER — Other Ambulatory Visit: Payer: Self-pay | Admitting: Physical Medicine & Rehabilitation

## 2018-06-04 ENCOUNTER — Other Ambulatory Visit: Payer: Self-pay | Admitting: Physical Medicine & Rehabilitation

## 2018-06-05 ENCOUNTER — Other Ambulatory Visit: Payer: Self-pay | Admitting: Emergency Medicine

## 2018-06-05 DIAGNOSIS — G894 Chronic pain syndrome: Secondary | ICD-10-CM

## 2018-06-05 DIAGNOSIS — F329 Major depressive disorder, single episode, unspecified: Secondary | ICD-10-CM

## 2018-06-05 DIAGNOSIS — F419 Anxiety disorder, unspecified: Principal | ICD-10-CM

## 2018-06-05 MED ORDER — ALPRAZOLAM 0.5 MG PO TABS: ORAL_TABLET | ORAL | 0 refills | Status: DC

## 2018-06-05 NOTE — Telephone Encounter (Signed)
He is prescribed 2 capsules 3x/day.  Is it the 2 capsules that aren't working so he increased to 3 capsules TID?  Or are the 3 capsules not working?  He has enough Cymbalta and Flexeril until [place] returns

## 2018-06-05 NOTE — Telephone Encounter (Signed)
Patient states the Gabapentin is not working as well for his pain control. He is taking 3 capsule tid to dull the pain as well with Flexeril and Cymbalta.   Xanax last filled 05/14/18 #10  Please advise in PCP absence  Copied from [facility] #[id number]. Topic: General - Other >> Jun 05, 2018 12:53 PM Oneta Rack wrote: Relation to pt: self Call back [phone]   Reason for call:  Pharmacy advised patient medication request for gabapentin (NEURONTIN) 300 MG capsule, cyclobenzaprine (FLEXERIL) 10 MG tablet and ALPRAZolam (XANAX) 0.5 MG tablet was denied. Informed patient please allow 48 to 72 hour turn around time, please advise

## 2018-06-05 NOTE — Telephone Encounter (Signed)
He states he is taking 3 capsules tid to dull the pain. Yes he increased to 3 capsules tid

## 2018-06-08 ENCOUNTER — Other Ambulatory Visit: Payer: Self-pay | Admitting: Physician Assistant

## 2018-06-09 MED ORDER — GABAPENTIN 300 MG PO CAPS: ORAL_CAPSULE | ORAL | 1 refills | Status: DC

## 2018-06-09 NOTE — Addendum Note (Signed)
Addended by: Leonidas Romberg on: 06/09/2018 01:18 PM   Modules accepted: Orders

## 2018-06-09 NOTE — Telephone Encounter (Signed)
Last OV 04/09/18, No future OV  Last filled 03/19/18, by Dr. Alysia Penna, MD, # [phone] with 1 refill

## 2018-06-09 NOTE — Telephone Encounter (Signed)
It is an acceptable dosing regimen but I would not go any higher. Can consider changing dose of Cymbalta to further help with chronic pain. Recommend he schedule an appointment to discuss.

## 2018-06-09 NOTE — Telephone Encounter (Signed)
Spoke with patient he is not currently seeing Dr Letta Pate  He states his Gabapentin 300 mg is prescribed 2 capsules tid He has been taking 3 capsules AM, 2 capsules lunch, 3 capsules at bedtime While it does help a little with pain control he is still having pain and bloating in the rib area. Is this amount safe to continue or other pain control options? Please advise

## 2018-06-15 ENCOUNTER — Emergency Department (HOSPITAL_BASED_OUTPATIENT_CLINIC_OR_DEPARTMENT_OTHER)
Admission: EM | Admit: 2018-06-15 | Discharge: 2018-06-15 | Disposition: A | Discharge status: Home / Self Care | Payer: Medicaid Other | Attending: Emergency Medicine | Admitting: Emergency Medicine

## 2018-06-15 ENCOUNTER — Encounter (HOSPITAL_BASED_OUTPATIENT_CLINIC_OR_DEPARTMENT_OTHER): Payer: Self-pay | Admitting: *Deleted

## 2018-06-15 ENCOUNTER — Other Ambulatory Visit: Payer: Self-pay

## 2018-06-15 DIAGNOSIS — F17210 Nicotine dependence, cigarettes, uncomplicated: Secondary | ICD-10-CM | POA: Insufficient documentation

## 2018-06-15 DIAGNOSIS — M79622 Pain in left upper arm: Secondary | ICD-10-CM | POA: Insufficient documentation

## 2018-06-15 DIAGNOSIS — Z85528 Personal history of other malignant neoplasm of kidney: Secondary | ICD-10-CM | POA: Insufficient documentation

## 2018-06-15 DIAGNOSIS — T7840XA Allergy, unspecified, initial encounter: Secondary | ICD-10-CM

## 2018-06-15 DIAGNOSIS — Z79899 Other long term (current) drug therapy: Secondary | ICD-10-CM | POA: Insufficient documentation

## 2018-06-15 DIAGNOSIS — T63421A Toxic effect of venom of ants, accidental (unintentional), initial encounter: Secondary | ICD-10-CM | POA: Insufficient documentation

## 2018-06-15 DIAGNOSIS — S40862A Insect bite (nonvenomous) of left upper arm, initial encounter: Secondary | ICD-10-CM

## 2018-06-15 DIAGNOSIS — W57XXXA Bitten or stung by nonvenomous insect and other nonvenomous arthropods, initial encounter: Secondary | ICD-10-CM

## 2018-06-15 MED ORDER — FAMOTIDINE 20 MG PO TABS: 20.0000 mg | ORAL_TABLET | Freq: Two times a day (BID) | ORAL | 0 refills | Status: DC

## 2018-06-15 MED ORDER — DIPHENHYDRAMINE HCL 25 MG PO CAPS
25.0000 mg | ORAL_CAPSULE | Freq: Once | ORAL | Status: AC
  Administered 2018-06-15: 25 mg via ORAL
  Filled 2018-06-15: qty 1

## 2018-06-15 MED ORDER — FAMOTIDINE 20 MG PO TABS
20.0000 mg | ORAL_TABLET | Freq: Once | ORAL | Status: AC
  Administered 2018-06-15: 20 mg via ORAL
  Filled 2018-06-15: qty 1

## 2018-06-15 MED ORDER — EPINEPHRINE 0.3 MG/0.3ML IJ SOAJ: 0.3000 mg | Freq: Once | INTRAMUSCULAR | 1 refills | Status: AC

## 2018-06-15 MED ORDER — PREDNISONE 10 MG PO TABS
60.0000 mg | ORAL_TABLET | Freq: Once | ORAL | Status: AC
  Administered 2018-06-15: 60 mg via ORAL
  Filled 2018-06-15: qty 1

## 2018-06-15 MED ORDER — PREDNISONE 10 MG PO TABS: 40.0000 mg | ORAL_TABLET | Freq: Every day | ORAL | 0 refills | Status: DC

## 2018-06-15 MED ORDER — HYDROCODONE-ACETAMINOPHEN 5-325 MG PO TABS
1.0000 | ORAL_TABLET | Freq: Once | ORAL | Status: AC
  Administered 2018-06-15: 1 via ORAL
  Filled 2018-06-15: qty 1

## 2018-06-15 MED ORDER — HYDROCODONE-ACETAMINOPHEN 5-325 MG PO TABS: 1.0000 | ORAL_TABLET | Freq: Four times a day (QID) | ORAL | 0 refills | Status: DC | PRN

## 2018-06-15 MED FILL — HYDROCODON-APAP 5-325: 5-325 | 2 days supply | Qty: 10 | Fill #0

## 2018-06-15 MED FILL — EPINEPHRINE 0.3 MG AUTO-INJ: 0.3 | 2 days supply | Qty: 2 | Fill #0

## 2018-06-15 MED FILL — predniSONE 10 MG TABS: 10 | 5 days supply | Qty: 20 | Fill #0

## 2018-06-15 MED FILL — FAMOTIDINE 20 MG TABLET: 20 | 15 days supply | Qty: 30 | Fill #0

## 2018-06-15 NOTE — ED Triage Notes (Signed)
Bit by fire ants last night. Multiple bites to his left axilla. States he is burning and feels dizzy. He took a Benadryl 25mg  this am. Speaking in complete sentences.
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
## 2018-06-15 NOTE — Discharge Instructions (Signed)
Take the prednisone as directed for the next 5 days.  Take the Benadryl for the next 24 hours.  That would be 1 tablet every 6 hours.  Take the Pepcid for the next 7 days.  In addition a separate prescription was provided for EpiPen.  Return for new or worse symptoms.  Take the hydrocodone as needed for the pain.

## 2018-07-03 ENCOUNTER — Other Ambulatory Visit: Payer: Self-pay | Admitting: Internal Medicine

## 2018-07-03 DIAGNOSIS — G894 Chronic pain syndrome: Secondary | ICD-10-CM

## 2018-07-20 ENCOUNTER — Other Ambulatory Visit: Payer: Self-pay | Admitting: Physician Assistant

## 2018-07-20 ENCOUNTER — Other Ambulatory Visit: Payer: Self-pay | Admitting: Family Medicine

## 2018-07-20 DIAGNOSIS — F419 Anxiety disorder, unspecified: Principal | ICD-10-CM

## 2018-07-20 DIAGNOSIS — G894 Chronic pain syndrome: Secondary | ICD-10-CM

## 2018-07-20 DIAGNOSIS — F329 Major depressive disorder, single episode, unspecified: Secondary | ICD-10-CM

## 2018-07-21 NOTE — Telephone Encounter (Signed)
Last OV 04/09/18 Alprazolam last filled 06/05/18 #10 with 0

## 2018-07-22 ENCOUNTER — Other Ambulatory Visit: Payer: Self-pay | Admitting: Family Medicine

## 2018-07-22 NOTE — Telephone Encounter (Signed)
Last OV 04/09/18 Alprazolam last filled 06/05/18 #10 with 0

## 2018-08-13 ENCOUNTER — Other Ambulatory Visit: Payer: Self-pay | Admitting: Physician Assistant

## 2018-08-13 DIAGNOSIS — G894 Chronic pain syndrome: Secondary | ICD-10-CM

## 2018-08-13 NOTE — Telephone Encounter (Signed)
Ok to send in a refill for a 90 day supply? This would be 720 capsules.

## 2018-08-19 ENCOUNTER — Encounter: Payer: Self-pay | Admitting: Gastroenterology

## 2018-08-21 ENCOUNTER — Other Ambulatory Visit: Payer: Self-pay | Admitting: Family Medicine

## 2018-08-21 NOTE — Telephone Encounter (Signed)
Last OV 04/09/18 Alprazolam last filled 06/05/18 #10 with 0

## 2018-09-08 ENCOUNTER — Emergency Department (HOSPITAL_BASED_OUTPATIENT_CLINIC_OR_DEPARTMENT_OTHER): Payer: Self-pay

## 2018-09-08 ENCOUNTER — Other Ambulatory Visit: Payer: Self-pay

## 2018-09-08 ENCOUNTER — Encounter (HOSPITAL_BASED_OUTPATIENT_CLINIC_OR_DEPARTMENT_OTHER): Payer: Self-pay | Admitting: Emergency Medicine

## 2018-09-08 ENCOUNTER — Emergency Department (HOSPITAL_BASED_OUTPATIENT_CLINIC_OR_DEPARTMENT_OTHER)
Admission: EM | Admit: 2018-09-08 | Discharge: 2018-09-08 | Disposition: A | Discharge status: Home / Self Care | Payer: Self-pay | Attending: Emergency Medicine | Admitting: Emergency Medicine

## 2018-09-08 DIAGNOSIS — F17210 Nicotine dependence, cigarettes, uncomplicated: Secondary | ICD-10-CM | POA: Insufficient documentation

## 2018-09-08 DIAGNOSIS — Z85528 Personal history of other malignant neoplasm of kidney: Secondary | ICD-10-CM | POA: Insufficient documentation

## 2018-09-08 DIAGNOSIS — J209 Acute bronchitis, unspecified: Secondary | ICD-10-CM | POA: Insufficient documentation

## 2018-09-08 MED ORDER — ALBUTEROL SULFATE HFA 108 (90 BASE) MCG/ACT IN AERS
2.0000 | INHALATION_SPRAY | Freq: Once | RESPIRATORY_TRACT | Status: AC
  Administered 2018-09-08: 2 via RESPIRATORY_TRACT
  Filled 2018-09-08: qty 6.7

## 2018-09-08 MED ORDER — ALBUTEROL SULFATE HFA 108 (90 BASE) MCG/ACT IN AERS: 1.0000 | INHALATION_SPRAY | Freq: Four times a day (QID) | RESPIRATORY_TRACT | 0 refills | Status: DC | PRN

## 2018-09-08 MED ORDER — NICOTINE 21 MG/24HR TD PT24: 21.0000 mg | MEDICATED_PATCH | Freq: Every day | TRANSDERMAL | 0 refills | Status: DC

## 2018-09-08 MED ORDER — PREDNISONE 20 MG PO TABS: 40.0000 mg | ORAL_TABLET | Freq: Every day | ORAL | 0 refills | Status: AC

## 2018-09-08 MED ORDER — AZITHROMYCIN 250 MG PO TABS: 250.0000 mg | ORAL_TABLET | Freq: Every day | ORAL | 0 refills | Status: DC

## 2018-09-08 MED ORDER — ALBUTEROL SULFATE (2.5 MG/3ML) 0.083% IN NEBU
5.0000 mg | INHALATION_SOLUTION | Freq: Once | RESPIRATORY_TRACT | Status: AC
  Administered 2018-09-08: 5 mg via RESPIRATORY_TRACT
  Filled 2018-09-08: qty 6

## 2018-09-08 MED ORDER — PREDNISONE 50 MG PO TABS
60.0000 mg | ORAL_TABLET | Freq: Once | ORAL | Status: AC
  Administered 2018-09-08: 60 mg via ORAL
  Filled 2018-09-08: qty 1

## 2018-09-08 NOTE — ED Triage Notes (Signed)
Pt states he has been fighting a cold for the past few weeks. SOB has been worsening today and also reports chest pain.

## 2018-09-08 NOTE — ED Notes (Signed)
Patient transported to X-ray 

## 2018-09-08 NOTE — Discharge Instructions (Signed)
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
## 2018-09-21 ENCOUNTER — Other Ambulatory Visit: Payer: Self-pay | Admitting: Internal Medicine

## 2018-09-21 ENCOUNTER — Other Ambulatory Visit: Payer: Self-pay | Admitting: Physician Assistant

## 2018-09-21 DIAGNOSIS — G894 Chronic pain syndrome: Secondary | ICD-10-CM

## 2018-10-19 ENCOUNTER — Ambulatory Visit: Payer: Self-pay | Admitting: Physician Assistant

## 2018-10-19 ENCOUNTER — Ambulatory Visit: Payer: Self-pay | Admitting: *Deleted

## 2018-10-19 ENCOUNTER — Encounter: Payer: Self-pay | Admitting: Physician Assistant

## 2018-10-19 ENCOUNTER — Other Ambulatory Visit: Payer: Self-pay

## 2018-10-19 VITALS — BP 144/94 | HR 98 | Temp 98.3°F | Resp 18 | Ht 73.0 in | Wt 269.0 lb

## 2018-10-19 DIAGNOSIS — J209 Acute bronchitis, unspecified: Secondary | ICD-10-CM

## 2018-10-19 DIAGNOSIS — G894 Chronic pain syndrome: Secondary | ICD-10-CM

## 2018-10-19 MED ORDER — ALPRAZOLAM 0.5 MG PO TABS: ORAL_TABLET | ORAL | 0 refills | Status: DC

## 2018-10-19 MED ORDER — BENZONATATE 100 MG PO CAPS: 100.0000 mg | ORAL_CAPSULE | Freq: Three times a day (TID) | ORAL | 0 refills | Status: DC | PRN

## 2018-10-19 MED ORDER — DOXYCYCLINE HYCLATE 100 MG PO CAPS: 100.0000 mg | ORAL_CAPSULE | Freq: Two times a day (BID) | ORAL | 0 refills | Status: DC

## 2018-10-19 MED ORDER — METHYLPREDNISOLONE ACETATE 40 MG/ML IJ SUSP: 40.0000 mg | Freq: Once | INTRAMUSCULAR | Status: DC

## 2018-10-19 MED ORDER — CARISOPRODOL 350 MG PO TABS: 350.0000 mg | ORAL_TABLET | Freq: Three times a day (TID) | ORAL | 0 refills | Status: DC

## 2018-10-19 MED ORDER — METHYLPREDNISOLONE ACETATE 80 MG/ML IJ SUSP
40.0000 mg | Freq: Once | INTRAMUSCULAR | Status: AC
  Administered 2018-10-19: 40 mg via INTRAMUSCULAR

## 2018-10-19 MED FILL — DOXYCYCLINE HYCLATE 100 MG: 100 | 10 days supply | Qty: 20 | Fill #0

## 2018-10-19 MED FILL — CARISOPRODOL 350 MG TABS: 350 | 30 days supply | Qty: 90 | Fill #0

## 2018-10-19 MED FILL — ALPRAZolam 0.5 MG TABS: 0.5 | 15 days supply | Qty: 30 | Fill #0

## 2018-10-19 MED FILL — BENZONATATE 100 MG CAP: 100 | 10 days supply | Qty: 30 | Fill #0

## 2018-10-19 NOTE — Progress Notes (Signed)
Patient presents to clinic today c/o chest congestion, cough that is productive of cough, temperature with Tmax at 102. Endorses some sinus pressure and headache. Endorses some left-sided sinus pain. Was recently seen for bronchitis a couple of weeks ago. Notes treatment at that time with amox, but never felt completely better. Denies fever in the past couple of days. Still noting worsening chest congestion. Feels tired.   Past Medical History:  Diagnosis Date  . Anxiety and depression 08/01/2013  . Cancer ([place])   . Cellulitis    left leg and stomach  . Chicken pox   . Diarrhea 08/01/2013  . History of kidney cancer   . Hx of vasectomy   . Renal cell carcinoma ([place]) 06/01/2013    Current Outpatient Medications on File Prior to Visit  Medication Sig Dispense Refill  . Acetaminophen (TYLENOL ARTHRITIS EXT RELIEF PO) Take 1-2 tablets by mouth every 8 (eight) hours as needed.    Marland Kitchen albuterol (PROVENTIL HFA;VENTOLIN HFA) 108 (90 Base) MCG/ACT inhaler Inhale 1-2 puffs into the lungs every 6 (six) hours as needed for wheezing or shortness of breath. 1 Inhaler 0  . ALPRAZolam (XANAX) 0.5 MG tablet TAKE 1 TABLET BY MOUTH 2 TIMES DAILY AS NEEDED FOR ANXIETY. 10 tablet 0  . Ascorbic Acid (VITAMIN C) 1000 MG tablet Take 1,000 mg by mouth daily.    . Biotin 10 MG TABS Take 1 tablet by mouth daily.    . famotidine (PEPCID) 20 MG tablet Take 1 tablet (20 mg total) by mouth 2 (two) times daily. 30 tablet 0  . gabapentin (NEURONTIN) 300 MG capsule Take 3 capsules in morning, 2 capsules in afternoon, 3 capsules at bedtime. 240 capsule 1  . ibuprofen (ADVIL,MOTRIN) 800 MG tablet Take 1 tablet (800 mg total) by mouth every 8 (eight) hours as needed. 21 tablet 0  . Multiple Vitamins-Minerals (MULTIVITAMIN ADULTS 50+ PO) Take 1 tablet by mouth daily.    . nicotine (NICODERM CQ - DOSED IN MG/24 HOURS) 21 mg/24hr patch Place 1 patch (21 mg total) onto the skin daily. 28 patch 0  . UNABLE TO FIND Med Name:  Nugenix     No current facility-administered medications on file prior to visit.     Allergies  Allergen Reactions  . Bee Venom Shortness Of Breath and Swelling    Arm swells  . Hornet Venom Shortness Of Breath and Swelling    Arm swells  . Bee Venom Swelling    Family History  Problem Relation Age of Onset  . Cirrhosis Father   . Colitis Father   . Heart disease Father   . Asthma Father   . Other Father        Chemical Imbalance  . Heart attack Other        Paternal Grandparents  . Stroke Other        Paternal Grandparents  . Prostate cancer Paternal Grandfather   . Diabetes Maternal Grandfather   . Alcohol abuse Mother   . Other Brother        Intestinal Fissure  . Colon polyps Sister        intestinal problems  . Asthma Son   . Other Brother        Chemical Imbalance    Social History   Socioeconomic History  . Marital status: Divorced    Spouse name: Not on file  . Number of children: 3  . Years of education: Not on file  . Highest education level: Not  on file  Occupational History  . Not on file  Social Needs  . Financial resource strain: Not on file  . Food insecurity:    Worry: Not on file    Inability: Not on file  . Transportation needs:    Medical: Not on file    Non-medical: Not on file  Tobacco Use  . Smoking status: Current Every Day Smoker    Packs/day: 1.00    Years: 24.00    Pack years: 24.00    Types: Cigarettes  . Smokeless tobacco: Never Used  Substance and Sexual Activity  . Alcohol use: Yes    Alcohol/week: 0.0 standard drinks  . Drug use: No  . Sexual activity: Yes  Lifestyle  . Physical activity:    Days per week: Not on file    Minutes per session: Not on file  . Stress: Not on file  Relationships  . Social connections:    Talks on phone: Not on file    Gets together: Not on file    Attends religious service: Not on file    Active member of club or organization: Not on file    Attends meetings of clubs or  organizations: Not on file    Relationship status: Not on file  Other Topics Concern  . Not on file  Social History Narrative   ** Merged History Encounter **       Review of Systems - See HPI.  All other ROS are negative.  BP (!) 144/94   Pulse 98   Temp 98.3 F (36.8 C) (Oral)   Resp 18   Ht 6\' 1"  (1.854 m)   Wt 269 lb (122 kg)   SpO2 98%   BMI 35.49 kg/m   Physical Exam Vitals signs reviewed.  Constitutional:      Appearance: Normal appearance.  HENT:     Head: Normocephalic and atraumatic.     Right Ear: Tympanic membrane normal.     Left Ear: Tympanic membrane normal.     Nose: Mucosal edema and rhinorrhea present.     Right Sinus: No maxillary sinus tenderness or frontal sinus tenderness.     Left Sinus: No maxillary sinus tenderness or frontal sinus tenderness.     Mouth/Throat:     Mouth: Mucous membranes are moist.  Neck:     Musculoskeletal: Normal range of motion and neck supple.  Cardiovascular:     Rate and Rhythm: Normal rate and regular rhythm.     Pulses: Normal pulses.     Heart sounds: Normal heart sounds.  Pulmonary:     Effort: Pulmonary effort is normal.     Breath sounds: Normal breath sounds.  Neurological:     Mental Status: He is alert.    Assessment/Plan: 1. Acute bronchitis, unspecified organism With sinus inflammation as well. Rx Doxycycline. Tessalon given for cough.  Increase fluids.  Rest.  Saline nasal spray.  Probiotic.  Mucinex as directed.  Humidifier in bedroom. IM 40 mg depomedrol given to help with inflammation.  Call or return to clinic if symptoms are not improving.  - methylPREDNISolone acetate (DEPO-MEDROL) injection 40 mg   Leeanne Rio, [place]

## 2018-10-19 NOTE — Telephone Encounter (Signed)
Message from [place] sent at 10/19/2018 5:34 PM EST   Pt stated he is at the pharmacy and the gabapentin (NEURONTIN) 300 MG capsule was not called in as Elyn Aquas stated it would. Pt stated he is in lots of pain and requests a call back as soon as possible. Cb# [phone]   Pt was seen in office today with [place],PA

## 2018-10-19 NOTE — Patient Instructions (Signed)
Take antibiotic (Doxycycline) as directed.  Increase fluids.  Get plenty of rest. Use Mucinex for congestion. Tessalon for cough. Take a daily probiotic (I recommend Align or Culturelle, but even Activia Yogurt may be beneficial).  A humidifier placed in the bedroom may offer some relief for a dry, scratchy throat of nasal irritation.  Read information below on acute bronchitis. Please call or return to clinic if symptoms are not improving.  The steroid should help with inflammation and also with chronic pain. Start the [place] instead of the Cyclobenzaprine. I have refilled your Xanax.   Acute Bronchitis Bronchitis is when the airways that extend from the windpipe into the lungs get red, puffy, and painful (inflamed). Bronchitis often causes thick spit (mucus) to develop. This leads to a cough. A cough is the most common symptom of bronchitis. In acute bronchitis, the condition usually begins suddenly and goes away over time (usually in 2 weeks). Smoking, allergies, and asthma can make bronchitis worse. Repeated episodes of bronchitis may cause more lung problems.  HOME CARE  Rest.  Drink enough fluids to keep your pee (urine) clear or pale yellow (unless you need to limit fluids as told by your doctor).  Only take over-the-counter or prescription medicines as told by your doctor.  Avoid smoking and secondhand smoke. These can make bronchitis worse. If you are a smoker, think about using nicotine gum or skin patches. Quitting smoking will help your lungs heal faster.  Reduce the chance of getting bronchitis again by:  Washing your hands often.  Avoiding people with cold symptoms.  Trying not to touch your hands to your mouth, nose, or eyes.  Follow up with your doctor as told.  GET HELP IF: Your symptoms do not improve after 1 week of treatment. Symptoms include:  Cough.  Fever.  Coughing up thick spit.  Body aches.  Chest congestion.  Chills.  Shortness of breath.  Sore  throat.  GET HELP RIGHT AWAY IF:   You have an increased fever.  You have chills.  You have severe shortness of breath.  You have bloody thick spit (sputum).  You throw up (vomit) often.  You lose too much body fluid (dehydration).  You have a severe headache.  You faint.  MAKE SURE YOU:   Understand these instructions.  Will watch your condition.  Will get help right away if you are not doing well or get worse. Document Released: 03/25/2008 Document Revised: 06/09/2013 Document Reviewed: 03/30/2013 [facility] Patient Information 2015 [place], [place]. This information is not intended to replace advice given to you by your health care provider. Make sure you discuss any questions you have with your health care provider.

## 2018-10-20 MED ORDER — GABAPENTIN 300 MG PO CAPS: ORAL_CAPSULE | ORAL | 1 refills | Status: DC

## 2018-10-20 MED FILL — GABAPENTIN 300 MG CAPSULE: 300 | 30 days supply | Qty: 270 | Fill #0

## 2018-10-20 NOTE — Telephone Encounter (Signed)
LM letting patient know that it has been sent.

## 2018-10-20 NOTE — Telephone Encounter (Signed)
Medication has been sent a receipt confirmed by pharmacy.

## 2018-11-16 ENCOUNTER — Other Ambulatory Visit: Payer: Self-pay | Admitting: Physician Assistant

## 2018-11-16 MED FILL — GABAPENTIN 300 MG CAPSULE: 300 | 30 days supply | Qty: 270 | Fill #1

## 2018-11-17 NOTE — Telephone Encounter (Signed)
Last rx for Xanax #30 on 10/19/18 Last OV 10/19/18 Bronchitis  Last rx for Soma 10/19/18  Please advise No CSC

## 2018-11-18 ENCOUNTER — Other Ambulatory Visit: Payer: Self-pay | Admitting: Physician Assistant

## 2018-11-18 MED FILL — CARISOPRODOL 350 MG TABS: 350 | 30 days supply | Qty: 90 | Fill #0

## 2018-11-19 NOTE — Telephone Encounter (Signed)
Last rx for Xanax #30 on 10/19/18 Last OV 10/19/18 Bronchitis  Please advise No CSC

## 2018-12-17 ENCOUNTER — Other Ambulatory Visit: Payer: Self-pay | Admitting: Physician Assistant

## 2018-12-17 DIAGNOSIS — G894 Chronic pain syndrome: Secondary | ICD-10-CM

## 2018-12-17 NOTE — Telephone Encounter (Signed)
Last rx Xanax 10/19/18 #30 Last rx Soma 11/18/18 #90  Patient is overdue for appointment.  Last appointment 10/19/18

## 2018-12-22 ENCOUNTER — Encounter: Payer: Self-pay | Admitting: Emergency Medicine

## 2018-12-22 ENCOUNTER — Ambulatory Visit: Payer: Self-pay | Admitting: Physician Assistant

## 2018-12-22 ENCOUNTER — Other Ambulatory Visit: Payer: Self-pay

## 2018-12-22 ENCOUNTER — Encounter: Payer: Self-pay | Admitting: Physician Assistant

## 2018-12-22 VITALS — BP 130/82 | HR 94 | Temp 98.3°F | Resp 16 | Ht 73.0 in | Wt 277.0 lb

## 2018-12-22 DIAGNOSIS — F329 Major depressive disorder, single episode, unspecified: Secondary | ICD-10-CM

## 2018-12-22 DIAGNOSIS — G894 Chronic pain syndrome: Secondary | ICD-10-CM

## 2018-12-22 DIAGNOSIS — F419 Anxiety disorder, unspecified: Secondary | ICD-10-CM

## 2018-12-22 MED ORDER — ESCITALOPRAM OXALATE 10 MG PO TABS: 10.0000 mg | ORAL_TABLET | Freq: Every day | ORAL | 1 refills | Status: DC

## 2018-12-22 MED ORDER — ALPRAZOLAM 0.5 MG PO TABS: ORAL_TABLET | ORAL | 0 refills | Status: DC

## 2018-12-22 MED ORDER — GABAPENTIN 300 MG PO CAPS: ORAL_CAPSULE | ORAL | 1 refills | Status: DC

## 2018-12-22 MED ORDER — ALPRAZOLAM 0.5 MG PO TABS: ORAL_TABLET | ORAL | 1 refills | Status: DC

## 2018-12-22 MED ORDER — CARISOPRODOL 350 MG PO TABS: 350.0000 mg | ORAL_TABLET | Freq: Three times a day (TID) | ORAL | 0 refills | Status: DC

## 2018-12-22 MED FILL — CARISOPRODOL 350 MG TABS: 350 | 30 days supply | Qty: 90 | Fill #0

## 2018-12-22 MED FILL — GABAPENTIN 300 MG CAPSULE: 300 | 30 days supply | Qty: 270 | Fill #0 | Status: TO

## 2018-12-22 MED FILL — ESCITALOPRAM 10 MG TABLET: 10 | 30 days supply | Qty: 30 | Fill #0 | Status: TO

## 2018-12-22 MED FILL — ALPRAZolam 0.5 MG TABS: 0.5 | 15 days supply | Qty: 30 | Fill #0 | Status: TO
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
## 2018-12-22 NOTE — Patient Instructions (Signed)
Please continue current medication regimen with the following exception:  - Start the Lexapro once daily as directed.  Consider water aerobics as this can help with flexibility and pain. You will be contacted for assessment with Dermatology. You are due for a repeat colonoscopy giving history of polyps. I will look into financial assistance programs for this and contact you.   Follow-up 4-6 weeks for reassessment of mood.

## 2018-12-24 LAB — PAIN MGMT, PROFILE 8 W/CONF, U
6 Acetylmorphine: NEGATIVE ng/mL (ref <10)
ALCOHOL METABOLITES: NEGATIVE ng/mL (ref <500)
Amphetamines: NEGATIVE ng/mL (ref <500)
BENZODIAZEPINES: NEGATIVE ng/mL (ref <100)
BUPRENORPHINE, URINE: NEGATIVE ng/mL (ref <5)
COCAINE METABOLITE: NEGATIVE ng/mL (ref <150)
CREATININE: 37.9 mg/dL
MARIJUANA METABOLITE: 19 ng/mL — AB (ref <5)
MARIJUANA METABOLITE: POSITIVE ng/mL — AB (ref <20)
MDMA: NEGATIVE ng/mL (ref <500)
Opiates: NEGATIVE ng/mL (ref <100)
Oxidant: NEGATIVE ug/mL (ref <200)
Oxycodone: NEGATIVE ng/mL (ref <100)
PH: 6.47 (ref 4.5–9.0)

## 2018-12-26 ENCOUNTER — Encounter (HOSPITAL_BASED_OUTPATIENT_CLINIC_OR_DEPARTMENT_OTHER): Payer: Self-pay | Admitting: Emergency Medicine

## 2018-12-26 ENCOUNTER — Emergency Department (HOSPITAL_BASED_OUTPATIENT_CLINIC_OR_DEPARTMENT_OTHER): Payer: Medicaid Other

## 2018-12-26 ENCOUNTER — Emergency Department (HOSPITAL_BASED_OUTPATIENT_CLINIC_OR_DEPARTMENT_OTHER)
Admission: EM | Admit: 2018-12-26 | Discharge: 2018-12-26 | Disposition: A | Discharge status: Home / Self Care | Payer: Medicaid Other | Attending: Emergency Medicine | Admitting: Emergency Medicine

## 2018-12-26 ENCOUNTER — Other Ambulatory Visit: Payer: Self-pay

## 2018-12-26 DIAGNOSIS — F17210 Nicotine dependence, cigarettes, uncomplicated: Secondary | ICD-10-CM | POA: Diagnosis not present

## 2018-12-26 DIAGNOSIS — Z79899 Other long term (current) drug therapy: Secondary | ICD-10-CM | POA: Diagnosis not present

## 2018-12-26 DIAGNOSIS — M545 Low back pain: Secondary | ICD-10-CM | POA: Diagnosis present

## 2018-12-26 DIAGNOSIS — M5432 Sciatica, left side: Secondary | ICD-10-CM

## 2018-12-26 DIAGNOSIS — Z85528 Personal history of other malignant neoplasm of kidney: Secondary | ICD-10-CM | POA: Diagnosis not present

## 2018-12-26 LAB — COMPREHENSIVE METABOLIC PANEL
ALBUMIN: 4.1 g/dL (ref 3.5–5.0)
ALT: 25 U/L (ref 0–44)
AST: 24 U/L (ref 15–41)
Alkaline Phosphatase: 62 U/L (ref 38–126)
Anion gap: 7 (ref 5–15)
BUN: 16 mg/dL (ref 6–20)
CHLORIDE: 104 mmol/L (ref 98–111)
CO2: 23 mmol/L (ref 22–32)
Calcium: 9.2 mg/dL (ref 8.9–10.3)
Creatinine, Ser: 0.93 mg/dL (ref 0.61–1.24)
GFR calc Af Amer: >60 mL/min (ref >60)
GFR calc non Af Amer: >60 mL/min (ref >60)
Glucose, Bld: 113 mg/dL — ABNORMAL HIGH (ref 70–99)
Potassium: 3.8 mmol/L (ref 3.5–5.1)
Sodium: 134 mmol/L — ABNORMAL LOW (ref 135–145)
Total Bilirubin: 0.4 mg/dL (ref 0.3–1.2)
Total Protein: 7.2 g/dL (ref 6.5–8.1)

## 2018-12-26 LAB — CBC WITH DIFFERENTIAL/PLATELET
Abs Immature Granulocytes: 0.01 10*3/uL (ref 0.00–0.07)
BASOS PCT: 1 %
Basophils Absolute: 0 10*3/uL (ref 0.0–0.1)
Eosinophils Absolute: 0.4 10*3/uL (ref 0.0–0.5)
Eosinophils Relative: 7 %
HCT: 47.2 % (ref 39.0–52.0)
Hemoglobin: 16.1 g/dL (ref 13.0–17.0)
Immature Granulocytes: 0 %
Lymphocytes Relative: 33 %
Lymphs Abs: 2.1 10*3/uL (ref 0.7–4.0)
MCH: 32.7 pg (ref 26.0–34.0)
MCHC: 34.1 g/dL (ref 30.0–36.0)
MCV: 95.7 fL (ref 80.0–100.0)
Monocytes Absolute: 0.6 10*3/uL (ref 0.1–1.0)
Monocytes Relative: 10 %
Neutro Abs: 3.1 10*3/uL (ref 1.7–7.7)
Neutrophils Relative %: 49 %
PLATELETS: 210 10*3/uL (ref 150–400)
RBC: 4.93 MIL/uL (ref 4.22–5.81)
RDW: 12.6 % (ref 11.5–15.5)
WBC: 6.3 10*3/uL (ref 4.0–10.5)
nRBC: 0 % (ref 0.0–0.2)

## 2018-12-26 LAB — URINALYSIS, ROUTINE W REFLEX MICROSCOPIC
Bilirubin Urine: NEGATIVE
Glucose, UA: NEGATIVE mg/dL
Hgb urine dipstick: NEGATIVE
Ketones, ur: NEGATIVE mg/dL
Leukocytes,Ua: NEGATIVE
Nitrite: NEGATIVE
PROTEIN: NEGATIVE mg/dL
Specific Gravity, Urine: 1.01 (ref 1.005–1.030)
pH: 6 (ref 5.0–8.0)

## 2018-12-26 MED ORDER — KETOROLAC TROMETHAMINE 30 MG/ML IJ SOLN
30.0000 mg | Freq: Once | INTRAMUSCULAR | Status: AC
  Administered 2018-12-26: 30 mg via INTRAVENOUS
  Filled 2018-12-26: qty 1

## 2018-12-26 MED ORDER — NAPROXEN 500 MG PO TABS: 500.0000 mg | ORAL_TABLET | Freq: Two times a day (BID) | ORAL | 0 refills | Status: DC

## 2018-12-26 MED ORDER — OXYCODONE-ACETAMINOPHEN 5-325 MG PO TABS: 2.0000 | ORAL_TABLET | Freq: Four times a day (QID) | ORAL | 0 refills | Status: DC | PRN

## 2018-12-26 MED ORDER — ONDANSETRON HCL 4 MG/2ML IJ SOLN
4.0000 mg | Freq: Once | INTRAMUSCULAR | Status: AC
  Administered 2018-12-26: 4 mg via INTRAVENOUS
  Filled 2018-12-26: qty 2

## 2018-12-26 MED ORDER — DEXAMETHASONE SODIUM PHOSPHATE 10 MG/ML IJ SOLN
10.0000 mg | Freq: Once | INTRAMUSCULAR | Status: AC
  Administered 2018-12-26: 10 mg via INTRAVENOUS
  Filled 2018-12-26: qty 1

## 2018-12-26 MED ORDER — HYDROMORPHONE HCL 1 MG/ML IJ SOLN
1.0000 mg | Freq: Once | INTRAMUSCULAR | Status: AC
  Administered 2018-12-26: 1 mg via INTRAVENOUS
  Filled 2018-12-26: qty 1

## 2018-12-26 MED ORDER — OXYCODONE-ACETAMINOPHEN 5-325 MG PO TABS
2.0000 | ORAL_TABLET | Freq: Once | ORAL | Status: AC
  Administered 2018-12-26: 2 via ORAL
  Filled 2018-12-26: qty 2

## 2018-12-26 NOTE — ED Provider Notes (Signed)
TIME SEEN: 12:16 AM  CHIEF COMPLAINT: back pain  HPI: Patient is a 43 year old male with history of renal cell carcinoma status post left partial nephrectomy in 2014, chronic back pain who presents the emergency department with increasing back pain on the left lower side for the past 2 to 3 days.  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  States the pain radiates in the posterior leg and into the groin at times and has some numbness to the buttock and anterior thigh.  No other numbness or weakness.  No bowel or bladder incontinence.  Has had some intermittent dysuria.  Reports he has had nausea, vomiting and diarrhea as well.  Reports chills and feeling hot but no documented fevers.  States he did not have to have chemotherapy or radiation for his renal cell carcinoma and that was resolved with his nephrectomy.  He does take Xanax for anxiety and Soma for chronic back pain regularly.  States these have not been helping his symptoms.  Denies any other aggravating or alleviating factors.  ROS: See HPI Constitutional: no fever  Eyes: no drainage  ENT: no runny nose   Cardiovascular:  no chest pain  Resp: no SOB  GI: no vomiting GU: no dysuria Integumentary: no rash  Allergy: no hives  Musculoskeletal: no leg swelling  Neurological: no slurred speech ROS otherwise negative  PAST MEDICAL HISTORY/PAST SURGICAL HISTORY:  Past Medical History:  Diagnosis Date  . Anxiety and depression 08/01/2013  . Cancer ([place])   . Cellulitis    left leg and stomach  . Chicken pox   . Diarrhea 08/01/2013  . History of kidney cancer   . Hx of vasectomy   . Renal cell carcinoma ([place]) 06/01/2013    MEDICATIONS:  Prior to Admission medications   Medication Sig Start Date End Date Taking? Authorizing Provider  Acetaminophen (TYLENOL ARTHRITIS EXT RELIEF PO) Take 1-2 tablets by mouth every 8 (eight) hours as needed.    [provider]  albuterol  (PROVENTIL HFA;VENTOLIN HFA) 108 (90 Base) MCG/ACT inhaler Inhale 1-2 puffs into the lungs every 6 (six) hours as needed for wheezing or shortness of breath. 09/08/18   Long, Wonda Olds, MD  ALPRAZolam (XANAX) 0.5 MG tablet TAKE 1 TABLET BY MOUTH 2 TIMES DAILY AS NEEDED FOR ANXIETY. 12/22/18   Brunetta Jeans, PA-C  Ascorbic Acid (VITAMIN C) 1000 MG tablet Take 1,000 mg by mouth daily.    [provider]  carisoprodol (SOMA) 350 MG tablet Take 1 tablet (350 mg total) by mouth 3 (three) times daily. 12/22/18   Brunetta Jeans, PA-C  diphenhydrAMINE (BENADRYL) 25 MG tablet Take 25 mg by mouth every 6 (six) hours as needed.    [provider]  escitalopram (LEXAPRO) 10 MG tablet Take 1 tablet (10 mg total) by mouth daily. 12/22/18   Brunetta Jeans, PA-C  famotidine (PEPCID) 20 MG tablet Take 1 tablet (20 mg total) by mouth 2 (two) times daily. Patient not taking: Reported on 12/22/2018 06/15/18   Fredia Sorrow, MD  gabapentin (NEURONTIN) 300 MG capsule Take 3 capsules in morning, 3 capsules in afternoon, 3 capsules at bedtime. 12/22/18   Brunetta Jeans, PA-C  Multiple Vitamins-Minerals (MULTIVITAMIN ADULTS 50+ PO) Take 1 tablet by mouth daily.    [provider]  nicotine (NICODERM CQ - DOSED IN MG/24 HOURS) 21 mg/24hr patch Place 1 patch (21 mg total) onto the skin daily. Patient not taking:  Reported on 12/22/2018 09/08/18   Long, Wonda Olds, MD  UNABLE TO FIND Med Name: Nugenix    [provider]    ALLERGIES:  Allergies  Allergen Reactions  . Bee Venom Shortness Of Breath and Swelling    Arm swells  . Hornet Venom Shortness Of Breath and Swelling    Arm swells  . Bee Venom Swelling    SOCIAL HISTORY:  Social History   Tobacco Use  . Smoking status: Current Every Day Smoker    Packs/day: 1.00    Years: 24.00    Pack years: 24.00    Types: Cigarettes  . Smokeless tobacco: Never Used  Substance Use Topics  . Alcohol use: Yes    Alcohol/week: 0.0  standard drinks    FAMILY HISTORY: Family History  Problem Relation Age of Onset  . Cirrhosis Father   . Colitis Father   . Heart disease Father   . Asthma Father   . Other Father        Chemical Imbalance  . Heart attack Other        Paternal Grandparents  . Stroke Other        Paternal Grandparents  . Prostate cancer Paternal Grandfather   . Diabetes Maternal Grandfather   . Alcohol abuse Mother   . Other Brother        Intestinal Fissure  . Colon polyps Sister        intestinal problems  . Asthma Son   . Other Brother        Chemical Imbalance    EXAM: BP (!) 135/110   Pulse (!) 103   Temp (!) 97.5 F (36.4 C) (Oral)   Resp 20   Ht 6\' 1"  (1.854 m)   Wt 125.6 kg   SpO2 100%   BMI 36.53 kg/m  CONSTITUTIONAL: Alert and oriented and responds appropriately to questions.  Appears uncomfortable, afebrile HEAD: Normocephalic EYES: Conjunctivae clear, pupils appear equal, EOMI ENT: normal nose; moist mucous membranes NECK: Supple, no meningismus, no nuchal rigidity, no LAD  CARD: RRR; S1 and S2 appreciated; no murmurs, no clicks, no rubs, no gallops RESP: Normal chest excursion without splinting or tachypnea; breath sounds clear and equal bilaterally; no wheezes, no rhonchi, no rales, no hypoxia or respiratory distress, speaking full sentences ABD/GI: Normal bowel sounds; non-distended; soft, ender in the left upper quadrant, no rebound, no guarding, no peritoneal signs, no hepatosplenomegaly BACK:  The back appears normal and is tender to palpation over the left SI joint without ecchymosis, redness, swelling or warmth.  No midline spinal tenderness or step-off or deformity. EXT: Normal ROM in all joints; non-tender to palpation; no edema; normal capillary refill; no cyanosis, no calf tenderness or swelling    SKIN: Normal color for age and race; warm; no rash NEURO: Moves all extremities equally, strength 5/5 in all 4 extremities, sensation to light touch intact  diffusely, no saddle anesthesia, normal speech PSYCH: The patient's mood and manner are appropriate. Grooming and personal hygiene are appropriate.  MEDICAL DECISION MAKING: Patient here with complaints of back pain with chills, dysuria, vomiting and diarrhea.  Back pain seems to be musculoskeletal in nature but given his history of previous renal cell carcinoma with complaints of dysuria, vomiting and some abdominal pain on exam, will obtain CT scan for further evaluation and to evaluate for kidney stone, renal mass, pyelonephritis, colitis.  Doubt appendicitis.  Doubt bowel obstruction.  Will give Dilaudid, Zofran for symptomatic relief.  States his girlfriend drove  him to the emergency department.  Doubt cauda equina, epidural abscess or hematoma, discitis or osteomyelitis, transverse myelitis.  I do not feel he needs emergent MRI of his spine.  ED PROGRESS: CT scan shows no kidney stone and no sign of recurrent mass on the left kidney.  Urine shows no sign of infection or blood.  Labs unremarkable.  Suspect this is more musculoskeletal in nature, possible radiculopathy versus sacroiliitis.  Reports no improvement of pain after Dilaudid.  Will give Toradol, Decadron.  He is asking what pain medication we are sending him home with.  Plan to send him home with prescriptions of naproxen and Percocet.  He is requesting Percocet prior to discharge.  We will give him 2 Percocet here in the ED.  Recommended stretching, alternating heat and ice, anti-inflammatories.  Recommended follow-up with his primary care physician if medical management is not improving his symptoms he may benefit from outpatient MRI.  He is comfortable with this plan.  He does have a non-opioid contract that he signed with his PCP on March 2 given he receives regular alprazolam.  He states that this is not an opioid contract and it appears he has not received any narcotic pain medication since August 2019.  Vital signs have improved his pain  has improved.  He is resting comfortably in bed.  Girlfriend here to drive him home.   At this time, I do not feel there is any life-threatening condition present. I have reviewed and discussed all results (EKG, imaging, lab, urine as appropriate) and exam findings with patient/family. I have reviewed nursing notes and appropriate previous records.  I feel the patient is safe to be discharged home without further emergent workup and can continue workup as an outpatient as needed. Discussed usual and customary return precautions. Patient/family verbalize understanding and are comfortable with this plan.  Outpatient follow-up has been provided as needed. All questions have been answered.    Unknown Schleyer, Delice Bison, DO 12/26/18 [phone]

## 2018-12-26 NOTE — ED Triage Notes (Signed)
Pt states his back on the left side started hurting two days ago. States that he has a history of kidney cancer on that side. States that it wraps around and is going into his groin and down his leg.

## 2018-12-26 NOTE — ED Notes (Signed)
Patient transported to CT 

## 2018-12-26 NOTE — ED Notes (Signed)
Pt verbalizes understanding of d/c instructions and denies any further needs at this time. 

## 2018-12-26 NOTE — Discharge Instructions (Addendum)
You are being provided a prescription for opiates (also known as narcotics) for pain control.  Opiates can be addictive and should only be used when absolutely necessary for pain control when other alternatives do not work.  We recommend you only use them for the recommended amount of time and only as prescribed.  Please do not take with other sedative medications or alcohol.  Please do not drive, operate machinery, make important decisions while taking opiates.  Please note that these medications can be addictive and have high abuse potential.  Patients can become addicted to narcotics after only taking them for a few days.  Please keep these medications locked away from children, teenagers or any family members with history of substance abuse.  Narcotic pain medicine may also make you constipated.  You may use over-the-counter medications such as MiraLAX, Colace to prevent constipation.  If you become constipated you may use over-the-counter enemas as needed.  Itching and nausea are common side effects of narcotic pain medication.  If you develop uncontrolled vomiting or a rash, please stop these medications.   You may continue your Soma and alprazolam as prescribed by your primary care physician but I do not recommend that you take this at the same time as the Percocet as the combination of these 3 medications can make you very drowsy.  I recommend that you separate these medications by at least 2 hours.  Do not drink any alcohol when taking these medications.

## 2019-01-11 ENCOUNTER — Other Ambulatory Visit: Payer: Self-pay | Admitting: Physician Assistant

## 2019-01-11 MED FILL — ESCITALOPRAM 10 MG TABLET: 10 | 30 days supply | Qty: 30 | Fill #0

## 2019-01-11 MED FILL — GABAPENTIN 300 MG CAPSULE: 300 | 30 days supply | Qty: 270 | Fill #0

## 2019-01-11 NOTE — Telephone Encounter (Signed)
Soma last rx 12/22/18 #90 LOV: 12/22/18 Next OV: 02/01/19 .

## 2019-01-12 MED FILL — ALPRAZolam 0.5 MG TABS: 0.5 | 15 days supply | Qty: 30 | Fill #0

## 2019-01-18 NOTE — Telephone Encounter (Signed)
Pt called and stated Outpatient pharmacy mailed his medications but left of the carisoprodol (SOMA) 350 MG tablet  He stated he is now out at this time. Please advise.

## 2019-01-20 ENCOUNTER — Other Ambulatory Visit: Payer: Self-pay | Admitting: Physician Assistant

## 2019-01-20 MED ORDER — CARISOPRODOL 350 MG PO TABS: 350.0000 mg | ORAL_TABLET | Freq: Three times a day (TID) | ORAL | 0 refills | Status: DC

## 2019-01-20 MED FILL — CARISOPRODOL 350 MG TABS: 350 | 30 days supply | Qty: 90 | Fill #0

## 2019-01-20 NOTE — Telephone Encounter (Signed)
No max dose is 1 tablet TID of the Soma which has been refilled.  No increase in quantity will be given.  We can increase Gabapentin further if needed to 3, 3 and 4 (QHS). Otherwise we will need pain specialist on board for further changes.

## 2019-01-20 NOTE — Telephone Encounter (Signed)
Patient informed and agreed. Virtual Visit scheduled by Levada Dy.

## 2019-01-20 NOTE — Telephone Encounter (Signed)
LMOVM to return call.

## 2019-01-20 NOTE — Addendum Note (Signed)
Addended by: Brunetta Jeans on: 01/20/2019 03:14 PM   Modules accepted: Orders

## 2019-01-20 NOTE — Telephone Encounter (Signed)
Pt called in to follow up on refill request for medication. Advised pt that request has been sent to provider, pt says that he has been out for days and is not sure what is happening.  Explained to pt that medication was originally requested to soon, pt expressed understanding.    Pt would like to know if provider could increase the number of pills prescribed? Pt says that he is taking 2 pills 3 times daily to keep the pain away.   Please advise.

## 2019-02-01 ENCOUNTER — Ambulatory Visit: Payer: Self-pay | Admitting: Physician Assistant

## 2019-02-01 ENCOUNTER — Encounter: Payer: Self-pay | Admitting: Physician Assistant

## 2019-02-02 NOTE — Progress Notes (Signed)
This encounter was created in error - please disregard.

## 2019-02-19 ENCOUNTER — Other Ambulatory Visit: Payer: Self-pay | Admitting: Physician Assistant

## 2019-02-19 MED ORDER — CARISOPRODOL 350 MG PO TABS: 350.0000 mg | ORAL_TABLET | Freq: Three times a day (TID) | ORAL | 0 refills | Status: DC

## 2019-02-19 MED ORDER — ALPRAZOLAM 0.5 MG PO TABS: ORAL_TABLET | ORAL | 0 refills | Status: DC

## 2019-02-22 ENCOUNTER — Ambulatory Visit (INDEPENDENT_AMBULATORY_CARE_PROVIDER_SITE_OTHER): Payer: Self-pay | Admitting: Physician Assistant

## 2019-02-22 ENCOUNTER — Encounter: Payer: Self-pay | Admitting: Physician Assistant

## 2019-02-22 ENCOUNTER — Other Ambulatory Visit: Payer: Self-pay

## 2019-02-22 VITALS — HR 84

## 2019-02-22 DIAGNOSIS — Z20822 Contact with and (suspected) exposure to covid-19: Secondary | ICD-10-CM

## 2019-02-22 DIAGNOSIS — F419 Anxiety disorder, unspecified: Secondary | ICD-10-CM

## 2019-02-22 DIAGNOSIS — F329 Major depressive disorder, single episode, unspecified: Secondary | ICD-10-CM

## 2019-02-22 DIAGNOSIS — R6889 Other general symptoms and signs: Secondary | ICD-10-CM

## 2019-02-22 MED ORDER — CARISOPRODOL 350 MG PO TABS: 350.0000 mg | ORAL_TABLET | Freq: Three times a day (TID) | ORAL | 0 refills | Status: DC

## 2019-02-22 MED ORDER — ALPRAZOLAM 0.5 MG PO TABS: ORAL_TABLET | ORAL | 0 refills | Status: DC

## 2019-02-22 MED ORDER — ALBUTEROL SULFATE HFA 108 (90 BASE) MCG/ACT IN AERS: 1.0000 | INHALATION_SPRAY | Freq: Four times a day (QID) | RESPIRATORY_TRACT | 0 refills | Status: DC | PRN

## 2019-02-22 MED ORDER — BENZONATATE 100 MG PO CAPS: 100.0000 mg | ORAL_CAPSULE | Freq: Two times a day (BID) | ORAL | 0 refills | Status: DC | PRN

## 2019-02-22 MED ORDER — ESCITALOPRAM OXALATE 10 MG PO TABS: 10.0000 mg | ORAL_TABLET | Freq: Every day | ORAL | 1 refills | Status: DC

## 2019-02-22 NOTE — Patient Instructions (Signed)
Instructions sent to MyChart.   Please keep hydrated and try to rest.  I have sent in a prescription cough medicine to help with these symptoms. I have also refilled your albuterol inhaler to use as directed when needed for chest tightness and windedness. If symptoms are not easing up please let us know.  If there is any worsening shortness of breath, new onset chest pain, recurrence of dizziness with this, or if you cannot keep fluids in, please go directly to ER or call 911.    Infection Prevention Recommendations for Individuals Confirmed to have, or Being Evaluated for, 2019 Novel Coronavirus (COVID-19) Infection Who Receive Care at Home  Individuals who are confirmed to have, or are being evaluated for, COVID-19 should follow the prevention steps below until a healthcare provider or local or state health department says they can return to normal activities.  Stay home except to get medical care You should restrict activities outside your home, except for getting medical care. Do not go to work, school, or public areas, and do not use public transportation or taxis.  Call ahead before visiting your doctor Before your medical appointment, call the healthcare provider and tell them that you have, or are being evaluated for, COVID-19 infection. This will help the healthcare provider's office take steps to keep other people from getting infected. Ask your healthcare provider to call the local or state health department.  Monitor your symptoms Seek prompt medical attention if your illness is worsening (e.g., difficulty breathing). Before going to your medical appointment, call the healthcare provider and tell them that you have, or are being evaluated for, COVID-19 infection. Ask your healthcare provider to call the local or state health department.  Wear a facemask You should wear a facemask that covers your nose and mouth when you are in the same room with other people and when you  visit a healthcare provider. People who live with or visit you should also wear a facemask while they are in the same room with you.  Separate yourself from other people in your home As much as possible, you should stay in a different room from other people in your home. Also, you should use a separate bathroom, if available.  Avoid sharing household items You should not share dishes, drinking glasses, cups, eating utensils, towels, bedding, or other items with other people in your home. After using these items, you should wash them thoroughly with soap and water.  Cover your coughs and sneezes Cover your mouth and nose with a tissue when you cough or sneeze, or you can cough or sneeze into your sleeve. Throw used tissues in a lined trash can, and immediately wash your hands with soap and water for at least 20 seconds or use an alcohol-based hand rub.  Wash your [facility] your hands often and thoroughly with soap and water for at least 20 seconds. You can use an alcohol-based hand sanitizer if soap and water are not available and if your hands are not visibly dirty. Avoid touching your eyes, nose, and mouth with unwashed hands.   Prevention Steps for Caregivers and Household Members of Individuals Confirmed to have, or Being Evaluated for, COVID-19 Infection Being Cared for in the Home  If you live with, or provide care at home for, a person confirmed to have, or being evaluated for, COVID-19 infection please follow these guidelines to prevent infection:  Follow healthcare provider's instructions Make sure that you understand and can help the patient follow any  healthcare provider instructions for all care.  Provide for the patient's basic needs You should help the patient with basic needs in the home and provide support for getting groceries, prescriptions, and other personal needs.  Monitor the patient's symptoms If they are getting sicker, call his or her medical provider and  tell them that the patient has, or is being evaluated for, COVID-19 infection. This will help the healthcare provider's office take steps to keep other people from getting infected. Ask the healthcare provider to call the local or state health department.  Limit the number of people who have contact with the patient  If possible, have only one caregiver for the patient.  Other household members should stay in another home or place of residence. If this is not possible, they should stay  in another room, or be separated from the patient as much as possible. Use a separate bathroom, if available.  Restrict visitors who do not have an essential need to be in the home.  Keep older adults, very young children, and other sick people away from the patient Keep older adults, very young children, and those who have compromised immune systems or chronic health conditions away from the patient. This includes people with chronic heart, lung, or kidney conditions, diabetes, and cancer.  Ensure good ventilation Make sure that shared spaces in the home have good air flow, such as from an air conditioner or an opened window, weather permitting.  Wash your hands often  Wash your hands often and thoroughly with soap and water for at least 20 seconds. You can use an alcohol based hand sanitizer if soap and water are not available and if your hands are not visibly dirty.  Avoid touching your eyes, nose, and mouth with unwashed hands.  Use disposable paper towels to dry your hands. If not available, use dedicated cloth towels and replace them when they become wet.  Wear a facemask and gloves  Wear a disposable facemask at all times in the room and gloves when you touch or have contact with the patient's blood, body fluids, and/or secretions or excretions, such as sweat, saliva, sputum, nasal mucus, vomit, urine, or feces.  Ensure the mask fits over your nose and mouth tightly, and do not touch it during  use.  Throw out disposable facemasks and gloves after using them. Do not reuse.  Wash your hands immediately after removing your facemask and gloves.  If your personal clothing becomes contaminated, carefully remove clothing and launder. Wash your hands after handling contaminated clothing.  Place all used disposable facemasks, gloves, and other waste in a lined container before disposing them with other household waste.  Remove gloves and wash your hands immediately after handling these items.  Do not share dishes, glasses, or other household items with the patient  Avoid sharing household items. You should not share dishes, drinking glasses, cups, eating utensils, towels, bedding, or other items with a patient who is confirmed to have, or being evaluated for, COVID-19 infection.  After the person uses these items, you should wash them thoroughly with soap and water.  Wash laundry thoroughly  Immediately remove and wash clothes or bedding that have blood, body fluids, and/or secretions or excretions, such as sweat, saliva, sputum, nasal mucus, vomit, urine, or feces, on them.  Wear gloves when handling laundry from the patient.  Read and follow directions on labels of laundry or clothing items and detergent. In general, wash and dry with the warmest temperatures recommended on the  label.  Clean all areas the individual has used often  Clean all touchable surfaces, such as counters, tabletops, doorknobs, bathroom fixtures, toilets, phones, keyboards, tablets, and bedside tables, every day. Also, clean any surfaces that may have blood, body fluids, and/or secretions or excretions on them.  Wear gloves when cleaning surfaces the patient has come in contact with.  Use a diluted bleach solution (e.g., dilute bleach with 1 part bleach and 10 parts water) or a household disinfectant with a label that says EPA-registered for coronaviruses. To make a bleach solution at home, add 1 tablespoon  of bleach to 1 quart (4 cups) of water. For a larger supply, add  cup of bleach to 1 gallon (16 cups) of water.  Read labels of cleaning products and follow recommendations provided on product labels. Labels contain instructions for safe and effective use of the cleaning product including precautions you should take when applying the product, such as wearing gloves or eye protection and making sure you have good ventilation during use of the product.  Remove gloves and wash hands immediately after cleaning.  Monitor yourself for signs and symptoms of illness Caregivers and household members are considered close contacts, should monitor their health, and will be asked to limit movement outside of the home to the extent possible. Follow the monitoring steps for close contacts listed on the symptom monitoring form.   ? If you have additional questions, contact your local health department or call the epidemiologist on call at [phone] (available 24/7). ? This guidance is subject to change. For the most up-to-date guidance from [facility], please refer to their website: [email or web address]
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
## 2019-02-22 NOTE — Progress Notes (Signed)
I have discussed the procedure for the virtual visit with the patient who has given consent to proceed with assessment and treatment.   Myia Bergh S Franz Svec, CMA     

## 2019-02-25 ENCOUNTER — Emergency Department (HOSPITAL_BASED_OUTPATIENT_CLINIC_OR_DEPARTMENT_OTHER): Payer: HRSA Program

## 2019-02-25 ENCOUNTER — Encounter (HOSPITAL_BASED_OUTPATIENT_CLINIC_OR_DEPARTMENT_OTHER): Payer: Self-pay

## 2019-02-25 ENCOUNTER — Other Ambulatory Visit: Payer: Self-pay

## 2019-02-25 ENCOUNTER — Emergency Department (HOSPITAL_BASED_OUTPATIENT_CLINIC_OR_DEPARTMENT_OTHER)
Admission: EM | Admit: 2019-02-25 | Discharge: 2019-02-25 | Disposition: A | Discharge status: Home / Self Care | Payer: HRSA Program | Attending: Emergency Medicine | Admitting: Emergency Medicine

## 2019-02-25 DIAGNOSIS — R062 Wheezing: Secondary | ICD-10-CM | POA: Insufficient documentation

## 2019-02-25 DIAGNOSIS — Z20828 Contact with and (suspected) exposure to other viral communicable diseases: Secondary | ICD-10-CM | POA: Diagnosis not present

## 2019-02-25 DIAGNOSIS — R42 Dizziness and giddiness: Secondary | ICD-10-CM | POA: Diagnosis present

## 2019-02-25 DIAGNOSIS — K219 Gastro-esophageal reflux disease without esophagitis: Secondary | ICD-10-CM | POA: Insufficient documentation

## 2019-02-25 DIAGNOSIS — Z1159 Encounter for screening for other viral diseases: Secondary | ICD-10-CM | POA: Diagnosis not present

## 2019-02-25 DIAGNOSIS — F17210 Nicotine dependence, cigarettes, uncomplicated: Secondary | ICD-10-CM | POA: Diagnosis not present

## 2019-02-25 DIAGNOSIS — J029 Acute pharyngitis, unspecified: Secondary | ICD-10-CM | POA: Insufficient documentation

## 2019-02-25 DIAGNOSIS — Z79899 Other long term (current) drug therapy: Secondary | ICD-10-CM | POA: Diagnosis not present

## 2019-02-25 DIAGNOSIS — R51 Headache: Secondary | ICD-10-CM | POA: Diagnosis not present

## 2019-02-25 DIAGNOSIS — R6889 Other general symptoms and signs: Principal | ICD-10-CM

## 2019-02-25 DIAGNOSIS — Z905 Acquired absence of kidney: Secondary | ICD-10-CM | POA: Insufficient documentation

## 2019-02-25 DIAGNOSIS — Z85520 Personal history of malignant carcinoid tumor of kidney: Secondary | ICD-10-CM | POA: Insufficient documentation

## 2019-02-25 DIAGNOSIS — R0602 Shortness of breath: Secondary | ICD-10-CM | POA: Diagnosis not present

## 2019-02-25 DIAGNOSIS — D126 Benign neoplasm of colon, unspecified: Secondary | ICD-10-CM | POA: Diagnosis not present

## 2019-02-25 DIAGNOSIS — Z20822 Contact with and (suspected) exposure to covid-19: Secondary | ICD-10-CM

## 2019-02-25 DIAGNOSIS — R531 Weakness: Secondary | ICD-10-CM | POA: Insufficient documentation

## 2019-02-25 DIAGNOSIS — K5900 Constipation, unspecified: Secondary | ICD-10-CM | POA: Insufficient documentation

## 2019-02-25 HISTORY — DX: Other chronic pain: G89.29

## 2019-02-25 LAB — CBC WITH DIFFERENTIAL/PLATELET
Abs Immature Granulocytes: 0.04 10*3/uL (ref 0.00–0.07)
Basophils Absolute: 0 10*3/uL (ref 0.0–0.1)
Basophils Relative: 1 %
Eosinophils Absolute: 0.2 10*3/uL (ref 0.0–0.5)
Eosinophils Relative: 2 %
HCT: 44.5 % (ref 39.0–52.0)
Hemoglobin: 15.2 g/dL (ref 13.0–17.0)
Immature Granulocytes: 1 %
Lymphocytes Relative: 20 %
Lymphs Abs: 1.6 10*3/uL (ref 0.7–4.0)
MCH: 32.7 pg (ref 26.0–34.0)
MCHC: 34.2 g/dL (ref 30.0–36.0)
MCV: 95.7 fL (ref 80.0–100.0)
Monocytes Absolute: 0.6 10*3/uL (ref 0.1–1.0)
Monocytes Relative: 8 %
Neutro Abs: 5.6 10*3/uL (ref 1.7–7.7)
Neutrophils Relative %: 68 %
Platelets: 189 10*3/uL (ref 150–400)
RBC: 4.65 MIL/uL (ref 4.22–5.81)
RDW: 12.6 % (ref 11.5–15.5)
WBC: 8.1 10*3/uL (ref 4.0–10.5)
nRBC: 0 % (ref 0.0–0.2)

## 2019-02-25 LAB — COMPREHENSIVE METABOLIC PANEL
ALT: 35 U/L (ref 0–44)
AST: 31 U/L (ref 15–41)
Albumin: 4.4 g/dL (ref 3.5–5.0)
Alkaline Phosphatase: 60 U/L (ref 38–126)
Anion gap: 10 (ref 5–15)
BUN: 13 mg/dL (ref 6–20)
CO2: 25 mmol/L (ref 22–32)
Calcium: 9.2 mg/dL (ref 8.9–10.3)
Chloride: 103 mmol/L (ref 98–111)
Creatinine, Ser: 1.02 mg/dL (ref 0.61–1.24)
GFR calc Af Amer: >60 mL/min (ref >60)
GFR calc non Af Amer: >60 mL/min (ref >60)
Glucose, Bld: 148 mg/dL — ABNORMAL HIGH (ref 70–99)
Potassium: 3.6 mmol/L (ref 3.5–5.1)
Sodium: 138 mmol/L (ref 135–145)
Total Bilirubin: 0.5 mg/dL (ref 0.3–1.2)
Total Protein: 7.1 g/dL (ref 6.5–8.1)

## 2019-02-25 LAB — URINALYSIS, ROUTINE W REFLEX MICROSCOPIC
Bilirubin Urine: NEGATIVE
Glucose, UA: NEGATIVE mg/dL
Hgb urine dipstick: NEGATIVE
Ketones, ur: NEGATIVE mg/dL
Leukocytes,Ua: NEGATIVE
Nitrite: NEGATIVE
Protein, ur: NEGATIVE mg/dL
Specific Gravity, Urine: <1.005 — ABNORMAL LOW (ref 1.005–1.030)
pH: 6 (ref 5.0–8.0)

## 2019-02-25 LAB — TROPONIN I: Troponin I: <0.03 ng/mL (ref <0.03)

## 2019-02-25 LAB — D-DIMER, QUANTITATIVE: D-Dimer, Quant: 1.93 ug/mL-FEU — ABNORMAL HIGH (ref 0.00–0.50)

## 2019-02-25 LAB — LIPASE, BLOOD: Lipase: 34 U/L (ref 11–51)

## 2019-02-25 MED ORDER — PANTOPRAZOLE SODIUM 20 MG PO TBEC: 20.0000 mg | DELAYED_RELEASE_TABLET | Freq: Every day | ORAL | 0 refills | Status: DC

## 2019-02-25 MED ORDER — IOHEXOL 350 MG/ML SOLN
100.0000 mL | Freq: Once | INTRAVENOUS | Status: AC | PRN
  Administered 2019-02-25: 100 mL via INTRAVENOUS

## 2019-02-25 MED ORDER — SODIUM CHLORIDE 0.9 % IV BOLUS
1000.0000 mL | Freq: Once | INTRAVENOUS | Status: AC
  Administered 2019-02-25: 17:00:00 1000 mL via INTRAVENOUS

## 2019-02-25 MED ORDER — LIDOCAINE VISCOUS HCL 2 % MT SOLN
15.0000 mL | Freq: Once | OROMUCOSAL | Status: AC
  Administered 2019-02-25: 17:00:00 15 mL via ORAL
  Filled 2019-02-25: qty 15

## 2019-02-25 MED ORDER — ALUM & MAG HYDROXIDE-SIMETH 200-200-20 MG/5ML PO SUSP
30.0000 mL | Freq: Once | ORAL | Status: AC
  Administered 2019-02-25: 30 mL via ORAL
  Filled 2019-02-25: qty 30

## 2019-02-25 NOTE — Discharge Instructions (Signed)
Thank you for allowing me to care for you today. Please return to the emergency department if you have new or worsening symptoms. Take your medications as instructed.  ° °

## 2019-02-25 NOTE — ED Notes (Signed)
Pt returned from CT °

## 2019-02-25 NOTE — ED Notes (Signed)
ED Provider at bedside. 

## 2019-02-25 NOTE — ED Notes (Signed)
Pt signed CDC paperwork for pending COVID result, verbalizes understanding of dc instructions and denies any further needs at this time

## 2019-02-25 NOTE — ED Triage Notes (Signed)
Pt c/o fatigue, weakness x months-recent n/v/d, abd pain-NAD-steady gait

## 2019-02-25 NOTE — ED Notes (Signed)
Pt transported to CT ?
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
## 2019-02-26 LAB — NOVEL CORONAVIRUS, NAA (HOSP ORDER, SEND-OUT TO REF LAB; TAT 18-24 HRS): SARS-CoV-2, NAA: NOT DETECTED

## 2019-03-01 ENCOUNTER — Emergency Department (HOSPITAL_BASED_OUTPATIENT_CLINIC_OR_DEPARTMENT_OTHER)
Admission: EM | Admit: 2019-03-01 | Discharge: 2019-03-01 | Disposition: A | Discharge status: Home / Self Care | Payer: Medicaid Other | Attending: Emergency Medicine | Admitting: Emergency Medicine

## 2019-03-01 ENCOUNTER — Encounter: Payer: Self-pay | Admitting: Physician Assistant

## 2019-03-01 ENCOUNTER — Ambulatory Visit (INDEPENDENT_AMBULATORY_CARE_PROVIDER_SITE_OTHER): Payer: Self-pay | Admitting: Physician Assistant

## 2019-03-01 ENCOUNTER — Encounter (HOSPITAL_BASED_OUTPATIENT_CLINIC_OR_DEPARTMENT_OTHER): Payer: Self-pay

## 2019-03-01 ENCOUNTER — Other Ambulatory Visit: Payer: Self-pay

## 2019-03-01 VITALS — BP 178/118 | HR 80 | Temp 96.8°F | Resp 18

## 2019-03-01 DIAGNOSIS — R1013 Epigastric pain: Secondary | ICD-10-CM

## 2019-03-01 DIAGNOSIS — Z79899 Other long term (current) drug therapy: Secondary | ICD-10-CM | POA: Insufficient documentation

## 2019-03-01 DIAGNOSIS — R519 Headache, unspecified: Secondary | ICD-10-CM

## 2019-03-01 DIAGNOSIS — R197 Diarrhea, unspecified: Secondary | ICD-10-CM | POA: Diagnosis not present

## 2019-03-01 DIAGNOSIS — F17210 Nicotine dependence, cigarettes, uncomplicated: Secondary | ICD-10-CM | POA: Insufficient documentation

## 2019-03-01 DIAGNOSIS — R42 Dizziness and giddiness: Secondary | ICD-10-CM

## 2019-03-01 DIAGNOSIS — R109 Unspecified abdominal pain: Secondary | ICD-10-CM | POA: Diagnosis present

## 2019-03-01 DIAGNOSIS — R030 Elevated blood-pressure reading, without diagnosis of hypertension: Secondary | ICD-10-CM | POA: Diagnosis not present

## 2019-03-01 DIAGNOSIS — F419 Anxiety disorder, unspecified: Secondary | ICD-10-CM | POA: Diagnosis not present

## 2019-03-01 DIAGNOSIS — R51 Headache: Secondary | ICD-10-CM | POA: Insufficient documentation

## 2019-03-01 DIAGNOSIS — I10 Essential (primary) hypertension: Secondary | ICD-10-CM

## 2019-03-01 LAB — COMPREHENSIVE METABOLIC PANEL
ALT: 35 U/L (ref 0–44)
AST: 25 U/L (ref 15–41)
Albumin: 4.2 g/dL (ref 3.5–5.0)
Alkaline Phosphatase: 62 U/L (ref 38–126)
Anion gap: 10 (ref 5–15)
BUN: 16 mg/dL (ref 6–20)
CO2: 22 mmol/L (ref 22–32)
Calcium: 8.9 mg/dL (ref 8.9–10.3)
Chloride: 103 mmol/L (ref 98–111)
Creatinine, Ser: 0.89 mg/dL (ref 0.61–1.24)
GFR calc Af Amer: >60 mL/min (ref >60)
GFR calc non Af Amer: >60 mL/min (ref >60)
Glucose, Bld: 97 mg/dL (ref 70–99)
Potassium: 4.3 mmol/L (ref 3.5–5.1)
Sodium: 135 mmol/L (ref 135–145)
Total Bilirubin: 0.5 mg/dL (ref 0.3–1.2)
Total Protein: 7.2 g/dL (ref 6.5–8.1)

## 2019-03-01 LAB — CBC WITH DIFFERENTIAL/PLATELET
Abs Immature Granulocytes: 0.02 10*3/uL (ref 0.00–0.07)
Basophils Absolute: 0 10*3/uL (ref 0.0–0.1)
Basophils Relative: 1 %
Eosinophils Absolute: 0.2 10*3/uL (ref 0.0–0.5)
Eosinophils Relative: 4 %
HCT: 48.3 % (ref 39.0–52.0)
Hemoglobin: 16.2 g/dL (ref 13.0–17.0)
Immature Granulocytes: 0 %
Lymphocytes Relative: 29 %
Lymphs Abs: 1.7 10*3/uL (ref 0.7–4.0)
MCH: 32.8 pg (ref 26.0–34.0)
MCHC: 33.5 g/dL (ref 30.0–36.0)
MCV: 97.8 fL (ref 80.0–100.0)
Monocytes Absolute: 0.5 10*3/uL (ref 0.1–1.0)
Monocytes Relative: 8 %
Neutro Abs: 3.4 10*3/uL (ref 1.7–7.7)
Neutrophils Relative %: 58 %
Platelets: 171 10*3/uL (ref 150–400)
RBC: 4.94 MIL/uL (ref 4.22–5.81)
RDW: 12.6 % (ref 11.5–15.5)
WBC: 5.8 10*3/uL (ref 4.0–10.5)
nRBC: 0 % (ref 0.0–0.2)

## 2019-03-01 LAB — MAGNESIUM: Magnesium: 2 mg/dL (ref 1.7–2.4)

## 2019-03-01 LAB — LIPASE, BLOOD: Lipase: 33 U/L (ref 11–51)

## 2019-03-01 MED ORDER — SODIUM CHLORIDE 0.9 % IV BOLUS
1000.0000 mL | Freq: Once | INTRAVENOUS | Status: AC
  Administered 2019-03-01: 16:00:00 1000 mL via INTRAVENOUS

## 2019-03-01 MED ORDER — PANTOPRAZOLE SODIUM 20 MG PO TBEC: 20.0000 mg | DELAYED_RELEASE_TABLET | Freq: Every day | ORAL | 0 refills | Status: DC

## 2019-03-01 MED ORDER — ALUM & MAG HYDROXIDE-SIMETH 200-200-20 MG/5ML PO SUSP
30.0000 mL | Freq: Once | ORAL | Status: AC
  Administered 2019-03-01: 30 mL via ORAL
  Filled 2019-03-01: qty 30

## 2019-03-01 MED FILL — PANTOPRAZOLE SOD DR 20 MG T: 20 | 30 days supply | Qty: 30 | Fill #0

## 2019-03-01 NOTE — ED Notes (Signed)
ED Provider at bedside. 

## 2019-03-01 NOTE — ED Notes (Signed)
C/o upper abd pain w n/v/d  States has blood in stool  Also  Head ache  Has been seen here and at md office for same
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
## 2019-03-01 NOTE — ED Triage Notes (Signed)
C/o abd pain n/v/d-sent by PCP per pt-NAD-steady gait
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
## 2019-03-01 NOTE — Progress Notes (Signed)
I have discussed the procedure for the virtual visit with the patient who has given consent to proceed with assessment and treatment.   Nolyn Swab S Kebron Pulse, CMA     

## 2019-03-01 NOTE — ED Notes (Signed)
Pt talking on phone without issue or difficulty, can be heard from room into hall

## 2019-03-02 ENCOUNTER — Other Ambulatory Visit: Payer: Self-pay | Admitting: Physician Assistant

## 2019-03-02 ENCOUNTER — Telehealth: Payer: Self-pay | Admitting: Emergency Medicine

## 2019-03-02 DIAGNOSIS — K2900 Acute gastritis without bleeding: Secondary | ICD-10-CM

## 2019-03-02 DIAGNOSIS — R1013 Epigastric pain: Secondary | ICD-10-CM

## 2019-03-02 MED ORDER — ESCITALOPRAM OXALATE 20 MG PO TABS: 20.0000 mg | ORAL_TABLET | Freq: Every day | ORAL | 1 refills | Status: DC

## 2019-03-02 MED FILL — ESCITALOPRAM 20 MG TABLET: 20 | 30 days supply | Qty: 30 | Fill #0

## 2019-03-02 NOTE — Telephone Encounter (Signed)
LMOVM advising patient to call back of his blood pressures, if he started the Protonix and letting him know a referral to GI has been placed.    PCP: Please call patient to verify he has picked up and started taking his Protonix. Also see how BP are running today as we may need to temporarily start a medication. I have placed a referral to GI for further assessment of his epigastric symptoms.

## 2019-03-02 NOTE — Telephone Encounter (Signed)
Increase Lexapro to 20 mg daily. Ok to send in new Rx 20 with 1 refill. Will see at follow-up and discuss need for BP medication if BP remains this high.

## 2019-03-02 NOTE — Telephone Encounter (Signed)
Spoke with patient. He is okay with this increase.  I have sent in the medication to the pharmacy that was requested.

## 2019-03-02 NOTE — Addendum Note (Signed)
Addended by: Katina Dung on: 03/02/2019 02:22 PM   Modules accepted: Orders

## 2019-03-02 NOTE — Telephone Encounter (Signed)
LM for patient to call so that I can give patient information

## 2019-03-02 NOTE — Telephone Encounter (Signed)
I question BP cuff accuracy giving his BP at ER visits have not been this high, although still high.  Keep hydrated and rest. Keep up with the Protonix for reflux to help calm things down.  Thankfully his labs have been stable. The BP I think is related to anxiety level. Need for him to try to remain calm.  Ok to start him on Amlodipine 5 mg daily to help lower his BP.   Want him to come in for office visit Friday for reassessment. Will recheck BP and do exam here at that time.  ER for any acute worsening symptoms.

## 2019-03-02 NOTE — Telephone Encounter (Signed)
Current BP  188/120 HR 96  He has picked up and started the protonix.  He has been vomiting and diarrhea today. He states there is still blood in his stool.   He received a call from GI and they have him scheduled for a virtual visit is next Wednesday.

## 2019-03-02 NOTE — Telephone Encounter (Signed)
Spoke with patient to give notes from PCP.  Patient stated verbal understanding and in office visit was made for Friday at 8:40am.   Patient declines BP medication at this time  - said he does not want to go on something that he will be on for life, but he said he would discuss this at his OV on Friday. Girlfriend took the phone and said that she feels like it is more anxiety related and wanted to know if it could be considered to increase his anxiety medication.   Advised that I would let PCP know, but that he might choose to discuss medication changes at the office visit on Friday. Patient was advised to rest, hydrate and try to remain calm and relaxed.   Stated verbal understanding.

## 2019-03-04 ENCOUNTER — Emergency Department (HOSPITAL_COMMUNITY)
Admission: EM | Admit: 2019-03-04 | Discharge: 2019-03-04 | Disposition: A | Discharge status: Home / Self Care | Payer: Medicaid Other | Attending: Emergency Medicine | Admitting: Emergency Medicine

## 2019-03-04 ENCOUNTER — Emergency Department (HOSPITAL_COMMUNITY): Payer: Medicaid Other

## 2019-03-04 ENCOUNTER — Telehealth: Payer: Self-pay | Admitting: Gastroenterology

## 2019-03-04 ENCOUNTER — Encounter (HOSPITAL_COMMUNITY): Payer: Self-pay | Admitting: Emergency Medicine

## 2019-03-04 ENCOUNTER — Other Ambulatory Visit: Payer: Self-pay

## 2019-03-04 DIAGNOSIS — R197 Diarrhea, unspecified: Secondary | ICD-10-CM | POA: Insufficient documentation

## 2019-03-04 DIAGNOSIS — Z79899 Other long term (current) drug therapy: Secondary | ICD-10-CM | POA: Diagnosis not present

## 2019-03-04 DIAGNOSIS — R1110 Vomiting, unspecified: Secondary | ICD-10-CM | POA: Diagnosis not present

## 2019-03-04 DIAGNOSIS — Z85528 Personal history of other malignant neoplasm of kidney: Secondary | ICD-10-CM | POA: Insufficient documentation

## 2019-03-04 DIAGNOSIS — R1013 Epigastric pain: Secondary | ICD-10-CM

## 2019-03-04 DIAGNOSIS — F17210 Nicotine dependence, cigarettes, uncomplicated: Secondary | ICD-10-CM | POA: Diagnosis not present

## 2019-03-04 DIAGNOSIS — R109 Unspecified abdominal pain: Secondary | ICD-10-CM

## 2019-03-04 DIAGNOSIS — R42 Dizziness and giddiness: Secondary | ICD-10-CM | POA: Insufficient documentation

## 2019-03-04 LAB — COMPREHENSIVE METABOLIC PANEL
ALT: 28 U/L (ref 0–44)
AST: 29 U/L (ref 15–41)
Albumin: 4 g/dL (ref 3.5–5.0)
Alkaline Phosphatase: 58 U/L (ref 38–126)
Anion gap: 8 (ref 5–15)
BUN: 12 mg/dL (ref 6–20)
CO2: 27 mmol/L (ref 22–32)
Calcium: 9.4 mg/dL (ref 8.9–10.3)
Chloride: 102 mmol/L (ref 98–111)
Creatinine, Ser: 1.15 mg/dL (ref 0.61–1.24)
GFR calc Af Amer: >60 mL/min (ref >60)
GFR calc non Af Amer: >60 mL/min (ref >60)
Glucose, Bld: 114 mg/dL — ABNORMAL HIGH (ref 70–99)
Potassium: 3.8 mmol/L (ref 3.5–5.1)
Sodium: 137 mmol/L (ref 135–145)
Total Bilirubin: 0.6 mg/dL (ref 0.3–1.2)
Total Protein: 6.7 g/dL (ref 6.5–8.1)

## 2019-03-04 LAB — CBC
HCT: 43.5 % (ref 39.0–52.0)
Hemoglobin: 15.2 g/dL (ref 13.0–17.0)
MCH: 33.3 pg (ref 26.0–34.0)
MCHC: 34.9 g/dL (ref 30.0–36.0)
MCV: 95.4 fL (ref 80.0–100.0)
Platelets: 189 10*3/uL (ref 150–400)
RBC: 4.56 MIL/uL (ref 4.22–5.81)
RDW: 12.6 % (ref 11.5–15.5)
WBC: 6.2 10*3/uL (ref 4.0–10.5)
nRBC: 0 % (ref 0.0–0.2)

## 2019-03-04 LAB — URINALYSIS, ROUTINE W REFLEX MICROSCOPIC
Bilirubin Urine: NEGATIVE
Glucose, UA: NEGATIVE mg/dL
Hgb urine dipstick: NEGATIVE
Ketones, ur: NEGATIVE mg/dL
Leukocytes,Ua: NEGATIVE
Nitrite: NEGATIVE
Protein, ur: NEGATIVE mg/dL
Specific Gravity, Urine: 1.002 — ABNORMAL LOW (ref 1.005–1.030)
pH: 6 (ref 5.0–8.0)

## 2019-03-04 LAB — TROPONIN I: Troponin I: <0.03 ng/mL (ref <0.03)

## 2019-03-04 LAB — LIPASE, BLOOD: Lipase: 29 U/L (ref 11–51)

## 2019-03-04 MED ORDER — FENTANYL CITRATE (PF) 100 MCG/2ML IJ SOLN
50.0000 ug | Freq: Once | INTRAMUSCULAR | Status: AC
  Administered 2019-03-04: 21:00:00 50 ug via INTRAVENOUS
  Filled 2019-03-04: qty 2

## 2019-03-04 MED ORDER — LORAZEPAM 2 MG/ML IJ SOLN
1.0000 mg | Freq: Once | INTRAMUSCULAR | Status: AC
  Administered 2019-03-04: 16:00:00 1 mg via INTRAVENOUS
  Filled 2019-03-04: qty 1

## 2019-03-04 MED ORDER — IOHEXOL 300 MG/ML  SOLN
100.0000 mL | Freq: Once | INTRAMUSCULAR | Status: AC | PRN
  Administered 2019-03-04: 100 mL via INTRAVENOUS

## 2019-03-04 MED ORDER — SODIUM CHLORIDE 0.9% FLUSH: 3.0000 mL | Freq: Once | INTRAVENOUS | Status: DC

## 2019-03-04 MED ORDER — LORAZEPAM 2 MG/ML IJ SOLN
1.0000 mg | Freq: Once | INTRAMUSCULAR | Status: AC
  Administered 2019-03-04: 21:00:00 1 mg via INTRAVENOUS
  Filled 2019-03-04: qty 1

## 2019-03-04 MED ORDER — SODIUM CHLORIDE 0.9 % IV BOLUS
1000.0000 mL | Freq: Once | INTRAVENOUS | Status: AC
  Administered 2019-03-04: 16:00:00 1000 mL via INTRAVENOUS

## 2019-03-04 MED ORDER — SUCRALFATE 1 GM/10ML PO SUSP: 1.0000 g | Freq: Three times a day (TID) | ORAL | 0 refills | Status: DC

## 2019-03-04 MED ORDER — FENTANYL CITRATE (PF) 100 MCG/2ML IJ SOLN
50.0000 ug | Freq: Once | INTRAMUSCULAR | Status: AC
  Administered 2019-03-04: 18:00:00 50 ug via INTRAVENOUS
  Filled 2019-03-04: qty 2
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
## 2019-03-04 NOTE — Telephone Encounter (Signed)
PCP [facility] called said that the patient is scheduled with them tomorrow to PCP and with [place] on next week 03-10-19 but the patient has been to the ER 2x this week and has been sent home to follow up with PCP and GI which he is doing. PCP is worried because the patient is having blood in stool feels very weak, nausea dizzy and confused, bld pressure is also high. She would like to know if we can work him in today to see someone else. She would like a call back her # is [phone]

## 2019-03-04 NOTE — Telephone Encounter (Signed)
Dr Ardis Hughs the pt was last seen 2014 for rectal bleeding and had a colon.  Per your note he was due in 3 years for a repeat colon.  His PCP is now calling today with the pt scheduled to see him tomorrow for rectal bleeding, dizziness, and nausea as well confusion and hypertension.  Do you want him seen in the ED?  He has been twice this week and sent home.  His labs from 5/11 are completley normal.  They are requesting an appt today with [place]. Please advise

## 2019-03-04 NOTE — Telephone Encounter (Signed)
I am seeing this for the first time just now.  See phone note from Dr. Bryan Lemma from this evening.

## 2019-03-04 NOTE — Discharge Instructions (Signed)
Continue your current meds.   Add carafate to help your stomach   See your doctor  Consider endoscopy with your GI doctor   Return to ER if you have worse abdominal pain, vomiting, dehydration, fever

## 2019-03-04 NOTE — ED Notes (Signed)
Patient verbalizes understanding of discharge instructions. Opportunity for questioning and answers were provided. Armband removed by staff, pt discharged from ED ambulatory.   

## 2019-03-04 NOTE — ED Notes (Signed)
MD Darl Householder informed that patient wants to speak with him. MD will speak with patient shortly.

## 2019-03-04 NOTE — ED Triage Notes (Signed)
Pt reports he went to [facility] 2 x this week, was tested for Covid and it was negative, reports he's had CP, fatigue, blood in stool, h/a, dizziness, epigastric abdominal pain x 1 week.

## 2019-03-04 NOTE — Telephone Encounter (Signed)
Received page from patient. Called back. He is currently in the [facility] ER with c/o abdominal pain, nausea, non-bloody emesis, diarrhea, and BRBPR. Has been seen in the ER several times this week, with a negative extensive evaluation, to include CBC, CMP, lipase, trop, UA, COVID-19, CT abd/pelvis, CTPA, CT Head, CXR. No UDS done.   Certainly anxious speech during conversation. Mostly endorsing MEG/subcostal pain. Explained that his evaluation has been unrevealing to date, which should be somewhat reassuring. If he is admitted, we can plan to see as an inpatient in the AM. If he is discharged from the ER, advised him to f/u with our clinic. He is already scheduled to see Dr. Ardis Hughs on 5/20, but may be able to be seen by [facility] tomorrow pending space/availability and dispo plan.   Forwarding to Dr. Ardis Hughs, Dr. Silverio Decamp (D[facility] on 03/05/19), and Dr. Rush Landmark (inpatient GI this week) for visibility and awareness.

## 2019-03-05 ENCOUNTER — Encounter: Payer: Self-pay | Admitting: Physician Assistant

## 2019-03-05 ENCOUNTER — Ambulatory Visit (INDEPENDENT_AMBULATORY_CARE_PROVIDER_SITE_OTHER): Payer: Self-pay | Admitting: Physician Assistant

## 2019-03-05 ENCOUNTER — Ambulatory Visit: Payer: Self-pay | Admitting: Physician Assistant

## 2019-03-05 VITALS — BP 134/86 | HR 92 | Temp 98.7°F | Resp 16 | Ht 73.0 in | Wt 285.0 lb

## 2019-03-05 DIAGNOSIS — F329 Major depressive disorder, single episode, unspecified: Secondary | ICD-10-CM

## 2019-03-05 DIAGNOSIS — K2990 Gastroduodenitis, unspecified, without bleeding: Secondary | ICD-10-CM

## 2019-03-05 DIAGNOSIS — F32A Depression, unspecified: Secondary | ICD-10-CM

## 2019-03-05 DIAGNOSIS — F419 Anxiety disorder, unspecified: Secondary | ICD-10-CM

## 2019-03-05 DIAGNOSIS — R197 Diarrhea, unspecified: Secondary | ICD-10-CM

## 2019-03-05 LAB — H. PYLORI ANTIBODY, IGG: H Pylori IgG: NEGATIVE

## 2019-03-05 MED ORDER — ALPRAZOLAM 1 MG PO TABS: 0.5000 mg | ORAL_TABLET | Freq: Three times a day (TID) | ORAL | 0 refills | Status: DC | PRN

## 2019-03-05 MED ORDER — ONDANSETRON HCL 4 MG PO TABS: 4.0000 mg | ORAL_TABLET | Freq: Three times a day (TID) | ORAL | 0 refills | Status: DC | PRN

## 2019-03-05 MED FILL — ONDANSETRON HCL 4 MG TABLET: 4 | 6 days supply | Qty: 20 | Fill #0

## 2019-03-05 MED FILL — ALPRAZolam 1 MG TABS: 1 | 30 days supply | Qty: 90 | Fill #0

## 2019-03-05 NOTE — Patient Instructions (Signed)
Please go to the lab today for blood work.  I will call you with your results. We will alter treatment regimen(s) if indicated by your results.   Please continue the Protonix. Start the Carafate taking as directed. Start the Zofran as directed for nausea when needed. Follow the dietary recommendations below.  Follow-up with the GI specialist as scheduled.   Keep up with the Lexapro daily. I have temporarily increased the dose and frequency of your Alprazolam.      Gastroesophageal Reflux Disease, Adult Gastroesophageal reflux (GER) happens when acid from the stomach flows up into the tube that connects the mouth and the stomach (esophagus). Normally, food travels down the esophagus and stays in the stomach to be digested. With GER, food and stomach acid sometimes move back up into the esophagus. You may have a disease called gastroesophageal reflux disease (GERD) if the reflux:  Happens often.  Causes frequent or very bad symptoms.  Causes problems such as damage to the esophagus. When this happens, the esophagus becomes sore and swollen (inflamed). Over time, GERD can make small holes (ulcers) in the lining of the esophagus. What are the causes? This condition is caused by a problem with the muscle between the esophagus and the stomach. When this muscle is weak or not normal, it does not close properly to keep food and acid from coming back up from the stomach. The muscle can be weak because of:  Tobacco use.  Pregnancy.  Having a certain type of hernia (hiatal hernia).  Alcohol use.  Certain foods and drinks, such as coffee, chocolate, onions, and peppermint. What increases the risk? You are more likely to develop this condition if you:  Are overweight.  Have a disease that affects your connective tissue.  Use NSAID medicines. What are the signs or symptoms? Symptoms of this condition include:  Heartburn.  Difficult or painful swallowing.  The feeling of having  a lump in the throat.  A bitter taste in the mouth.  Bad breath.  Having a lot of saliva.  Having an upset or bloated stomach.  Belching.  Chest pain. Different conditions can cause chest pain. Make sure you see your doctor if you have chest pain.  Shortness of breath or noisy breathing (wheezing).  Ongoing (chronic) cough or a cough at night.  Wearing away of the surface of teeth (tooth enamel).  Weight loss. How is this treated? Treatment will depend on how bad your symptoms are. Your doctor may suggest:  Changes to your diet.  Medicine.  Surgery. Follow these instructions at home: Eating and drinking   Follow a diet as told by your doctor. You may need to avoid foods and drinks such as: ? Coffee and tea (with or without caffeine). ? Drinks that contain alcohol. ? Energy drinks and sports drinks. ? Bubbly (carbonated) drinks or sodas. ? Chocolate and cocoa. ? Peppermint and mint flavorings. ? Garlic and onions. ? Horseradish. ? Spicy and acidic foods. These include peppers, chili powder, curry powder, vinegar, hot sauces, and BBQ sauce. ? Citrus fruit juices and citrus fruits, such as oranges, lemons, and limes. ? Tomato-based foods. These include red sauce, chili, salsa, and pizza with red sauce. ? Fried and fatty foods. These include donuts, french fries, potato chips, and high-fat dressings. ? High-fat meats. These include hot dogs, rib eye steak, sausage, ham, and bacon. ? High-fat dairy items, such as whole milk, butter, and cream cheese.  Eat small meals often. Avoid eating large meals.  Avoid  drinking large amounts of liquid with your meals.  Avoid eating meals during the 2-3 hours before bedtime.  Avoid lying down right after you eat.  Do not exercise right after you eat. Lifestyle   Do not use any products that contain nicotine or tobacco. These include cigarettes, e-cigarettes, and chewing tobacco. If you need help quitting, ask your doctor.   Try to lower your stress. If you need help doing this, ask your doctor.  If you are overweight, lose an amount of weight that is healthy for you. Ask your doctor about a safe weight loss goal. General instructions  Pay attention to any changes in your symptoms.  Take over-the-counter and prescription medicines only as told by your doctor. Do not take aspirin, ibuprofen, or other NSAIDs unless your doctor says it is okay.  Wear loose clothes. Do not wear anything tight around your waist.  Raise (elevate) the head of your bed about 6 inches (15 cm).  Avoid bending over if this makes your symptoms worse.  Keep all follow-up visits as told by your doctor. This is important. Contact a doctor if:  You have new symptoms.  You lose weight and you do not know why.  You have trouble swallowing or it hurts to swallow.  You have wheezing or a cough that keeps happening.  Your symptoms do not get better with treatment.  You have a hoarse voice. Get help right away if:  You have pain in your arms, neck, jaw, teeth, or back.  You feel sweaty, dizzy, or light-headed.  You have chest pain or shortness of breath.  You throw up (vomit) and your throw-up looks like blood or coffee grounds.  You pass out (faint).  Your poop (stool) is bloody or black.  You cannot swallow, drink, or eat. Summary  If a person has gastroesophageal reflux disease (GERD), food and stomach acid move back up into the esophagus and cause symptoms or problems such as damage to the esophagus.  Treatment will depend on how bad your symptoms are.  Follow a diet as told by your doctor.  Take all medicines only as told by your doctor. This information is not intended to replace advice given to you by your health care provider. Make sure you discuss any questions you have with your health care provider. Document Released: 03/25/2008 Document Revised: 04/15/2018 Document Reviewed: 04/15/2018 Elsevier Interactive  Patient Education  2019 [employer].

## 2019-03-05 NOTE — Progress Notes (Signed)
Patient presents to clinic today for ER follow-up. Patient initially presented to ER for this on 03/01/2019. ER workup at that time included unremarkable labs. Was given GI cocktail and IV fluids with improvement in symptoms. Was given PPI for gastritis to start taking but did not. Patient returned to ER on 03/04/2019 with worsening symptoms. At this time workup included serial EKG (no acute changes from prior readings), labs (negative troponin, negative CBC, unremarkable CMP), CXR (negative for acute cardiopulmonary findings), CT Head (negative) and CT Abdomen/Pelvis (sigmoid diverticulosis without diverticulitis. No other abnormal findings). Carafate was added to his PPI and he was instructed to follow-up with GI for likely stress-ulcer.   Since discharge yesterday patient notes he is taking PPI but has not picked up or started taking the carafate. Notes epigastric and LUQ pain still with ongoing loose stool. Denies melena, hematochezia or tenesmus. Denies fever, chills. Has noted fluctuating BP, mainly with level of pain. Is taking increased dose of LExapro and tolerating well. HAs been getting help with anxiety from the Alprazolam. Would like to discuss dose. Denies any new or worsening symptoms.    Past Medical History:  Diagnosis Date  . Anxiety and depression 08/01/2013  . Cancer ([place])   . Cellulitis    left leg and stomach  . Chicken pox   . Chronic pain   . Diarrhea 08/01/2013  . History of kidney cancer   . Hx of vasectomy   . Renal cell carcinoma ([place]) 06/01/2013    Current Outpatient Medications on File Prior to Visit  Medication Sig Dispense Refill  . Acetaminophen (TYLENOL ARTHRITIS EXT RELIEF PO) Take 1-2 tablets by mouth every 8 (eight) hours as needed.    Marland Kitchen albuterol (VENTOLIN HFA) 108 (90 Base) MCG/ACT inhaler Inhale 1-2 puffs into the lungs every 6 (six) hours as needed for wheezing or shortness of breath. 1 Inhaler 0  . Ascorbic Acid (VITAMIN C) 1000 MG tablet Take 1,000  mg by mouth daily.    . carisoprodol (SOMA) 350 MG tablet Take 1 tablet (350 mg total) by mouth 3 (three) times daily. 90 tablet 0  . diphenhydrAMINE (BENADRYL) 25 MG tablet Take 25 mg by mouth every 6 (six) hours as needed.    Marland Kitchen escitalopram (LEXAPRO) 20 MG tablet Take 1 tablet (20 mg total) by mouth daily. 30 tablet 1  . famotidine (PEPCID) 20 MG tablet Take 1 tablet (20 mg total) by mouth 2 (two) times daily. 30 tablet 0  . gabapentin (NEURONTIN) 300 MG capsule Take 3 capsules in morning, 3 capsules in afternoon, 3 capsules at bedtime. 270 capsule 1  . Multiple Vitamins-Minerals (MULTIVITAMIN ADULTS 50+ PO) Take 1 tablet by mouth daily.    Marland Kitchen oxyCODONE-acetaminophen (PERCOCET/ROXICET) 5-325 MG tablet Take 2 tablets by mouth every 6 (six) hours as needed for severe pain. 16 tablet 0  . pantoprazole (PROTONIX) 20 MG tablet Take 1 tablet (20 mg total) by mouth daily. 30 tablet 0  . sucralfate (CARAFATE) 1 GM/10ML suspension Take 10 mLs (1 g total) by mouth 4 (four) times daily -  with meals and at bedtime. 420 mL 0  . UNABLE TO FIND Med Name: Nugenix     No current facility-administered medications on file prior to visit.     Allergies  Allergen Reactions  . Bee Venom Shortness Of Breath and Swelling    Arm swells  . Hornet Venom Shortness Of Breath and Swelling    Arm swells    Family History  Problem Relation  Age of Onset  . Cirrhosis Father   . Colitis Father   . Heart disease Father   . Asthma Father   . Other Father        Chemical Imbalance  . Heart attack Other        Paternal Grandparents  . Stroke Other        Paternal Grandparents  . Prostate cancer Paternal Grandfather   . Diabetes Maternal Grandfather   . Alcohol abuse Mother   . Other Brother        Intestinal Fissure  . Colon polyps Sister        intestinal problems  . Asthma Son   . Other Brother        Chemical Imbalance    Social History   Socioeconomic History  . Marital status: Divorced    Spouse  name: Not on file  . Number of children: 3  . Years of education: Not on file  . Highest education level: Not on file  Occupational History  . Not on file  Social Needs  . Financial resource strain: Not on file  . Food insecurity:    Worry: Not on file    Inability: Not on file  . Transportation needs:    Medical: Not on file    Non-medical: Not on file  Tobacco Use  . Smoking status: Current Every Day Smoker    Packs/day: 1.00    Years: 24.00    Pack years: 24.00    Types: Cigarettes  . Smokeless tobacco: Never Used  Substance and Sexual Activity  . Alcohol use: Yes    Alcohol/week: 0.0 standard drinks    Comment: occ  . Drug use: No  . Sexual activity: Not on file  Lifestyle  . Physical activity:    Days per week: Not on file    Minutes per session: Not on file  . Stress: Not on file  Relationships  . Social connections:    Talks on phone: Not on file    Gets together: Not on file    Attends religious service: Not on file    Active member of club or organization: Not on file    Attends meetings of clubs or organizations: Not on file    Relationship status: Not on file  Other Topics Concern  . Not on file  Social History Narrative   ** Merged History Encounter **       Review of Systems - See HPI.  All other ROS are negative.  BP 134/86   Pulse 92   Temp 98.7 F (37.1 C) (Oral)   Resp 16   Ht 6\' 1"  (1.854 m)   Wt 285 lb (129.3 kg)   SpO2 98%   BMI 37.60 kg/m   Physical Exam Vitals signs reviewed.  Constitutional:      Appearance: He is well-developed.  HENT:     Head: Normocephalic and atraumatic.     Mouth/Throat:     Mouth: Mucous membranes are moist.  Cardiovascular:     Rate and Rhythm: Normal rate and regular rhythm.     Heart sounds: Normal heart sounds.  Pulmonary:     Effort: Pulmonary effort is normal.     Breath sounds: Normal breath sounds.  Abdominal:     General: Abdomen is flat. Bowel sounds are normal.     Tenderness: There is  abdominal tenderness in the epigastric area and left upper quadrant. There is no right CVA tenderness, left CVA tenderness,  guarding or rebound.     Hernia: No hernia is present.  Neurological:     General: No focal deficit present.     Mental Status: He is alert.  Psychiatric:        Mood and Affect: Mood is anxious. Mood is not depressed.    Recent Results (from the past 2160 hour(s))  Pain Mgmt, Profile 8 w/Conf, U     Status: Abnormal   Collection Time: 12/22/18  4:09 PM  Result Value Ref Range   Creatinine 37.9 > or = 20. mg/dL   pH 6.47 4.5 - 9.0   Oxidant NEGATIVE <200 mcg/mL   Amphetamines NEGATIVE <500 ng/mL   medMATCH Amphetamines CONSISTENT    Benzodiazepines NEGATIVE <100 ng/mL   medMATCH Benzodiazepines CONSISTENT    Marijuana Metabolite POSITIVE (A) <20 ng/mL   Marijuana Metabolite 19 (H) <5 ng/mL    Comment: See Note 1   medMATCH Marijuana Metab INCONSISTENT    Cocaine Metabolite NEGATIVE <150 ng/mL   medMATCH Cocaine Metab CONSISTENT    Opiates NEGATIVE <100 ng/mL   medMATCH Opiates CONSISTENT    Oxycodone NEGATIVE <100 ng/mL   medMATCH Oxycodone CONSISTENT    Buprenorphine, Urine NEGATIVE <5 ng/mL   medMATCH Buprenorphine CONSISTENT    MDMA NEGATIVE <500 ng/mL   [facility] MDMA CONSISTENT    Alcohol Metabolites NEGATIVE <500 ng/mL   medMATCH Alcohol Metab CONSISTENT    6 Acetylmorphine NEGATIVE <10 ng/mL   medMATCH 6 Acetylmorphine CONSISTENT     Comment: See Note 2 See Note 2 See Note 2 See Note 2 See Note 2 . Note 1 . This test was developed and its analytical performance  characteristics have been determined by [employer]. It has not been cleared or approved by the FDA. This assay has been validated pursuant to the CLIA  regulations and is used for clinical purposes. . Note 2 This drug testing is for medical treatment only.   Analysis was performed as non-forensic testing and  these results should be used only by healthcare  providers  to render diagnosis or treatment, or to  monitor progress of medical conditions. Hazel Sams comments are:  - present when drug test results may be the result of     metabolism of one or more drugs or when results are     inconsistent with prescribed medication(s) listed.  - may be blank when drug results are consistent with     prescribed medication(s) listed. . For assistance with interpreting these drug results,  please contact a [employer] [facility]  Spec ialist: [phone] [place] [phone]), M-F,  8am-6pm EST.   CBC with Differential     Status: None   Collection Time: 12/26/18 12:29 AM  Result Value Ref Range   WBC 6.3 4.0 - 10.5 K/uL   RBC 4.93 4.22 - 5.81 MIL/uL   Hemoglobin 16.1 13.0 - 17.0 g/dL   HCT 47.2 39.0 - 52.0 %   MCV 95.7 80.0 - 100.0 fL   MCH 32.7 26.0 - 34.0 pg   MCHC 34.1 30.0 - 36.0 g/dL   RDW 12.6 11.5 - 15.5 %   Platelets 210 150 - 400 K/uL   nRBC 0.0 0.0 - 0.2 %   Neutrophils Relative % 49 %   Neutro Abs 3.1 1.7 - 7.7 K/uL   Lymphocytes Relative 33 %   Lymphs Abs 2.1 0.7 - 4.0 K/uL   Monocytes Relative 10 %   Monocytes Absolute 0.6 0.1 - 1.0 K/uL   Eosinophils  Relative 7 %   Eosinophils Absolute 0.4 0.0 - 0.5 K/uL   Basophils Relative 1 %   Basophils Absolute 0.0 0.0 - 0.1 K/uL   Immature Granulocytes 0 %   Abs Immature Granulocytes 0.01 0.00 - 0.07 K/uL    Comment: Performed at [facility], [address] [place], [place] [address]  Urinalysis, Routine w reflex microscopic     Status: None   Collection Time: 12/26/18 12:29 AM  Result Value Ref Range   Color, Urine YELLOW YELLOW   APPearance CLEAR CLEAR   Specific Gravity, Urine 1.010 1.005 - 1.030   pH 6.0 5.0 - 8.0   Glucose, UA NEGATIVE NEGATIVE mg/dL   Hgb urine dipstick NEGATIVE NEGATIVE   Bilirubin Urine NEGATIVE NEGATIVE   Ketones, ur NEGATIVE NEGATIVE mg/dL   Protein, ur NEGATIVE NEGATIVE mg/dL   Nitrite NEGATIVE NEGATIVE   Leukocytes,Ua NEGATIVE NEGATIVE     Comment: Microscopic not done on urines with negative protein, blood, leukocytes, nitrite, or glucose < 500 mg/dL. Performed at [facility], [address] [place], [place] [address]   Comprehensive metabolic panel     Status: Abnormal   Collection Time: 12/26/18  1:12 AM  Result Value Ref Range   Sodium 134 (L) 135 - 145 mmol/L   Potassium 3.8 3.5 - 5.1 mmol/L   Chloride 104 98 - 111 mmol/L   CO2 23 22 - 32 mmol/L   Glucose, Bld 113 (H) 70 - 99 mg/dL   BUN 16 6 - 20 mg/dL   Creatinine, Ser 0.93 0.61 - 1.24 mg/dL   Calcium 9.2 8.9 - 10.3 mg/dL   Total Protein 7.2 6.5 - 8.1 g/dL   Albumin 4.1 3.5 - 5.0 g/dL   AST 24 15 - 41 U/L   ALT 25 0 - 44 U/L   Alkaline Phosphatase 62 38 - 126 U/L   Total Bilirubin 0.4 0.3 - 1.2 mg/dL   GFR calc non Af Amer >60 >60 mL/min   GFR calc Af Amer >60 >60 mL/min   Anion gap 7 5 - 15    Comment: Performed at [facility], [address] [place], [place] [address]  CBC with Differential     Status: None   Collection Time: 02/25/19  4:36 PM  Result Value Ref Range   WBC 8.1 4.0 - 10.5 K/uL   RBC 4.65 4.22 - 5.81 MIL/uL   Hemoglobin 15.2 13.0 - 17.0 g/dL   HCT 44.5 39.0 - 52.0 %   MCV 95.7 80.0 - 100.0 fL   MCH 32.7 26.0 - 34.0 pg   MCHC 34.2 30.0 - 36.0 g/dL   RDW 12.6 11.5 - 15.5 %   Platelets 189 150 - 400 K/uL   nRBC 0.0 0.0 - 0.2 %   Neutrophils Relative % 68 %   Neutro Abs 5.6 1.7 - 7.7 K/uL   Lymphocytes Relative 20 %   Lymphs Abs 1.6 0.7 - 4.0 K/uL   Monocytes Relative 8 %   Monocytes Absolute 0.6 0.1 - 1.0 K/uL   Eosinophils Relative 2 %   Eosinophils Absolute 0.2 0.0 - 0.5 K/uL   Basophils Relative 1 %   Basophils Absolute 0.0 0.0 - 0.1 K/uL   Immature Granulocytes 1 %   Abs Immature Granulocytes 0.04 0.00 - 0.07 K/uL    Comment: Performed at [facility], [address] [place], [place] [address]  Comprehensive metabolic panel  Status: Abnormal   Collection Time: 02/25/19  4:36 PM   Result Value Ref Range   Sodium 138 135 - 145 mmol/L   Potassium 3.6 3.5 - 5.1 mmol/L   Chloride 103 98 - 111 mmol/L   CO2 25 22 - 32 mmol/L   Glucose, Bld 148 (H) 70 - 99 mg/dL   BUN 13 6 - 20 mg/dL   Creatinine, Ser 1.02 0.61 - 1.24 mg/dL   Calcium 9.2 8.9 - 10.3 mg/dL   Total Protein 7.1 6.5 - 8.1 g/dL   Albumin 4.4 3.5 - 5.0 g/dL   AST 31 15 - 41 U/L   ALT 35 0 - 44 U/L   Alkaline Phosphatase 60 38 - 126 U/L   Total Bilirubin 0.5 0.3 - 1.2 mg/dL   GFR calc non Af Amer >60 >60 mL/min   GFR calc Af Amer >60 >60 mL/min   Anion gap 10 5 - 15    Comment: Performed at [facility], [address] [place], [place] [address]  Lipase, blood     Status: None   Collection Time: 02/25/19  4:36 PM  Result Value Ref Range   Lipase 34 11 - 51 U/L    Comment: Performed at [facility], [address] [place], [place] [address]  Troponin I - Once     Status: None   Collection Time: 02/25/19  4:36 PM  Result Value Ref Range   Troponin I <0.03 <0.03 ng/mL    Comment: Performed at [facility], [address] [place], [place] [address]  D-dimer, quantitative (not at [facility])     Status: Abnormal   Collection Time: 02/25/19  4:36 PM  Result Value Ref Range   D-Dimer, Quant 1.93 (H) 0.00 - 0.50 ug/mL-FEU    Comment: (NOTE) At the manufacturer cut-off of 0.50 ug/mL FEU, this assay has been documented to exclude PE with a sensitivity and negative predictive value of 97 to 99%.  At this time, this assay has not been approved by the FDA to exclude DVT/VTE. Results should be correlated with clinical presentation. Performed at [facility], [address] [place], [place] [address]   Novel Coronavirus,NAA,(SEND-OUT TO REF LAB - TAT 24-48 hrs); Hosp Order     Status: None   Collection Time: 02/25/19  5:09 PM  Result Value Ref Range   SARS-CoV-2, NAA NOT DETECTED NOT DETECTED    Comment: (NOTE) This test was developed and its performance  characteristics determined by [employer]. This test has not been FDA cleared or approved. This test has been authorized by FDA under an Emergency Use Authorization (EUA). This test is only authorized for the duration of time the declaration that circumstances exist justifying the authorization of the emergency use of in vitro diagnostic tests for detection of SARS-CoV-2 virus and/or diagnosis of COVID-19 infection under section 564(b)(1) of the Act, 21 U.S.C. 025KYH-0(W)(2), unless the authorization is terminated or revoked sooner. When diagnostic testing is negative, the possibility of a false negative result should be considered in the context of a patient's recent exposures and the presence of clinical signs and symptoms consistent with COVID-19. An individual without symptoms of COVID-19 and who is not shedding SARS-CoV-2 virus would expect to have a negative (not detected) result in this assay. Performed  At: [facility] [address] [place], [place] [id number] Rush Farmer MD VO:[phone]    Coronavirus Source NASOPHARYNGEAL     Comment:  Performed at [facility], [address] [place], [place] [address]  Urinalysis, Routine w reflex microscopic     Status: Abnormal   Collection Time: 02/25/19  5:12 PM  Result Value Ref Range   Color, Urine YELLOW YELLOW   APPearance CLEAR CLEAR   Specific Gravity, Urine <1.005 (L) 1.005 - 1.030   pH 6.0 5.0 - 8.0   Glucose, UA NEGATIVE NEGATIVE mg/dL   Hgb urine dipstick NEGATIVE NEGATIVE   Bilirubin Urine NEGATIVE NEGATIVE   Ketones, ur NEGATIVE NEGATIVE mg/dL   Protein, ur NEGATIVE NEGATIVE mg/dL   Nitrite NEGATIVE NEGATIVE   Leukocytes,Ua NEGATIVE NEGATIVE    Comment: Microscopic not done on urines with negative protein, blood, leukocytes, nitrite, or glucose < 500 mg/dL. Performed at [facility], [address] [place], [place] [address]   CBC with Differential/Platelet     Status:  None   Collection Time: 03/01/19  2:55 PM  Result Value Ref Range   WBC 5.8 4.0 - 10.5 K/uL   RBC 4.94 4.22 - 5.81 MIL/uL   Hemoglobin 16.2 13.0 - 17.0 g/dL   HCT 48.3 39.0 - 52.0 %   MCV 97.8 80.0 - 100.0 fL   MCH 32.8 26.0 - 34.0 pg   MCHC 33.5 30.0 - 36.0 g/dL   RDW 12.6 11.5 - 15.5 %   Platelets 171 150 - 400 K/uL   nRBC 0.0 0.0 - 0.2 %   Neutrophils Relative % 58 %   Neutro Abs 3.4 1.7 - 7.7 K/uL   Lymphocytes Relative 29 %   Lymphs Abs 1.7 0.7 - 4.0 K/uL   Monocytes Relative 8 %   Monocytes Absolute 0.5 0.1 - 1.0 K/uL   Eosinophils Relative 4 %   Eosinophils Absolute 0.2 0.0 - 0.5 K/uL   Basophils Relative 1 %   Basophils Absolute 0.0 0.0 - 0.1 K/uL   Immature Granulocytes 0 %   Abs Immature Granulocytes 0.02 0.00 - 0.07 K/uL    Comment: Performed at [facility], [address] [place], [place] [address]  Comprehensive metabolic panel     Status: None   Collection Time: 03/01/19  2:55 PM  Result Value Ref Range   Sodium 135 135 - 145 mmol/L   Potassium 4.3 3.5 - 5.1 mmol/L   Chloride 103 98 - 111 mmol/L   CO2 22 22 - 32 mmol/L   Glucose, Bld 97 70 - 99 mg/dL   BUN 16 6 - 20 mg/dL   Creatinine, Ser 0.89 0.61 - 1.24 mg/dL   Calcium 8.9 8.9 - 10.3 mg/dL   Total Protein 7.2 6.5 - 8.1 g/dL   Albumin 4.2 3.5 - 5.0 g/dL   AST 25 15 - 41 U/L   ALT 35 0 - 44 U/L   Alkaline Phosphatase 62 38 - 126 U/L   Total Bilirubin 0.5 0.3 - 1.2 mg/dL   GFR calc non Af Amer >60 >60 mL/min   GFR calc Af Amer >60 >60 mL/min   Anion gap 10 5 - 15    Comment: Performed at [facility], [address] [place], [place] [address]  Lipase, blood     Status: None   Collection Time: 03/01/19  2:55 PM  Result Value Ref Range   Lipase 33 11 - 51 U/L    Comment: Performed at [facility], [address] [place], [place] [address]  Magnesium     Status:  None   Collection Time: 03/01/19  2:55 PM  Result Value Ref Range   Magnesium 2.0 1.7 - 2.4 mg/dL     Comment: Performed at [facility], [address] [place], [place] [address]  Lipase, blood     Status: None   Collection Time: 03/04/19  3:04 PM  Result Value Ref Range   Lipase 29 11 - 51 U/L    Comment: Performed at [facility], [place] [address], [place], [place] [address]  Comprehensive metabolic panel     Status: Abnormal   Collection Time: 03/04/19  3:04 PM  Result Value Ref Range   Sodium 137 135 - 145 mmol/L   Potassium 3.8 3.5 - 5.1 mmol/L   Chloride 102 98 - 111 mmol/L   CO2 27 22 - 32 mmol/L   Glucose, Bld 114 (H) 70 - 99 mg/dL   BUN 12 6 - 20 mg/dL   Creatinine, Ser 1.15 0.61 - 1.24 mg/dL   Calcium 9.4 8.9 - 10.3 mg/dL   Total Protein 6.7 6.5 - 8.1 g/dL   Albumin 4.0 3.5 - 5.0 g/dL   AST 29 15 - 41 U/L   ALT 28 0 - 44 U/L   Alkaline Phosphatase 58 38 - 126 U/L   Total Bilirubin 0.6 0.3 - 1.2 mg/dL   GFR calc non Af Amer >60 >60 mL/min   GFR calc Af Amer >60 >60 mL/min   Anion gap 8 5 - 15    Comment: Performed at [facility] [address] [place], [place] [address]  CBC     Status: None   Collection Time: 03/04/19  3:04 PM  Result Value Ref Range   WBC 6.2 4.0 - 10.5 K/uL   RBC 4.56 4.22 - 5.81 MIL/uL   Hemoglobin 15.2 13.0 - 17.0 g/dL   HCT 43.5 39.0 - 52.0 %   MCV 95.4 80.0 - 100.0 fL   MCH 33.3 26.0 - 34.0 pg   MCHC 34.9 30.0 - 36.0 g/dL   RDW 12.6 11.5 - 15.5 %   Platelets 189 150 - 400 K/uL   nRBC 0.0 0.0 - 0.2 %    Comment: Performed at [facility], [place] [address], [place], [place] [address]  Troponin I - ONCE - STAT     Status: None   Collection Time: 03/04/19  3:04 PM  Result Value Ref Range   Troponin I <0.03 <0.03 ng/mL    Comment: Performed at [facility], [place] [address] [place], [place] [address]  Urinalysis, Routine w reflex microscopic     Status: Abnormal   Collection Time: 03/04/19  4:43 PM  Result Value Ref Range   Color, Urine COLORLESS (A) YELLOW   APPearance CLEAR CLEAR   Specific Gravity,  Urine 1.002 (L) 1.005 - 1.030   pH 6.0 5.0 - 8.0   Glucose, UA NEGATIVE NEGATIVE mg/dL   Hgb urine dipstick NEGATIVE NEGATIVE   Bilirubin Urine NEGATIVE NEGATIVE   Ketones, ur NEGATIVE NEGATIVE mg/dL   Protein, ur NEGATIVE NEGATIVE mg/dL   Nitrite NEGATIVE NEGATIVE   Leukocytes,Ua NEGATIVE NEGATIVE    Comment: Performed at [facility] [address], [place], [place] [address]  H. pylori antibody, IgG     Status: None   Collection Time: 03/05/19 10:37 AM  Result Value Ref Range   H Pylori IgG Negative Negative   Assessment/Plan: 1. Diarrhea, unspecified type 2. Gastritis and duodenitis Negative ER  workup. Suspect Gastritis and potential ulceration. Is taking PPI now but has not started Carafate. Recommend he start this ASAP. Supportive measures reviewed. Will check H. Pylori today. Giving loose stools that are continual (patient with history of IBS but feels this is different) will check stool studies today. Has follow-up with GI scheduled for next week. Will need further assessment, including EGD and potential colonoscopy.  - Stool Culture - Clostridium difficile Toxin B, Qualitative, Real-Time PCR([facility]) - Ova and parasite examination - H. pylori antibody, IgG  3. Anxiety and depression Recently had Lexapro increased to 20 mg daily. Continue same. Will increase Alprazolam to 1 mg tablet, taking 1/2-1 tablet up to TID PRN. Suspect anxiety is contributing significantly to GI symptoms. Close follow-up scheduled.    Leeanne Rio, PA-C

## 2019-03-08 ENCOUNTER — Telehealth: Payer: Self-pay | Admitting: Physician Assistant

## 2019-03-08 DIAGNOSIS — G894 Chronic pain syndrome: Secondary | ICD-10-CM

## 2019-03-08 MED ORDER — GABAPENTIN 300 MG PO CAPS: ORAL_CAPSULE | ORAL | 1 refills | Status: DC

## 2019-03-08 MED FILL — GABAPENTIN 300 MG CAPSULE: 300 | 30 days supply | Qty: 270 | Fill #0

## 2019-03-08 NOTE — Telephone Encounter (Signed)
Pt states that he has ran out of gabapentin and asking for a refill, Patent attorney.  Pt has also left stool sample, I placed this in the lab.

## 2019-03-08 NOTE — Telephone Encounter (Signed)
Refill sent.

## 2019-03-10 ENCOUNTER — Encounter: Payer: Self-pay | Admitting: Gastroenterology

## 2019-03-10 ENCOUNTER — Other Ambulatory Visit: Payer: Self-pay

## 2019-03-10 ENCOUNTER — Ambulatory Visit (INDEPENDENT_AMBULATORY_CARE_PROVIDER_SITE_OTHER): Payer: Self-pay | Admitting: Gastroenterology

## 2019-03-10 VITALS — Ht 73.0 in | Wt 280.0 lb

## 2019-03-10 DIAGNOSIS — R1013 Epigastric pain: Secondary | ICD-10-CM

## 2019-03-10 DIAGNOSIS — R112 Nausea with vomiting, unspecified: Secondary | ICD-10-CM

## 2019-03-10 DIAGNOSIS — G8929 Other chronic pain: Secondary | ICD-10-CM

## 2019-03-10 DIAGNOSIS — R197 Diarrhea, unspecified: Secondary | ICD-10-CM

## 2019-03-10 MED ORDER — PEG 3350-KCL-NA BICARB-NACL 420 G PO SOLR: 4000.0000 mL | ORAL | 0 refills | Status: DC
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
## 2019-03-10 NOTE — Patient Instructions (Addendum)
He is going to start taking a single Imodium pill shortly after he wakes up in the morning and also when he goes to bed at night for his diarrhea.   We will arrange a colonoscopy and upper endoscopy at his soonest convenience to look into his diarrhea, epigastric pain, nausea.  Thank you for entrusting me with your care and choosing [facility].  Dr Ardis Hughs

## 2019-03-12 LAB — STOOL CULTURE
MICRO NUMBER:: 483223
MICRO NUMBER:: 483224
MICRO NUMBER:: 483226
SHIGA RESULT:: NOT DETECTED
SPECIMEN QUALITY:: ADEQUATE
SPECIMEN QUALITY:: ADEQUATE
SPECIMEN QUALITY:: ADEQUATE

## 2019-03-12 LAB — CLOSTRIDIUM DIFFICILE TOXIN B, QUALITATIVE, REAL-TIME PCR: Toxigenic C. Difficile by PCR: NOT DETECTED

## 2019-03-12 LAB — OVA AND PARASITE EXAMINATION
CONCENTRATE RESULT:: NONE SEEN
MICRO NUMBER:: 483225
SPECIMEN QUALITY:: ADEQUATE
TRICHROME RESULT:: NONE SEEN

## 2019-03-16 ENCOUNTER — Telehealth: Payer: Self-pay | Admitting: Gastroenterology

## 2019-03-16 NOTE — Telephone Encounter (Signed)
Pt called and stated that his symptoms are getting worse. He is passing clots and light headed and feels like he may be passing out. And needs to be seen asap

## 2019-03-16 NOTE — Telephone Encounter (Signed)
Spoke with pt and let him know if he is passing clots, dizzy and lightheaded he should go to the ER to be evaluated. Pt verbalized understanding.

## 2019-03-17 ENCOUNTER — Other Ambulatory Visit: Payer: Self-pay

## 2019-03-17 ENCOUNTER — Encounter (HOSPITAL_COMMUNITY): Payer: Self-pay | Admitting: Emergency Medicine

## 2019-03-17 ENCOUNTER — Emergency Department (HOSPITAL_COMMUNITY)
Admission: EM | Admit: 2019-03-17 | Discharge: 2019-03-17 | Disposition: A | Discharge status: Home / Self Care | Payer: Medicaid Other | Attending: Emergency Medicine | Admitting: Emergency Medicine

## 2019-03-17 DIAGNOSIS — F419 Anxiety disorder, unspecified: Secondary | ICD-10-CM | POA: Insufficient documentation

## 2019-03-17 DIAGNOSIS — R1084 Generalized abdominal pain: Secondary | ICD-10-CM | POA: Insufficient documentation

## 2019-03-17 DIAGNOSIS — F17210 Nicotine dependence, cigarettes, uncomplicated: Secondary | ICD-10-CM | POA: Insufficient documentation

## 2019-03-17 DIAGNOSIS — R110 Nausea: Secondary | ICD-10-CM | POA: Insufficient documentation

## 2019-03-17 DIAGNOSIS — Z79899 Other long term (current) drug therapy: Secondary | ICD-10-CM | POA: Insufficient documentation

## 2019-03-17 DIAGNOSIS — Z85528 Personal history of other malignant neoplasm of kidney: Secondary | ICD-10-CM | POA: Insufficient documentation

## 2019-03-17 DIAGNOSIS — R42 Dizziness and giddiness: Secondary | ICD-10-CM | POA: Diagnosis not present

## 2019-03-17 DIAGNOSIS — R0602 Shortness of breath: Secondary | ICD-10-CM | POA: Insufficient documentation

## 2019-03-17 DIAGNOSIS — R5381 Other malaise: Secondary | ICD-10-CM

## 2019-03-17 LAB — COMPREHENSIVE METABOLIC PANEL
ALT: 32 U/L (ref 0–44)
AST: 24 U/L (ref 15–41)
Albumin: 3.9 g/dL (ref 3.5–5.0)
Alkaline Phosphatase: 59 U/L (ref 38–126)
Anion gap: 8 (ref 5–15)
BUN: 14 mg/dL (ref 6–20)
CO2: 28 mmol/L (ref 22–32)
Calcium: 9.9 mg/dL (ref 8.9–10.3)
Chloride: 103 mmol/L (ref 98–111)
Creatinine, Ser: 1.16 mg/dL (ref 0.61–1.24)
GFR calc Af Amer: >60 mL/min (ref >60)
GFR calc non Af Amer: >60 mL/min (ref >60)
Glucose, Bld: 96 mg/dL (ref 70–99)
Potassium: 4.8 mmol/L (ref 3.5–5.1)
Sodium: 139 mmol/L (ref 135–145)
Total Bilirubin: 0.7 mg/dL (ref 0.3–1.2)
Total Protein: 6.4 g/dL — ABNORMAL LOW (ref 6.5–8.1)

## 2019-03-17 LAB — CBC WITH DIFFERENTIAL/PLATELET
Abs Immature Granulocytes: 0.03 10*3/uL (ref 0.00–0.07)
Basophils Absolute: 0.1 10*3/uL (ref 0.0–0.1)
Basophils Relative: 1 %
Eosinophils Absolute: 0.4 10*3/uL (ref 0.0–0.5)
Eosinophils Relative: 6 %
HCT: 44.2 % (ref 39.0–52.0)
Hemoglobin: 15.1 g/dL (ref 13.0–17.0)
Immature Granulocytes: 1 %
Lymphocytes Relative: 27 %
Lymphs Abs: 1.7 10*3/uL (ref 0.7–4.0)
MCH: 32.9 pg (ref 26.0–34.0)
MCHC: 34.2 g/dL (ref 30.0–36.0)
MCV: 96.3 fL (ref 80.0–100.0)
Monocytes Absolute: 0.6 10*3/uL (ref 0.1–1.0)
Monocytes Relative: 9 %
Neutro Abs: 3.6 10*3/uL (ref 1.7–7.7)
Neutrophils Relative %: 56 %
Platelets: 194 10*3/uL (ref 150–400)
RBC: 4.59 MIL/uL (ref 4.22–5.81)
RDW: 12.4 % (ref 11.5–15.5)
WBC: 6.3 10*3/uL (ref 4.0–10.5)
nRBC: 0 % (ref 0.0–0.2)

## 2019-03-17 LAB — LIPASE, BLOOD: Lipase: 33 U/L (ref 11–51)

## 2019-03-17 MED ORDER — PROMETHAZINE HCL 25 MG PO TABS: 25.0000 mg | ORAL_TABLET | Freq: Four times a day (QID) | ORAL | 0 refills | Status: DC | PRN

## 2019-03-17 MED ORDER — SODIUM CHLORIDE 0.9 % IV BOLUS
500.0000 mL | Freq: Once | INTRAVENOUS | Status: AC
  Administered 2019-03-17: 500 mL via INTRAVENOUS

## 2019-03-17 MED ORDER — LORAZEPAM 2 MG/ML IJ SOLN
0.5000 mg | Freq: Once | INTRAMUSCULAR | Status: AC
  Administered 2019-03-17: 0.5 mg via INTRAVENOUS
  Filled 2019-03-17: qty 1

## 2019-03-17 NOTE — ED Notes (Signed)
Patient verbalizes understanding of discharge instructions. Opportunity for questioning and answers were provided. Armband removed by staff, pt discharged from ED.  

## 2019-03-17 NOTE — ED Triage Notes (Signed)
Pt arrives to ED from home with complaints of weakness, abdominal pain, confusion, shortness of breath, fatigue, dizziness, and rectal bleeding that's been going on for a couple of months that's gotten worse.

## 2019-03-17 NOTE — Discharge Instructions (Addendum)
Please return for any problem.  Follow-up as previously scheduled with GI.  Drink plenty of fluids.
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
## 2019-03-18 ENCOUNTER — Encounter: Payer: Self-pay | Admitting: Physician Assistant

## 2019-03-18 ENCOUNTER — Ambulatory Visit (INDEPENDENT_AMBULATORY_CARE_PROVIDER_SITE_OTHER): Payer: Self-pay | Admitting: Physician Assistant

## 2019-03-18 ENCOUNTER — Ambulatory Visit: Payer: Self-pay

## 2019-03-18 DIAGNOSIS — K2951 Unspecified chronic gastritis with bleeding: Secondary | ICD-10-CM

## 2019-03-18 DIAGNOSIS — F410 Panic disorder [episodic paroxysmal anxiety] without agoraphobia: Secondary | ICD-10-CM

## 2019-03-18 DIAGNOSIS — R5382 Chronic fatigue, unspecified: Secondary | ICD-10-CM

## 2019-03-18 DIAGNOSIS — F329 Major depressive disorder, single episode, unspecified: Secondary | ICD-10-CM

## 2019-03-18 DIAGNOSIS — J4521 Mild intermittent asthma with (acute) exacerbation: Secondary | ICD-10-CM

## 2019-03-18 DIAGNOSIS — F419 Anxiety disorder, unspecified: Secondary | ICD-10-CM

## 2019-03-18 MED ORDER — MOMETASONE FUROATE 220 MCG/INH IN AEPB: 1.0000 | INHALATION_SPRAY | Freq: Two times a day (BID) | RESPIRATORY_TRACT | 12 refills | Status: DC

## 2019-03-18 MED ORDER — BUSPIRONE HCL 7.5 MG PO TABS: 7.5000 mg | ORAL_TABLET | Freq: Two times a day (BID) | ORAL | 1 refills | Status: DC

## 2019-03-18 MED FILL — BUSPIRONE HCL 7.5 MG TABS: 7.5 | 30 days supply | Qty: 60 | Fill #0

## 2019-03-18 NOTE — Telephone Encounter (Signed)
Patient scheduled for an appointment today with PCP. PCP reviewed triage note

## 2019-03-18 NOTE — Patient Instructions (Addendum)
Instructions have been sent to [place].  Please continue your Alprazolam and Lexparo as directed. Start the BuSpar twice daily. Keep hydrated and try to rest. Cut back on the Albuterol inhaler. Use the Asmanex inhaler as directed each day to help with breathing. I have sent a prescription voucher card to your pharmacy that should make this medication free.  We will follow-up Monday. If any worsening symptoms, would need you to be seen in office or at Urgent Care/Er setting.   I have faxed your letter over to Mrs. Reynolds

## 2019-03-18 NOTE — Telephone Encounter (Signed)
Patient called and says he was told to go to the ED on yesterday for bleeding in his stool, SOB, dizziness. He says he went and nothing was really done to fix what is going on. He says he's still having SOB, some bleeding in his stool, and he's dizzy today. He says he's SOB at rest, wheezing and he's using the inhalers given by [facility]. He says he's having chest pain and dizziness. He says he's been out of work and will need a note saying that he needs to have air in the house before social services will pay for his electric bill. I called the office and spoke to [place], [facility] who transferred the call to Maudie Mercury, [place]. Kim asked to speak to the patient, the call was connected successfully.  Answer Assessment - Initial Assessment Questions 1. RESPIRATORY STATUS: "Describe your breathing?" (e.g., wheezing, shortness of breath, unable to speak, severe coughing)      Shortness of breath, wheezing 2. ONSET: "When did this breathing problem begin?"      For a while over several months 3. PATTERN "Does the difficult breathing come and go, or has it been constant since it started?"      Come and go all day 4. SEVERITY: "How bad is your breathing?" (e.g., mild, moderate, severe)    - MILD: No SOB at rest, mild SOB with walking, speaks normally in sentences, can lay down, no retractions, pulse < 100.    - MODERATE: SOB at rest, SOB with minimal exertion and prefers to sit, cannot lie down flat, speaks in phrases, mild retractions, audible wheezing, pulse 100-120.    - SEVERE: Very SOB at rest, speaks in single words, struggling to breathe, sitting hunched forward, retractions, pulse > 120      Moderate 5. RECURRENT SYMPTOM: "Have you had difficulty breathing before?" If so, ask: "When was the last time?" and "What happened that time?"      Yes for several months 6. CARDIAC HISTORY: "Do you have any history of heart disease?" (e.g., heart attack, angina, bypass surgery, angioplasty)      No 7. LUNG HISTORY: "Do you have  any history of lung disease?"  (e.g., pulmonary embolus, asthma, emphysema)     Yes 8. CAUSE: "What do you think is causing the breathing problem?"      I don't know 9. OTHER SYMPTOMS: "Do you have any other symptoms? (e.g., dizziness, runny nose, cough, chest pain, fever)     Dizziness, chest pain, fatigue, confused, cough 10. PREGNANCY: "Is there any chance you are pregnant?" "When was your last menstrual period?"       N/A 11. TRAVEL: "Have you traveled out of the country in the last month?" (e.g., travel history, exposures)       No  Protocols used: BREATHING DIFFICULTY-A-AH
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
## 2019-03-21 ENCOUNTER — Telehealth: Payer: Self-pay | Admitting: *Deleted

## 2019-03-21 NOTE — Telephone Encounter (Addendum)
Called to complete pre procedure screening. No answer. LMOM.  2nd attempt to reach pt. No answer. Left detailed message for care partner to wait in car in parking lot and to wear mask into building.

## 2019-03-23 ENCOUNTER — Ambulatory Visit (AMBULATORY_SURGERY_CENTER): Payer: Self-pay | Admitting: Gastroenterology

## 2019-03-23 ENCOUNTER — Encounter: Payer: Self-pay | Admitting: Gastroenterology

## 2019-03-23 ENCOUNTER — Other Ambulatory Visit: Payer: Self-pay

## 2019-03-23 VITALS — BP 149/77 | HR 76 | Temp 98.5°F | Resp 13 | Ht 73.0 in | Wt 280.0 lb

## 2019-03-23 DIAGNOSIS — R197 Diarrhea, unspecified: Secondary | ICD-10-CM

## 2019-03-23 DIAGNOSIS — K5730 Diverticulosis of large intestine without perforation or abscess without bleeding: Secondary | ICD-10-CM

## 2019-03-23 DIAGNOSIS — Z860100 Personal history of colon polyps, unspecified: Secondary | ICD-10-CM

## 2019-03-23 DIAGNOSIS — K2970 Gastritis, unspecified, without bleeding: Secondary | ICD-10-CM

## 2019-03-23 DIAGNOSIS — G8929 Other chronic pain: Secondary | ICD-10-CM

## 2019-03-23 DIAGNOSIS — K649 Unspecified hemorrhoids: Secondary | ICD-10-CM

## 2019-03-23 MED ORDER — SODIUM CHLORIDE 0.9 % IV SOLN: 500.0000 mL | Freq: Once | INTRAVENOUS | Status: DC

## 2019-03-23 NOTE — Op Note (Signed)
[place] Patient Name: [name] Procedure Date: 03/23/2019 1:04 PM MRN: [id number] Endoscopist: Milus Banister , MD Age: 43 Referring MD:  Date of Birth: Mar 20, 1976 Gender: Male Account #: [id number] Procedure:                Colonoscopy Indications:              High risk colon cancer surveillance: Personal                            history of colonic polyps (two subCM adenomas                            removed 2014), alternating constipation/diarrhea,                            intermittent abd pains, overt rectal bleeding (not                            anemic) Medicines:                Monitored Anesthesia Care Procedure:                Pre-Anesthesia Assessment:                           - Prior to the procedure, a History and Physical                            was performed, and patient medications and                            allergies were reviewed. The patient's tolerance of                            previous anesthesia was also reviewed. The risks                            and benefits of the procedure and the sedation                            options and risks were discussed with the patient.                            All questions were answered, and informed consent                            was obtained. Prior Anticoagulants: The patient has                            taken no previous anticoagulant or antiplatelet                            agents. ASA Grade Assessment: II - A patient with  mild systemic disease. After reviewing the risks                            and benefits, the patient was deemed in                            satisfactory condition to undergo the procedure.                           After obtaining informed consent, the colonoscope                            was passed under direct vision. Throughout the                            procedure, the patient's blood pressure, pulse, and                 oxygen saturations were monitored continuously. The                            Colonoscope was introduced through the anus and                            advanced to the the terminal ileum. The colonoscopy                            was performed without difficulty. The patient                            tolerated the procedure well. The quality of the                            bowel preparation was excellent. The terminal                            ileum, ileocecal valve, appendiceal orifice, and                            rectum were photographed. Scope In: 1:10:29 PM Scope Out: 1:19:43 PM Scope Withdrawal Time: 0 hours 6 minutes 39 seconds  Total Procedure Duration: 0 hours 9 minutes 14 seconds  Findings:                 The terminal ileum appeared normal.                           Multiple small-mouthed diverticula were found in                            the left colon.                           External and internal hemorrhoids were found. The  hemorrhoids were medium-sized.                           Biopsies for histology were taken with a cold                            forceps from the entire colon for evaluation of                            microscopic colitis.                           The exam was otherwise without abnormality on                            direct and retroflexion views. Complications:            No immediate complications. Estimated blood loss:                            None. Estimated Blood Loss:     none Impression:               - The examined portion of the ileum was normal.                           - Diverticulosis in the left colon.                           - External and internal hemorrhoids.                           - The examination was otherwise normal on direct                            and retroflexion views.                           - Biopsies were taken with a cold forceps from the                             entire colon for evaluation of microscopic colitis. Recommendation:           - Patient has a contact number available for                            emergencies. The signs and symptoms of potential                            delayed complications were discussed with the                            patient. Return to normal activities tomorrow.                            Written discharge instructions were provided to the  patient.                           - Resume previous diet.                           - Continue present medications.                           - Await pathology results.                           - Can consider in-office internal hemorrhoid                            banding (after results of today's biopsies are                            back). Milus Banister, MD 03/23/2019 1:34:10 PM This report has been signed electronically.

## 2019-03-23 NOTE — Patient Instructions (Signed)
Handouts given for diverticulosis, hemorrhoids and gastritis.  Await biopsy results from Dr. Ardis Hughs.  YOU HAD AN ENDOSCOPIC PROCEDURE TODAY AT [facility]:   Refer to the procedure report that was given to you for any specific questions about what was found during the examination.  If the procedure report does not answer your questions, please call your gastroenterologist to clarify.  If you requested that your care partner not be given the details of your procedure findings, then the procedure report has been included in a sealed envelope for you to review at your convenience later.  YOU SHOULD EXPECT: Some feelings of bloating in the abdomen. Passage of more gas than usual.  Walking can help get rid of the air that was put into your GI tract during the procedure and reduce the bloating. If you had a lower endoscopy (such as a colonoscopy or flexible sigmoidoscopy) you may notice spotting of blood in your stool or on the toilet paper. If you underwent a bowel prep for your procedure, you may not have a normal bowel movement for a few days.  Please Note:  You might notice some irritation and congestion in your nose or some drainage.  This is from the oxygen used during your procedure.  There is no need for concern and it should clear up in a day or so.  SYMPTOMS TO REPORT IMMEDIATELY:   Following lower endoscopy (colonoscopy or flexible sigmoidoscopy):  Excessive amounts of blood in the stool  Significant tenderness or worsening of abdominal pains  Swelling of the abdomen that is new, acute  Fever of 100F or higher   Following upper endoscopy (EGD)  Vomiting of blood or coffee ground material  New chest pain or pain under the shoulder blades  Painful or persistently difficult swallowing  New shortness of breath  Fever of 100F or higher  Black, tarry-looking stools  For urgent or emergent issues, a gastroenterologist can be reached at any hour by calling [phone]  [phone].   DIET:  We do recommend a small meal at first, but then you may proceed to your regular diet.  Drink plenty of fluids but you should avoid alcoholic beverages for 24 hours.  ACTIVITY:  You should plan to take it easy for the rest of today and you should NOT DRIVE or use heavy machinery until tomorrow (because of the sedation medicines used during the test).    FOLLOW UP: Our staff will call the number listed on your records 48-72 hours following your procedure to check on you and address any questions or concerns that you may have regarding the information given to you following your procedure. If we do not reach you, we will leave a message.  We will attempt to reach you two times.  During this call, we will ask if you have developed any symptoms of COVID 19. If you develop any symptoms (ie: fever, flu-like symptoms, shortness of breath, cough etc.) before then, please call [phone].  If you test positive for Covid 19 in the 2 weeks post procedure, please call and report this information to [place].    If any biopsies were taken you will be contacted by phone or by letter within the next 1-3 weeks.  Please call us at [phone] if you have not heard about the biopsies in 3 weeks.    SIGNATURES/CONFIDENTIALITY: You and/or your care partner have signed paperwork which will be entered into your electronic medical record.  These signatures attest to the fact  that that the information above on your After Visit Summary has been reviewed and is understood.  Full responsibility of the confidentiality of this discharge information lies with you and/or your care-partner.

## 2019-03-23 NOTE — Op Note (Signed)
[place] Patient Name: [name] Procedure Date: 03/23/2019 1:04 PM MRN: [id number] Endoscopist: Milus Banister , MD Age: 43 Referring MD:  Date of Birth: [birth year] Gender: Male Account #: [id number] Procedure:                Upper GI endoscopy Indications:              Abdominal pain, Nausea with vomiting Medicines:                Monitored Anesthesia Care Procedure:                Pre-Anesthesia Assessment:                           - Prior to the procedure, a History and Physical                            was performed, and patient medications and                            allergies were reviewed. The patient's tolerance of                            previous anesthesia was also reviewed. The risks                            and benefits of the procedure and the sedation                            options and risks were discussed with the patient.                            All questions were answered, and informed consent                            was obtained. Prior Anticoagulants: The patient has                            taken no previous anticoagulant or antiplatelet                            agents. ASA Grade Assessment: II - A patient with                            mild systemic disease. After reviewing the risks                            and benefits, the patient was deemed in                            satisfactory condition to undergo the procedure.                           After obtaining informed consent, the endoscope was  passed under direct vision. Throughout the                            procedure, the patient's blood pressure, pulse, and                            oxygen saturations were monitored continuously. The                            Endoscope was introduced through the mouth, and                            advanced to the second part of duodenum. The upper                            GI endoscopy was  accomplished without difficulty.                            The patient tolerated the procedure well. Scope In: Scope Out: Findings:                 Small amount of retained liquid in his stomach.                           Moderate inflammation characterized by erythema,                            friability and granularity was found in the entire                            examined stomach. Biopsies were taken with a cold                            forceps for histology.                           The exam was otherwise without abnormality.                            Biopsies were taken from the normal appearing                            duodenum, sent to pathology. Complications:            No immediate complications. Estimated blood loss:                            None. Estimated Blood Loss:     none Impression:               - Gastritis, biopsied to check for H. pylori.                           - The examination was otherwise normal.                           -  Normal appearing duodenum was biopsied to check                            for Celiac Sprue changes. Recommendation:           - Patient has a contact number available for                            emergencies. The signs and symptoms of potential                            delayed complications were discussed with the                            patient. Return to normal activities tomorrow.                            Written discharge instructions were provided to the                            patient.                           - Resume previous diet.                           - Continue present medications.                           - Await pathology results. Milus Banister, MD 03/23/2019 1:37:03 PM This report has been signed electronically.

## 2019-03-23 NOTE — Progress Notes (Signed)
Called to room to assist during endoscopic procedure.  Patient ID and intended procedure confirmed with present staff. Received instructions for my participation in the procedure from the performing physician.  

## 2019-03-23 NOTE — Progress Notes (Signed)
Report given to PACU, vss 

## 2019-03-23 NOTE — Progress Notes (Signed)
Covid screening and temp done by CW.  Vital signs done by JB.

## 2019-03-25 ENCOUNTER — Telehealth: Payer: Self-pay | Admitting: *Deleted

## 2019-03-25 ENCOUNTER — Telehealth: Payer: Self-pay

## 2019-03-25 NOTE — Telephone Encounter (Signed)
First attempt, left VM.  

## 2019-03-25 NOTE — Telephone Encounter (Signed)
  Follow up Call-  No flowsheet data found.   Patient questions:  Do you have a fever, pain , or abdominal swelling? No. Pain Score  0 *  Have you tolerated food without any problems? Yes.    Have you been able to return to your normal activities? Yes.    Do you have any questions about your discharge instructions: Diet   No. Medications  No. Follow up visit  No.  Do you have questions or concerns about your Care? No.  Actions: * If pain score is 4 or above: No action needed, pain <4.   1. Have you developed a fever since your procedure? no  2.   Have you had an respiratory symptoms (SOB or cough) since your procedure? no  3.   Have you tested positive for COVID 19 since your procedure no  4.   Have you had any family members/close contacts diagnosed with the COVID 19 since your procedure?  no   If yes to any of these questions please route to Joylene John, RN and Alphonsa Gin, Therapist, sports.

## 2019-03-25 NOTE — Telephone Encounter (Signed)
Patient returned phone call stating that he is feeling fine and not having any problems since his procedure.

## 2019-03-26 ENCOUNTER — Telehealth: Payer: Self-pay | Admitting: Physician Assistant

## 2019-03-26 ENCOUNTER — Other Ambulatory Visit: Payer: Self-pay | Admitting: Physician Assistant

## 2019-03-26 MED FILL — ONDANSETRON HCL 4 MG TABLET: 4 | 6 days supply | Qty: 20 | Fill #0

## 2019-03-26 MED FILL — CARISOPRODOL 350 MG TABS: 350 | 30 days supply | Qty: 90 | Fill #0

## 2019-03-26 NOTE — Telephone Encounter (Signed)
Copied from [place] [phone]. Topic: Quick Communication - Rx Refill/Question >> Mar 26, 2019  9:30 AM Celene Kras A wrote: Medication: carisoprodol (SOMA) 350 MG tablet, Antacid, and nausea medication  Has the patient contacted their pharmacy? No. Pt states he does not know the neame of medications he is needing refilled and could not find them as he was driving to work. Please advise.  (Agent: If no, request that the patient contact the pharmacy for the refill.) (Agent: If yes, when and what did the pharmacy advise?)  Preferred Pharmacy (with phone number or street name): [place], [address] [place] [address] Phone: [phone] Fax: [phone] Not a 24 hour pharmacy; exact hours not known.    Agent: Please be advised that RX refills may take up to 3 business days. We ask that you follow-up with your pharmacy.

## 2019-03-26 NOTE — Telephone Encounter (Signed)
Rx request received and has been submitted.

## 2019-03-29 ENCOUNTER — Encounter: Payer: Self-pay | Admitting: Gastroenterology

## 2019-04-01 ENCOUNTER — Telehealth: Payer: Self-pay | Admitting: Gastroenterology

## 2019-04-01 NOTE — Telephone Encounter (Signed)
Patient notified of the results and reviewed the letter.  All questions answered. He will call back for additional questions or concerns.

## 2019-04-02 ENCOUNTER — Ambulatory Visit: Payer: Self-pay | Admitting: Physician Assistant

## 2019-04-05 ENCOUNTER — Ambulatory Visit (INDEPENDENT_AMBULATORY_CARE_PROVIDER_SITE_OTHER): Payer: Self-pay | Admitting: Physician Assistant

## 2019-04-05 ENCOUNTER — Other Ambulatory Visit: Payer: Self-pay

## 2019-04-05 ENCOUNTER — Encounter: Payer: Self-pay | Admitting: Physician Assistant

## 2019-04-05 ENCOUNTER — Encounter: Payer: Self-pay | Admitting: Emergency Medicine

## 2019-04-05 DIAGNOSIS — K648 Other hemorrhoids: Secondary | ICD-10-CM

## 2019-04-05 DIAGNOSIS — R0789 Other chest pain: Secondary | ICD-10-CM

## 2019-04-05 DIAGNOSIS — K219 Gastro-esophageal reflux disease without esophagitis: Secondary | ICD-10-CM

## 2019-04-05 MED ORDER — HYDROCORTISONE ACETATE 25 MG RE SUPP: 25.0000 mg | Freq: Two times a day (BID) | RECTAL | 0 refills | Status: DC

## 2019-04-05 MED ORDER — PANTOPRAZOLE SODIUM 40 MG PO TBEC: 40.0000 mg | DELAYED_RELEASE_TABLET | Freq: Every day | ORAL | 3 refills | Status: DC

## 2019-04-05 MED FILL — HEMMOREX-HC 25 MG SUPP: 25 | 6 days supply | Qty: 12 | Fill #0

## 2019-04-05 MED FILL — PANTOPRAZOLE SOD DR 40 MG T: 40 | 30 days supply | Qty: 30 | Fill #0
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
## 2019-04-05 NOTE — Progress Notes (Signed)
I have discussed the procedure for the virtual visit with the patient who has given consent to proceed with assessment and treatment.   [name], CMA     

## 2019-04-06 ENCOUNTER — Telehealth: Payer: Self-pay | Admitting: Physician Assistant

## 2019-04-06 NOTE — Telephone Encounter (Signed)
Pt states that he was returning a phone call, please call back at [phone]

## 2019-04-07 ENCOUNTER — Other Ambulatory Visit: Payer: Self-pay | Admitting: Physician Assistant

## 2019-04-07 DIAGNOSIS — G894 Chronic pain syndrome: Secondary | ICD-10-CM

## 2019-04-07 DIAGNOSIS — G8912 Acute post-thoracotomy pain: Secondary | ICD-10-CM

## 2019-04-07 MED ORDER — ONDANSETRON HCL 4 MG PO TABS: 4.0000 mg | ORAL_TABLET | Freq: Three times a day (TID) | ORAL | 0 refills | Status: DC | PRN

## 2019-04-07 MED ORDER — GABAPENTIN 300 MG PO CAPS: ORAL_CAPSULE | ORAL | 1 refills | Status: DC

## 2019-04-07 MED ORDER — ALPRAZOLAM 1 MG PO TABS: 0.5000 mg | ORAL_TABLET | Freq: Three times a day (TID) | ORAL | 0 refills | Status: DC | PRN

## 2019-04-07 MED FILL — GABAPENTIN 300 MG CAPSULE: 300 | 30 days supply | Qty: 270 | Fill #0

## 2019-04-07 MED FILL — ONDANSETRON HCL 4 MG TABLET: 4 | 10 days supply | Qty: 30 | Fill #0

## 2019-04-07 MED FILL — ALPRAZolam 1 MG TABS: 1 | 30 days supply | Qty: 90 | Fill #0

## 2019-04-07 NOTE — Telephone Encounter (Signed)
Advised patient that rx's were sent to the pharmacy. Will have schedulers work on referral to Psychologist, sport and exercise ASAP. He is agreeable. Advised he can go to the pharmacy to pick up medications,  Patient states he was running a fever yesterday. The abd pain is worsening. Having loose stools alternating with hard stools. Going to the bathroom 10 times a day yesterday. Nausea and vomiting.

## 2019-04-07 NOTE — Telephone Encounter (Signed)
Pt calling in asking to speak with [name], states that he needs a refill on gabapentin, xanax, antacid that hospital gave him and the Rx for his nausea to [facility].  Also pt is asking for status on referral to a general surgeon, states that he is getting worse.

## 2019-04-11 ENCOUNTER — Encounter: Payer: Self-pay | Admitting: Thoracic Surgery (Cardiothoracic Vascular Surgery)

## 2019-04-11 ENCOUNTER — Emergency Department (HOSPITAL_COMMUNITY)
Admission: EM | Admit: 2019-04-11 | Discharge: 2019-04-12 | Disposition: A | Discharge status: Home / Self Care | Payer: Medicaid Other | Source: Home / Self Care | Attending: Emergency Medicine | Admitting: Emergency Medicine

## 2019-04-11 ENCOUNTER — Emergency Department (HOSPITAL_COMMUNITY)
Admission: EM | Admit: 2019-04-11 | Discharge: 2019-04-11 | Disposition: A | Discharge status: Home / Self Care | Payer: Medicaid Other | Attending: Emergency Medicine | Admitting: Emergency Medicine

## 2019-04-11 ENCOUNTER — Emergency Department (HOSPITAL_COMMUNITY): Payer: Medicaid Other

## 2019-04-11 ENCOUNTER — Telehealth: Payer: Self-pay | Admitting: Thoracic Surgery (Cardiothoracic Vascular Surgery)

## 2019-04-11 ENCOUNTER — Encounter (HOSPITAL_COMMUNITY): Payer: Self-pay | Admitting: Student

## 2019-04-11 ENCOUNTER — Other Ambulatory Visit: Payer: Self-pay

## 2019-04-11 DIAGNOSIS — R109 Unspecified abdominal pain: Secondary | ICD-10-CM

## 2019-04-11 DIAGNOSIS — Z79899 Other long term (current) drug therapy: Secondary | ICD-10-CM | POA: Diagnosis not present

## 2019-04-11 DIAGNOSIS — F17210 Nicotine dependence, cigarettes, uncomplicated: Secondary | ICD-10-CM | POA: Insufficient documentation

## 2019-04-11 DIAGNOSIS — G894 Chronic pain syndrome: Secondary | ICD-10-CM | POA: Insufficient documentation

## 2019-04-11 DIAGNOSIS — R0789 Other chest pain: Secondary | ICD-10-CM | POA: Diagnosis not present

## 2019-04-11 DIAGNOSIS — Z85528 Personal history of other malignant neoplasm of kidney: Secondary | ICD-10-CM | POA: Insufficient documentation

## 2019-04-11 DIAGNOSIS — I10 Essential (primary) hypertension: Secondary | ICD-10-CM | POA: Insufficient documentation

## 2019-04-11 DIAGNOSIS — R079 Chest pain, unspecified: Secondary | ICD-10-CM | POA: Insufficient documentation

## 2019-04-11 DIAGNOSIS — R42 Dizziness and giddiness: Secondary | ICD-10-CM | POA: Insufficient documentation

## 2019-04-11 DIAGNOSIS — F419 Anxiety disorder, unspecified: Secondary | ICD-10-CM | POA: Insufficient documentation

## 2019-04-11 LAB — CBC
HCT: 45.7 % (ref 39.0–52.0)
Hemoglobin: 15.6 g/dL (ref 13.0–17.0)
MCH: 33.3 pg (ref 26.0–34.0)
MCHC: 34.1 g/dL (ref 30.0–36.0)
MCV: 97.6 fL (ref 80.0–100.0)
Platelets: 175 10*3/uL (ref 150–400)
RBC: 4.68 MIL/uL (ref 4.22–5.81)
RDW: 12.7 % (ref 11.5–15.5)
WBC: 7 10*3/uL (ref 4.0–10.5)
nRBC: 0 % (ref 0.0–0.2)

## 2019-04-11 LAB — CBC WITH DIFFERENTIAL/PLATELET
Abs Immature Granulocytes: 0.02 10*3/uL (ref 0.00–0.07)
Basophils Absolute: 0 10*3/uL (ref 0.0–0.1)
Basophils Relative: 1 %
Eosinophils Absolute: 0.4 10*3/uL (ref 0.0–0.5)
Eosinophils Relative: 6 %
HCT: 44.7 % (ref 39.0–52.0)
Hemoglobin: 15.3 g/dL (ref 13.0–17.0)
Immature Granulocytes: 0 %
Lymphocytes Relative: 25 %
Lymphs Abs: 1.7 10*3/uL (ref 0.7–4.0)
MCH: 33.2 pg (ref 26.0–34.0)
MCHC: 34.2 g/dL (ref 30.0–36.0)
MCV: 97 fL (ref 80.0–100.0)
Monocytes Absolute: 0.5 10*3/uL (ref 0.1–1.0)
Monocytes Relative: 7 %
Neutro Abs: 4.3 10*3/uL (ref 1.7–7.7)
Neutrophils Relative %: 61 %
Platelets: 179 10*3/uL (ref 150–400)
RBC: 4.61 MIL/uL (ref 4.22–5.81)
RDW: 12.5 % (ref 11.5–15.5)
WBC: 6.9 10*3/uL (ref 4.0–10.5)
nRBC: 0 % (ref 0.0–0.2)

## 2019-04-11 LAB — TROPONIN I
Troponin I: <0.03 ng/mL (ref <0.03)
Troponin I: <0.03 ng/mL (ref <0.03)

## 2019-04-11 LAB — BASIC METABOLIC PANEL
Anion gap: 9 (ref 5–15)
BUN: 13 mg/dL (ref 6–20)
CO2: 23 mmol/L (ref 22–32)
Calcium: 8.8 mg/dL — ABNORMAL LOW (ref 8.9–10.3)
Chloride: 105 mmol/L (ref 98–111)
Creatinine, Ser: 0.91 mg/dL (ref 0.61–1.24)
GFR calc Af Amer: >60 mL/min (ref >60)
GFR calc non Af Amer: >60 mL/min (ref >60)
Glucose, Bld: 87 mg/dL (ref 70–99)
Potassium: 3.8 mmol/L (ref 3.5–5.1)
Sodium: 137 mmol/L (ref 135–145)

## 2019-04-11 LAB — COMPREHENSIVE METABOLIC PANEL
ALT: 22 U/L (ref 0–44)
AST: 23 U/L (ref 15–41)
Albumin: 3.5 g/dL (ref 3.5–5.0)
Alkaline Phosphatase: 50 U/L (ref 38–126)
Anion gap: 8 (ref 5–15)
BUN: 14 mg/dL (ref 6–20)
CO2: 24 mmol/L (ref 22–32)
Calcium: 8.6 mg/dL — ABNORMAL LOW (ref 8.9–10.3)
Chloride: 109 mmol/L (ref 98–111)
Creatinine, Ser: 0.89 mg/dL (ref 0.61–1.24)
GFR calc Af Amer: >60 mL/min (ref >60)
GFR calc non Af Amer: >60 mL/min (ref >60)
Glucose, Bld: 86 mg/dL (ref 70–99)
Potassium: 4.3 mmol/L (ref 3.5–5.1)
Sodium: 141 mmol/L (ref 135–145)
Total Bilirubin: 0.6 mg/dL (ref 0.3–1.2)
Total Protein: 6 g/dL — ABNORMAL LOW (ref 6.5–8.1)

## 2019-04-11 LAB — URINALYSIS, ROUTINE W REFLEX MICROSCOPIC
Bilirubin Urine: NEGATIVE
Glucose, UA: NEGATIVE mg/dL
Hgb urine dipstick: NEGATIVE
Ketones, ur: NEGATIVE mg/dL
Leukocytes,Ua: NEGATIVE
Nitrite: NEGATIVE
Protein, ur: NEGATIVE mg/dL
Specific Gravity, Urine: 1.014 (ref 1.005–1.030)
pH: 7 (ref 5.0–8.0)

## 2019-04-11 LAB — RAPID URINE DRUG SCREEN, HOSP PERFORMED
Amphetamines: NOT DETECTED
Barbiturates: NOT DETECTED
Benzodiazepines: POSITIVE — AB
Cocaine: NOT DETECTED
Opiates: NOT DETECTED
Tetrahydrocannabinol: POSITIVE — AB

## 2019-04-11 LAB — LIPASE, BLOOD: Lipase: 34 U/L (ref 11–51)

## 2019-04-11 MED ORDER — IOHEXOL 350 MG/ML SOLN
100.0000 mL | Freq: Once | INTRAVENOUS | Status: AC | PRN
  Administered 2019-04-11: 100 mL via INTRAVENOUS

## 2019-04-11 MED ORDER — LIDOCAINE VISCOUS HCL 2 % MT SOLN
15.0000 mL | Freq: Once | OROMUCOSAL | Status: AC
  Administered 2019-04-11: 15 mL via ORAL
  Filled 2019-04-11: qty 15

## 2019-04-11 MED ORDER — ALUM & MAG HYDROXIDE-SIMETH 200-200-20 MG/5ML PO SUSP
30.0000 mL | Freq: Once | ORAL | Status: AC
  Administered 2019-04-11: 30 mL via ORAL
  Filled 2019-04-11: qty 30

## 2019-04-11 MED ORDER — ONDANSETRON HCL 4 MG/2ML IJ SOLN
4.0000 mg | Freq: Once | INTRAMUSCULAR | Status: AC
  Administered 2019-04-11: 4 mg via INTRAVENOUS
  Filled 2019-04-11: qty 2

## 2019-04-11 MED ORDER — LIDOCAINE 5 % EX PTCH
1.0000 | MEDICATED_PATCH | CUTANEOUS | Status: DC
  Administered 2019-04-11: 1 via TRANSDERMAL
  Filled 2019-04-11: qty 1

## 2019-04-11 MED ORDER — FENTANYL CITRATE (PF) 100 MCG/2ML IJ SOLN
50.0000 ug | Freq: Once | INTRAMUSCULAR | Status: AC
  Administered 2019-04-11: 50 ug via INTRAVENOUS
  Filled 2019-04-11: qty 2

## 2019-04-11 MED ORDER — FAMOTIDINE IN NACL 20-0.9 MG/50ML-% IV SOLN
20.0000 mg | Freq: Once | INTRAVENOUS | Status: AC
  Administered 2019-04-11: 20 mg via INTRAVENOUS
  Filled 2019-04-11: qty 50

## 2019-04-11 NOTE — ED Notes (Signed)
Patient verbalizes understanding of discharge instructions . Opportunity for questions and answers were provided . Armband removed by staff ,Pt discharged from ED. W/C  offered at D/C  and Declined W/C at D/C and was escorted to lobby by RN.  

## 2019-04-11 NOTE — ED Triage Notes (Addendum)
Pt was just seen here and discharged. Patient c/o continued high blood pressure and just not feeling better. Pt states that he doesn't want to go home and die. Insists that there is "something bad wrong". Patient is crying in triage.
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
## 2019-04-11 NOTE — ED Triage Notes (Signed)
AT time of DC Pt asked if he could have more pain meds. Pt instructed his last Iv pain med was less than one hr ago. Unable to give more Pain meds for DC.
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
## 2019-04-11 NOTE — ED Triage Notes (Signed)
Pt reports central CP that radiates to back. Pt given NGT  1 tab with out pain relief . Pt reports multiple visits to PCP and ED with out answers . Pt has an appt with Cardiologist  On Friday.
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
## 2019-04-12 ENCOUNTER — Encounter: Payer: Self-pay | Admitting: Physician Assistant

## 2019-04-12 ENCOUNTER — Ambulatory Visit: Payer: Self-pay | Admitting: *Deleted

## 2019-04-12 ENCOUNTER — Ambulatory Visit (INDEPENDENT_AMBULATORY_CARE_PROVIDER_SITE_OTHER): Payer: Self-pay | Admitting: Physician Assistant

## 2019-04-12 DIAGNOSIS — R0789 Other chest pain: Secondary | ICD-10-CM

## 2019-04-12 DIAGNOSIS — K2950 Unspecified chronic gastritis without bleeding: Secondary | ICD-10-CM

## 2019-04-12 DIAGNOSIS — K648 Other hemorrhoids: Secondary | ICD-10-CM

## 2019-04-12 MED ORDER — HYDROCODONE-ACETAMINOPHEN 5-325 MG PO TABS: 1.0000 | ORAL_TABLET | Freq: Four times a day (QID) | ORAL | 0 refills | Status: DC | PRN

## 2019-04-12 MED ORDER — DULOXETINE HCL 30 MG PO CPEP: 30.0000 mg | ORAL_CAPSULE | Freq: Every day | ORAL | 3 refills | Status: DC

## 2019-04-12 MED FILL — DULoxetine HCL 30 MG CPEP: 30 | 30 days supply | Qty: 30 | Fill #0

## 2019-04-12 MED FILL — HYDROCODON-APAP 5-325: 5-325 | 4 days supply | Qty: 15 | Fill #0

## 2019-04-12 NOTE — Telephone Encounter (Signed)
Patient has been scheduled for a Virtual visit this afternoon.
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
## 2019-04-12 NOTE — Progress Notes (Signed)
I have discussed the procedure for the virtual visit with the patient who has given consent to proceed with assessment and treatment.   Unique Sillas S Jahdai Padovano, CMA     

## 2019-04-12 NOTE — Telephone Encounter (Signed)
Triage transferred patient over to me. He just keeps repeating that something is wrong with him.  He is talking extremely fast and panic tone, tearful at times.  Patient stated "there is something wrong on the inside of me.  The hospital can't see it. I can't get ahold of my GI doctor or anyone else but someone has to see me today because I'm hurting and I can't deal with this."  Patient stated that he has a fever, but when he was told that if he had a fever I couldn't do an in office visit he said "I don't have a fever right now. It's only when my blood pressure goes up, my body develops a fever which tells me there is something wrong inside me."   Patient was begging for help and continually asking "do you get what I'm trying to say? Does any of this make sense?"   Advised patient that I was sending note to PCP and someone would call him back.

## 2019-04-12 NOTE — Telephone Encounter (Signed)
LM for patient to call for virtual visit

## 2019-04-12 NOTE — ED Notes (Signed)
RN and EPD at bedside. Pt crying, stating that pain is worsening. EDP educating pt about him not having an emergent conditions.

## 2019-04-12 NOTE — Discharge Instructions (Addendum)
You were seen in the Er today for chest and abdominal pain as well as dizziness.  Your labs in the emergency department were reassuring.  Your EKG did not show significant change from prior.  Your imaging from earlier today was also reassuring without acute abnormalities.  Would like you to continue taking your outpatient medicines as prescribed and follow-up very closely with your primary care provider as well as your cardiothoracic surgeon.  Please call the office tomorrow morning for follow-up.  Return to the ER for new or worsening symptoms or any other concerns.

## 2019-04-12 NOTE — ED Notes (Signed)
Mierzwa, sister [phone]

## 2019-04-12 NOTE — Telephone Encounter (Signed)
I am happy to see him today via video visit. I have reviewed all of the ER reports, studies and messaged from ER physician and on-call [place]. I have previously reviewed with him all of of GI provider's findings. I agree that there is nothing emergent present, mainly an exacerbation of chronic pain which we can work on until he sees Dr. Lucianne Lei tright but I feel that the level of pain has acutely exacerbated anxiety almost to the point of hysteria. Really recommend we work on pain control and anxiety medication until his assessment with surgery. If he is still convinced that something more urgent is needed I would recommend that he seek assessment at an outside ER so he Lucianne Lei get a fresh set of eyes on things.

## 2019-04-12 NOTE — ED Notes (Signed)
Pt requesting pain meds. MD notified.

## 2019-04-15 ENCOUNTER — Other Ambulatory Visit: Payer: Self-pay

## 2019-04-15 ENCOUNTER — Other Ambulatory Visit: Payer: Self-pay | Admitting: Cardiothoracic Surgery

## 2019-04-15 DIAGNOSIS — J942 Hemothorax: Secondary | ICD-10-CM

## 2019-04-16 ENCOUNTER — Ambulatory Visit
Admission: RE | Admit: 2019-04-16 | Discharge: 2019-04-16 | Disposition: A | Discharge status: Home / Self Care | Payer: Self-pay | Source: Ambulatory Visit | Attending: Cardiothoracic Surgery | Admitting: Cardiothoracic Surgery

## 2019-04-16 ENCOUNTER — Ambulatory Visit (INDEPENDENT_AMBULATORY_CARE_PROVIDER_SITE_OTHER): Payer: Self-pay | Admitting: Cardiothoracic Surgery

## 2019-04-16 ENCOUNTER — Encounter: Payer: Self-pay | Admitting: Cardiothoracic Surgery

## 2019-04-16 VITALS — BP 132/80 | HR 99 | Temp 98.1°F | Resp 20 | Ht 73.0 in | Wt 270.0 lb

## 2019-04-16 DIAGNOSIS — J942 Hemothorax: Secondary | ICD-10-CM

## 2019-04-16 DIAGNOSIS — G8912 Acute post-thoracotomy pain: Secondary | ICD-10-CM
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
## 2019-04-20 ENCOUNTER — Telehealth: Payer: Self-pay | Admitting: *Deleted

## 2019-04-20 ENCOUNTER — Other Ambulatory Visit: Payer: Self-pay

## 2019-04-20 ENCOUNTER — Ambulatory Visit (INDEPENDENT_AMBULATORY_CARE_PROVIDER_SITE_OTHER): Payer: Self-pay | Admitting: Physician Assistant

## 2019-04-20 ENCOUNTER — Encounter: Payer: Self-pay | Admitting: Physician Assistant

## 2019-04-20 DIAGNOSIS — Z9889 Other specified postprocedural states: Secondary | ICD-10-CM

## 2019-04-20 DIAGNOSIS — R222 Localized swelling, mass and lump, trunk: Secondary | ICD-10-CM

## 2019-04-20 DIAGNOSIS — R0789 Other chest pain: Secondary | ICD-10-CM

## 2019-04-20 NOTE — Telephone Encounter (Signed)
I can see him via virtual visit at 3:20 if still open but please make a 40 minute visit as we have a lot to discuss giving his anxiety reaching almost manic level with all of this. He has had a substantial workup performed, including multiple specialists and there is not a emergent condition present.
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
## 2019-04-20 NOTE — Telephone Encounter (Signed)
Patient called in asking that I put his call through to [facility] asap.  Advised that I could send a note back and he said that he needed a call from [place].   When first asked what was going on patient told me that he would not talk to me about it that he only needed to talk with [facility].  Advised patient that he would have to give me more information. Patient stated "it is an emergency. My symptoms are worse and worse and no surgeon will touch me. There are things that the thorasic-surgeon told me about [name] that he needs to know. I need to talk with him ASAP"  Advised patient that I would send the message back.

## 2019-04-20 NOTE — Telephone Encounter (Signed)
Patient has been scheduled a 40 minute appointment at 3:20 this afternoon.

## 2019-04-20 NOTE — Progress Notes (Signed)
I have discussed the procedure for the virtual visit with the patient who has given consent to proceed with assessment and treatment.   Treylon Henard S Dilara Navarrete, CMA     

## 2019-04-27 ENCOUNTER — Encounter (HOSPITAL_COMMUNITY): Payer: Self-pay | Admitting: *Deleted

## 2019-04-27 ENCOUNTER — Emergency Department (HOSPITAL_COMMUNITY)
Admission: EM | Admit: 2019-04-27 | Discharge: 2019-04-28 | Disposition: A | Discharge status: Home / Self Care | Payer: Medicaid Other | Attending: Emergency Medicine | Admitting: Emergency Medicine

## 2019-04-27 DIAGNOSIS — Z7289 Other problems related to lifestyle: Secondary | ICD-10-CM | POA: Insufficient documentation

## 2019-04-27 DIAGNOSIS — Z85528 Personal history of other malignant neoplasm of kidney: Secondary | ICD-10-CM | POA: Insufficient documentation

## 2019-04-27 DIAGNOSIS — Z79899 Other long term (current) drug therapy: Secondary | ICD-10-CM | POA: Insufficient documentation

## 2019-04-27 DIAGNOSIS — Z765 Malingerer [conscious simulation]: Secondary | ICD-10-CM

## 2019-04-27 DIAGNOSIS — R1012 Left upper quadrant pain: Secondary | ICD-10-CM | POA: Diagnosis present

## 2019-04-27 DIAGNOSIS — G8929 Other chronic pain: Secondary | ICD-10-CM | POA: Insufficient documentation

## 2019-04-27 DIAGNOSIS — F17210 Nicotine dependence, cigarettes, uncomplicated: Secondary | ICD-10-CM | POA: Insufficient documentation

## 2019-04-27 DIAGNOSIS — R079 Chest pain, unspecified: Secondary | ICD-10-CM

## 2019-04-27 DIAGNOSIS — R0789 Other chest pain: Secondary | ICD-10-CM | POA: Diagnosis not present

## 2019-04-27 NOTE — ED Triage Notes (Signed)
Pt in from home via [facility], per report pt in c/o LUQ abd pain with emesis, pt hx of the same symptoms, pt rcvd 4 mg Zofran and 200 mcg Fentanyl pta, pt A&O x4, yelling out during transport to room, upon initial MD encounter pt uncooperative with physician while attempting to discuss pts concerns

## 2019-04-28 ENCOUNTER — Encounter: Payer: Self-pay | Admitting: Physician Assistant

## 2019-04-28 ENCOUNTER — Other Ambulatory Visit: Payer: Self-pay | Admitting: *Deleted

## 2019-04-28 DIAGNOSIS — F329 Major depressive disorder, single episode, unspecified: Secondary | ICD-10-CM

## 2019-04-28 DIAGNOSIS — M792 Neuralgia and neuritis, unspecified: Secondary | ICD-10-CM

## 2019-04-28 DIAGNOSIS — G8929 Other chronic pain: Secondary | ICD-10-CM

## 2019-04-28 DIAGNOSIS — F419 Anxiety disorder, unspecified: Secondary | ICD-10-CM

## 2019-04-28 LAB — COMPREHENSIVE METABOLIC PANEL
ALT: 27 U/L (ref 0–44)
AST: 32 U/L (ref 15–41)
Albumin: 3.7 g/dL (ref 3.5–5.0)
Alkaline Phosphatase: 55 U/L (ref 38–126)
Anion gap: 12 (ref 5–15)
BUN: 16 mg/dL (ref 6–20)
CO2: 19 mmol/L — ABNORMAL LOW (ref 22–32)
Calcium: 8.8 mg/dL — ABNORMAL LOW (ref 8.9–10.3)
Chloride: 105 mmol/L (ref 98–111)
Creatinine, Ser: 1.03 mg/dL (ref 0.61–1.24)
GFR calc Af Amer: >60 mL/min (ref >60)
GFR calc non Af Amer: >60 mL/min (ref >60)
Glucose, Bld: 103 mg/dL — ABNORMAL HIGH (ref 70–99)
Potassium: 4 mmol/L (ref 3.5–5.1)
Sodium: 136 mmol/L (ref 135–145)
Total Bilirubin: 0.8 mg/dL (ref 0.3–1.2)
Total Protein: 5.9 g/dL — ABNORMAL LOW (ref 6.5–8.1)

## 2019-04-28 LAB — RAPID URINE DRUG SCREEN, HOSP PERFORMED
Amphetamines: NOT DETECTED
Barbiturates: NOT DETECTED
Benzodiazepines: POSITIVE — AB
Cocaine: NOT DETECTED
Opiates: NOT DETECTED
Tetrahydrocannabinol: POSITIVE — AB

## 2019-04-28 LAB — CBC WITH DIFFERENTIAL/PLATELET
Abs Immature Granulocytes: 0.05 10*3/uL (ref 0.00–0.07)
Basophils Absolute: 0.1 10*3/uL (ref 0.0–0.1)
Basophils Relative: 1 %
Eosinophils Absolute: 0.4 10*3/uL (ref 0.0–0.5)
Eosinophils Relative: 5 %
HCT: 44.1 % (ref 39.0–52.0)
Hemoglobin: 15.7 g/dL (ref 13.0–17.0)
Immature Granulocytes: 1 %
Lymphocytes Relative: 23 %
Lymphs Abs: 2.1 10*3/uL (ref 0.7–4.0)
MCH: 35.1 pg — ABNORMAL HIGH (ref 26.0–34.0)
MCHC: 35.6 g/dL (ref 30.0–36.0)
MCV: 98.7 fL (ref 80.0–100.0)
Monocytes Absolute: 0.7 10*3/uL (ref 0.1–1.0)
Monocytes Relative: 7 %
Neutro Abs: 5.9 10*3/uL (ref 1.7–7.7)
Neutrophils Relative %: 63 %
Platelets: 207 10*3/uL (ref 150–400)
RBC: 4.47 MIL/uL (ref 4.22–5.81)
RDW: 12.3 % (ref 11.5–15.5)
WBC: 9.2 10*3/uL (ref 4.0–10.5)
nRBC: 0 % (ref 0.0–0.2)

## 2019-04-28 LAB — LIPASE, BLOOD: Lipase: 34 U/L (ref 11–51)

## 2019-04-28 MED ORDER — GABAPENTIN 400 MG PO CAPS
800.0000 mg | ORAL_CAPSULE | Freq: Once | ORAL | Status: AC
  Administered 2019-04-28: 800 mg via ORAL
  Filled 2019-04-28: qty 2

## 2019-04-28 MED ORDER — BUSPIRONE HCL 7.5 MG PO TABS: 7.5000 mg | ORAL_TABLET | Freq: Two times a day (BID) | ORAL | 1 refills | Status: DC

## 2019-04-28 MED ORDER — ONDANSETRON 4 MG PO TBDP: 4.0000 mg | ORAL_TABLET | Freq: Three times a day (TID) | ORAL | 0 refills | Status: DC | PRN

## 2019-04-28 MED ORDER — ALPRAZOLAM 1 MG PO TABS: 0.5000 mg | ORAL_TABLET | Freq: Three times a day (TID) | ORAL | 0 refills | Status: DC | PRN

## 2019-04-28 MED ORDER — KETOROLAC TROMETHAMINE 30 MG/ML IJ SOLN
30.0000 mg | Freq: Once | INTRAMUSCULAR | Status: AC
  Administered 2019-04-28: 30 mg via INTRAVENOUS
  Filled 2019-04-28: qty 1

## 2019-04-28 MED ORDER — LIDOCAINE 5 % EX PTCH
1.0000 | MEDICATED_PATCH | Freq: Once | CUTANEOUS | Status: DC
  Administered 2019-04-28: 1 via TRANSDERMAL
  Filled 2019-04-28: qty 1

## 2019-04-28 MED ORDER — PROMETHAZINE HCL 25 MG/ML IJ SOLN
25.0000 mg | Freq: Once | INTRAMUSCULAR | Status: AC
  Administered 2019-04-28: 25 mg via INTRAVENOUS
  Filled 2019-04-28: qty 1

## 2019-04-28 MED ORDER — PANTOPRAZOLE SODIUM 40 MG PO TBEC: 40.0000 mg | DELAYED_RELEASE_TABLET | Freq: Every day | ORAL | 3 refills | Status: DC

## 2019-04-28 MED ORDER — CARISOPRODOL 350 MG PO TABS: ORAL_TABLET | ORAL | 0 refills | Status: DC

## 2019-04-28 MED ORDER — SODIUM CHLORIDE 0.9 % IV BOLUS (SEPSIS)
1000.0000 mL | Freq: Once | INTRAVENOUS | Status: AC
  Administered 2019-04-28: 1000 mL via INTRAVENOUS

## 2019-04-28 MED FILL — ALPRAZolam 1 MG TABS: 1 | 15 days supply | Qty: 45 | Fill #0

## 2019-04-28 MED FILL — CARISOPRODOL 350 MG TABS: 350 | 30 days supply | Qty: 90 | Fill #0

## 2019-04-28 MED FILL — BUSPIRONE HCL 7.5 MG TABS: 7.5 | 30 days supply | Qty: 60 | Fill #0

## 2019-04-28 MED FILL — PANTOPRAZOLE SOD DR 40 MG T: 40 | 30 days supply | Qty: 30 | Fill #0

## 2019-04-28 NOTE — ED Notes (Addendum)
Attempted to arouse patient; he falls asleep after muttering incoherently a couple of sentences. Pt can follow commands, but is clearly altered possibly due to medication administered earlier.

## 2019-04-28 NOTE — ED Notes (Signed)
Placed urinal at bedside and instructed patient he needs to void. This tech will check back in 10 min.

## 2019-04-28 NOTE — Telephone Encounter (Signed)
[facility] (Dr. Lianne Moris) states that after reviewed patient's records he feels like patient needs to see interventional radiology for neuropathic pain-cryoablation for the nerve.   Routing to PCP to advise on who we would need to use for this.

## 2019-04-28 NOTE — ED Notes (Signed)
Patient again asked for answers as to why he is having pain. Pt states he is concerned he is going to die. Pt assured that we would not allow him to leave if we felt like he was in imminent danger. Pt again encouraged to follow up with neurosurgeon. Pt then requested pain medication again and was advised to follow up with family dr or pain clinic for pain mgmt.

## 2019-04-28 NOTE — Telephone Encounter (Signed)
LM to patient to call.

## 2019-04-28 NOTE — Telephone Encounter (Signed)
Please let patient know this. This is what the other surgeon here recommended but patient refused. Would he be ok with [place] having him see an interventional radiologist regarding this?

## 2019-04-28 NOTE — Telephone Encounter (Signed)
Patient called in wanting to know if there was anything we could do for him.  He states that he was at the hospital again and was told there was nothing they could do for him. Patient states that there is something wrong with him and he is desperate for help and answers,  Girlfriend got on the phone and said that she is really worried about patient because he says he is not able to walk, drive, can barely talk and is in pain all the time.  She stated "If someone doesn't help him, I don't think he will live until next week."   I spoke with PCP and was advised that notes have been reviewed and that there was nothing emergent so PCP wanted [place] to check on the status of the referral and let patient know that he could reach out to pain management until he could see the surgeon.   It was also noted that patient had marijuana in his system so he should be instructed to use only medication that he is prescribed, not marijuana.    I spoke with patient and gave the information from PCP.  I let him know that he needed to stop the marijuana use because this will prevent him from getting medications.  He asked if PCP was still willing to prescribe if he stopped now, I advised that I could not make that decision because I am not the one prescribing to him. I explained that Medications cannot be prescribed when there is known drug use, and since he has tested positive multiple times that this is only going to hinder him getting medications.  He stated verbal understanding and said he was going to stop. Patient is aware that we will check on the status of the referral and let him know.  Patient is requesting refill of the following medications.  He is aware that I am sending the note back to the provider for approval.  -Soma 350mg  -Xanax 1mg  -Buspar 7.5mg  -Protonix  40mg 

## 2019-04-28 NOTE — ED Notes (Signed)
Attempted to arouse patient again, still unable to stay awake to give urine or hold cup to drink. Patient sat up in bed and all lights turned on.

## 2019-04-28 NOTE — ED Notes (Signed)
Pt requires multiple attempts to arouse with verbal stimuli, pt able to follow commands but quickly goes back to sleep, pt aware of need for urine sample, pt aware of MD order to in and out and refuses, MD notified with plan to attempt UA once pt is more aroused
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
## 2019-04-28 NOTE — ED Notes (Signed)
Patient awaiting for a ride; continuously requests pain medications and the "reason why I am having all this pain". Pt encourage to keep current appointments with his specialists.

## 2019-04-29 ENCOUNTER — Telehealth: Payer: Self-pay | Admitting: Physician Assistant

## 2019-04-29 NOTE — Telephone Encounter (Signed)
He will need to follow-up with the pain specialist. We can attempt to switch the Gabapentin for Lyrica if he would like to see if this helps more but giving severity of pain and likely neuropathic source, we need the specialist so we can get him more substantial relief. If he still wants to try the Lyrica then we can.

## 2019-04-29 NOTE — Telephone Encounter (Signed)
Patient called back in and I gave him the information from the provider stating that we would no longer be able to prescribe medication.   Patient states "I only smoked marijuana once. I'm a big guy and it takes a long time for it to get out of my system. But I'll stop. I won't do it anymore so [name] can continue my medications." I advised patient that it does not work that way. Advised patient that he tested positive as far back as March - and then again a week ago - Advised that we were aware that this was not a 1 time thing, and that we could not continue to give medications.  Advised patient that PCP recommended Referral to Psychiatry for anxiety and Pain Management for pain since we will no longer be prescribing pain medication.  Patient sighed and said that he would not see Psych because he knows this is not an issue with him. He stated "everyone keeps saying that nothing is wrong with me and everyone is wrong. Something is bad wrong and I need doctors to stop passing me off. Every day I am getting worse and no one wants to do anything." I advised patient that we were not just "passing him off" that we are willing to make these referrals to get him the help that he needs.   Patient agrees to pain management and interventional radiology referrals - He asked if he "went to talk to someone" if [name] would agree to continue anxiety medications.  I advised that patient needed to see psychiatry and that they would take over treatment of anxiety. I once again told him that [name] will be weaning him off of the Xanax and he finally stated that we can refer to psychiatry.  Patient disconnected call before I was able to fully complete sentence that advised him that we will work on referrals and let him know the status.   Referrals have been placed.  Message routed to PCP as Juluis Rainier

## 2019-04-29 NOTE — Addendum Note (Signed)
Addended by: Katina Dung on: 04/29/2019 09:02 AM   Modules accepted: Orders

## 2019-04-29 NOTE — Telephone Encounter (Signed)
Pt called in stating that he is not trying to get anyone in trouble that he thinks [name] is a great doctor. He did want to know if [name] would be willing to change his Gabapentin to something else. He states that he can [place] had discussed changing the medication before. He did want to let [name] know that he is not a "Pothead" he just did it a few times to help with his pain. He states that he will never do it again because he doesn't want to lose [name] as his provider.   Pt wanted to know if this could be sent to the pharmacy tomorrow so he can go pick up all his medications at the same time. He is having to get someone to drive him because he is having dizziness. Pt can be reached at the home # and he is aware that [name] is already gone for the day.

## 2019-04-30 ENCOUNTER — Other Ambulatory Visit: Payer: Self-pay | Admitting: *Deleted

## 2019-04-30 NOTE — Telephone Encounter (Signed)
Response is on refill request note.

## 2019-04-30 NOTE — Telephone Encounter (Signed)
LMOVM advising we can switch Gabapentin to Lyrica if patient is agreeable. PCP wanted patient to follow up with pain specialist who can control pain better with stronger medication.

## 2019-05-04 MED FILL — GABAPENTIN 300 MG CAPSULE: 300 | 30 days supply | Qty: 270 | Fill #1

## 2019-05-04 MED FILL — ESCITALOPRAM 20 MG TABLET: 20 | 30 days supply | Qty: 30 | Fill #1

## 2019-05-06 NOTE — Telephone Encounter (Signed)
Patient called back and states that he is okay with the change from Gabapentin to Lyrica.

## 2019-05-12 MED FILL — DULOXETINE HCL 60 MG CPEP: 60 | 30 days supply | Qty: 30 | Fill #0

## 2019-05-14 MED FILL — HYDROCODON-APAP 5-325: 5-325 | 15 days supply | Qty: 30 | Fill #0

## 2019-05-18 ENCOUNTER — Other Ambulatory Visit: Payer: Self-pay | Admitting: Physician Assistant

## 2019-05-18 DIAGNOSIS — M792 Neuralgia and neuritis, unspecified: Secondary | ICD-10-CM

## 2019-05-19 ENCOUNTER — Other Ambulatory Visit: Payer: Self-pay | Admitting: Physician Assistant

## 2019-05-19 ENCOUNTER — Telehealth: Payer: Self-pay | Admitting: Physician Assistant

## 2019-05-19 MED ORDER — CARISOPRODOL 350 MG PO TABS: ORAL_TABLET | ORAL | 0 refills | Status: DC

## 2019-05-19 NOTE — Telephone Encounter (Signed)
I reached out to the pt to f/up on the pain management referral. He did ask for refills on the soma and the xanax. Pt can be reached at the home # he uses [facility]

## 2019-05-19 NOTE — Telephone Encounter (Signed)
As discussed before no further fills of xanax. He was given 45 tablets last month to wean down off of them. Will grant one refill of Soma but further refills to come from his pain specialist.

## 2019-05-20 ENCOUNTER — Telehealth: Payer: Self-pay | Admitting: Physician Assistant

## 2019-05-20 ENCOUNTER — Other Ambulatory Visit: Payer: Self-pay

## 2019-05-20 ENCOUNTER — Ambulatory Visit
Admission: RE | Admit: 2019-05-20 | Discharge: 2019-05-20 | Disposition: A | Discharge status: Home / Self Care | Payer: Medicaid Other | Source: Ambulatory Visit | Attending: Physician Assistant | Admitting: Physician Assistant

## 2019-05-20 ENCOUNTER — Encounter: Payer: Self-pay | Admitting: *Deleted

## 2019-05-20 ENCOUNTER — Encounter: Payer: Self-pay | Admitting: Emergency Medicine

## 2019-05-20 DIAGNOSIS — M792 Neuralgia and neuritis, unspecified: Secondary | ICD-10-CM

## 2019-05-20 HISTORY — PX: IR RADIOLOGIST EVAL & MGMT: IMG5224

## 2019-05-20 NOTE — Telephone Encounter (Signed)
[name] from [facility] called in stating that they can't accept the referral at this time. Please advise if you have another recommendation as to who we need to send this to

## 2019-05-20 NOTE — Telephone Encounter (Signed)
He is set up with IR for assessment so hopefully they will be able to resolve some issues but in the meantime can refer to [facility].

## 2019-05-20 NOTE — Telephone Encounter (Signed)
My chart message sent to patient to find out which pain specialist accepts Medicaid

## 2019-05-20 NOTE — Telephone Encounter (Signed)
I have faxed over the referral to [facility] pain clinic

## 2019-05-21 ENCOUNTER — Telehealth: Payer: Self-pay

## 2019-05-21 ENCOUNTER — Other Ambulatory Visit: Payer: Self-pay | Admitting: Physician Assistant

## 2019-05-21 MED ORDER — ALPRAZOLAM 1 MG PO TABS: 0.5000 mg | ORAL_TABLET | Freq: Three times a day (TID) | ORAL | 0 refills | Status: DC | PRN

## 2019-05-21 MED FILL — CARISOPRODOL 350 MG TABS: 350 | 25 days supply | Qty: 77 | Fill #0

## 2019-05-21 MED FILL — ALPRAZolam 1 MG TABS: 1 | 3 days supply | Qty: 10 | Fill #0

## 2019-05-21 MED FILL — OXYCODONE-APAP 10-325: 10-325 | 15 days supply | Qty: 60 | Fill #0

## 2019-05-21 NOTE — Telephone Encounter (Signed)
I called and spoke with Mr.Cleland and advised him that only 10 pills would be called in and that no further prescriptions would be given.  I shared very frankly with him that his behavior was not acceptable. I shared that failing a urine drug screen while a provider was giving him controlled substances under a narcotic agreement was grounds for dismissal in and of itself. I also explained  to him that his demanding behavior when he called and his inability to be flexible on appointments was not appropriate. I told him that [name] went above and beyond consistently for Mr. Coker. He hated that Mr. Chuba felt like he was being shipped off because that was not his intent at all.   I told with him that given this behavior there was a potential for a dismissal going forward. Patient stated if thats what you needed to do he understood but he really hoped you would change your mind. He states that he will no longer be demanding and he will be mindful and respectful of your time and the staffs time. He asked if he could be given one additional chance. He says that he trusts you and would struggle finding a new primary care provider.

## 2019-05-21 NOTE — Telephone Encounter (Signed)
Patient called in urgently requesting that PCP send in a new prescription for Xanax. Patient is aware that PCP was weaning him off of Xanax due to significant amount of marijuana found in several UDS. He stated multiple times that he didn't think that PCP would go from quantity 45 tabs per month to none. He said that he just had surgery yesterday and that he is in a lot of pain and needs the Xanax. He was also asking about his prescription for Soma. I informed patient that PCP had sent in a prescription for that yesterday and that that was THE LAST prescription for [facility] also. Patient became very upset, stating that he feels like we do not care how he feels, that he is not a "pot head," and is not a "drug seeker." I informed patient that a referral to [facility] had been sent in and gave him the address to both clinics located in [facility] and the number to call to f/u on getting a NP appt with them. He said "I guess [name] is just shipping me off and doesn't want to deal with me anymore." I told patient that we sent a referral to pain management so that he could be assessed by a provider that could manage his pain more in depth than what we are able to provide in our office. That we were not "shipping" him off and that we want to help him deal with his pain so he can live an enjoyable life. I told patient that I would route message to PCP about one more script of Xanax, but that he would probably not be filling any new prescriptions for Xanax.

## 2019-05-21 NOTE — Telephone Encounter (Signed)
I am not trying to ship him off to another provider. We are not a chronic pain clinic and would not have continued medications regardless of whether or not he failed his UDS (multiple times). In regards to the Xanax he was told last time that it would be his last script of medication and he was given 45 tablets to help start weaning.  I will give him 10 pills to use sparingly to finish a wean but no further Rx for this will be given. I am sad to see this reaction for him as we have always worked to take very good care of him and to accommodate him in when he was demanding to be seen or to be spoken to when calling and spending hours in the office with him or via doxy to help de-escalate his anxiety levels, etc and to expedite assessment with specialists.   Estill Bamberg -- Giving his unhappiness with this matter, he is welcome to find care elsewhere. Please start process for dismissal giving patient behavior.

## 2019-05-24 NOTE — Telephone Encounter (Signed)
No more controlled medications to be given. Will monitor behavior but if there is a recurrence of rudeness to staff, etc, then he will be dismissed.

## 2019-05-31 MED FILL — BUSPIRONE HCL 7.5 MG TABS: 7.5 | 30 days supply | Qty: 60 | Fill #1

## 2019-05-31 MED FILL — ALPRAZolam 1 MG TABS: 1 | 3 days supply | Qty: 10 | Fill #0

## 2019-05-31 MED FILL — PANTOPRAZOLE SOD DR 40 MG T: 40 | 30 days supply | Qty: 30 | Fill #1

## 2019-06-02 ENCOUNTER — Telehealth: Payer: Self-pay | Admitting: Physician Assistant

## 2019-06-02 ENCOUNTER — Encounter: Payer: Self-pay | Admitting: Psychology

## 2019-06-02 MED FILL — NUCYNTA 75 MG TABS: 75 | 15 days supply | Qty: 60 | Fill #0

## 2019-06-02 NOTE — Telephone Encounter (Signed)
Pt called in asking to speak to Patina about the next steps of his care, since his visits at the specialist. That's all the detail he would give me. Pt can be reached at [phone]

## 2019-06-03 ENCOUNTER — Encounter: Payer: Self-pay | Admitting: Physician Assistant

## 2019-06-03 NOTE — Telephone Encounter (Signed)
LMOVM of current symptoms, what did the specialist recommend or their next steps in his care.

## 2019-06-07 MED FILL — GABAPENTIN 300 MG CAPSULE: 300 | 30 days supply | Qty: 270 | Fill #1

## 2019-06-09 DIAGNOSIS — Z736 Limitation of activities due to disability: Secondary | ICD-10-CM

## 2019-06-10 ENCOUNTER — Emergency Department (HOSPITAL_COMMUNITY)
Admission: EM | Admit: 2019-06-10 | Discharge: 2019-06-10 | Disposition: A | Discharge status: Home / Self Care | Payer: Medicaid Other | Attending: Emergency Medicine | Admitting: Emergency Medicine

## 2019-06-10 ENCOUNTER — Emergency Department (HOSPITAL_COMMUNITY): Payer: Medicaid Other

## 2019-06-10 DIAGNOSIS — F419 Anxiety disorder, unspecified: Secondary | ICD-10-CM | POA: Diagnosis not present

## 2019-06-10 DIAGNOSIS — Z85528 Personal history of other malignant neoplasm of kidney: Secondary | ICD-10-CM | POA: Diagnosis not present

## 2019-06-10 DIAGNOSIS — M791 Myalgia, unspecified site: Secondary | ICD-10-CM | POA: Diagnosis present

## 2019-06-10 DIAGNOSIS — R52 Pain, unspecified: Secondary | ICD-10-CM

## 2019-06-10 DIAGNOSIS — G8929 Other chronic pain: Secondary | ICD-10-CM | POA: Insufficient documentation

## 2019-06-10 DIAGNOSIS — Z905 Acquired absence of kidney: Secondary | ICD-10-CM | POA: Diagnosis not present

## 2019-06-10 DIAGNOSIS — D126 Benign neoplasm of colon, unspecified: Secondary | ICD-10-CM | POA: Insufficient documentation

## 2019-06-10 DIAGNOSIS — Z79899 Other long term (current) drug therapy: Secondary | ICD-10-CM | POA: Diagnosis not present

## 2019-06-10 DIAGNOSIS — R1010 Upper abdominal pain, unspecified: Secondary | ICD-10-CM | POA: Diagnosis not present

## 2019-06-10 DIAGNOSIS — M549 Dorsalgia, unspecified: Secondary | ICD-10-CM | POA: Diagnosis not present

## 2019-06-10 DIAGNOSIS — F17210 Nicotine dependence, cigarettes, uncomplicated: Secondary | ICD-10-CM | POA: Diagnosis not present

## 2019-06-10 DIAGNOSIS — R0789 Other chest pain: Secondary | ICD-10-CM | POA: Diagnosis not present

## 2019-06-10 LAB — CBC WITH DIFFERENTIAL/PLATELET
Basophils Absolute: 0 10*3/uL (ref 0.0–0.1)
Basophils Relative: 0 %
Eosinophils Absolute: 0.2 10*3/uL (ref 0.0–0.5)
Eosinophils Relative: 4 %
HCT: 48.1 % (ref 39.0–52.0)
Hemoglobin: 15.9 g/dL (ref 13.0–17.0)
Lymphocytes Relative: 29 %
Lymphs Abs: 2 10*3/uL (ref 0.7–4.0)
MCH: 33.1 pg (ref 26.0–34.0)
MCHC: 33.1 g/dL (ref 30.0–36.0)
MCV: 100 fL (ref 80.0–100.0)
Monocytes Absolute: 0.6 10*3/uL (ref 0.1–1.0)
Monocytes Relative: 9 %
Neutro Abs: 4 10*3/uL (ref 1.7–7.7)
Neutrophils Relative %: 58 %
Platelets: 201 10*3/uL (ref 150–400)
RBC: 4.81 MIL/uL (ref 4.22–5.81)
RDW: 13.2 % (ref 11.5–15.5)
WBC: 6.8 10*3/uL (ref 4.0–10.5)

## 2019-06-10 LAB — COMPREHENSIVE METABOLIC PANEL
ALT: 25 U/L (ref 0–44)
AST: 21 U/L (ref 15–41)
Albumin: 3.9 g/dL (ref 3.5–5.0)
Alkaline Phosphatase: 55 U/L (ref 38–126)
Anion gap: 11 (ref 5–15)
BUN: 17 mg/dL (ref 6–20)
CO2: 24 mmol/L (ref 22–32)
Calcium: 9.7 mg/dL (ref 8.9–10.3)
Chloride: 105 mmol/L (ref 98–111)
Creatinine, Ser: 0.96 mg/dL (ref 0.61–1.24)
GFR calc Af Amer: >60 mL/min (ref >60)
GFR calc non Af Amer: >60 mL/min (ref >60)
Glucose, Bld: 93 mg/dL (ref 70–99)
Potassium: 4.2 mmol/L (ref 3.5–5.1)
Sodium: 140 mmol/L (ref 135–145)
Total Bilirubin: 0.7 mg/dL (ref 0.3–1.2)
Total Protein: 6.9 g/dL (ref 6.5–8.1)

## 2019-06-10 LAB — LIPASE, BLOOD: Lipase: 45 U/L (ref 11–51)

## 2019-06-10 MED FILL — DULOXETINE HCL 60 MG CPEP: 60 | 30 days supply | Qty: 30 | Fill #0
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
## 2019-06-10 NOTE — ED Triage Notes (Signed)
See downtime documentation 

## 2019-06-11 MED FILL — NUCYNTA 75 MG TABS: 75 | 2 days supply | Qty: 10 | Fill #0

## 2019-06-14 MED FILL — CARISOPRODOL 350 MG TABS: 350 | 4 days supply | Qty: 13 | Fill #1

## 2019-06-14 MED FILL — NUCYNTA 75 MG TABS: 75 | 13 days supply | Qty: 50 | Fill #1

## 2019-06-16 ENCOUNTER — Telehealth: Payer: Self-pay | Admitting: *Deleted

## 2019-06-16 ENCOUNTER — Other Ambulatory Visit: Payer: Self-pay | Admitting: Physician Assistant

## 2019-06-16 NOTE — Telephone Encounter (Addendum)
Patient called in and said that he is hurting so bad that he cannot stand it. He states that he is seeing pain management but nothing is helping with the pain.  He states that he has had nerves burned, epidural shots, pain pills - but he states that he is only getting worse. He states that he was at the ER again and they find nothing on his scans and they think he is just there to get pain meds.  He insists that he is not trying to get pain meds but he wants someone that can find what is wrong inside of him.  He states that he really wants to talk to someone and really wants to know if there is someone that could do an exploratory surgery because he knows something is wrong with him.  He became tearful and states that he needs help and needs to talk to someone because he is getting "worse and worse"   States that he is throwing up for an hour at a time and that he is very dehydrated and dizzy and just sick!  Patient was advised that we could not prescribe pain medications as patient sees pain management. He states that he called them 4 hours ago and has not had a return call yet.  States that they always tell him to call his PCP.

## 2019-06-16 NOTE — Telephone Encounter (Signed)
Unfortunately I have sent him to multiple specialists regarding this, none of which felt an exploratory surgery was indicated. Scans, labs, etc have been normal. He could see if his Pain specialist has recommendations for a surgeon that may be willing to do this as they would be more familiar with what is done in these situations, but I have no further referrals to place that I feel would get him any closer to his goal of an exploratory surgery. He is on pain medication from his specialist and no further pain medications will be given from this practice as previously discussed and as he has a current pain contract with pain specialist.      I do again recommend for him to follow-up with Psychiatry to help with the levels of anxiety, etc with current issues.

## 2019-06-17 MED FILL — levETIRAcetam 250 MG TABS: 250 | 30 days supply | Qty: 60 | Fill #0

## 2019-06-17 NOTE — Telephone Encounter (Signed)
Spoke with patient and advised him of PCP recommendations.  Patient states he went to the ER ([facility]) last night due to pain and vomiting. He states he went to find out what is causing the sever pain he is having. Patient states he was advised by the ED doctor that he has a "free floating screw near a rib" that could be causing his pain. He wanted to be referred to this provider for evaluation and surgery to remove the screw.   Patient also states that the ER checked his urine and he was clean. In reviewing patient chart, they did not check a urine drug screen. Patient states he has an appointment schedule with the Psychiatrist but is is not until Dec 3.  He is currently seeing the Psychologist but they are unable to prescribe medication for his PTSD.  Patient was asking to be restarted on the Xanax for his PTSD. I advised he violated his contract and that it was up to his PCP to determine.

## 2019-06-18 NOTE — Telephone Encounter (Signed)
Reviewed his ER stay in detail. Per the ER MD note he was referred to a specialist (rib surgeon) in [place] for further assessment to see if the screw noted could potentially be causing issue -- has been free floating since initial surgery and unchanged per recent imaging. Feel that if the ER physician feels this specialist is a good option then we should certainly proceed with it as no other surgeon has felt it was related to pain thus far. Can we call the specialist office to see if they truly received a referral to the surgeon (see information below). We will see if we can expedite this appointment for him. Please let him know that we are working on this for him.       In regards to medications for anxiety and PTSD, we need to check on his referral to Psychiatry to see if we can expedite appt for him due to deterioration in mood/anxiety. I would not restart the Alprazolam but there are several other options for medications to start that will help him with levels of anxiety and that are also safer for him to take alongside his pain medications. Let me know if he is willing to start other options and we can proceed.    Todd Revel, MD [place] [id number] [phone]

## 2019-06-22 ENCOUNTER — Other Ambulatory Visit: Payer: Self-pay

## 2019-06-22 ENCOUNTER — Ambulatory Visit (INDEPENDENT_AMBULATORY_CARE_PROVIDER_SITE_OTHER): Payer: Medicaid Other | Admitting: Physician Assistant

## 2019-06-22 ENCOUNTER — Encounter: Payer: Self-pay | Admitting: Physician Assistant

## 2019-06-22 DIAGNOSIS — F515 Nightmare disorder: Secondary | ICD-10-CM

## 2019-06-22 DIAGNOSIS — R0789 Other chest pain: Secondary | ICD-10-CM | POA: Diagnosis not present

## 2019-06-22 DIAGNOSIS — F4310 Post-traumatic stress disorder, unspecified: Secondary | ICD-10-CM | POA: Diagnosis not present

## 2019-06-22 DIAGNOSIS — T84039A Mechanical loosening of unspecified internal prosthetic joint, initial encounter: Secondary | ICD-10-CM

## 2019-06-22 MED ORDER — ALPRAZOLAM 1 MG PO TABS: 0.5000 mg | ORAL_TABLET | Freq: Three times a day (TID) | ORAL | 0 refills | Status: DC | PRN

## 2019-06-22 MED ORDER — PRAZOSIN HCL 1 MG PO CAPS: 1.0000 mg | ORAL_CAPSULE | Freq: Every day | ORAL | 0 refills | Status: DC
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
## 2019-06-22 NOTE — Patient Instructions (Signed)
Instructions sent to MyChart.   Please continue the Duloxetine at current dose, along with the BuSpar. I have added on the Prazosin 1 mg to take at night for the next 3 nights. Once tolerating well, increase to 2 tablets (2 mg) at night.  I have sent in a refill on the Alprazolam to use as directed if needed for acute anxiety only.  Continue with counseling. I am working on expediting your appointment with Psychiatry.   We have sent referral to the specialist recommended by the ER provider you saw.  We will try to get you in ASAP.  Continue current pain medication regimen for now.   Hang in there! We will update you as soon as we hear from the specialists.

## 2019-06-22 NOTE — Telephone Encounter (Signed)
Discussed with patient at visit today as he was seen via virtual visit before CMA could give callback.

## 2019-06-22 NOTE — Progress Notes (Signed)
I have discussed the procedure for the virtual visit with the patient who has given consent to proceed with assessment and treatment.   Henriette Hesser S Loella Hickle, CMA     

## 2019-06-25 ENCOUNTER — Telehealth: Payer: Self-pay | Admitting: Physician Assistant

## 2019-06-25 DIAGNOSIS — F329 Major depressive disorder, single episode, unspecified: Secondary | ICD-10-CM

## 2019-06-25 DIAGNOSIS — F419 Anxiety disorder, unspecified: Secondary | ICD-10-CM

## 2019-06-25 MED ORDER — BUSPIRONE HCL 7.5 MG PO TABS: 7.5000 mg | ORAL_TABLET | Freq: Two times a day (BID) | ORAL | 3 refills | Status: DC

## 2019-06-25 MED ORDER — DULOXETINE HCL 30 MG PO CPEP: 30.0000 mg | ORAL_CAPSULE | Freq: Two times a day (BID) | ORAL | 3 refills | Status: DC

## 2019-06-25 MED ORDER — HYDROCODONE-ACETAMINOPHEN 5-325 MG PO TABS: 1.0000 | ORAL_TABLET | Freq: Three times a day (TID) | ORAL | 0 refills | Status: DC | PRN

## 2019-06-25 NOTE — Telephone Encounter (Signed)
Pt called in stating that he is dizziness and is having abd pain. Pt said that he just pooped and his stool is very very dark looks black. Pt states that he is vomiting and in a lot of pain. He wanted to know if something can be done for the pain. Pt can be reached at the home #

## 2019-06-25 NOTE — Telephone Encounter (Signed)
Spoke with patient of PCP recommendations. He is agreeable with reducing the Gabapentin 300 mg tid to 600 mg tid, d/c Soma and Nucynta. Appointment scheduled for patient Wed 06/30/19 @ 8 am.  Advised patient we will check on the status of referral to Dr Raul Del for appointment.

## 2019-06-25 NOTE — Telephone Encounter (Signed)
Patient states his girlfriend looked at his bowel movements and it was not black it was blue. He was eating blueberries. 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  He does complain of severe dizziness, headache, his pain is severe, and fatigue. Increased falling 2-3 times a day, difficulty walking He is out of his Nucynta medication which did not help. His pain doctor/Neurosurgeon refuses to see him/dismissed him The surgeon Dr Ludwig Lean who performed the surgery will not removed the screw He states the pain medication he took from PCP was helping better  He states his Cymbalta is 60 mg Needs refill on Buspar Out of Soma

## 2019-06-25 NOTE — Telephone Encounter (Signed)
The symptoms are ongoing with negative workup thus far except for a loose screw from prior rib surgery as potential cause of the ongoing chest pain. He is being set up with a different specialist that was recommended by ER provider (please check on status of this). The dizziness and fatigue are also chronic and suspect all related to his PTSD as other causes have been ruled out in the ER and with specialists.   The falls are concerning to me as this is the first mention of these symptoms to me. Are these related to episodes of anxiety and dizziness or seemingly unrelated to him? We may need to reduce his Gabapentin further -- down from 800 TID to 600 mg TID for now. Want to see him in office next week to assess further. If anything worsening needs to go to the ER.  I do want him to continue the medications for anxiety and PTSD as directed. We have to be very careful with pain medications in general but especially with the dizziness -- do not want anything to worsen this. I think a lot of that is anxiety 2/2 pain level causing dizziness. I see no further need at present for the SOMA. We will change the Tapentadol back to hydrocodone short-term while getting him in with specialist. I will send in 5 day supply for him to [facility] in [place].

## 2019-06-25 NOTE — Telephone Encounter (Signed)
Sorry Gabapentin 900 mg tid

## 2019-06-25 NOTE — Telephone Encounter (Signed)
Patina please call patient -- if he is truly having black stool and dizziness he needs to be seen at least at an Urgent Care to make sure there is no sign of bleeding.

## 2019-06-25 NOTE — Addendum Note (Signed)
Addended by: Leonidas Romberg on: 06/25/2019 05:05 PM   Modules accepted: Orders

## 2019-06-27 ENCOUNTER — Other Ambulatory Visit: Payer: Self-pay

## 2019-06-27 ENCOUNTER — Emergency Department (HOSPITAL_COMMUNITY): Payer: Medicaid Other

## 2019-06-27 ENCOUNTER — Encounter (HOSPITAL_COMMUNITY): Payer: Self-pay | Admitting: Emergency Medicine

## 2019-06-27 ENCOUNTER — Emergency Department (HOSPITAL_COMMUNITY)
Admission: EM | Admit: 2019-06-27 | Discharge: 2019-06-27 | Disposition: A | Discharge status: Home / Self Care | Payer: Medicaid Other | Attending: Emergency Medicine | Admitting: Emergency Medicine

## 2019-06-27 DIAGNOSIS — S93402A Sprain of unspecified ligament of left ankle, initial encounter: Secondary | ICD-10-CM | POA: Diagnosis not present

## 2019-06-27 DIAGNOSIS — Y92007 Garden or yard of unspecified non-institutional (private) residence as the place of occurrence of the external cause: Secondary | ICD-10-CM | POA: Diagnosis not present

## 2019-06-27 DIAGNOSIS — Y93K9 Activity, other involving animal care: Secondary | ICD-10-CM | POA: Diagnosis not present

## 2019-06-27 DIAGNOSIS — R55 Syncope and collapse: Secondary | ICD-10-CM | POA: Diagnosis not present

## 2019-06-27 DIAGNOSIS — I10 Essential (primary) hypertension: Secondary | ICD-10-CM | POA: Insufficient documentation

## 2019-06-27 DIAGNOSIS — S99912A Unspecified injury of left ankle, initial encounter: Secondary | ICD-10-CM | POA: Diagnosis present

## 2019-06-27 DIAGNOSIS — X58XXXA Exposure to other specified factors, initial encounter: Secondary | ICD-10-CM | POA: Insufficient documentation

## 2019-06-27 DIAGNOSIS — Z79899 Other long term (current) drug therapy: Secondary | ICD-10-CM | POA: Diagnosis not present

## 2019-06-27 DIAGNOSIS — F17210 Nicotine dependence, cigarettes, uncomplicated: Secondary | ICD-10-CM | POA: Insufficient documentation

## 2019-06-27 DIAGNOSIS — Y999 Unspecified external cause status: Secondary | ICD-10-CM | POA: Insufficient documentation

## 2019-06-27 HISTORY — DX: Essential (primary) hypertension: I10

## 2019-06-27 LAB — BASIC METABOLIC PANEL
Anion gap: 9 (ref 5–15)
BUN: 13 mg/dL (ref 6–20)
CO2: 23 mmol/L (ref 22–32)
Calcium: 8.7 mg/dL — ABNORMAL LOW (ref 8.9–10.3)
Chloride: 108 mmol/L (ref 98–111)
Creatinine, Ser: 1 mg/dL (ref 0.61–1.24)
GFR calc Af Amer: >60 mL/min (ref >60)
GFR calc non Af Amer: >60 mL/min (ref >60)
Glucose, Bld: 94 mg/dL (ref 70–99)
Potassium: 3.8 mmol/L (ref 3.5–5.1)
Sodium: 140 mmol/L (ref 135–145)

## 2019-06-27 LAB — CBC
HCT: 44.3 % (ref 39.0–52.0)
Hemoglobin: 15.2 g/dL (ref 13.0–17.0)
MCH: 33.5 pg (ref 26.0–34.0)
MCHC: 34.3 g/dL (ref 30.0–36.0)
MCV: 97.6 fL (ref 80.0–100.0)
Platelets: 212 10*3/uL (ref 150–400)
RBC: 4.54 MIL/uL (ref 4.22–5.81)
RDW: 12.6 % (ref 11.5–15.5)
WBC: 7.9 10*3/uL (ref 4.0–10.5)
nRBC: 0 % (ref 0.0–0.2)

## 2019-06-27 LAB — CBG MONITORING, ED: Glucose-Capillary: 81 mg/dL (ref 70–99)

## 2019-06-27 MED ORDER — HYDROMORPHONE HCL 1 MG/ML IJ SOLN
1.0000 mg | Freq: Once | INTRAMUSCULAR | Status: AC
  Administered 2019-06-27: 17:00:00 1 mg via INTRAVENOUS
  Filled 2019-06-27: qty 1

## 2019-06-27 MED ORDER — HYDROCODONE-ACETAMINOPHEN 5-325 MG PO TABS: 1.0000 | ORAL_TABLET | Freq: Four times a day (QID) | ORAL | 0 refills | Status: DC | PRN

## 2019-06-27 MED ORDER — HYDROCODONE-ACETAMINOPHEN 5-325 MG PO TABS
2.0000 | ORAL_TABLET | Freq: Once | ORAL | Status: AC
  Administered 2019-06-27: 20:00:00 2 via ORAL
  Filled 2019-06-27: qty 2

## 2019-06-27 NOTE — Progress Notes (Signed)
Orthopedic Tech Progress Note Patient Details:  [name] April 15, 1976 [id number]  Ortho Devices Type of Ortho Device: Crutches, CAM walker Ortho Device/Splint Location: lle Ortho Device/Splint Interventions: Ordered, Application, Adjustment   Post Interventions Patient Tolerated: Fair Instructions Provided: Care of device, Adjustment of device   [name] 06/27/2019, 10:03 PM

## 2019-06-27 NOTE — ED Notes (Signed)
Ortho tech at bedside 

## 2019-06-27 NOTE — Discharge Instructions (Signed)
1.  Follow-up with your doctor as soon as possible ongoing evaluation of chronic pain and referral to pain management. 2.  Follow-up with the orthopedic doctor for ankle sprain as outlined.  Elevate and ice your ankle as much as possible.  Use the provided walking boot and crutches.

## 2019-06-27 NOTE — ED Notes (Signed)
C/o months hx of dizziness and intermittent pain to L side chest and abdomen.  States he has titanium plates and has reported metal screw is loose x 3 1/2 years and noone is willing to remove it.  Denies any heart problems or HTN.   Pt has extreme pain responses to chest and L ankle.  [place] today after getting dizzy and twisted ankle.

## 2019-06-27 NOTE — ED Triage Notes (Signed)
Pt BIB EMS for fall and ankle swelling. Pt was outside feeding his dogs, got dizzy and fell onto L side. L ankle is swollen, pt also c/o of L rib, shoulder and back pain. Pt is unable to bear weight on L foot. Pt reports having dizzy spells and falls that had progressively become more frequent over the past 3 months.
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
## 2019-06-27 NOTE — ED Notes (Signed)
Awaiting ortho tech for post op boot and crutches.

## 2019-06-30 ENCOUNTER — Ambulatory Visit: Payer: Medicaid Other | Admitting: Physician Assistant

## 2019-07-01 ENCOUNTER — Telehealth: Payer: Self-pay

## 2019-07-01 NOTE — Telephone Encounter (Signed)
Patient called in stating that he had fallen earlier this week and that he is unable to make it into the office for an appt, could he do a virtual visit.   I informed patient that today is Cody's half day and that he is out of the office all of next week. I told patient that I would route message to PCP to see if we could use an appt tomorrow for a VV, but that patient has had several VV with PCP and that our providers like to have in office visits every so often in between numerous VV.   Patient was called on Wednesday afternoon for visit screening, left voicemail, patient returned call and went over screening questions with Jilda Roche and never stated that he would not be able to come into the office for his appt.   Please advise.

## 2019-07-01 NOTE — Telephone Encounter (Signed)
Spoke with patient, has been scheduled VV for tomorrow and will need to schedule in office appt once PCP returns.

## 2019-07-01 NOTE — Telephone Encounter (Signed)
Patient no-showed for visit yesterday having just been contacted to confirm the in-office visit the afternoog before (after his incident with ankle). This appointment is still to be marked as a no show. He needs in office assessment giving the issues being addressed, including reassessment of his sprained ankle. Would really recommend an in-office visit and can do tomorrow at 10:00. Otherwise he can do a VV at that time tomorrow but want to see him in-office once I return (out next week)

## 2019-07-02 ENCOUNTER — Telehealth: Payer: Self-pay | Admitting: Physician Assistant

## 2019-07-02 ENCOUNTER — Encounter: Payer: Medicaid Other | Admitting: Physician Assistant

## 2019-07-02 MED FILL — NUCYNTA 75 MG TABS: 75 | 12 days supply | Qty: 45 | Fill #0

## 2019-07-02 NOTE — Telephone Encounter (Signed)
Pt called in stating that he missed his appt today due to this phone not ringing. Pt asked if he needed refills on his medication while [name] was on vacation would that take place. I made pt aware that the other provider could refill the medications in Cody's absence, however they don't topically refill controlled medications. Pt asked if I could call [name] and get a refill on his pain medications. I told the pt no that [name] was already gone for the day and that he would have to request the refill when needed.

## 2019-07-02 NOTE — Progress Notes (Signed)
Called patient and left message on voicemail that I was calling to start Video visit.

## 2019-07-04 NOTE — Progress Notes (Signed)
This encounter was created in error - please disregard.

## 2019-07-05 ENCOUNTER — Other Ambulatory Visit: Payer: Self-pay | Admitting: Physician Assistant

## 2019-07-05 ENCOUNTER — Telehealth: Payer: Self-pay | Admitting: *Deleted

## 2019-07-05 DIAGNOSIS — G894 Chronic pain syndrome: Secondary | ICD-10-CM

## 2019-07-05 NOTE — Telephone Encounter (Signed)
Patient's fiance called in and states that patient does not know she is calling and she does not want him to know she called because she does not want patient to be upset.  She states that patient was asleep on her couch Saturday evening and she heard him making a gurgling sound. She states that she went into the living room and he was sleeping but had vomit in his mouth and he was choking on it. She said she rolled him over on his side and it woke him up.   She states that she just wants someone to find him some help because no one seems to know what is wrong with him.  She stated that she was not on any DPR and she knows that we cannot call her back to give her information, but she wanted to make [place] aware of what was happening.

## 2019-07-06 MED FILL — PANTOPRAZOLE SOD DR 40 MG T: 40 | 30 days supply | Qty: 30 | Fill #2

## 2019-07-06 MED FILL — DULOXETINE HCL 60 MG CPEP: 60 | 30 days supply | Qty: 30 | Fill #0

## 2019-07-06 NOTE — Telephone Encounter (Signed)
Last Filled 04/17/19 #270 with 1. Is pt to take every day?

## 2019-07-06 NOTE — Telephone Encounter (Signed)
Appointment has been scheduled for Monday at [facility]. Patient is aware that he needs to make sure his phone is on and that he is available since he has no showed the last 2 appointments.

## 2019-07-06 NOTE — Telephone Encounter (Signed)
Patient no showed for 2 appointments last week. Make sure he is scheduled for a follow up with me so we can address things. If anything recurs or if he develops congestion, fever etc he needs evaluation as there would be concern for aspiration pneumonia based on information given

## 2019-07-07 ENCOUNTER — Other Ambulatory Visit: Payer: Self-pay | Admitting: General Practice

## 2019-07-07 ENCOUNTER — Other Ambulatory Visit: Payer: Self-pay

## 2019-07-07 MED ORDER — GABAPENTIN 100 MG PO CAPS: 100.0000 mg | ORAL_CAPSULE | Freq: Three times a day (TID) | ORAL | 3 refills | Status: DC

## 2019-07-07 MED ORDER — GABAPENTIN 300 MG PO CAPS: 300.0000 mg | ORAL_CAPSULE | Freq: Three times a day (TID) | ORAL | 1 refills | Status: DC

## 2019-07-07 NOTE — Telephone Encounter (Signed)
Pt called in asking if we send in the [facility] to [facility] on [address] in [place]. Pt can be reached at the home # He is out of the medication.   Can T[facility] take care of this in cody's absents?

## 2019-07-07 NOTE — Telephone Encounter (Signed)
Please advise 

## 2019-07-07 NOTE — Telephone Encounter (Signed)
[place] for #270, no refills.  [facility] [place]

## 2019-07-07 NOTE — Telephone Encounter (Signed)
Med was already filled.

## 2019-07-12 ENCOUNTER — Encounter: Payer: Self-pay | Admitting: Physician Assistant

## 2019-07-12 ENCOUNTER — Ambulatory Visit (INDEPENDENT_AMBULATORY_CARE_PROVIDER_SITE_OTHER): Payer: Medicaid Other | Admitting: Physician Assistant

## 2019-07-12 DIAGNOSIS — S93402D Sprain of unspecified ligament of left ankle, subsequent encounter: Secondary | ICD-10-CM

## 2019-07-12 DIAGNOSIS — F329 Major depressive disorder, single episode, unspecified: Secondary | ICD-10-CM

## 2019-07-12 DIAGNOSIS — G8912 Acute post-thoracotomy pain: Secondary | ICD-10-CM

## 2019-07-12 DIAGNOSIS — F419 Anxiety disorder, unspecified: Secondary | ICD-10-CM | POA: Diagnosis not present

## 2019-07-12 DIAGNOSIS — F4310 Post-traumatic stress disorder, unspecified: Secondary | ICD-10-CM

## 2019-07-12 DIAGNOSIS — F32A Depression, unspecified: Secondary | ICD-10-CM

## 2019-07-12 MED ORDER — ALPRAZOLAM 1 MG PO TABS: 0.5000 mg | ORAL_TABLET | Freq: Three times a day (TID) | ORAL | 0 refills | Status: DC | PRN

## 2019-07-12 NOTE — Patient Instructions (Signed)
Instructions sent to MyChart.   Please keep hydrated and get plenty of rest. Continue current regimen for now with the following exception: increase the Prazosin to 2 mg nightly over the next couple of nights to see if this helps further with sleep disturbances. Let me know how this is working.  I will keep an eye out for notes from the specialist.  Hang in there!
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
## 2019-07-13 ENCOUNTER — Telehealth: Payer: Self-pay | Admitting: *Deleted

## 2019-07-13 NOTE — Telephone Encounter (Signed)
The provider's note is not completed so I will want to review this in entirety.  In terms of seeing a pain specialist we can set this up from him here in the area as that is not something I manage long-term. Ok to place referral for him.  In terms of anxiety and such we will continue to work on this with medication, have him continue counseling and see if we can call the Psychiatrist to expedite an appointment for him. I am hopeful that now that multiple providers have told him pain is stemming from prior surgery (inappropriate firing of nerves) instead of from his implants then we can focus on pain control for him.

## 2019-07-13 NOTE — Telephone Encounter (Signed)
Patient also requested after notes are reviewed if he could schedule a virtual appointment with PCP to discuss

## 2019-07-13 NOTE — Telephone Encounter (Signed)
Patient called in to let us know that he saw the surgeon today and was told there is nothing that can be done for him.  States that he was told no one should ever operate on him again. He was told that he should continue to take Gabapentin 3 tablets 3x's daily, Xanax for PTSD and see someone that can make him comfortable. He also states that he was told the dizziness is coming from the pain and that this will not improve until his pain is under control.  States that he was told to see PCP and an anesthesiologist regarding pain. He states that a referral was made for him today but that he does not feel like he can go to [facility] for all these appointments and wants to know if this is something that [name] can help him manage or find someone closer.   He states that he was also told today that he should not drive until symptoms are controlled, and that he will never work again.   Stated that he was told no need to follow-up there, just that he needed to discuss with PCP.

## 2019-07-15 ENCOUNTER — Other Ambulatory Visit: Payer: Self-pay | Admitting: Orthopedic Surgery

## 2019-07-15 DIAGNOSIS — M1612 Unilateral primary osteoarthritis, left hip: Secondary | ICD-10-CM

## 2019-07-15 NOTE — Telephone Encounter (Signed)
Patient has scheduled an appointment to discuss

## 2019-07-16 ENCOUNTER — Other Ambulatory Visit: Payer: Self-pay | Admitting: Physician Assistant

## 2019-07-16 ENCOUNTER — Telehealth: Payer: Self-pay | Admitting: *Deleted

## 2019-07-16 MED ORDER — HYDROCODONE-ACETAMINOPHEN 5-325 MG PO TABS: 1.0000 | ORAL_TABLET | Freq: Three times a day (TID) | ORAL | 0 refills | Status: DC | PRN
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
## 2019-07-16 NOTE — Telephone Encounter (Signed)
I  Have emailed you the dismissal form. I will take complete the letter and route it to you to sign.

## 2019-07-16 NOTE — Telephone Encounter (Signed)
Patient called in saying that he is in a lot of pain and needs help.  He states that he talked with the physician he saw about his sprained ankle and they will not give him any more pain medicine. He states that they told him he would have to contact his PCP.  He states that all specialists that he has seen has turned him back over to PCP saying there is nothing they can do for him.  Patient started crying and said he just needs help. Advised patient that hehas an appointment on Monday and he states that he understands about the appointment but that between now and then is too long to hurt like this. He was aware that [name] did not have any appointments for Friday, but begged me to please send a message to [place] asking for something to help with his pain over the weekend.  I advised that I would send a message but that I could not guarantee that we could do anything prior to his visit Monday. He is aware that we will call him back to let him know of PCP's decision.

## 2019-07-16 NOTE — Telephone Encounter (Signed)
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 I am ok sending in refill of the Hydrocodone given previously for this. A sprained ankle alone is not reason for opioid pain medication so that is why the orthopedist is declining any further medication. I have sent a refill for the weekend and we can discuss things in more detail at appointment Monday.

## 2019-07-16 NOTE — Telephone Encounter (Signed)
Attempted to call patient to let him know that we will not be sending in any additional medication. No answer, but I did leave a message on cell VM (okay per DPR) Advised that we have record where multiple medications have been filled.   Advised that he call me back to discuss.

## 2019-07-16 NOTE — Telephone Encounter (Signed)
Pt called in asking for a call back. Pt states that he is in a lot of pain. He would like some help he doesn't know what to do. Pt can be reached at the home #

## 2019-07-16 NOTE — Addendum Note (Signed)
Addended by: Raiford Noble C on: 07/16/2019 12:12 PM   Modules accepted: Orders

## 2019-07-16 NOTE — Telephone Encounter (Signed)
Patient called back again before I was able to reach out to him.  This is the 3rd time in less than 24 hours that he has called.  I went through the notes below from the provider letting him know that medication was called in to get him through the weekend and that anything further will have to wait until his appointment on Monday.   He states that the Hydrocodone is not touching his pain and that he needs something different.  Advised that I cannot do anything different until he sees [place] on Monday. He states that his BP is elevated after taking it twice.  Advised patient that he cannot continuously take BP in the same arm repeatedly because it will increase - especially with his level of pain and anxiety.  He states that he was in the ER again last night taken by ambulance because he felt so bad and his BP was elevated. He states that there was nothing they could do for him that he needed to follow up with PCP.  Patient states that his blood pressure is 178/130 (after taking it twice back to back).  He states that he is just in so much pain and shaking.   He repeatedly said that he just needed to call us to tell us what was going on because he needs to know what to do. He said he cannot go back to the ER because there is nothing they can do for him. He repeatedly stated that he is not trying to be pushy, but that something needs done.

## 2019-07-17 ENCOUNTER — Other Ambulatory Visit: Payer: Self-pay | Admitting: Physician Assistant

## 2019-07-17 DIAGNOSIS — F419 Anxiety disorder, unspecified: Secondary | ICD-10-CM

## 2019-07-17 DIAGNOSIS — F329 Major depressive disorder, single episode, unspecified: Secondary | ICD-10-CM

## 2019-07-19 ENCOUNTER — Other Ambulatory Visit: Payer: Self-pay | Admitting: Physician Assistant

## 2019-07-19 ENCOUNTER — Ambulatory Visit (INDEPENDENT_AMBULATORY_CARE_PROVIDER_SITE_OTHER): Payer: Medicaid Other | Admitting: Physician Assistant

## 2019-07-19 ENCOUNTER — Other Ambulatory Visit: Payer: Self-pay

## 2019-07-19 ENCOUNTER — Encounter: Payer: Self-pay | Admitting: Physician Assistant

## 2019-07-19 ENCOUNTER — Ambulatory Visit
Admission: RE | Admit: 2019-07-19 | Discharge: 2019-07-19 | Disposition: A | Discharge status: Home / Self Care | Payer: Medicaid Other | Source: Ambulatory Visit | Attending: Orthopedic Surgery | Admitting: Orthopedic Surgery

## 2019-07-19 DIAGNOSIS — Z202 Contact with and (suspected) exposure to infections with a predominantly sexual mode of transmission: Secondary | ICD-10-CM

## 2019-07-19 DIAGNOSIS — M1612 Unilateral primary osteoarthritis, left hip: Secondary | ICD-10-CM

## 2019-07-19 DIAGNOSIS — G8912 Acute post-thoracotomy pain: Secondary | ICD-10-CM

## 2019-07-19 DIAGNOSIS — F4310 Post-traumatic stress disorder, unspecified: Secondary | ICD-10-CM | POA: Diagnosis not present

## 2019-07-19 DIAGNOSIS — G894 Chronic pain syndrome: Secondary | ICD-10-CM

## 2019-07-19 MED ORDER — XTAMPZA ER 13.5 MG PO C12A: 1.0000 | EXTENDED_RELEASE_CAPSULE | Freq: Two times a day (BID) | ORAL | 0 refills | Status: DC | PRN

## 2019-07-19 MED ORDER — METHYLPREDNISOLONE ACETATE 40 MG/ML INJ SUSP (RADIOLOG
120.0000 mg | Freq: Once | INTRAMUSCULAR | Status: AC
  Administered 2019-07-19: 120 mg via INTRA_ARTICULAR

## 2019-07-19 MED ORDER — IOPAMIDOL (ISOVUE-M 200) INJECTION 41%
1.0000 mL | Freq: Once | INTRAMUSCULAR | Status: AC
  Administered 2019-07-19: 1 mL via INTRA_ARTICULAR

## 2019-07-19 NOTE — Progress Notes (Signed)
I have discussed the procedure for the virtual visit with the patient who has given consent to proceed with assessment and treatment.   Aftan Vint S Susa Bones, CMA     

## 2019-07-19 NOTE — Patient Instructions (Signed)
Instructions sent to MyChart.  Please come in tomorrow as scheduled for nurse visit.  I sent in a new medication for pain -- Xtampza -- every 12 hours. I have sent in a 7 day supply of this to see how it works. We need to follow-up in 1 week in-office.   Take 2 of the Prazosin tonight. Check BP tomorrow and record.  Let us know what this is at visit tomorrow. We will have Joellen Jersey also check BP while you are here. We will adjust medication accordingly.   I will work on expediting a pain management referral and your appointment with Psychiatry.
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
## 2019-07-20 ENCOUNTER — Other Ambulatory Visit (HOSPITAL_COMMUNITY)
Admission: RE | Admit: 2019-07-20 | Discharge: 2019-07-20 | Disposition: A | Discharge status: Home / Self Care | Payer: Medicaid Other | Source: Ambulatory Visit | Attending: Physician Assistant | Admitting: Physician Assistant

## 2019-07-20 ENCOUNTER — Other Ambulatory Visit: Payer: Self-pay | Admitting: Physician Assistant

## 2019-07-20 ENCOUNTER — Ambulatory Visit (INDEPENDENT_AMBULATORY_CARE_PROVIDER_SITE_OTHER): Payer: Medicaid Other

## 2019-07-20 DIAGNOSIS — Z202 Contact with and (suspected) exposure to infections with a predominantly sexual mode of transmission: Secondary | ICD-10-CM

## 2019-07-20 DIAGNOSIS — Z23 Encounter for immunization: Secondary | ICD-10-CM

## 2019-07-20 DIAGNOSIS — Z0289 Encounter for other administrative examinations: Secondary | ICD-10-CM

## 2019-07-21 ENCOUNTER — Other Ambulatory Visit: Payer: Self-pay | Admitting: Emergency Medicine

## 2019-07-21 ENCOUNTER — Telehealth: Payer: Self-pay

## 2019-07-21 DIAGNOSIS — Z0289 Encounter for other administrative examinations: Secondary | ICD-10-CM

## 2019-07-21 DIAGNOSIS — R0789 Other chest pain: Secondary | ICD-10-CM

## 2019-07-21 DIAGNOSIS — R030 Elevated blood-pressure reading, without diagnosis of hypertension: Secondary | ICD-10-CM

## 2019-07-21 LAB — URINE CYTOLOGY ANCILLARY ONLY
Chlamydia: NEGATIVE
Molecular Disclaimer: NEGATIVE
Molecular Disclaimer: NEGATIVE
Molecular Disclaimer: NORMAL
Neisseria Gonorrhea: NEGATIVE
Trichomonas: NEGATIVE

## 2019-07-21 MED ORDER — BLOOD PRESSURE MONITOR AUTOMAT DEVI: 0 refills | Status: DC

## 2019-07-21 MED ORDER — METOPROLOL SUCCINATE ER 25 MG PO TB24: 25.0000 mg | ORAL_TABLET | Freq: Every day | ORAL | 1 refills | Status: DC

## 2019-07-21 MED ORDER — XTAMPZA ER 13.5 MG PO C12A: 2.0000 | EXTENDED_RELEASE_CAPSULE | Freq: Two times a day (BID) | ORAL | 0 refills | Status: DC

## 2019-07-21 NOTE — Telephone Encounter (Signed)
Patient called in saying he needed to talk to [place] or [place]. I informed patient that neither were available, but that I was more than happy to help him. Patient stated that [name] told him send in his BP readings but he was unable to find his glasses to type on MyChart so he decided to call in. Patient stated that his current BP is 180/130 and is having chest pain in the usually location. Pain level is at a 10. Stated that he has taken one Xtampza today and 1.5 tabs of his Xanax. Patient seemed to be gasping for air and unable to process complete thoughts. After asking patient questions and making him focus on what I was asking, seemed to be breathing better. I informed patient that I would route PCP a message, and to try and lay down/relax.   Patient seen yesterday for nurse visit, BP was 150/98.

## 2019-07-21 NOTE — Telephone Encounter (Signed)
Spoke with patient about blood pressure. He has taken all his regular medications today. Started on the ER Oxycodone medication this morning. He states his pain level is a 10/10. His blood pressures has been running 190/140 with manual bp cuff, his girlfriend is checking. He took Xanax 1.5 tablet an hour later and laid down. His bp was 174/110. He denies any increased stressors. He states his pain is severe which is causing his blood pressure to become elevated.  Patient was calling to advise PCP that he took his medications as directed but was having a lot of pain. He didn't know if he needed to take a Hydrocodone.  While talking with patient on the phone. His repeat BP was 190/138 LA. He is requesting a prescription for a BP cuff monitoring device. Rx sent but is not needed.  Per PCP to increase his Xtampza (ER Oxycodone) to 2 tabs every 12 hours for pain.  Appointment scheduled for tomorrow at 9 am to follow up on pain and blood pressure. Rx for Metoprolol XR 25 mg sent to the pharmacy to start tonight.   Advised if BP becomes elevated over 190/138 he would need to be seen at an urgent care. He is agreeable. He will monitor blood pressures over night.

## 2019-07-21 NOTE — Telephone Encounter (Signed)
LMOVM to return call to office 

## 2019-07-21 NOTE — Telephone Encounter (Signed)
I need to know how BP is being taking -- manually by someone else, electronically and if so wrist cuff or brachial cuff. What medications has he taken already today. Is pain the sole issue currently or has there been any new stressors (besides new things discussed yesterday) that he feels is contributing. He has chronic chest wall pain which we are treating. Has had multiple ER visits and hospitalizations negative for cardiac cause of the pain he is having. Think BP all related to PTSD and chronic pain. Need to tweak the dosing of pain medication and start a BP reducer today, getting him in for in-office evaluation tomorrow morning. Anything out of the ordinary or BP keeps climbing he has got to go to ER.

## 2019-07-21 NOTE — Telephone Encounter (Signed)
Patient called back and stated that his BP keeps getting higher and that its up to 190/140. Patient said he need a bp cuff and wanted to see if [name] could send one in for him. Patient also stated if he was wondering if he could take any pain medication he didn't want to take if it was going to make cody mad. Patient said he would like a call back but he was going to lay down.

## 2019-07-22 ENCOUNTER — Other Ambulatory Visit: Payer: Self-pay

## 2019-07-22 ENCOUNTER — Encounter: Payer: Self-pay | Admitting: Physician Assistant

## 2019-07-22 ENCOUNTER — Ambulatory Visit (INDEPENDENT_AMBULATORY_CARE_PROVIDER_SITE_OTHER): Payer: Medicaid Other | Admitting: Physician Assistant

## 2019-07-22 VITALS — BP 150/98 | HR 88 | Temp 98.0°F | Resp 18 | Wt 270.0 lb

## 2019-07-22 DIAGNOSIS — F32A Depression, unspecified: Secondary | ICD-10-CM

## 2019-07-22 DIAGNOSIS — G8912 Acute post-thoracotomy pain: Secondary | ICD-10-CM | POA: Diagnosis not present

## 2019-07-22 DIAGNOSIS — F329 Major depressive disorder, single episode, unspecified: Secondary | ICD-10-CM | POA: Diagnosis not present

## 2019-07-22 DIAGNOSIS — F419 Anxiety disorder, unspecified: Secondary | ICD-10-CM

## 2019-07-22 DIAGNOSIS — I10 Essential (primary) hypertension: Secondary | ICD-10-CM

## 2019-07-22 MED ORDER — XTAMPZA ER 36 MG PO C12A: 1.0000 | EXTENDED_RELEASE_CAPSULE | Freq: Two times a day (BID) | ORAL | 0 refills | Status: DC

## 2019-07-22 NOTE — Progress Notes (Signed)
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 Patient is currently on a regimen of Metoprolol XL 50 mg daily for blood pressure. Is also on Xtampza 27 mg BID. Endorses taking medications as directed. Notes BP only slightly improved but just started on medication. Denies change in pain level with the Xtampza 27 mg and as such BP has still been elevated. Denies new concerns today. Still awaiting appointment with psychiatry.   Past Medical History:  Diagnosis Date  . Anxiety and depression 08/01/2013  . Cancer ([place])   . Cellulitis    left leg and stomach  . Chicken pox   . Chronic pain   . Diarrhea 08/01/2013  . History of kidney cancer   . Hx of vasectomy   . Hypertension   . Renal cell carcinoma ([place]) 06/01/2013    Current Outpatient Medications on File Prior to Visit  Medication Sig Dispense Refill  . acetaminophen (TYLENOL) 500 MG tablet Take 1,000 mg by mouth every 6 (six) hours as needed for moderate pain.    Marland Kitchen albuterol (VENTOLIN HFA) 108 (90 Base) MCG/ACT inhaler Inhale 1-2 puffs into the lungs every 6 (six) hours as needed for wheezing or shortness of breath. 1 Inhaler 0  . Blood Pressure Monitoring (BLOOD PRESSURE MONITOR AUTOMAT) DEVI Check blood pressure during pain level. 1 kit 0  . busPIRone (BUSPAR) 7.5 MG tablet TAKE 1 TABLET BY MOUTH 2 TIMES DAILY. 180 tablet 0  . DULoxetine (CYMBALTA) 30 MG capsule TAKE 1 CAPSULE BY MOUTH 2 TIMES DAILY. 180 capsule 0  . famotidine (PEPCID) 20 MG tablet Take 1 tablet (20 mg total) by mouth 2 (two) times daily. 30 tablet 0  . gabapentin (NEURONTIN) 300 MG capsule Take 1 capsule (300 mg total) by mouth 3 (three) times daily. Take 3 capsules in the morning, 3 capsules at lunch, and 3 capsules are bedtime 270 capsule 1  . mometasone (ASMANEX, 60 METERED DOSES,) 220 MCG/INH inhaler Inhale 1 puff into the lungs 2 (two)  times daily. 1 Inhaler 12  . pantoprazole (PROTONIX) 40 MG tablet Take 1 tablet (40 mg total) by mouth daily. 30 tablet 3   No current facility-administered medications on file prior to visit.     Allergies  Allergen Reactions  . Bee Venom Shortness Of Breath and Swelling    Arm swells  . Hornet Venom Shortness Of Breath and Swelling    Arm swells    Family History  Problem Relation Age of Onset  . Cirrhosis Father   . Colitis Father   . Heart disease Father   . Asthma Father   . Other Father        Chemical Imbalance  . Heart attack Other        Paternal Grandparents  . Stroke Other        Paternal Grandparents  . Prostate cancer Paternal Grandfather   . Diabetes Maternal Grandfather   . Alcohol abuse Mother   . Other Brother        Intestinal Fissure  . Colon polyps Sister        intestinal problems  . Asthma Son   . Other Brother        Chemical Imbalance    Social History   Socioeconomic History  . Marital status: Divorced    Spouse name: Not on file  . Number of children: 3  . Years of education: Not  on file  . Highest education level: Not on file  Occupational History  . Not on file  Social Needs  . Financial resource strain: Not on file  . Food insecurity    Worry: Not on file    Inability: Not on file  . Transportation needs    Medical: Not on file    Non-medical: Not on file  Tobacco Use  . Smoking status: Current Every Day Smoker    Packs/day: 1.00    Years: 24.00    Pack years: 24.00    Types: Cigarettes  . Smokeless tobacco: Never Used  Substance and Sexual Activity  . Alcohol use: Yes    Alcohol/week: 0.0 standard drinks    Comment: occ  . Drug use: No  . Sexual activity: Yes  Lifestyle  . Physical activity    Days per week: Not on file    Minutes per session: Not on file  . Stress: Not on file  Relationships  . Social Herbalist on phone: Not on file    Gets together: Not on file    Attends religious service: Not on  file    Active member of club or organization: Not on file    Attends meetings of clubs or organizations: Not on file    Relationship status: Not on file  Other Topics Concern  . Not on file  Social History Narrative   ** Merged History Encounter **       Review of Systems - See HPI.  All other ROS are negative.  BP (!) 150/98   Pulse 88   Temp 98 F (36.7 C) (Temporal)   Resp 18   Wt 270 lb (122.5 kg)   SpO2 98%   BMI 35.62 kg/m   Physical Exam Vitals signs reviewed.  Constitutional:      Appearance: Normal appearance.  HENT:     Head: Normocephalic and atraumatic.  Eyes:     Conjunctiva/sclera: Conjunctivae normal.     Pupils: Pupils are equal, round, and reactive to light.  Neck:     Musculoskeletal: Neck supple.  Cardiovascular:     Rate and Rhythm: Normal rate and regular rhythm.     Pulses: Normal pulses.     Heart sounds: Normal heart sounds.  Pulmonary:     Effort: Pulmonary effort is normal.     Breath sounds: Normal breath sounds.  Chest:     Chest wall: Tenderness (chronic and unchanged) present.  Neurological:     General: No focal deficit present.     Mental Status: He is alert and oriented to person, place, and time.     Recent Results (from the past 2160 hour(s))  CBC with Differential/Platelet     Status: None   Collection Time: 06/10/19  3:17 AM  Result Value Ref Range   WBC 6.8 4.0 - 10.5 K/uL   RBC 4.81 4.22 - 5.81 MIL/uL   Hemoglobin 15.9 13.0 - 17.0 g/dL   HCT 48.1 39.0 - 52.0 %   MCV 100.0 80.0 - 100.0 fL   MCH 33.1 26.0 - 34.0 pg   MCHC 33.1 30.0 - 36.0 g/dL   RDW 13.2 11.5 - 15.5 %   Platelets 201 150 - 400 K/uL   Neutrophils Relative % 58 %   Neutro Abs 4.0 1.7 - 7.7 K/uL   Lymphocytes Relative 29 %   Lymphs Abs 2.0 0.7 - 4.0 K/uL   Monocytes Relative 9 %   Monocytes Absolute 0.6  0.1 - 1.0 K/uL   Eosinophils Relative 4 %   Eosinophils Absolute 0.2 0.0 - 0.5 K/uL   Basophils Relative 0 %   Basophils Absolute 0.0 0.0 - 0.1  K/uL    Comment: Performed at [facility] [address], [place], [place] [address]  Comprehensive metabolic panel     Status: None   Collection Time: 06/10/19  3:17 AM  Result Value Ref Range   Sodium 140 135 - 145 mmol/L   Potassium 4.2 3.5 - 5.1 mmol/L   Chloride 105 98 - 111 mmol/L   CO2 24 22 - 32 mmol/L   Glucose, Bld 93 70 - 99 mg/dL   BUN 17 6 - 20 mg/dL   Creatinine, Ser 0.96 0.61 - 1.24 mg/dL   Calcium 9.7 8.9 - 10.3 mg/dL   Total Protein 6.9 6.5 - 8.1 g/dL   Albumin 3.9 3.5 - 5.0 g/dL   AST 21 15 - 41 U/L   ALT 25 0 - 44 U/L   Alkaline Phosphatase 55 38 - 126 U/L   Total Bilirubin 0.7 0.3 - 1.2 mg/dL   GFR calc non Af Amer >60 >60 mL/min   GFR calc Af Amer >60 >60 mL/min   Anion gap 11 5 - 15    Comment: Performed at [facility], [place] [address], [place], [place] [address]  Lipase, blood     Status: None   Collection Time: 06/10/19  3:17 AM  Result Value Ref Range   Lipase 45 11 - 51 U/L    Comment: Performed at [facility] [address], [place], [place] [address]  Basic metabolic panel     Status: Abnormal   Collection Time: 06/27/19  3:44 PM  Result Value Ref Range   Sodium 140 135 - 145 mmol/L   Potassium 3.8 3.5 - 5.1 mmol/L   Chloride 108 98 - 111 mmol/L   CO2 23 22 - 32 mmol/L   Glucose, Bld 94 70 - 99 mg/dL   BUN 13 6 - 20 mg/dL   Creatinine, Ser 1.00 0.61 - 1.24 mg/dL   Calcium 8.7 (L) 8.9 - 10.3 mg/dL   GFR calc non Af Amer >60 >60 mL/min   GFR calc Af Amer >60 >60 mL/min   Anion gap 9 5 - 15    Comment: Performed at [facility], [place] [address], [place] [address]  CBC     Status: None   Collection Time: 06/27/19  3:44 PM  Result Value Ref Range   WBC 7.9 4.0 - 10.5 K/uL   RBC 4.54 4.22 - 5.81 MIL/uL   Hemoglobin 15.2 13.0 - 17.0 g/dL   HCT 44.3 39.0 - 52.0 %   MCV 97.6 80.0 - 100.0 fL   MCH 33.5 26.0 - 34.0 pg   MCHC 34.3 30.0 - 36.0 g/dL   RDW 12.6 11.5 - 15.5 %   Platelets 212 150 - 400 K/uL   nRBC  0.0 0.0 - 0.2 %    Comment: Performed at [facility], [place] [address], [place], [place] [address]  CBG monitoring, ED     Status: None   Collection Time: 06/27/19  4:37 PM  Result Value Ref Range   Glucose-Capillary 81 70 - 99 mg/dL  Urine cytology ancillary only([place])     Status: None   Collection Time: 07/20/19  9:36 AM  Result Value Ref Range   Neisseria Gonorrhea Negative  Chlamydia Negative    Trichomonas Negative    Molecular Disclaimer      APTIMA Combo 2 assay on the Panther  system is a target amplification   Molecular Disclaimer      nucleic acid probe test that utilizes target capture for in vitro   Molecular Disclaimer      qualitative detection and differentiation of ribosomal RNA from   Molecular Disclaimer      Chlamydia trachomatis (CT) and/or Neisseria gonorrhoeae (NG).  This test   Molecular Disclaimer      was validated and its appropriate performance characteristics determined   Civil Service fast streamer      by the reporting laboratory. Testing was performed at [facility]. This laboratory is certified under the HALP-37 as Proofreader      to perform high complexity clinical laboratory testing. Normal Reference   Molecular Disclaimer Range-Negative    Molecular Disclaimer      APTIMA Trichomonas Vaginalis assay on the Panther  system is a target   Molecular Disclaimer      amplification nucleic acid probe test that utilizes target capture for   Molecular Disclaimer      in vitro qualitative detection and differentiation of ribosomal RNA from   Molecular Disclaimer      Trichomonas Vaginalis.  This test was validated and its appropriate   Molecular Disclaimer      performance characteristics determined by the reporting laboratory.   Games developer was performed at [facility]. This laboratory is   Lexicographer under the [id number] as qualified  to perform high Technical brewer      clinical laboratory testing. Normal Reference Range-Negative  Acute Hep Panel & Hep B Surface Ab     Status: None   Collection Time: 07/20/19 10:06 AM  Result Value Ref Range   Hep A IgM NON-REACTIVE NON-REACTI   Hepatitis B Surface Ag NON-REACTIVE NON-REACTI   Hep B C IgM NON-REACTIVE NON-REACTI   HEPATITIS C ANTIBODY REFILL$(REFL) NON-REACTIVE NON-REACTI   SIGNAL TO CUT-OFF 0.01 <1.00    Comment: . HCV antibody was non-reactive. There is no laboratory  evidence of HCV infection. . In most cases, no further action is required. However, if recent HCV exposure is suspected, a test for HCV RNA (test code [phone]) is suggested. . For additional information please refer to http://education.[facility].com/faq/FAQ22v1 (This link is being provided for informational/ educational purposes only.) . Marland Kitchen For additional information, please refer to  http://education.[facility].com/faq/FAQ202  (This link is being provided for informational/ educational purposes only.) .   HIV antibody (with reflex)     Status: None   Collection Time: 07/20/19 10:06 AM  Result Value Ref Range   HIV 1&2 Ab, 4th Generation NON-REACTIVE NON-REACTI    Comment: HIV-1 antigen and HIV-1/HIV-2 antibodies were not detected. There is no laboratory evidence of HIV infection. Marland Kitchen PLEASE NOTE: This information has been disclosed to you from records whose confidentiality may be protected by state law.  If your state requires such protection, then the state law prohibits you from making any further disclosure of the information without the specific written consent of the person to whom it pertains, or as otherwise permitted by law. A general authorization for the release of medical or other information is NOT sufficient for this purpose. . For additional information please refer to http://education.[facility].com/faq/FAQ106 (This link is  being  provided for informational/ educational purposes only.) . Marland Kitchen The performance of this assay has not been clinically validated in patients less than 43 years old. .   HSV(herpes smplx)abs-1+2(IgG+IgM)-bld     Status: Abnormal   Collection Time: 07/20/19 10:06 AM  Result Value Ref Range   HSVI/II Comb IgM 1.06 (H) 0.00 - 0.90 Ratio    Comment:                                  Negative        <0.91                                  Equivocal 0.91 - 1.09                                  Positive        >1.09    HSV 1 Glycoprotein G Ab, IgG >62.20 (H) 0.00 - 0.90 index    Comment:                                  Negative        <0.91                                  Equivocal 0.91 - 1.09                                  Positive        >1.09  Note: Negative indicates no antibodies detected to  HSV-1. Equivocal may suggest early infection.  If  clinically appropriate, retest at later date. Positive  indicates antibodies detected to HSV-1.    HSV 2 IgG, Type Spec <0.91 0.00 - 0.90 index    Comment:                                  Negative        <0.91                                  Equivocal 0.91 - 1.09                                  Positive        >1.09  Note: Negative indicates no antibodies detected to  HSV-2. Equivocal may suggest early infection.  If  clinically appropriate, retest at later date. Positive  indicates antibodies detected to HSV-2.   RPR     Status: None   Collection Time: 07/20/19 10:06 AM  Result Value Ref Range   RPR Ser Ql NON-REACTIVE NON-REACTI  Pain Mgmt, Profile 8 w/Conf, U     Status: Abnormal   Collection Time: 07/20/19 10:06 AM  Result Value Ref Range   Creatinine 36.6 mg/dL   pH 7.0 4.5 - 9.0   Oxidant NEGATIVE mcg/mL   Amphetamines NEGATIVE  ng/mL   medMATCH Amphetamines CONSISTENT    Benzodiazepines POSITIVE ng/mL   Alphahydroxyalprazolam 206 ng/mL    Comment: See Note 1   medMATCH aOH alprazolam INCONSISTENT    Alphahydroxymidazolam  NEGATIVE ng/mL    Comment: See Note 1   medMATCH aOH midazolam CONSISTENT    Alphahydroxytriazolam NEGATIVE ng/mL    Comment: See Note 1   medMATCH aOH triazolam CONSISTENT    Aminoclonazepam NEGATIVE ng/mL    Comment: See Note 1   medMATCH Aminoclonazepam CONSISTENT    Hydroxyethylflurazepam NEGATIVE ng/mL    Comment: See Note 1   medMATCH OH,Et flurazepam CONSISTENT    Lorazepam NEGATIVE ng/mL    Comment: See Note 1   medMATCH Lorazepam CONSISTENT    Nordiazepam NEGATIVE ng/mL    Comment: See Note 1   medMATCH Nordiazepam CONSISTENT    Oxazepam NEGATIVE ng/mL    Comment: See Note 1   medMATCH Oxazepam CONSISTENT    Temazepam NEGATIVE ng/mL    Comment: See Note 1   medMATCH Temazepam CONSISTENT    Marijuana Metabolite NEGATIVE ng/mL   medMATCH Marijuana Metab CONSISTENT    Cocaine Metabolite NEGATIVE ng/mL   medMATCH Cocaine Metab CONSISTENT    Opiates POSITIVE ng/mL   Codeine NEGATIVE ng/mL    Comment: See Note 1   medMATCH Codeine CONSISTENT    Hydrocodone 274 ng/mL    Comment: See Note 1   medMATCH Hydrocodone INCONSISTENT    Hydromorphone 94 ng/mL    Comment: See Note 1   medMATCH Hydromorphone INCONSISTENT     Comment: See Note 2   Morphine NEGATIVE ng/mL    Comment: See Note 1   medMATCH Morphine CONSISTENT    Norhydrocodone 277 ng/mL    Comment: See Note 1   medMATCH Norhydrocodone INCONSISTENT     Comment: See Note 3   Oxycodone POSITIVE ng/mL   Noroxycodone 678 ng/mL    Comment: See Note 1   medMATCH Noroxycodone INCONSISTENT     Comment: See Note 4   Oxycodone 546 ng/mL    Comment: See Note 1   medMATCH Oxycodone INCONSISTENT    Oxymorphone 285 ng/mL    Comment: See Note 1   medMATCH Oxymorphone INCONSISTENT     Comment: See Note 5   Buprenorphine, Urine NEGATIVE ng/mL   medMATCH Buprenorphine CONSISTENT    MDMA NEGATIVE ng/mL   [facility] MDMA CONSISTENT    Alcohol Metabolites POSITIVE (A) <500 ng/mL   Ethyl Glucuronide (ETG) 2,968 ng/mL     Comment: See Note 1   medMATCH ETG INCONSISTENT    Ethyl Sulfate (ETS) 156 ng/mL    Comment: See Note 1   medMATCH ETS INCONSISTENT    6 Acetylmorphine NEGATIVE ng/mL   medMATCH 6 Acetylmorphine CONSISTENT     Comment: _0  . Note 1 . This test was developed and its analytical performance  characteristics have been determined by [employer]. It has not been cleared or approved by the FDA. This assay has been validated pursuant to the CLIA  regulations and is used for clinical purposes. . Note 2 Hydromorphone is a metabolite of hydrocodone as well  as a prescribed drug. Marland Kitchen Note 3 Norhydrocodone is a metabolite of Hydrocodone. . Note 4 Noroxycodone is a metabolite of Oxycodone. . Note 5 Oxymorphone is a metabolite of oxycodone as well as  a prescribed drug. Marland Kitchen Note 6 This drug testing is for medical treatment only.  Analysis was performed as non-forensic testing and  these results should be used only by healthcare  providers to render diagnosis or treatment, or to  monitor progress of medical conditions. Hazel Sams comments are:  - present when drug test results may be the result of     metabolism of one or mor e drugs or when results are     inconsistent with prescribed medication(s) listed.  - may be blank when drug results are consistent with     prescribed medication(s) listed. . For assistance with interpreting these drug results,  please contact a [employer] Toxicology  Specialist: [phone] [place] [phone]), M-F,  8am-6pm EST.   REFLEX TIQ     Status: None   Collection Time: 07/20/19 10:06 AM  Result Value Ref Range   REFLEX TIQ      Comment: OUR RECORDS INDICATE THAT YOU HAVE ORDERED  ORDER CODE .  THIS IS A REFLEX-SPECIFIC ORDER CODE. HOWEVER, ONLY THE INITIAL TEST WAS PERFORMED, BECAUSE WE DO NOT HAVE A REFLEX TESTING AUTHORIZATION FORM ON FILE FOR YOU.  TO  PERFORM A REFLEX  TEST WE NEED YOU TO SIGN AN AUTHORIZATION FORM  SPECIFYING (A) THE REFLEXIVE TEST AND (B) THE RESULTS THAT WILL  TRIGGER THE PERFORMANCE OF THE REFLEX TEST.  PLEASE CONTACT A  CLIENT SERVICE REPRESENTATIVE AT [facility] IF YOU WOULD  LIKE ADDITIONAL TESTING DONE OR CONTACT YOUR SALES REPRESENTATIVE  TO OBTAIN A COPY OF THE REFLEXIVE TESTING AUTHORIZATION FORM. . .    Assessment/Plan: 1. Anxiety and depression Continue current regimen for now. Will have him contact current Psychiatric practice to see about being place don cancellation list. Will look for other options for him.  2. Post-thoracotomy pain syndrome Will increase Xtampza to 36 mg twice daily. Continue Gabapentin and Cymbalta. Supportive measures reviewed. Pain management appt pending.  3. Essential hypertension Continue Toprol XL and DASH diet. Need to give medication time to get into system. He is to keep close eye on BP measurements so we can make further adjustments.  Close follow-up scheduled.      Leeanne Rio, PA-C

## 2019-07-22 NOTE — Patient Instructions (Signed)
Again all STI panels look good. Awaiting the last result regarding herpes testing but so far so good.  Continue the Toprol XL for blood pressure.  I have increase the Xtampza to 36 mg twice daily for the next 5 days. We need to give it a proper amount of time to see how you will do with this. We will adjust further after that time.   Please follow-up with all specialists as scheduled.  Make sure to schedule counseling appointments -- high recommend this at present.  Follow-up with me via video in 5 days (Monday or Tuesday)   DASH Eating Plan DASH stands for "Dietary Approaches to Stop Hypertension." The DASH eating plan is a healthy eating plan that has been shown to reduce high blood pressure (hypertension). It may also reduce your risk for type 2 diabetes, heart disease, and stroke. The DASH eating plan may also help with weight loss. What are tips for following this plan?  General guidelines  Avoid eating more than 2,300 mg (milligrams) of salt (sodium) a day. If you have hypertension, you may need to reduce your sodium intake to 1,500 mg a day.  Limit alcohol intake to no more than 1 drink a day for nonpregnant women and 2 drinks a day for men. One drink equals 12 oz of beer, 5 oz of wine, or 1 oz of hard liquor.  Work with your health care provider to maintain a healthy body weight or to lose weight. Ask what an ideal weight is for you.  Get at least 30 minutes of exercise that causes your heart to beat faster (aerobic exercise) most days of the week. Activities may include walking, swimming, or biking.  Work with your health care provider or diet and nutrition specialist (dietitian) to adjust your eating plan to your individual calorie needs. Reading food labels   Check food labels for the amount of sodium per serving. Choose foods with less than 5 percent of the Daily Value of sodium. Generally, foods with less than 300 mg of sodium per serving fit into this eating plan.  To  find whole grains, look for the word "whole" as the first word in the ingredient list. Shopping  Buy products labeled as "low-sodium" or "no salt added."  Buy fresh foods. Avoid canned foods and premade or frozen meals. Cooking  Avoid adding salt when cooking. Use salt-free seasonings or herbs instead of table salt or sea salt. Check with your health care provider or pharmacist before using salt substitutes.  Do not fry foods. Cook foods using healthy methods such as baking, boiling, grilling, and broiling instead.  Cook with heart-healthy oils, such as olive, canola, soybean, or sunflower oil. Meal planning  Eat a balanced diet that includes: ? 5 or more servings of fruits and vegetables each day. At each meal, try to fill half of your plate with fruits and vegetables. ? Up to 6-8 servings of whole grains each day. ? Less than 6 oz of lean meat, poultry, or fish each day. A 3-oz serving of meat is about the same size as a deck of cards. One egg equals 1 oz. ? 2 servings of low-fat dairy each day. ? A serving of nuts, seeds, or beans 5 times each week. ? Heart-healthy fats. Healthy fats called Omega-3 fatty acids are found in foods such as flaxseeds and coldwater fish, like sardines, salmon, and mackerel.  Limit how much you eat of the following: ? Canned or prepackaged foods. ? Food that is  high in trans fat, such as fried foods. ? Food that is high in saturated fat, such as fatty meat. ? Sweets, desserts, sugary drinks, and other foods with added sugar. ? Full-fat dairy products.  Do not salt foods before eating.  Try to eat at least 2 vegetarian meals each week.  Eat more home-cooked food and less restaurant, buffet, and fast food.  When eating at a restaurant, ask that your food be prepared with less salt or no salt, if possible. What foods are recommended? The items listed may not be a complete list. Talk with your dietitian about what dietary choices are best for you.  Grains Whole-grain or whole-wheat bread. Whole-grain or whole-wheat pasta. Brown rice. [place]. Bulgur. Whole-grain and low-sodium cereals. Pita bread. Low-fat, low-sodium crackers. Whole-wheat flour tortillas. Vegetables Fresh or frozen vegetables (raw, steamed, roasted, or grilled). Low-sodium or reduced-sodium tomato and vegetable juice. Low-sodium or reduced-sodium tomato sauce and tomato paste. Low-sodium or reduced-sodium canned vegetables. Fruits All fresh, dried, or frozen fruit. Canned fruit in natural juice (without added sugar). Meat and other protein foods Skinless chicken or [place]. Ground chicken or [place]. Pork with fat trimmed off. Fish and seafood. Egg whites. Dried beans, peas, or lentils. Unsalted nuts, nut butters, and seeds. Unsalted canned beans. Lean cuts of beef with fat trimmed off. Low-sodium, lean deli meat. Dairy Low-fat (1%) or fat-free (skim) milk. Fat-free, low-fat, or reduced-fat cheeses. Nonfat, low-sodium ricotta or cottage cheese. Low-fat or nonfat yogurt. Low-fat, low-sodium cheese. Fats and oils Soft margarine without trans fats. Vegetable oil. Low-fat, reduced-fat, or light mayonnaise and salad dressings (reduced-sodium). Canola, safflower, olive, soybean, and sunflower oils. Avocado. Seasoning and other foods Herbs. Spices. Seasoning mixes without salt. Unsalted popcorn and pretzels. Fat-free sweets. What foods are not recommended? The items listed may not be a complete list. Talk with your dietitian about what dietary choices are best for you. Grains Baked goods made with fat, such as croissants, muffins, or some breads. Dry pasta or rice meal packs. Vegetables Creamed or fried vegetables. Vegetables in a cheese sauce. Regular canned vegetables (not low-sodium or reduced-sodium). Regular canned tomato sauce and paste (not low-sodium or reduced-sodium). Regular tomato and vegetable juice (not low-sodium or reduced-sodium). Angie Fava. Olives. Fruits  Canned fruit in a light or heavy syrup. Fried fruit. Fruit in cream or butter sauce. Meat and other protein foods Fatty cuts of meat. Ribs. Fried meat. Berniece Salines. Sausage. Bologna and other processed lunch meats. Salami. Fatback. Hotdogs. Bratwurst. Salted nuts and seeds. Canned beans with added salt. Canned or smoked fish. Whole eggs or egg yolks. Chicken or [place] with skin. Dairy Whole or 2% milk, cream, and half-and-half. Whole or full-fat cream cheese. Whole-fat or sweetened yogurt. Full-fat cheese. Nondairy creamers. Whipped toppings. Processed cheese and cheese spreads. Fats and oils Butter. Stick margarine. Lard. Shortening. Ghee. Bacon fat. Tropical oils, such as coconut, palm kernel, or palm oil. Seasoning and other foods Salted popcorn and pretzels. Onion salt, garlic salt, seasoned salt, table salt, and sea salt. Worcestershire sauce. Tartar sauce. Barbecue sauce. Teriyaki sauce. Soy sauce, including reduced-sodium. Steak sauce. Canned and packaged gravies. Fish sauce. Oyster sauce. Cocktail sauce. Horseradish that you find on the shelf. Ketchup. Mustard. Meat flavorings and tenderizers. Bouillon cubes. Hot sauce and Tabasco sauce. Premade or packaged marinades. Premade or packaged taco seasonings. Relishes. Regular salad dressings. Where to find more information:  National Heart, Lung, and [place]: https://wilson-eaton.com/  American Heart Association: www.heart.org Summary  The DASH eating plan is a healthy eating  plan that has been shown to reduce high blood pressure (hypertension). It may also reduce your risk for type 2 diabetes, heart disease, and stroke.  With the DASH eating plan, you should limit salt (sodium) intake to 2,300 mg a day. If you have hypertension, you may need to reduce your sodium intake to 1,500 mg a day.  When on the DASH eating plan, aim to eat more fresh fruits and vegetables, whole grains, lean proteins, low-fat dairy, and heart-healthy fats.  Work with  your health care provider or diet and nutrition specialist (dietitian) to adjust your eating plan to your individual calorie needs. This information is not intended to replace advice given to you by your health care provider. Make sure you discuss any questions you have with your health care provider. Document Released: 09/26/2011 Document Revised: 09/19/2017 Document Reviewed: 09/30/2016 Elsevier Patient Education  2020 [employer].

## 2019-07-23 ENCOUNTER — Other Ambulatory Visit: Payer: Self-pay | Admitting: Emergency Medicine

## 2019-07-23 DIAGNOSIS — Z202 Contact with and (suspected) exposure to infections with a predominantly sexual mode of transmission: Secondary | ICD-10-CM

## 2019-07-23 LAB — ACUTE HEP PANEL AND HEP B SURFACE AB
HEPATITIS C ANTIBODY REFILL$(REFL): NONREACTIVE
Hep A IgM: NONREACTIVE
Hep B C IgM: NONREACTIVE
Hepatitis B Surface Ag: NONREACTIVE
SIGNAL TO CUT-OFF: 0.01 (ref <1.00)

## 2019-07-23 LAB — PAIN MGMT, PROFILE 8 W/CONF, U
6 Acetylmorphine: NEGATIVE ng/mL
Alcohol Metabolites: POSITIVE ng/mL — AB (ref <500)
Alphahydroxyalprazolam: 206 ng/mL
Alphahydroxymidazolam: NEGATIVE ng/mL
Alphahydroxytriazolam: NEGATIVE ng/mL
Aminoclonazepam: NEGATIVE ng/mL
Amphetamines: NEGATIVE ng/mL
Benzodiazepines: POSITIVE ng/mL
Buprenorphine, Urine: NEGATIVE ng/mL
Cocaine Metabolite: NEGATIVE ng/mL
Codeine: NEGATIVE ng/mL
Creatinine: 36.6 mg/dL
Ethyl Glucuronide (ETG): 2968 ng/mL
Ethyl Sulfate (ETS): 156 ng/mL
Hydrocodone: 274 ng/mL
Hydromorphone: 94 ng/mL
Hydroxyethylflurazepam: NEGATIVE ng/mL
Lorazepam: NEGATIVE ng/mL
MDMA: NEGATIVE ng/mL
Marijuana Metabolite: NEGATIVE ng/mL
Morphine: NEGATIVE ng/mL
Nordiazepam: NEGATIVE ng/mL
Norhydrocodone: 277 ng/mL
Noroxycodone: 678 ng/mL
Opiates: POSITIVE ng/mL
Oxazepam: NEGATIVE ng/mL
Oxidant: NEGATIVE ug/mL
Oxycodone: 546 ng/mL
Oxycodone: POSITIVE ng/mL
Oxymorphone: 285 ng/mL
Temazepam: NEGATIVE ng/mL
pH: 7 (ref 4.5–9.0)

## 2019-07-23 LAB — REFLEX TIQ

## 2019-07-23 LAB — HSV(HERPES SMPLX)ABS-I+II(IGG+IGM)-BLD
HSV 1 Glycoprotein G Ab, IgG: >62.2 index — ABNORMAL HIGH (ref 0.00–0.90)
HSV 2 IgG, Type Spec: <0.91 index (ref 0.00–0.90)
HSVI/II Comb IgM: 1.06 Ratio — ABNORMAL HIGH (ref 0.00–0.90)

## 2019-07-23 LAB — HIV ANTIBODY (ROUTINE TESTING W REFLEX): HIV 1&2 Ab, 4th Generation: NONREACTIVE

## 2019-07-23 LAB — RPR: RPR Ser Ql: NONREACTIVE

## 2019-07-26 ENCOUNTER — Ambulatory Visit (INDEPENDENT_AMBULATORY_CARE_PROVIDER_SITE_OTHER): Payer: Medicaid Other | Admitting: Physician Assistant

## 2019-07-26 ENCOUNTER — Other Ambulatory Visit: Payer: Self-pay

## 2019-07-26 ENCOUNTER — Encounter: Payer: Self-pay | Admitting: Physician Assistant

## 2019-07-26 ENCOUNTER — Telehealth: Payer: Self-pay

## 2019-07-26 MED ORDER — XTAMPZA ER 27 MG PO C12A: 1.0000 | EXTENDED_RELEASE_CAPSULE | Freq: Two times a day (BID) | ORAL | 0 refills | Status: DC

## 2019-07-26 NOTE — Progress Notes (Signed)
Cancelled appointment due to pharmacy not filling pain medication due to [facility] insurance. Appointment rescheduled for 1 week.

## 2019-07-26 NOTE — Telephone Encounter (Signed)
Patient has appt this morning. Will discuss at that time. No paperwork received from insurance or pharmacy so not sure what he is referring to.
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
## 2019-07-27 ENCOUNTER — Ambulatory Visit: Payer: Medicaid Other | Admitting: Physician Assistant

## 2019-07-27 NOTE — Progress Notes (Signed)
Erroneous encounter

## 2019-07-28 ENCOUNTER — Other Ambulatory Visit: Payer: Self-pay

## 2019-07-28 ENCOUNTER — Other Ambulatory Visit: Payer: Self-pay | Admitting: Physician Assistant

## 2019-07-28 ENCOUNTER — Telehealth: Payer: Self-pay

## 2019-07-28 DIAGNOSIS — F419 Anxiety disorder, unspecified: Secondary | ICD-10-CM

## 2019-07-28 DIAGNOSIS — F329 Major depressive disorder, single episode, unspecified: Secondary | ICD-10-CM

## 2019-07-28 DIAGNOSIS — F32A Depression, unspecified: Secondary | ICD-10-CM

## 2019-07-28 DIAGNOSIS — G8912 Acute post-thoracotomy pain: Secondary | ICD-10-CM

## 2019-07-28 DIAGNOSIS — T84039A Mechanical loosening of unspecified internal prosthetic joint, initial encounter: Secondary | ICD-10-CM

## 2019-07-28 DIAGNOSIS — G894 Chronic pain syndrome: Secondary | ICD-10-CM

## 2019-07-28 DIAGNOSIS — F4312 Post-traumatic stress disorder, chronic: Secondary | ICD-10-CM

## 2019-07-28 DIAGNOSIS — F4310 Post-traumatic stress disorder, unspecified: Secondary | ICD-10-CM

## 2019-07-28 DIAGNOSIS — F515 Nightmare disorder: Secondary | ICD-10-CM

## 2019-07-28 MED ORDER — LOSARTAN POTASSIUM 50 MG PO TABS: 50.0000 mg | ORAL_TABLET | Freq: Every day | ORAL | 0 refills | Status: DC

## 2019-07-28 NOTE — Telephone Encounter (Signed)
Ok to add on losartan 50 mg to his Metoprolol. Needs in-office follow-up on Friday. If BP not improving or increasing at all with current symptoms -- needs ER assessment. He has had previous comprehensive workup in ER negative for ACS or acute neurological issue. For his Alprazolam he is clearly taking more than as directed so unfortunately that   Please make sure new referral to pain management is placed. Let us also call his Psychiatrist office to expedite appointment for him as anxiety is deteriorating and he continued to take medications more than as directed based on when it was filled versus saying his is out. They will be taking this over once they see him.

## 2019-07-28 NOTE — Telephone Encounter (Signed)
Ok to try a different psychiatric practice. Want him seen ASAP

## 2019-07-28 NOTE — Telephone Encounter (Signed)
BP after meds this morning: 174/106, BP yesterday 150/100's, stated that it "doesn't matter what he is doing, either relaxing or working, BP is still elevated." Patient states that he is experiencing a headache, dizziness, and pressure in his head. Claims to have been taking BP medicine as prescribed.  Has taken 1.5 Xanax so far today, down to 4 pills, pharmacy has refused further refills until Sunday. Stated that his chest pain is at an 8 so far today. Breathing normal and able to complete sentences/thoughts while speaking to me. Much improved from previous phone calls with patient.  I informed patient that I would route message to PCP.

## 2019-07-28 NOTE — Telephone Encounter (Signed)
Spoke with patient, aware of script sent to pharmacy. Gave verbal understanding of PCP recommendations. Spoke with Dr. Sima Matas office, patient is on a wait list for any cancelled NP appt. Referral sent in for pain management. OK to try and get patient NP appt with another psychiatry office, stated he did not care who he saw. Please advise

## 2019-07-28 NOTE — Telephone Encounter (Signed)
I reached out to [facility] to see if they had any sooner appts, they are also scheduling out 10-12 weeks for psychiatrist and do not have any therapist accepting new patients. The referral coordinator gave me several places to call in the area that accept Medicaid.   [facility] ([facility] [address] Lorina Rabon, [place] They offer walk-in assessments Mon, Wed, and Fri 8AM-2PM, after assessment, they usually have patients scheduled within 30 days with psychiatrist.   [facility] [phone]  [place], [place] Walk in assessments daily 9AM-4PM, bring ID, insurance care, and all meds that patient is taking.   I have placed new referral for psychiatry also with location as "external"

## 2019-07-29 ENCOUNTER — Telehealth: Payer: Self-pay | Admitting: Physician Assistant

## 2019-07-29 NOTE — Telephone Encounter (Signed)
Patient's bp this morning is 164/106. He is still in a lot of pain. Please call patient at [phone]. Thank you.

## 2019-07-29 NOTE — Telephone Encounter (Signed)
Please let patient know about these walk-in appointments. Thank you.

## 2019-07-29 NOTE — Telephone Encounter (Signed)
Patient's bp has gone up to 172/110. Advised patient if his bp continues to go up he will need to go to the emergency room. Patient is still requesting a call back about the pain.

## 2019-07-29 NOTE — Telephone Encounter (Signed)
Patient called again, pulse is up to 100 bp is 182/108.

## 2019-07-29 NOTE — Telephone Encounter (Signed)
Advised patient to keep tomorrow's appt or go to ED.

## 2019-07-29 NOTE — Telephone Encounter (Signed)
Called patient, lmovm. Patient has appointment tomorrow at 9 am  FYI

## 2019-07-30 ENCOUNTER — Other Ambulatory Visit: Payer: Self-pay

## 2019-07-30 ENCOUNTER — Ambulatory Visit (INDEPENDENT_AMBULATORY_CARE_PROVIDER_SITE_OTHER): Payer: Medicaid Other | Admitting: Physician Assistant

## 2019-07-30 ENCOUNTER — Encounter: Payer: Self-pay | Admitting: Physician Assistant

## 2019-07-30 VITALS — BP 158/98 | HR 100 | Temp 99.8°F | Resp 18 | Ht 73.0 in | Wt 295.0 lb

## 2019-07-30 DIAGNOSIS — F4310 Post-traumatic stress disorder, unspecified: Secondary | ICD-10-CM | POA: Diagnosis not present

## 2019-07-30 DIAGNOSIS — G8912 Acute post-thoracotomy pain: Secondary | ICD-10-CM | POA: Diagnosis not present

## 2019-07-30 DIAGNOSIS — F329 Major depressive disorder, single episode, unspecified: Secondary | ICD-10-CM

## 2019-07-30 DIAGNOSIS — F419 Anxiety disorder, unspecified: Secondary | ICD-10-CM | POA: Diagnosis not present

## 2019-07-30 DIAGNOSIS — I10 Essential (primary) hypertension: Secondary | ICD-10-CM

## 2019-07-30 MED ORDER — BACLOFEN 10 MG PO TABS: 10.0000 mg | ORAL_TABLET | Freq: Three times a day (TID) | ORAL | 0 refills | Status: DC

## 2019-07-30 MED ORDER — XTAMPZA ER 36 MG PO C12A: 1.0000 | EXTENDED_RELEASE_CAPSULE | Freq: Two times a day (BID) | ORAL | 0 refills | Status: AC

## 2019-07-30 MED ORDER — METOPROLOL SUCCINATE ER 50 MG PO TB24: 50.0000 mg | ORAL_TABLET | Freq: Every day | ORAL | 3 refills | Status: DC

## 2019-07-30 NOTE — Patient Instructions (Addendum)
-  Please start the new dose of Metoprolol (Toprol XL) -- 50 mg daily increase of old dose (25 mg).  -Continue the Losartan once daily as directed.  -No caffeine or alcohol -Keep hydrated and rest. -Only check BP after resting for 10 minutes. -Avoid strenuous activities until we get things under better control.  -Stop the Prazosin. -Continue the Cymbalta and Buspar. The added dose of Metoprolol should also help with anxiety. -Continue Xanax as directed. Do not take more than as directed.  - Use all of the multiple resources given to schedule appointment with counseling  - As discussed, at this point I recommend you contact the [facility] for a more acute assessment/management of your significant level or panic and worsening PTSD. Again the number is [phone]. The address is [address] [place], [place] [address].  - We are trying to get you in with pain management ASAP.  If there are any further violations in your CS agreement, demanding behavior to staff, etc I will have no choice but to dismiss you from our practice.

## 2019-07-30 NOTE — Progress Notes (Signed)
Patient presents to clinic today for follow-up of Hypertension, PTSD/GAD and Post-thoracotomy pain syndrome.   In regards to Hypertension, patient is taking his Metoprolol XL and losartan daily as directed. Notes tolerating well but still having elevated BP measurements. Notes that pain seems to have a big part in BP levels. Notes yesterday levels being 164/106, 172/110 and then 182/110. These were all checked within 30 minutes of each other. Also notes he was in a lot of pain as he had been up on a ladder trying to clean out gutters, something he knows he should not be doing. Notes BP improved after taking a nap. Denied new onset chest pain or any lightheadedness or dizziness. Is still taking his Cymbalta, Xanax and Prazosin for PTSD and anxiety. Is not noting any improvement in nighttime symptoms and night terrors with the Prazosin. For pain, is currently on a regimen of Xtampza  27 mg daily. Is taking as directed and not noting relief with medication. Pain management assessment is still pending.   Past Medical History:  Diagnosis Date  . Anxiety and depression 08/01/2013  . Cancer ([place])   . Cellulitis    left leg and stomach  . Chicken pox   . Chronic pain   . Diarrhea 08/01/2013  . History of kidney cancer   . Hx of vasectomy   . Hypertension   . Renal cell carcinoma ([place]) 06/01/2013    Current Outpatient Medications on File Prior to Visit  Medication Sig Dispense Refill  . acetaminophen (TYLENOL) 500 MG tablet Take 1,000 mg by mouth every 6 (six) hours as needed for moderate pain.    Marland Kitchen albuterol (VENTOLIN HFA) 108 (90 Base) MCG/ACT inhaler Inhale 1-2 puffs into the lungs every 6 (six) hours as needed for wheezing or shortness of breath. 1 Inhaler 0  . ALPRAZolam (XANAX) 1 MG tablet Take 0.5-1 tablets (0.5-1 mg total) by mouth 3 (three) times daily as needed for anxiety. 60 tablet 0  . Blood Pressure Monitoring (BLOOD PRESSURE MONITOR AUTOMAT) DEVI Check blood pressure during pain  level. 1 kit 0  . busPIRone (BUSPAR) 7.5 MG tablet TAKE 1 TABLET BY MOUTH 2 TIMES DAILY. 180 tablet 0  . DULoxetine (CYMBALTA) 30 MG capsule TAKE 1 CAPSULE BY MOUTH 2 TIMES DAILY. 180 capsule 0  . famotidine (PEPCID) 20 MG tablet Take 1 tablet (20 mg total) by mouth 2 (two) times daily. 30 tablet 0  . gabapentin (NEURONTIN) 300 MG capsule Take 1 capsule (300 mg total) by mouth 3 (three) times daily. Take 3 capsules in the morning, 3 capsules at lunch, and 3 capsules are bedtime 270 capsule 1  . losartan (COZAAR) 50 MG tablet Take 1 tablet (50 mg total) by mouth daily. 30 tablet 0  . metoprolol succinate (TOPROL-XL) 25 MG 24 hr tablet Take 1 tablet (25 mg total) by mouth daily. 30 tablet 1  . mometasone (ASMANEX, 60 METERED DOSES,) 220 MCG/INH inhaler Inhale 1 puff into the lungs 2 (two) times daily. 1 Inhaler 12  . oxyCODONE ER (XTAMPZA ER) 27 MG C12A Take 1 capsule by mouth 2 (two) times daily. 14 capsule 0  . pantoprazole (PROTONIX) 40 MG tablet Take 1 tablet (40 mg total) by mouth daily. 30 tablet 3  . prazosin (MINIPRESS) 1 MG capsule Take 2 capsules (2 mg total) by mouth at bedtime. 60 capsule 1   No current facility-administered medications on file prior to visit.     Allergies  Allergen Reactions  . Bee Venom Shortness  Of Breath and Swelling    Arm swells  . Hornet Venom Shortness Of Breath and Swelling    Arm swells    Family History  Problem Relation Age of Onset  . Cirrhosis Father   . Colitis Father   . Heart disease Father   . Asthma Father   . Other Father        Chemical Imbalance  . Heart attack Other        Paternal Grandparents  . Stroke Other        Paternal Grandparents  . Prostate cancer Paternal Grandfather   . Diabetes Maternal Grandfather   . Alcohol abuse Mother   . Other Brother        Intestinal Fissure  . Colon polyps Sister        intestinal problems  . Asthma Son   . Other Brother        Chemical Imbalance    Social History   Socioeconomic  History  . Marital status: Divorced    Spouse name: Not on file  . Number of children: 3  . Years of education: Not on file  . Highest education level: Not on file  Occupational History  . Not on file  Social Needs  . Financial resource strain: Not on file  . Food insecurity    Worry: Not on file    Inability: Not on file  . Transportation needs    Medical: Not on file    Non-medical: Not on file  Tobacco Use  . Smoking status: Current Every Day Smoker    Packs/day: 1.00    Years: 24.00    Pack years: 24.00    Types: Cigarettes  . Smokeless tobacco: Never Used  Substance and Sexual Activity  . Alcohol use: Yes    Alcohol/week: 0.0 standard drinks    Comment: occ  . Drug use: No  . Sexual activity: Yes  Lifestyle  . Physical activity    Days per week: Not on file    Minutes per session: Not on file  . Stress: Not on file  Relationships  . Social Herbalist on phone: Not on file    Gets together: Not on file    Attends religious service: Not on file    Active member of club or organization: Not on file    Attends meetings of clubs or organizations: Not on file    Relationship status: Not on file  Other Topics Concern  . Not on file  Social History Narrative   ** Merged History Encounter **       Review of Systems - See HPI.  All other ROS are negative.  BP (!) 158/98   Pulse 100   Temp 99.8 F (37.7 C) (Temporal)   Resp 18   Ht 6' 1" (1.854 m)   Wt 295 lb (133.8 kg)   SpO2 98%   BMI 38.92 kg/m   Physical Exam Constitutional:      Appearance: Normal appearance.  HENT:     Head: Normocephalic and atraumatic.     Right Ear: Tympanic membrane normal.     Left Ear: Tympanic membrane normal.     Mouth/Throat:     Mouth: Mucous membranes are moist.  Eyes:     Conjunctiva/sclera: Conjunctivae normal.     Pupils: Pupils are equal, round, and reactive to light.  Neck:     Musculoskeletal: Neck supple.  Cardiovascular:     Rate and Rhythm:  Normal rate and regular rhythm.     Pulses: Normal pulses.  Pulmonary:     Effort: Pulmonary effort is normal.     Breath sounds: Normal breath sounds.  Neurological:     Mental Status: He is alert.  Psychiatric:        Attention and Perception: He is inattentive.        Mood and Affect: Mood is anxious and depressed. Affect is labile.        Speech: He is communicative. Speech is tangential. Speech is not delayed.        Behavior: Behavior is not agitated or aggressive.        Judgment: Judgment is impulsive.     Recent Results (from the past 2160 hour(s))  CBC with Differential/Platelet     Status: None   Collection Time: 06/10/19  3:17 AM  Result Value Ref Range   WBC 6.8 4.0 - 10.5 K/uL   RBC 4.81 4.22 - 5.81 MIL/uL   Hemoglobin 15.9 13.0 - 17.0 g/dL   HCT 48.1 39.0 - 52.0 %   MCV 100.0 80.0 - 100.0 fL   MCH 33.1 26.0 - 34.0 pg   MCHC 33.1 30.0 - 36.0 g/dL   RDW 13.2 11.5 - 15.5 %   Platelets 201 150 - 400 K/uL   Neutrophils Relative % 58 %   Neutro Abs 4.0 1.7 - 7.7 K/uL   Lymphocytes Relative 29 %   Lymphs Abs 2.0 0.7 - 4.0 K/uL   Monocytes Relative 9 %   Monocytes Absolute 0.6 0.1 - 1.0 K/uL   Eosinophils Relative 4 %   Eosinophils Absolute 0.2 0.0 - 0.5 K/uL   Basophils Relative 0 %   Basophils Absolute 0.0 0.0 - 0.1 K/uL    Comment: Performed at [facility], [address], [place], [place] [address]  Comprehensive metabolic panel     Status: None   Collection Time: 06/10/19  3:17 AM  Result Value Ref Range   Sodium 140 135 - 145 mmol/L   Potassium 4.2 3.5 - 5.1 mmol/L   Chloride 105 98 - 111 mmol/L   CO2 24 22 - 32 mmol/L   Glucose, Bld 93 70 - 99 mg/dL   BUN 17 6 - 20 mg/dL   Creatinine, Ser 0.96 0.61 - 1.24 mg/dL   Calcium 9.7 8.9 - 10.3 mg/dL   Total Protein 6.9 6.5 - 8.1 g/dL   Albumin 3.9 3.5 - 5.0 g/dL   AST 21 15 - 41 U/L   ALT 25 0 - 44 U/L   Alkaline Phosphatase 55 38 - 126 U/L   Total Bilirubin 0.7 0.3 - 1.2 mg/dL   GFR calc non Af  Amer >60 >60 mL/min   GFR calc Af Amer >60 >60 mL/min   Anion gap 11 5 - 15    Comment: Performed at [facility], [place] [address] [place], [place] [address]  Lipase, blood     Status: None   Collection Time: 06/10/19  3:17 AM  Result Value Ref Range   Lipase 45 11 - 51 U/L    Comment: Performed at [facility] [address] [place], [place] [address]  Basic metabolic panel     Status: Abnormal   Collection Time: 06/27/19  3:44 PM  Result Value Ref Range   Sodium 140 135 - 145 mmol/L   Potassium 3.8 3.5 - 5.1 mmol/L   Chloride 108 98 - 111 mmol/L   CO2  23 22 - 32 mmol/L   Glucose, Bld 94 70 - 99 mg/dL   BUN 13 6 - 20 mg/dL   Creatinine, Ser 1.00 0.61 - 1.24 mg/dL   Calcium 8.7 (L) 8.9 - 10.3 mg/dL   GFR calc non Af Amer >60 >60 mL/min   GFR calc Af Amer >60 >60 mL/min   Anion gap 9 5 - 15    Comment: Performed at [facility] [address] [place] [address]  CBC     Status: None   Collection Time: 06/27/19  3:44 PM  Result Value Ref Range   WBC 7.9 4.0 - 10.5 K/uL   RBC 4.54 4.22 - 5.81 MIL/uL   Hemoglobin 15.2 13.0 - 17.0 g/dL   HCT 44.3 39.0 - 52.0 %   MCV 97.6 80.0 - 100.0 fL   MCH 33.5 26.0 - 34.0 pg   MCHC 34.3 30.0 - 36.0 g/dL   RDW 12.6 11.5 - 15.5 %   Platelets 212 150 - 400 K/uL   nRBC 0.0 0.0 - 0.2 %    Comment: Performed at [facility], [place] [address] [place], [place] [address]  CBG monitoring, ED     Status: None   Collection Time: 06/27/19  4:37 PM  Result Value Ref Range   Glucose-Capillary 81 70 - 99 mg/dL  Urine cytology ancillary only([place])     Status: None   Collection Time: 07/20/19  9:36 AM  Result Value Ref Range   Neisseria Gonorrhea Negative    Chlamydia Negative    Trichomonas Negative    Molecular Disclaimer      APTIMA Combo 2 assay on the Panther  system is a target amplification   Molecular Disclaimer      nucleic acid probe test that utilizes target capture for in Sport and exercise psychologist      qualitative detection and differentiation of ribosomal RNA from   Molecular Disclaimer      Chlamydia trachomatis (CT) and/or Neisseria gonorrhoeae (NG).  This test   Molecular Disclaimer      was validated and its appropriate performance characteristics determined   Civil Service fast streamer      by the reporting laboratory. Testing was performed at [facility]. This laboratory is certified under the TDDU-20 as Proofreader      to perform high complexity clinical laboratory testing. Normal Reference   Molecular Disclaimer Range-Negative    Molecular Disclaimer      APTIMA Trichomonas Vaginalis assay on the Panther  system is a target   Molecular Disclaimer      amplification nucleic acid probe test that utilizes target capture for   Molecular Disclaimer      in vitro qualitative detection and differentiation of ribosomal RNA from   Molecular Disclaimer      Trichomonas Vaginalis.  This test was validated and its appropriate   Molecular Disclaimer      performance characteristics determined by the reporting laboratory.   Games developer was performed at [facility]. This laboratory is   Lexicographer under the URKY-70 as qualified to perform high Technical brewer      clinical laboratory testing. Normal Reference Range-Negative  Acute Hep Panel & Hep B Surface Ab     Status: None   Collection Time: 07/20/19 10:06 AM  Result Value Ref Range   Hep A IgM NON-REACTIVE NON-REACTI   Hepatitis B Surface Ag NON-REACTIVE NON-REACTI   Hep B C IgM NON-REACTIVE NON-REACTI   HEPATITIS C ANTIBODY REFILL$(REFL) NON-REACTIVE NON-REACTI   SIGNAL TO CUT-OFF 0.01 <1.00    Comment: . HCV antibody was non-reactive. There is no laboratory  evidence of HCV infection. . In most cases, no further action is required. However, if recent HCV exposure is suspected, a test  for HCV RNA (test code [phone]) is suggested. . For additional information please refer to http://education.quest[facility].com/faq/FAQ22v1 (This link is being provided for informational/ educational purposes only.) . Marland Kitchen For additional information, please refer to  http://education.[facility].com/faq/FAQ202  (This link is being provided for informational/ educational purposes only.) .   HIV antibody (with reflex)     Status: None   Collection Time: 07/20/19 10:06 AM  Result Value Ref Range   HIV 1&2 Ab, 4th Generation NON-REACTIVE NON-REACTI    Comment: HIV-1 antigen and HIV-1/HIV-2 antibodies were not detected. There is no laboratory evidence of HIV infection. Marland Kitchen PLEASE NOTE: This information has been disclosed to you from records whose confidentiality may be protected by state law.  If your state requires such protection, then the state law prohibits you from making any further disclosure of the information without the specific written consent of the person to whom it pertains, or as otherwise permitted by law. A general authorization for the release of medical or other information is NOT sufficient for this purpose. . For additional information please refer to http://education.quest[facility].com/faq/FAQ106 (This link is being provided for informational/ educational purposes only.) . Marland Kitchen The performance of this assay has not been clinically validated in patients less than 17 years old. .   HSV(herpes smplx)abs-1+2(IgG+IgM)-bld     Status: Abnormal   Collection Time: 07/20/19 10:06 AM  Result Value Ref Range   HSVI/II Comb IgM 1.06 (H) 0.00 - 0.90 Ratio    Comment:                                  Negative        <0.91                                  Equivocal 0.91 - 1.09                                  Positive        >1.09    HSV 1 Glycoprotein G Ab, IgG >62.20 (H) 0.00 - 0.90 index    Comment:                                  Negative        <0.91                                   Equivocal 0.91 - 1.09                                  Positive        >1.09  Note: Negative indicates no antibodies detected  to  HSV-1. Equivocal may suggest early infection.  If  clinically appropriate, retest at later date. Positive  indicates antibodies detected to HSV-1.    HSV 2 IgG, Type Spec <0.91 0.00 - 0.90 index    Comment:                                  Negative        <0.91                                  Equivocal 0.91 - 1.09                                  Positive        >1.09  Note: Negative indicates no antibodies detected to  HSV-2. Equivocal may suggest early infection.  If  clinically appropriate, retest at later date. Positive  indicates antibodies detected to HSV-2.   RPR     Status: None   Collection Time: 07/20/19 10:06 AM  Result Value Ref Range   RPR Ser Ql NON-REACTIVE NON-REACTI  Pain Mgmt, Profile 8 w/Conf, U     Status: Abnormal   Collection Time: 07/20/19 10:06 AM  Result Value Ref Range   Creatinine 36.6 mg/dL   pH 7.0 4.5 - 9.0   Oxidant NEGATIVE mcg/mL   Amphetamines NEGATIVE ng/mL   medMATCH Amphetamines CONSISTENT    Benzodiazepines POSITIVE ng/mL   Alphahydroxyalprazolam 206 ng/mL    Comment: See Note 1   medMATCH aOH alprazolam INCONSISTENT    Alphahydroxymidazolam NEGATIVE ng/mL    Comment: See Note 1   medMATCH aOH midazolam CONSISTENT    Alphahydroxytriazolam NEGATIVE ng/mL    Comment: See Note 1   medMATCH aOH triazolam CONSISTENT    Aminoclonazepam NEGATIVE ng/mL    Comment: See Note 1   medMATCH Aminoclonazepam CONSISTENT    Hydroxyethylflurazepam NEGATIVE ng/mL    Comment: See Note 1   medMATCH OH,Et flurazepam CONSISTENT    Lorazepam NEGATIVE ng/mL    Comment: See Note 1   medMATCH Lorazepam CONSISTENT    Nordiazepam NEGATIVE ng/mL    Comment: See Note 1   medMATCH Nordiazepam CONSISTENT    Oxazepam NEGATIVE ng/mL    Comment: See Note 1   medMATCH Oxazepam CONSISTENT    Temazepam NEGATIVE  ng/mL    Comment: See Note 1   medMATCH Temazepam CONSISTENT    Marijuana Metabolite NEGATIVE ng/mL   medMATCH Marijuana Metab CONSISTENT    Cocaine Metabolite NEGATIVE ng/mL   medMATCH Cocaine Metab CONSISTENT    Opiates POSITIVE ng/mL   Codeine NEGATIVE ng/mL    Comment: See Note 1   medMATCH Codeine CONSISTENT    Hydrocodone 274 ng/mL    Comment: See Note 1   medMATCH Hydrocodone INCONSISTENT    Hydromorphone 94 ng/mL    Comment: See Note 1   medMATCH Hydromorphone INCONSISTENT     Comment: See Note 2   Morphine NEGATIVE ng/mL    Comment: See Note 1   medMATCH Morphine CONSISTENT    Norhydrocodone 277 ng/mL    Comment: See Note 1   medMATCH Norhydrocodone INCONSISTENT     Comment: See Note 3   Oxycodone POSITIVE ng/mL   Noroxycodone 678 ng/mL    Comment: See Note 1  medMATCH Noroxycodone INCONSISTENT     Comment: See Note 4   Oxycodone 546 ng/mL    Comment: See Note 1   medMATCH Oxycodone INCONSISTENT    Oxymorphone 285 ng/mL    Comment: See Note 1   medMATCH Oxymorphone INCONSISTENT     Comment: See Note 5   Buprenorphine, Urine NEGATIVE ng/mL   medMATCH Buprenorphine CONSISTENT    MDMA NEGATIVE ng/mL   [facility] MDMA CONSISTENT    Alcohol Metabolites POSITIVE (A) <500 ng/mL   Ethyl Glucuronide (ETG) 2,968 ng/mL    Comment: See Note 1   medMATCH ETG INCONSISTENT    Ethyl Sulfate (ETS) 156 ng/mL    Comment: See Note 1   medMATCH ETS INCONSISTENT    6 Acetylmorphine NEGATIVE ng/mL   medMATCH 6 Acetylmorphine CONSISTENT     Comment: _0  . Note 1 . This test was developed and its analytical performance  characteristics have been determined by [employer]. It has not been cleared or approved by the FDA. This assay has been validated pursuant to the CLIA  regulations and is used for clinical purposes. . Note 2 Hydromorphone is a metabolite of hydrocodone as well  as a prescribed drug. Marland Kitchen Note 3  Norhydrocodone is a metabolite of Hydrocodone. . Note 4 Noroxycodone is a metabolite of Oxycodone. . Note 5 Oxymorphone is a metabolite of oxycodone as well as  a prescribed drug. Marland Kitchen Note 6 This drug testing is for medical treatment only.   Analysis was performed as non-forensic testing and  these results should be used only by healthcare  providers to render diagnosis or treatment, or to  monitor progress of medical conditions. Hazel Sams comments are:  - present when drug test results may be the result of     metabolism of one or mor e drugs or when results are     inconsistent with prescribed medication(s) listed.  - may be blank when drug results are consistent with     prescribed medication(s) listed. . For assistance with interpreting these drug results,  please contact a [employer] Toxicology  Specialist: [phone] [place] [phone]), M-F,  8am-6pm EST.   REFLEX TIQ     Status: None   Collection Time: 07/20/19 10:06 AM  Result Value Ref Range   REFLEX TIQ      Comment: OUR RECORDS INDICATE THAT YOU HAVE ORDERED  ORDER CODE .  THIS IS A REFLEX-SPECIFIC ORDER CODE. HOWEVER, ONLY THE INITIAL TEST WAS PERFORMED, BECAUSE WE DO NOT HAVE A REFLEX TESTING AUTHORIZATION FORM ON FILE FOR YOU.  TO  PERFORM A REFLEX TEST WE NEED YOU TO SIGN AN AUTHORIZATION FORM  SPECIFYING (A) THE REFLEXIVE TEST AND (B) THE RESULTS THAT WILL  TRIGGER THE PERFORMANCE OF THE REFLEX TEST.  PLEASE CONTACT A  CLIENT SERVICE REPRESENTATIVE AT [facility] IF YOU WOULD  LIKE ADDITIONAL TESTING DONE OR CONTACT YOUR SALES REPRESENTATIVE  TO OBTAIN A COPY OF THE REFLEXIVE TESTING AUTHORIZATION FORM. . .    Assessment/Plan: 1. Post-thoracotomy pain syndrome Increase Xtampza to 36 mg daily. Continue Cymablta and Gabapentin. Close follow-up scheduled. Awaiting pain management assessment.   2. Anxiety and depression 3. PTSD (post-traumatic stress disorder) Deteriorating -- the pain  is worsening anxiety but the anxiety is the main exacerbating factor of his pain levels. His levels of anxiety get him to the level to where there is almost a mild psychosis -- he  cannot process anything else as he is all-consumed by panic and pain. He has not followed through with counseling citing bad interaction with specialist in [place]. He has been given numbers for psychiatric services here in the area, including practices taking walk-in assessments. Also discussed need for assessment at [facility]. He agrees to think about this and to reach out to one of these options ASAP.  4. Hypertension, unspecified type Will increase Metoprolol XL to 50 mg. Continue Losartan as directed. DASH diet reviewed again. Close follow-up scheduled.    Leeanne Rio, PA-C

## 2019-07-31 ENCOUNTER — Other Ambulatory Visit: Payer: Self-pay | Admitting: Physician Assistant

## 2019-08-02 ENCOUNTER — Ambulatory Visit: Payer: Medicaid Other | Admitting: Physician Assistant

## 2019-08-04 ENCOUNTER — Telehealth: Payer: Self-pay | Admitting: *Deleted

## 2019-08-04 NOTE — Telephone Encounter (Signed)
FYI

## 2019-08-04 NOTE — Telephone Encounter (Signed)
Patient called in to say that he wanted to give [name] an update on how he is feeling.  He states that he is vomiting more, has muscle cramps, Temperature stays at about 99 and he can't sleep.  States that he is awake every hour and a half.    He stats that the oxycodone is working better than what he had been on previously and he would like to know if he can get a refill.

## 2019-08-04 NOTE — Telephone Encounter (Signed)
Temperature normal -- no true fever present. In terms of pain, glad new dose is helping some but please find out more regarding the vomiting. We need to see if this is something acute, related to medication or something else.  In terms of sleep, related to his anxiety levels. Has he contacted any of the counseling or Psychiatric services that I gave to him at last visit?

## 2019-08-04 NOTE — Telephone Encounter (Signed)
He states he has intermittent vomiting with the severe pain. Has been ongoing.  He states the Oxycodone has improved from 10/10 to 8/10. He will be due for refill of medication.  He states he feels flushed and hot. He states he feels sore, not having muscle cramps. The soreness is in his arms, legs, buttocks. No fever His blood pressure is better when resting and relaxing.  He states his sleep-severe nightmare. He is urinating in bed when sleep.   He states he has Armed forces operational officer in [place] that accept Medicaid.

## 2019-08-05 ENCOUNTER — Emergency Department (HOSPITAL_COMMUNITY): Payer: Medicaid Other

## 2019-08-05 ENCOUNTER — Encounter (HOSPITAL_COMMUNITY): Payer: Self-pay | Admitting: Pharmacy Technician

## 2019-08-05 ENCOUNTER — Emergency Department (HOSPITAL_COMMUNITY)
Admission: EM | Admit: 2019-08-05 | Discharge: 2019-08-05 | Disposition: A | Discharge status: Home / Self Care | Payer: Medicaid Other | Attending: Emergency Medicine | Admitting: Emergency Medicine

## 2019-08-05 ENCOUNTER — Other Ambulatory Visit: Payer: Self-pay

## 2019-08-05 DIAGNOSIS — R252 Cramp and spasm: Secondary | ICD-10-CM | POA: Insufficient documentation

## 2019-08-05 DIAGNOSIS — J40 Bronchitis, not specified as acute or chronic: Secondary | ICD-10-CM | POA: Insufficient documentation

## 2019-08-05 DIAGNOSIS — R0602 Shortness of breath: Secondary | ICD-10-CM | POA: Diagnosis not present

## 2019-08-05 DIAGNOSIS — R509 Fever, unspecified: Secondary | ICD-10-CM | POA: Diagnosis present

## 2019-08-05 DIAGNOSIS — Z20828 Contact with and (suspected) exposure to other viral communicable diseases: Secondary | ICD-10-CM | POA: Insufficient documentation

## 2019-08-05 DIAGNOSIS — B349 Viral infection, unspecified: Secondary | ICD-10-CM

## 2019-08-05 DIAGNOSIS — Z79899 Other long term (current) drug therapy: Secondary | ICD-10-CM | POA: Diagnosis not present

## 2019-08-05 DIAGNOSIS — Z85528 Personal history of other malignant neoplasm of kidney: Secondary | ICD-10-CM | POA: Insufficient documentation

## 2019-08-05 DIAGNOSIS — F17210 Nicotine dependence, cigarettes, uncomplicated: Secondary | ICD-10-CM | POA: Insufficient documentation

## 2019-08-05 DIAGNOSIS — I10 Essential (primary) hypertension: Secondary | ICD-10-CM | POA: Insufficient documentation

## 2019-08-05 LAB — CBC WITH DIFFERENTIAL/PLATELET
Abs Immature Granulocytes: 0.03 10*3/uL (ref 0.00–0.07)
Basophils Absolute: 0 10*3/uL (ref 0.0–0.1)
Basophils Relative: 0 %
Eosinophils Absolute: 0.1 10*3/uL (ref 0.0–0.5)
Eosinophils Relative: 1 %
HCT: 44.1 % (ref 39.0–52.0)
Hemoglobin: 15 g/dL (ref 13.0–17.0)
Immature Granulocytes: 0 %
Lymphocytes Relative: 13 %
Lymphs Abs: 1.2 10*3/uL (ref 0.7–4.0)
MCH: 33.6 pg (ref 26.0–34.0)
MCHC: 34 g/dL (ref 30.0–36.0)
MCV: 98.9 fL (ref 80.0–100.0)
Monocytes Absolute: 0.7 10*3/uL (ref 0.1–1.0)
Monocytes Relative: 7 %
Neutro Abs: 7.4 10*3/uL (ref 1.7–7.7)
Neutrophils Relative %: 79 %
Platelets: 179 10*3/uL (ref 150–400)
RBC: 4.46 MIL/uL (ref 4.22–5.81)
RDW: 12.7 % (ref 11.5–15.5)
WBC: 9.4 10*3/uL (ref 4.0–10.5)
nRBC: 0 % (ref 0.0–0.2)

## 2019-08-05 LAB — BASIC METABOLIC PANEL
Anion gap: 10 (ref 5–15)
BUN: 8 mg/dL (ref 6–20)
CO2: 27 mmol/L (ref 22–32)
Calcium: 8.8 mg/dL — ABNORMAL LOW (ref 8.9–10.3)
Chloride: 98 mmol/L (ref 98–111)
Creatinine, Ser: 1.1 mg/dL (ref 0.61–1.24)
GFR calc Af Amer: >60 mL/min (ref >60)
GFR calc non Af Amer: >60 mL/min (ref >60)
Glucose, Bld: 115 mg/dL — ABNORMAL HIGH (ref 70–99)
Potassium: 3.9 mmol/L (ref 3.5–5.1)
Sodium: 135 mmol/L (ref 135–145)

## 2019-08-05 LAB — SARS CORONAVIRUS 2 BY RT PCR (HOSPITAL ORDER, PERFORMED IN ~~LOC~~ HOSPITAL LAB): SARS Coronavirus 2: NEGATIVE

## 2019-08-05 LAB — BRAIN NATRIURETIC PEPTIDE: B Natriuretic Peptide: 60.4 pg/mL (ref 0.0–100.0)

## 2019-08-05 MED ORDER — AZITHROMYCIN 250 MG PO TABS: 250.0000 mg | ORAL_TABLET | Freq: Every day | ORAL | 0 refills | Status: DC

## 2019-08-05 MED ORDER — METOCLOPRAMIDE HCL 5 MG/ML IJ SOLN
10.0000 mg | Freq: Once | INTRAMUSCULAR | Status: AC
  Administered 2019-08-05: 10 mg via INTRAVENOUS
  Filled 2019-08-05: qty 2

## 2019-08-05 MED ORDER — BUSPIRONE HCL 15 MG PO TABS
7.5000 mg | ORAL_TABLET | Freq: Two times a day (BID) | ORAL | Status: DC
  Filled 2019-08-05: qty 1

## 2019-08-05 MED ORDER — DIPHENHYDRAMINE HCL 50 MG/ML IJ SOLN
25.0000 mg | Freq: Once | INTRAMUSCULAR | Status: AC
  Administered 2019-08-05: 25 mg via INTRAVENOUS
  Filled 2019-08-05: qty 1

## 2019-08-05 MED ORDER — ACETAMINOPHEN 500 MG PO TABS: 1000.0000 mg | ORAL_TABLET | Freq: Four times a day (QID) | ORAL | Status: DC | PRN

## 2019-08-05 MED ORDER — LACTATED RINGERS IV BOLUS
1000.0000 mL | Freq: Once | INTRAVENOUS | Status: AC
  Administered 2019-08-05: 1000 mL via INTRAVENOUS

## 2019-08-05 MED ORDER — ALBUTEROL SULFATE HFA 108 (90 BASE) MCG/ACT IN AERS
1.0000 | INHALATION_SPRAY | Freq: Once | RESPIRATORY_TRACT | Status: AC
  Administered 2019-08-05: 2 via RESPIRATORY_TRACT
  Filled 2019-08-05: qty 6.7

## 2019-08-05 MED ORDER — IOHEXOL 350 MG/ML SOLN
100.0000 mL | Freq: Once | INTRAVENOUS | Status: AC | PRN
  Administered 2019-08-05: 100 mL via INTRAVENOUS

## 2019-08-05 MED ORDER — GABAPENTIN 300 MG PO CAPS
300.0000 mg | ORAL_CAPSULE | Freq: Three times a day (TID) | ORAL | Status: DC
  Administered 2019-08-05: 300 mg via ORAL
  Filled 2019-08-05: qty 1

## 2019-08-05 MED ORDER — BACLOFEN 10 MG PO TABS
10.0000 mg | ORAL_TABLET | Freq: Three times a day (TID) | ORAL | Status: DC
  Filled 2019-08-05: qty 1

## 2019-08-05 MED ORDER — ACETAMINOPHEN 500 MG PO TABS
1000.0000 mg | ORAL_TABLET | Freq: Once | ORAL | Status: AC
  Administered 2019-08-05: 1000 mg via ORAL
  Filled 2019-08-05: qty 2

## 2019-08-05 MED ORDER — ALBUTEROL SULFATE HFA 108 (90 BASE) MCG/ACT IN AERS: 1.0000 | INHALATION_SPRAY | Freq: Four times a day (QID) | RESPIRATORY_TRACT | Status: DC | PRN

## 2019-08-05 MED ORDER — ALPRAZOLAM 0.25 MG PO TABS
0.5000 mg | ORAL_TABLET | Freq: Three times a day (TID) | ORAL | Status: DC | PRN
  Administered 2019-08-05: 0.5 mg via ORAL
  Filled 2019-08-05: qty 2

## 2019-08-05 MED ORDER — PANTOPRAZOLE SODIUM 40 MG PO TBEC
40.0000 mg | DELAYED_RELEASE_TABLET | Freq: Every day | ORAL | Status: DC
  Administered 2019-08-05: 40 mg via ORAL
  Filled 2019-08-05: qty 1

## 2019-08-05 MED ORDER — LOSARTAN POTASSIUM 50 MG PO TABS
50.0000 mg | ORAL_TABLET | Freq: Every day | ORAL | Status: DC
  Administered 2019-08-05: 50 mg via ORAL
  Filled 2019-08-05: qty 1

## 2019-08-05 MED ORDER — METOPROLOL SUCCINATE ER 25 MG PO TB24
50.0000 mg | ORAL_TABLET | Freq: Every day | ORAL | Status: DC
  Administered 2019-08-05: 50 mg via ORAL
  Filled 2019-08-05: qty 2

## 2019-08-05 NOTE — Telephone Encounter (Signed)
Was calling patient to check on patient and get update on symptoms. Patient was currently in the ambulance on his way to the hospital. EMS transport. Patient states he woke up from a nap with vomiting and dry heaving. EMS advised to be seen at ER.

## 2019-08-05 NOTE — ED Notes (Signed)
Discharge instructions discussed with pt. Pt verbalized understanding. Pt stable and ambulatory. No signature pad available. 

## 2019-08-05 NOTE — Discharge Instructions (Signed)
Your chest CT showed evidence of a possible viral infection. It did not show anything else acute. Given that your symptoms have persisted for 3 months, you likely have bronchitis. We would like you to try a course of Azithromycin and follow up with your PCP.
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
## 2019-08-05 NOTE — Telephone Encounter (Signed)
Again in terms of severity of his anxiety, PTSD, night terrors and overall mental state -- I recommend he contact the behavioral health hospital so his care can be expedited. Since he mentioned checking in with a practice in [place] -- did he mention when they can see him or if an appointment was scheduled with Psychiatrist? Has he reached out to any of the counseling services I recommended?  In terms of generalized muscle soreness -- is this new or has this been present?   What are BP numbers currently?

## 2019-08-05 NOTE — ED Triage Notes (Signed)
Pt bib [place] ems with reports of sudden onset SOB, N/V, headache and dizziness 2 hours prior EMS arrival on scene. Pt also complain of bil flank pain. Fire reports oxygen saturation of 80% and placed pt on NRB with saturations improving to 92%. Pt arrives to the ED with [place] in place at 6L with oxygen saturations 92%. 130/90, HR 76 NSR, CBG 115, 30F oral. 22g L hand.

## 2019-08-08 DIAGNOSIS — G8912 Acute post-thoracotomy pain: Secondary | ICD-10-CM | POA: Insufficient documentation

## 2019-08-09 ENCOUNTER — Telehealth: Payer: Self-pay

## 2019-08-09 ENCOUNTER — Other Ambulatory Visit: Payer: Self-pay

## 2019-08-09 ENCOUNTER — Other Ambulatory Visit: Payer: Self-pay | Admitting: Physician Assistant

## 2019-08-09 MED ORDER — XTAMPZA ER 36 MG PO C12A: 1.0000 | EXTENDED_RELEASE_CAPSULE | Freq: Two times a day (BID) | ORAL | 0 refills | Status: DC

## 2019-08-09 NOTE — Telephone Encounter (Signed)
Patient called in requesting a refill for his oxyCODONE ER 36 MG. Patient was struggling to complete sentences through out the phone call. Routing message to PCP for medication approval.

## 2019-08-09 NOTE — Telephone Encounter (Signed)
Please call to reassess patient. Refill given but for 14 day supply. He has follow-up with me tomorrow but if anything seems off with his level of alertness, will need to have emergent assessment

## 2019-08-09 NOTE — Telephone Encounter (Signed)
Patient called back in stating that he needed his pain medication called in because he was in a lot of pain. Started to list various numbers of his vital signs in random order. Stated that his BP is "very high" and his temp has been 101 since Thursday. Girlfriend in background telling patient what to say. Informed patient that if he felt like he needed to be seen tonight, he would have to go to [facility] or ER for assessment. If he felt like he could wait until his appt tomorrow, he and [name] could discuss all his current symptoms. Once I informed patient that his pain medication had been sent to pharmacy, patient seemed to perk up quite a bit.

## 2019-08-10 ENCOUNTER — Encounter: Payer: Self-pay | Admitting: Physician Assistant

## 2019-08-10 ENCOUNTER — Telehealth: Payer: Self-pay | Admitting: *Deleted

## 2019-08-10 ENCOUNTER — Other Ambulatory Visit: Payer: Self-pay | Admitting: *Deleted

## 2019-08-10 ENCOUNTER — Other Ambulatory Visit: Payer: Self-pay

## 2019-08-10 ENCOUNTER — Ambulatory Visit (INDEPENDENT_AMBULATORY_CARE_PROVIDER_SITE_OTHER): Payer: Medicaid Other | Admitting: Physician Assistant

## 2019-08-10 VITALS — BP 158/104 | HR 102 | Temp 100.3°F

## 2019-08-10 DIAGNOSIS — G8912 Acute post-thoracotomy pain: Secondary | ICD-10-CM | POA: Diagnosis not present

## 2019-08-10 DIAGNOSIS — B9689 Other specified bacterial agents as the cause of diseases classified elsewhere: Secondary | ICD-10-CM

## 2019-08-10 DIAGNOSIS — K5903 Drug induced constipation: Secondary | ICD-10-CM | POA: Diagnosis not present

## 2019-08-10 DIAGNOSIS — F4310 Post-traumatic stress disorder, unspecified: Secondary | ICD-10-CM

## 2019-08-10 DIAGNOSIS — B349 Viral infection, unspecified: Secondary | ICD-10-CM

## 2019-08-10 DIAGNOSIS — J208 Acute bronchitis due to other specified organisms: Secondary | ICD-10-CM

## 2019-08-10 MED ORDER — ALBUTEROL SULFATE HFA 108 (90 BASE) MCG/ACT IN AERS: 1.0000 | INHALATION_SPRAY | Freq: Four times a day (QID) | RESPIRATORY_TRACT | 3 refills | Status: DC | PRN

## 2019-08-10 MED ORDER — PANTOPRAZOLE SODIUM 40 MG PO TBEC: 40.0000 mg | DELAYED_RELEASE_TABLET | Freq: Every day | ORAL | 3 refills | Status: DC

## 2019-08-10 MED ORDER — ASMANEX (60 METERED DOSES) 220 MCG/INH IN AEPB: 1.0000 | INHALATION_SPRAY | Freq: Two times a day (BID) | RESPIRATORY_TRACT | 12 refills | Status: DC

## 2019-08-10 NOTE — Telephone Encounter (Signed)
Pharmacy called for diagnosis code for azithromycin.  EDCM reviewed chart to find viral bronchitis.

## 2019-08-10 NOTE — Patient Instructions (Addendum)
Instructions sent to MyChart.   Please keep well-hydrated and get plenty of rest. Make sure to pick up the antibiotic given by the ER provider and take as directed. Pick up your inhalers at the same time.   I encourage you to increase hydration and the amount of fiber in your diet.  Start a daily probiotic (Align, Culturelle, Digestive Advantage, etc.). If no bowel movement within 24 hours, take 2 Tbs of Milk of Magnesia in a 4 oz glass of warmed prune juice every 2-3 days to help promote bowel movement. If no results within 24 hours, then repeat above regimen, adding a Dulcolax stool softener to regimen. If this does not promote a bowel movement, please call the office.
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
## 2019-08-10 NOTE — Telephone Encounter (Signed)
Noted. Will discuss at visit today. Per his ER note review -- BP normotensive during stay and afebrile during entire stay. Will be getting UDS before any further medication given.

## 2019-08-10 NOTE — Progress Notes (Signed)
I have discussed the procedure for the virtual visit with the patient who has given consent to proceed with assessment and treatment.   Ryanna Teschner S Trude Cansler, CMA     

## 2019-08-11 ENCOUNTER — Telehealth: Payer: Self-pay | Admitting: Physician Assistant

## 2019-08-11 NOTE — Telephone Encounter (Signed)
Can recheck again in 1 week -- lab only appt. Orders already in system

## 2019-08-11 NOTE — Telephone Encounter (Signed)
Pt called in asking when is he to have BW done again or STDs

## 2019-08-11 NOTE — Telephone Encounter (Signed)
LM making pt aware that he can schedule a lab only visit and that he needs to reach out to pain management to to records sent to [place] at [phone]  Phone # [phone]

## 2019-08-12 ENCOUNTER — Other Ambulatory Visit: Payer: Self-pay | Admitting: Physician Assistant

## 2019-08-12 DIAGNOSIS — F4310 Post-traumatic stress disorder, unspecified: Secondary | ICD-10-CM | POA: Insufficient documentation

## 2019-08-12 DIAGNOSIS — R030 Elevated blood-pressure reading, without diagnosis of hypertension: Secondary | ICD-10-CM

## 2019-08-12 DIAGNOSIS — I10 Essential (primary) hypertension: Secondary | ICD-10-CM | POA: Insufficient documentation

## 2019-08-14 ENCOUNTER — Other Ambulatory Visit: Payer: Self-pay | Admitting: Physician Assistant

## 2019-08-19 ENCOUNTER — Other Ambulatory Visit: Payer: Self-pay | Admitting: Physician Assistant

## 2019-08-20 ENCOUNTER — Telehealth: Payer: Self-pay | Admitting: Physician Assistant

## 2019-08-20 MED ORDER — XTAMPZA ER 36 MG PO C12A: 1.0000 | EXTENDED_RELEASE_CAPSULE | Freq: Two times a day (BID) | ORAL | 0 refills | Status: DC

## 2019-08-20 NOTE — Telephone Encounter (Signed)
Patient should not be out of medication until Sunday. Rx sent today for fill when due.

## 2019-08-20 NOTE — Addendum Note (Signed)
Addended by: Brunetta Jeans on: 08/20/2019 03:37 PM   Modules accepted: Orders

## 2019-08-20 NOTE — Telephone Encounter (Signed)
oxyCODONE ER (XTAMPZA ER) 36 MG C12A  Patient said he out of the medication and would like a refill before the weekend. Patient said if there is any issues to give him a call. Please advise.

## 2019-08-23 ENCOUNTER — Other Ambulatory Visit: Payer: Self-pay

## 2019-08-23 ENCOUNTER — Telehealth: Payer: Self-pay | Admitting: Physician Assistant

## 2019-08-23 ENCOUNTER — Ambulatory Visit (INDEPENDENT_AMBULATORY_CARE_PROVIDER_SITE_OTHER): Payer: Medicaid Other

## 2019-08-23 DIAGNOSIS — Z202 Contact with and (suspected) exposure to infections with a predominantly sexual mode of transmission: Secondary | ICD-10-CM | POA: Diagnosis not present

## 2019-08-23 NOTE — Telephone Encounter (Signed)
Der called from [facility] stating that they were only about to fill 48 tabs of the Xtampza. She wanted to make [name] know that he is going to lose the rest of them.

## 2019-08-24 ENCOUNTER — Other Ambulatory Visit: Payer: Self-pay | Admitting: Physician Assistant

## 2019-08-24 ENCOUNTER — Telehealth: Payer: Self-pay | Admitting: Physician Assistant

## 2019-08-24 MED ORDER — PROMETHAZINE HCL 25 MG PO TABS: 25.0000 mg | ORAL_TABLET | Freq: Four times a day (QID) | ORAL | 0 refills | Status: DC | PRN

## 2019-08-24 NOTE — Telephone Encounter (Signed)
Xanax last rx 07/31/19 #60 LOV: 08/10/19 CSC: 12/22/18

## 2019-08-24 NOTE — Telephone Encounter (Signed)
Pt called in asking for a refill on the nausea medication. He states that he is out of the medication.  Pt can be reached at the home #

## 2019-08-25 ENCOUNTER — Telehealth: Payer: Self-pay | Admitting: Physician Assistant

## 2019-08-25 LAB — HSV(HERPES SMPLX)ABS-I+II(IGG+IGM)-BLD
HSV 1 Glycoprotein G Ab, IgG: 55 index — ABNORMAL HIGH (ref 0.00–0.90)
HSV 2 IgG, Type Spec: <0.91 index (ref 0.00–0.90)
HSVI/II Comb IgM: <0.91 Ratio (ref 0.00–0.90)

## 2019-08-25 NOTE — Telephone Encounter (Signed)
Pt called in asking for lab results, please call the home #

## 2019-08-25 NOTE — Telephone Encounter (Signed)
Patient has been contacted with HSV results.

## 2019-09-08 ENCOUNTER — Other Ambulatory Visit: Payer: Self-pay | Admitting: Physician Assistant

## 2019-09-09 ENCOUNTER — Telehealth: Payer: Self-pay | Admitting: *Deleted

## 2019-09-09 NOTE — Telephone Encounter (Signed)
Erroneous Encounter

## 2019-09-10 ENCOUNTER — Other Ambulatory Visit: Payer: Self-pay

## 2019-09-10 ENCOUNTER — Telehealth: Payer: Self-pay | Admitting: *Deleted

## 2019-09-10 ENCOUNTER — Emergency Department (HOSPITAL_COMMUNITY)
Admission: EM | Admit: 2019-09-10 | Discharge: 2019-09-10 | Disposition: A | Discharge status: Home / Self Care | Payer: Medicaid Other | Attending: Emergency Medicine | Admitting: Emergency Medicine

## 2019-09-10 ENCOUNTER — Ambulatory Visit: Payer: Medicaid Other | Admitting: Physician Assistant

## 2019-09-10 ENCOUNTER — Encounter (HOSPITAL_COMMUNITY): Payer: Self-pay | Admitting: Emergency Medicine

## 2019-09-10 ENCOUNTER — Other Ambulatory Visit: Payer: Self-pay | Admitting: Physician Assistant

## 2019-09-10 DIAGNOSIS — F17210 Nicotine dependence, cigarettes, uncomplicated: Secondary | ICD-10-CM | POA: Insufficient documentation

## 2019-09-10 DIAGNOSIS — R110 Nausea: Secondary | ICD-10-CM

## 2019-09-10 DIAGNOSIS — R197 Diarrhea, unspecified: Secondary | ICD-10-CM | POA: Diagnosis not present

## 2019-09-10 DIAGNOSIS — Z79899 Other long term (current) drug therapy: Secondary | ICD-10-CM | POA: Insufficient documentation

## 2019-09-10 DIAGNOSIS — Z20828 Contact with and (suspected) exposure to other viral communicable diseases: Secondary | ICD-10-CM | POA: Diagnosis not present

## 2019-09-10 DIAGNOSIS — R112 Nausea with vomiting, unspecified: Secondary | ICD-10-CM

## 2019-09-10 LAB — COMPREHENSIVE METABOLIC PANEL
ALT: 28 U/L (ref 0–44)
AST: 21 U/L (ref 15–41)
Albumin: 3.7 g/dL (ref 3.5–5.0)
Alkaline Phosphatase: 59 U/L (ref 38–126)
Anion gap: 11 (ref 5–15)
BUN: 6 mg/dL (ref 6–20)
CO2: 28 mmol/L (ref 22–32)
Calcium: 9 mg/dL (ref 8.9–10.3)
Chloride: 99 mmol/L (ref 98–111)
Creatinine, Ser: 0.98 mg/dL (ref 0.61–1.24)
GFR calc Af Amer: >60 mL/min (ref >60)
GFR calc non Af Amer: >60 mL/min (ref >60)
Glucose, Bld: 107 mg/dL — ABNORMAL HIGH (ref 70–99)
Potassium: 4.1 mmol/L (ref 3.5–5.1)
Sodium: 138 mmol/L (ref 135–145)
Total Bilirubin: 0.7 mg/dL (ref 0.3–1.2)
Total Protein: 6.6 g/dL (ref 6.5–8.1)

## 2019-09-10 LAB — CBC
HCT: 43.1 % (ref 39.0–52.0)
Hemoglobin: 14.1 g/dL (ref 13.0–17.0)
MCH: 32.3 pg (ref 26.0–34.0)
MCHC: 32.7 g/dL (ref 30.0–36.0)
MCV: 98.9 fL (ref 80.0–100.0)
Platelets: 161 10*3/uL (ref 150–400)
RBC: 4.36 MIL/uL (ref 4.22–5.81)
RDW: 12.5 % (ref 11.5–15.5)
WBC: 7 10*3/uL (ref 4.0–10.5)
nRBC: 0 % (ref 0.0–0.2)

## 2019-09-10 LAB — URINALYSIS, ROUTINE W REFLEX MICROSCOPIC
Bilirubin Urine: NEGATIVE
Glucose, UA: NEGATIVE mg/dL
Hgb urine dipstick: NEGATIVE
Ketones, ur: NEGATIVE mg/dL
Leukocytes,Ua: NEGATIVE
Nitrite: NEGATIVE
Protein, ur: NEGATIVE mg/dL
Specific Gravity, Urine: 1.006 (ref 1.005–1.030)
pH: 6 (ref 5.0–8.0)

## 2019-09-10 LAB — LIPASE, BLOOD: Lipase: 20 U/L (ref 11–51)

## 2019-09-10 LAB — SARS CORONAVIRUS 2 (TAT 6-24 HRS): SARS Coronavirus 2: NEGATIVE

## 2019-09-10 MED ORDER — FENTANYL CITRATE (PF) 100 MCG/2ML IJ SOLN
50.0000 ug | Freq: Once | INTRAMUSCULAR | Status: AC
  Administered 2019-09-10: 50 ug via INTRAVENOUS
  Filled 2019-09-10: qty 2

## 2019-09-10 MED ORDER — METOCLOPRAMIDE HCL 5 MG/ML IJ SOLN
10.0000 mg | Freq: Once | INTRAMUSCULAR | Status: AC
  Administered 2019-09-10: 10 mg via INTRAVENOUS
  Filled 2019-09-10: qty 2

## 2019-09-10 MED ORDER — DOCUSATE SODIUM 100 MG PO CAPS: 100.0000 mg | ORAL_CAPSULE | Freq: Two times a day (BID) | ORAL | 0 refills | Status: DC

## 2019-09-10 MED ORDER — GABAPENTIN 300 MG PO CAPS
300.0000 mg | ORAL_CAPSULE | Freq: Once | ORAL | Status: AC
  Administered 2019-09-10: 300 mg via ORAL
  Filled 2019-09-10: qty 1

## 2019-09-10 MED ORDER — OXYCODONE HCL 5 MG PO TABS
5.0000 mg | ORAL_TABLET | Freq: Once | ORAL | Status: AC
  Administered 2019-09-10: 5 mg via ORAL
  Filled 2019-09-10: qty 1

## 2019-09-10 MED ORDER — BACLOFEN 10 MG PO TABS
10.0000 mg | ORAL_TABLET | Freq: Three times a day (TID) | ORAL | Status: DC
  Administered 2019-09-10: 07:00:00 10 mg via ORAL
  Filled 2019-09-10 (×2): qty 1

## 2019-09-10 MED ORDER — SODIUM CHLORIDE 0.9% FLUSH: 3.0000 mL | Freq: Once | INTRAVENOUS | Status: DC

## 2019-09-10 MED ORDER — LACTATED RINGERS IV BOLUS
1000.0000 mL | Freq: Once | INTRAVENOUS | Status: AC
  Administered 2019-09-10: 1000 mL via INTRAVENOUS

## 2019-09-10 NOTE — ED Notes (Signed)
Pt has ambulated to bathroom with no assitance needed. Pt had BM.

## 2019-09-10 NOTE — ED Notes (Signed)
Discharge instructions discussed with pt and family at bedside. No questions at this time. Pt ambulatory.

## 2019-09-10 NOTE — Discharge Instructions (Addendum)

## 2019-09-10 NOTE — ED Triage Notes (Signed)
Pt via [facility] from home c/o NVD and body aches for the past two days. Pt states emesis x12 in past 24 hours. Pt states constipation today. Generalized body aches mostly in joint rating 10 out of 10 pain. Pt states fevers come and go.   HX chronic abdominal pain r/t rib fractures and chronic diarrhea.
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
## 2019-09-10 NOTE — Telephone Encounter (Signed)
Patient called in stating that he missed his appointment. I advised that we would have to schedule something for next week.  He started crying and asking if there was a way that I could call or text [name] and ask him medical questions on what he needs to do to feel better. I advised that I could not do that, that he should have kept his appointment this morning.   He was in tears stating that he is just vomiting and crying and was told from the hospital to follow up with PCP and not the hospital.

## 2019-09-11 ENCOUNTER — Other Ambulatory Visit: Payer: Self-pay | Admitting: Physician Assistant

## 2019-09-13 ENCOUNTER — Encounter: Payer: Self-pay | Admitting: Physician Assistant

## 2019-09-13 ENCOUNTER — Ambulatory Visit (INDEPENDENT_AMBULATORY_CARE_PROVIDER_SITE_OTHER): Payer: Medicaid Other | Admitting: Physician Assistant

## 2019-09-13 ENCOUNTER — Other Ambulatory Visit: Payer: Self-pay

## 2019-09-13 DIAGNOSIS — J208 Acute bronchitis due to other specified organisms: Secondary | ICD-10-CM | POA: Diagnosis not present

## 2019-09-13 DIAGNOSIS — B349 Viral infection, unspecified: Secondary | ICD-10-CM

## 2019-09-13 DIAGNOSIS — B9689 Other specified bacterial agents as the cause of diseases classified elsewhere: Secondary | ICD-10-CM | POA: Diagnosis not present

## 2019-09-13 DIAGNOSIS — G8912 Acute post-thoracotomy pain: Secondary | ICD-10-CM | POA: Diagnosis not present

## 2019-09-13 MED ORDER — BENZONATATE 100 MG PO CAPS: 100.0000 mg | ORAL_CAPSULE | Freq: Three times a day (TID) | ORAL | 0 refills | Status: DC | PRN

## 2019-09-13 MED ORDER — XTAMPZA ER 36 MG PO C12A: 1.0000 | EXTENDED_RELEASE_CAPSULE | Freq: Two times a day (BID) | ORAL | 0 refills | Status: DC

## 2019-09-13 MED ORDER — DOXYCYCLINE HYCLATE 100 MG PO TABS: 100.0000 mg | ORAL_TABLET | Freq: Two times a day (BID) | ORAL | 0 refills | Status: DC

## 2019-09-13 NOTE — Patient Instructions (Signed)
Instructions sent to MyChart.  I have sent refill of pain medication to the pharmacy. I will fill out the PA forms for you today.   I am glad that overall things are improving.  Keep hydrated and try to get plenty of rest.  Take antibiotic (Doxycycline) as directed.  Increase fluids.  Get plenty of rest. Use Mucinex for congestion. Tessalon for cough. Take a daily probiotic (I recommend Align or Culturelle, but even Activia Yogurt may be beneficial).  A humidifier placed in the bedroom may offer some relief for a dry, scratchy throat of nasal irritation.  Read information below on acute bronchitis. Please call or return to clinic if symptoms are not improving.  Acute Bronchitis Bronchitis is when the airways that extend from the windpipe into the lungs get red, puffy, and painful (inflamed). Bronchitis often causes thick spit (mucus) to develop. This leads to a cough. A cough is the most common symptom of bronchitis. In acute bronchitis, the condition usually begins suddenly and goes away over time (usually in 2 weeks). Smoking, allergies, and asthma can make bronchitis worse. Repeated episodes of bronchitis may cause more lung problems.  HOME CARE  Rest.  Drink enough fluids to keep your pee (urine) clear or pale yellow (unless you need to limit fluids as told by your doctor).  Only take over-the-counter or prescription medicines as told by your doctor.  Avoid smoking and secondhand smoke. These can make bronchitis worse. If you are a smoker, think about using nicotine gum or skin patches. Quitting smoking will help your lungs heal faster.  Reduce the chance of getting bronchitis again by:  Washing your hands often.  Avoiding people with cold symptoms.  Trying not to touch your hands to your mouth, nose, or eyes.  Follow up with your doctor as told.  GET HELP IF: Your symptoms do not improve after 1 week of treatment. Symptoms include:  Cough.  Fever.  Coughing up thick spit.   Body aches.  Chest congestion.  Chills.  Shortness of breath.  Sore throat.  GET HELP RIGHT AWAY IF:   You have an increased fever.  You have chills.  You have severe shortness of breath.  You have bloody thick spit (sputum).  You throw up (vomit) often.  You lose too much body fluid (dehydration).  You have a severe headache.  You faint.  MAKE SURE YOU:   Understand these instructions.  Will watch your condition.  Will get help right away if you are not doing well or get worse. Document Released: 03/25/2008 Document Revised: 06/09/2013 Document Reviewed: 03/30/2013 [facility] Patient Information 2015 [place], [place]. This information is not intended to replace advice given to you by your health care provider. Make sure you discuss any questions you have with your health care provider.
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
## 2019-09-13 NOTE — Telephone Encounter (Signed)
Patient has appt today. Will discuss at time of visit. I cannot help that he no-showed for his appointment and by the time he called back at 3 we were full and I was not in office.

## 2019-09-13 NOTE — Progress Notes (Signed)
I have discussed the procedure for the virtual visit with the patient who has given consent to proceed with assessment and treatment.   Luanne Krzyzanowski S Jailyn Langhorst, CMA     

## 2019-09-23 ENCOUNTER — Encounter: Payer: Self-pay | Admitting: Psychology

## 2019-09-23 ENCOUNTER — Encounter

## 2019-09-23 ENCOUNTER — Other Ambulatory Visit: Payer: Self-pay | Admitting: Physician Assistant

## 2019-09-23 ENCOUNTER — Other Ambulatory Visit: Payer: Self-pay

## 2019-09-23 ENCOUNTER — Encounter: Payer: Medicaid Other | Attending: Psychology | Admitting: Psychology

## 2019-09-23 DIAGNOSIS — F419 Anxiety disorder, unspecified: Secondary | ICD-10-CM | POA: Insufficient documentation

## 2019-09-23 DIAGNOSIS — F4310 Post-traumatic stress disorder, unspecified: Secondary | ICD-10-CM | POA: Insufficient documentation

## 2019-09-23 DIAGNOSIS — G8912 Acute post-thoracotomy pain: Secondary | ICD-10-CM

## 2019-09-23 DIAGNOSIS — F329 Major depressive disorder, single episode, unspecified: Secondary | ICD-10-CM

## 2019-09-23 DIAGNOSIS — F32A Depression, unspecified: Secondary | ICD-10-CM
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
## 2019-09-23 NOTE — Telephone Encounter (Signed)
Pt called in asking for refills on the Xanax and phenergan to be sent to the [facility] in [place]

## 2019-09-24 MED ORDER — ONDANSETRON 8 MG PO TBDP: 8.0000 mg | ORAL_TABLET | Freq: Three times a day (TID) | ORAL | 0 refills | Status: DC | PRN

## 2019-09-24 MED ORDER — ALPRAZOLAM 1 MG PO TABS: ORAL_TABLET | ORAL | 0 refills | Status: DC

## 2019-09-24 NOTE — Telephone Encounter (Signed)
Xanax last rx 08/25/19 #60 LOV: 09/13/19 CSC: 12/22/18

## 2019-09-27 ENCOUNTER — Telehealth: Payer: Self-pay | Admitting: Emergency Medicine

## 2019-09-27 NOTE — Telephone Encounter (Signed)
Again he will have to have [facility] be his PCP until he gets Medicaid to switch who is listed as PCP.  We cannot refer him to where he needs to be since he has Medicaid, something that has been discussed multiple times at this point, by multiple staff members and myself. If he were just dealing with routine things then we could certainly take care of this but his current issues requires specialists from multiple disciplines and his insurance will not accept referrals from our practice.  As such he will need to have [facility] refer him to a psychiatrist as well as take over his medication management as new PCP until they can get him in with specialists. We have tried multiple medications without improvement in these symptoms which is why we have been trying to get him in with a specialist. He did not follow through with the recommendations previously given by this provider for seeking care with behavioral health to do their OP program.   If his insurance changes again then we would be able to fully care for him but his current insurance limits what I can do.

## 2019-09-27 NOTE — Telephone Encounter (Signed)
[facility] is on his Medicaid card. He has appointment today with them at 1 pm. He is getting a referral to Pain management. He doesn't want them to be his PCP. He wants a better PCP that accepts his Medicaid.  Had episode of night terrors, wet the bed, having episodes of wetting his pants, dizziness, out of it. His next appt is in Jan unless they have a cancellation.  Fibromyalgia is the dx the Neuropsychiatrist gave him. He doesn't prescribe medication.

## 2019-09-28 ENCOUNTER — Other Ambulatory Visit: Payer: Self-pay

## 2019-09-28 ENCOUNTER — Ambulatory Visit (INDEPENDENT_AMBULATORY_CARE_PROVIDER_SITE_OTHER): Payer: Medicaid Other | Admitting: Physician Assistant

## 2019-09-28 ENCOUNTER — Encounter: Payer: Self-pay | Admitting: Physician Assistant

## 2019-09-28 DIAGNOSIS — N3944 Nocturnal enuresis: Secondary | ICD-10-CM

## 2019-09-28 DIAGNOSIS — F329 Major depressive disorder, single episode, unspecified: Secondary | ICD-10-CM

## 2019-09-28 DIAGNOSIS — F419 Anxiety disorder, unspecified: Secondary | ICD-10-CM

## 2019-09-28 DIAGNOSIS — F32A Depression, unspecified: Secondary | ICD-10-CM

## 2019-09-28 DIAGNOSIS — G8912 Acute post-thoracotomy pain: Secondary | ICD-10-CM | POA: Diagnosis not present

## 2019-09-28 DIAGNOSIS — F4310 Post-traumatic stress disorder, unspecified: Secondary | ICD-10-CM | POA: Diagnosis not present

## 2019-09-28 NOTE — Patient Instructions (Addendum)
Instructions sent to MyChart.   You need to discuss these symptoms with the provider at [facility] (PCP on your Medicaid card) since you are seeing them later today in person. They can examine you, check urine and order the appropriate imaging.   Please make sure they refer you to pain medicine -- I do recommend Dr. Dagoberto Ligas that was also recommended to you by the Neuropsychologist at [facility].  Make sure to ask about Psychiatry referral for PTSD since your insurance will not approve referrals from our office. [facility] is a Network [facility] we use a lot and have great providers. Also Dr. Toy Care ir fantastic.   ER for any worsening symptoms.
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
## 2019-09-28 NOTE — Progress Notes (Signed)
I have discussed the procedure for the virtual visit with the patient who has given consent to proceed with assessment and treatment.   Keishaun Hazel S Obaloluwa Delatte, CMA     

## 2019-10-01 ENCOUNTER — Other Ambulatory Visit: Payer: Self-pay | Admitting: Physician Assistant

## 2019-10-01 NOTE — Telephone Encounter (Signed)
OK to refill

## 2019-10-07 ENCOUNTER — Other Ambulatory Visit: Payer: Self-pay | Admitting: Physician Assistant

## 2019-10-07 DIAGNOSIS — G8912 Acute post-thoracotomy pain: Secondary | ICD-10-CM

## 2019-10-07 NOTE — Telephone Encounter (Signed)
Pt called in asking for a refill on the Xtampza 36 mg, pt states he will be out tomorrow and he know that the insurance will require a PA. Pt can be reached at the home # and is aware that [name] is already gone for today.

## 2019-10-07 NOTE — Telephone Encounter (Signed)
Oxycodone ER 36 mg last rx 09/13/19 #60 CSC: 12/22/18 UDS: 12/22/18 LOV: 09/28/19

## 2019-10-08 MED ORDER — XTAMPZA ER 36 MG PO C12A: 1.0000 | EXTENDED_RELEASE_CAPSULE | Freq: Two times a day (BID) | ORAL | 0 refills | Status: DC

## 2019-10-08 NOTE — Telephone Encounter (Signed)
Patient called in stating that he is scheduled to see pain management next week. Until then, he is completely out of medication.

## 2019-10-08 NOTE — Telephone Encounter (Signed)
Per PDMP review. Patient is not due until next Tuesday for refill. He should not be out of medication based on quantity given and fill dates. As such pharmacy will not refill until due.

## 2019-10-08 NOTE — Addendum Note (Signed)
Addended by: Brunetta Jeans on: 10/08/2019 11:04 AM   Modules accepted: Orders

## 2019-10-12 ENCOUNTER — Other Ambulatory Visit: Payer: Self-pay | Admitting: Physician Assistant

## 2019-10-12 DIAGNOSIS — F329 Major depressive disorder, single episode, unspecified: Secondary | ICD-10-CM

## 2019-10-12 DIAGNOSIS — F32A Depression, unspecified: Secondary | ICD-10-CM

## 2019-10-14 ENCOUNTER — Other Ambulatory Visit: Payer: Self-pay | Admitting: Physician Assistant

## 2019-10-20 ENCOUNTER — Other Ambulatory Visit: Payer: Self-pay

## 2019-10-20 ENCOUNTER — Emergency Department (HOSPITAL_BASED_OUTPATIENT_CLINIC_OR_DEPARTMENT_OTHER)
Admission: EM | Admit: 2019-10-20 | Discharge: 2019-10-20 | Disposition: A | Discharge status: Home / Self Care | Payer: Medicaid Other | Attending: Emergency Medicine | Admitting: Emergency Medicine

## 2019-10-20 ENCOUNTER — Encounter (HOSPITAL_BASED_OUTPATIENT_CLINIC_OR_DEPARTMENT_OTHER): Payer: Self-pay | Admitting: *Deleted

## 2019-10-20 ENCOUNTER — Emergency Department (HOSPITAL_BASED_OUTPATIENT_CLINIC_OR_DEPARTMENT_OTHER): Payer: Medicaid Other

## 2019-10-20 DIAGNOSIS — G894 Chronic pain syndrome: Secondary | ICD-10-CM | POA: Diagnosis not present

## 2019-10-20 DIAGNOSIS — Z85528 Personal history of other malignant neoplasm of kidney: Secondary | ICD-10-CM | POA: Insufficient documentation

## 2019-10-20 DIAGNOSIS — R079 Chest pain, unspecified: Secondary | ICD-10-CM | POA: Diagnosis present

## 2019-10-20 DIAGNOSIS — F17210 Nicotine dependence, cigarettes, uncomplicated: Secondary | ICD-10-CM | POA: Insufficient documentation

## 2019-10-20 DIAGNOSIS — F0781 Postconcussional syndrome: Secondary | ICD-10-CM

## 2019-10-20 DIAGNOSIS — Z79899 Other long term (current) drug therapy: Secondary | ICD-10-CM | POA: Insufficient documentation

## 2019-10-20 DIAGNOSIS — R42 Dizziness and giddiness: Secondary | ICD-10-CM | POA: Diagnosis not present

## 2019-10-20 DIAGNOSIS — I10 Essential (primary) hypertension: Secondary | ICD-10-CM | POA: Diagnosis not present

## 2019-10-20 LAB — COMPREHENSIVE METABOLIC PANEL
ALT: 23 U/L (ref 0–44)
AST: 20 U/L (ref 15–41)
Albumin: 4.2 g/dL (ref 3.5–5.0)
Alkaline Phosphatase: 58 U/L (ref 38–126)
Anion gap: 9 (ref 5–15)
BUN: 10 mg/dL (ref 6–20)
CO2: 23 mmol/L (ref 22–32)
Calcium: 9.3 mg/dL (ref 8.9–10.3)
Chloride: 106 mmol/L (ref 98–111)
Creatinine, Ser: 0.75 mg/dL (ref 0.61–1.24)
GFR calc Af Amer: >60 mL/min (ref >60)
GFR calc non Af Amer: >60 mL/min (ref >60)
Glucose, Bld: 98 mg/dL (ref 70–99)
Potassium: 3.9 mmol/L (ref 3.5–5.1)
Sodium: 138 mmol/L (ref 135–145)
Total Bilirubin: 0.7 mg/dL (ref 0.3–1.2)
Total Protein: 7.1 g/dL (ref 6.5–8.1)

## 2019-10-20 LAB — URINALYSIS, ROUTINE W REFLEX MICROSCOPIC
Bilirubin Urine: NEGATIVE
Glucose, UA: NEGATIVE mg/dL
Hgb urine dipstick: NEGATIVE
Ketones, ur: NEGATIVE mg/dL
Leukocytes,Ua: NEGATIVE
Nitrite: NEGATIVE
Protein, ur: NEGATIVE mg/dL
Specific Gravity, Urine: 1.015 (ref 1.005–1.030)
pH: 7 (ref 5.0–8.0)

## 2019-10-20 LAB — CBC WITH DIFFERENTIAL/PLATELET
Abs Immature Granulocytes: 0.02 10*3/uL (ref 0.00–0.07)
Basophils Absolute: 0 10*3/uL (ref 0.0–0.1)
Basophils Relative: 1 %
Eosinophils Absolute: 0.3 10*3/uL (ref 0.0–0.5)
Eosinophils Relative: 4 %
HCT: 46.9 % (ref 39.0–52.0)
Hemoglobin: 15.9 g/dL (ref 13.0–17.0)
Immature Granulocytes: 0 %
Lymphocytes Relative: 24 %
Lymphs Abs: 1.9 10*3/uL (ref 0.7–4.0)
MCH: 31.9 pg (ref 26.0–34.0)
MCHC: 33.9 g/dL (ref 30.0–36.0)
MCV: 94.2 fL (ref 80.0–100.0)
Monocytes Absolute: 0.5 10*3/uL (ref 0.1–1.0)
Monocytes Relative: 7 %
Neutro Abs: 4.9 10*3/uL (ref 1.7–7.7)
Neutrophils Relative %: 64 %
Platelets: 244 10*3/uL (ref 150–400)
RBC: 4.98 MIL/uL (ref 4.22–5.81)
RDW: 12.6 % (ref 11.5–15.5)
WBC: 7.6 10*3/uL (ref 4.0–10.5)
nRBC: 0 % (ref 0.0–0.2)

## 2019-10-20 MED ORDER — OXYCODONE-ACETAMINOPHEN 5-325 MG PO TABS
2.0000 | ORAL_TABLET | Freq: Once | ORAL | Status: AC
  Administered 2019-10-20: 20:00:00 2 via ORAL
  Filled 2019-10-20: qty 2

## 2019-10-20 NOTE — ED Notes (Signed)
Pt continues to report pain back, general body aches. Provider aware.

## 2019-10-20 NOTE — ED Triage Notes (Addendum)
Pt c/o chest pain x 5 months , sent here from PMD office for eval, pt states out of pain meds x 5 days , waiting for pan management appt
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
## 2019-10-21 ENCOUNTER — Other Ambulatory Visit: Payer: Self-pay | Admitting: Physician Assistant

## 2019-10-21 DIAGNOSIS — F329 Major depressive disorder, single episode, unspecified: Secondary | ICD-10-CM

## 2019-10-21 DIAGNOSIS — F32A Depression, unspecified: Secondary | ICD-10-CM

## 2019-10-21 NOTE — Telephone Encounter (Signed)
Last refill: 12.4.20 #60, 0 Last OV: 12.8.20 dx. PTSD

## 2019-10-26 ENCOUNTER — Telehealth: Payer: Self-pay | Admitting: Emergency Medicine

## 2019-10-26 ENCOUNTER — Encounter: Payer: Self-pay | Admitting: Physician Assistant
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
## 2019-10-26 NOTE — Telephone Encounter (Signed)
Advised patient of recommendations. Advised patient he was given a rx for Percocet on 12/31. So I assume patient is requesting medications again and [facility] is not refilling medication. He didn't tell me how he was taking the Percocet's but advised no controlled medications will be prescribed from our office.

## 2019-10-26 NOTE — Telephone Encounter (Signed)
Patient calling today stating that he has a referral to Pain Specialist but they have not scheduled. Appointments are 2-3 weeks out. [facility]  Patient did go to the ER on 10/20/19 and was given rx for Percocet 2 tablets.   He is needing a refill of all medications.  The pain specialist will not fill until he is seen which is not scheduled yet Patient blood pressure has been elevated as well 205/116  Please advise

## 2019-10-27 ENCOUNTER — Encounter: Payer: Self-pay | Admitting: Physician Assistant

## 2019-10-27 NOTE — Telephone Encounter (Signed)
Dismissal letter has been printed and mailed to the patient.

## 2019-11-11 ENCOUNTER — Encounter: Payer: Medicaid Other | Attending: Psychology | Admitting: Psychology

## 2019-11-11 ENCOUNTER — Encounter: Payer: Self-pay | Admitting: Psychology

## 2019-11-11 ENCOUNTER — Other Ambulatory Visit: Payer: Self-pay

## 2019-11-11 DIAGNOSIS — F4310 Post-traumatic stress disorder, unspecified: Secondary | ICD-10-CM | POA: Insufficient documentation

## 2019-11-11 DIAGNOSIS — G894 Chronic pain syndrome: Secondary | ICD-10-CM | POA: Insufficient documentation

## 2019-11-11 DIAGNOSIS — Z5181 Encounter for therapeutic drug level monitoring: Secondary | ICD-10-CM | POA: Diagnosis present

## 2019-11-11 DIAGNOSIS — G8912 Acute post-thoracotomy pain: Secondary | ICD-10-CM | POA: Diagnosis not present

## 2019-11-11 DIAGNOSIS — F419 Anxiety disorder, unspecified: Secondary | ICD-10-CM | POA: Insufficient documentation

## 2019-11-11 DIAGNOSIS — Z79891 Long term (current) use of opiate analgesic: Secondary | ICD-10-CM | POA: Diagnosis present

## 2019-11-11 DIAGNOSIS — F329 Major depressive disorder, single episode, unspecified: Secondary | ICD-10-CM | POA: Diagnosis not present
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
## 2019-11-12 ENCOUNTER — Other Ambulatory Visit: Payer: Self-pay | Admitting: Physician Assistant

## 2019-11-12 DIAGNOSIS — F419 Anxiety disorder, unspecified: Secondary | ICD-10-CM

## 2019-11-12 DIAGNOSIS — F329 Major depressive disorder, single episode, unspecified: Secondary | ICD-10-CM

## 2019-11-19 ENCOUNTER — Encounter: Payer: Self-pay | Admitting: Physical Medicine and Rehabilitation

## 2019-11-19 ENCOUNTER — Telehealth: Payer: Self-pay

## 2019-11-19 ENCOUNTER — Encounter: Payer: Medicaid Other | Admitting: Physical Medicine and Rehabilitation

## 2019-11-19 ENCOUNTER — Other Ambulatory Visit: Payer: Self-pay

## 2019-11-19 VITALS — BP 135/89 | HR 90 | Temp 97.0°F | Ht 73.0 in | Wt 288.8 lb

## 2019-11-19 DIAGNOSIS — S225XXS Flail chest, sequela: Secondary | ICD-10-CM | POA: Diagnosis not present

## 2019-11-19 DIAGNOSIS — F329 Major depressive disorder, single episode, unspecified: Secondary | ICD-10-CM

## 2019-11-19 DIAGNOSIS — F419 Anxiety disorder, unspecified: Secondary | ICD-10-CM | POA: Diagnosis not present

## 2019-11-19 DIAGNOSIS — Z79891 Long term (current) use of opiate analgesic: Secondary | ICD-10-CM

## 2019-11-19 DIAGNOSIS — Z5181 Encounter for therapeutic drug level monitoring: Secondary | ICD-10-CM

## 2019-11-19 DIAGNOSIS — G8922 Chronic post-thoracotomy pain: Secondary | ICD-10-CM

## 2019-11-19 DIAGNOSIS — G8912 Acute post-thoracotomy pain: Secondary | ICD-10-CM

## 2019-11-19 DIAGNOSIS — G894 Chronic pain syndrome: Secondary | ICD-10-CM

## 2019-11-19 MED ORDER — DULOXETINE HCL 60 MG PO CPEP: 120.0000 mg | ORAL_CAPSULE | Freq: Two times a day (BID) | ORAL | 5 refills | Status: DC

## 2019-11-19 MED ORDER — BUPROPION HCL ER (XL) 150 MG PO TB24: 150.0000 mg | ORAL_TABLET | Freq: Every day | ORAL | 5 refills | Status: DC

## 2019-11-19 MED ORDER — LIDOCAINE 5 % EX PTCH: 3.0000 | MEDICATED_PATCH | CUTANEOUS | 5 refills | Status: DC

## 2019-11-19 MED ORDER — PRAZOSIN HCL 2 MG PO CAPS: 2.0000 mg | ORAL_CAPSULE | Freq: Every day | ORAL | 5 refills | Status: DC

## 2019-11-19 MED ORDER — DULOXETINE HCL 60 MG PO CPEP: 120.0000 mg | ORAL_CAPSULE | Freq: Every day | ORAL | 5 refills | Status: DC

## 2019-11-19 NOTE — Telephone Encounter (Signed)
Rx sent over for Duloxetine 60 mg take 2 capsules(120mg  total) by mouth 2 times daily. Is this correct? Pharmacy is clarifying.

## 2019-11-19 NOTE — Telephone Encounter (Signed)
Correct Rx sent to pharmacy 

## 2019-11-19 NOTE — Addendum Note (Signed)
Addended by: Jasmine December T on: 11/19/2019 10:30 AM   Modules accepted: Orders
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
## 2019-11-19 NOTE — Telephone Encounter (Signed)
Yes- a total of 120 mg- 2 60 mg capsules daily- to take at 1 time.
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
## 2019-11-23 LAB — TOXASSURE SELECT,+ANTIDEPR,UR

## 2019-11-24 ENCOUNTER — Telehealth: Payer: Self-pay

## 2019-11-24 ENCOUNTER — Telehealth: Payer: Self-pay | Admitting: Physical Medicine and Rehabilitation

## 2019-11-24 ENCOUNTER — Encounter: Payer: Medicaid Other | Admitting: Physical Medicine and Rehabilitation

## 2019-11-24 ENCOUNTER — Telehealth: Payer: Self-pay | Admitting: *Deleted

## 2019-11-24 MED ORDER — XTAMPZA ER 18 MG PO C12A: 18.0000 mg | EXTENDED_RELEASE_CAPSULE | Freq: Two times a day (BID) | ORAL | 0 refills | Status: DC

## 2019-11-24 MED ORDER — PROMETHAZINE HCL 25 MG PO TABS: 25.0000 mg | ORAL_TABLET | Freq: Four times a day (QID) | ORAL | 0 refills | Status: DC | PRN

## 2019-11-24 NOTE — Telephone Encounter (Signed)
  Has swelled more than normal in extremities has been swelling since last night, it's worse- in hands and feet.   Also associated N/V.   Memory and confusion is getting worse- and dizzy this AM. X 24 hours, been having acute sx's. Called PCP but hasn't heard back.    Didn't start "any of the new meds" we wrote for- but then admitted was taking something at night for sleep which has been helpful. .  Did start Prazosin- in general was helping him sleep- some until last 24 hours. For pain, the previous/outside physician Filled the same 10 mg TID of oxycodone-wasn't working for pain.    Has taken Zofran in past 8 mg for nausea, but had to take high doses for it to work- will send in Phenergan 25 mg q6 hours prn for nausea/vomiting- #50 with no RFs to help get past this GI bug vs withdrawal from pain meds? Not clear what's going on. Pt wasn't able to get to his appointment with me today because of dizziness and N/V. Rescheduled for next Friday.  Wrote new Rx for Xtampa 18 mg PO BID for pain- had been on Xtampa 36 mg BID in recent past, but has been at least a few weeks since last took it-since don't want pt in hospital with overdose, will prescribe 18 mg BID for now and can titrate up as required.   Doing Xtampa because less chance for overdose/ misuse,  And has used in past with good success. Will likely need to submit for a prior authorization since has been a little while since last had it and I'm a new physician.   Will see pt next Friday to see how things are going.

## 2019-11-24 NOTE — Telephone Encounter (Signed)
Patient had to cancel his appointment today with Dr. Dagoberto Ligas for trigger point, he was not feeling well, unstable to drive (feeling off balance).  I did get him rescheduled for 2/12 for trigger point injections. He would like to know results of drug screen and also if Dr. Dagoberto Ligas could prescribe his medications.  Please call patient.

## 2019-11-24 NOTE — Telephone Encounter (Signed)
Ptn called and states he is dizzy his hands and feet are swelling - could not attend appt today - wanted to know if UDS returned and what he should do with symptoms - [id number]

## 2019-11-24 NOTE — Telephone Encounter (Addendum)
Prior authorization submitted via [place] [employer]/Medicaid  For Xtampza 18 mg ER capsules #60 BID.  Approved 11/24/2019-11/18/2020

## 2019-11-24 NOTE — Telephone Encounter (Signed)
Prior authorization submitted to [place] for Lidoderm patches.  Approved 11/22/2019-11/16/2020

## 2019-11-24 NOTE — Telephone Encounter (Signed)
Medication consistent with UDS results

## 2019-11-29 ENCOUNTER — Telehealth: Payer: Self-pay

## 2019-11-29 NOTE — Telephone Encounter (Signed)
Patient called stating he needed to talk to Dr Dagoberto Ligas
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
## 2019-12-03 ENCOUNTER — Encounter: Payer: Self-pay | Admitting: Physical Medicine and Rehabilitation

## 2019-12-03 ENCOUNTER — Encounter: Payer: Medicaid Other | Attending: Psychology | Admitting: Physical Medicine and Rehabilitation

## 2019-12-03 ENCOUNTER — Other Ambulatory Visit: Payer: Self-pay

## 2019-12-03 DIAGNOSIS — M792 Neuralgia and neuritis, unspecified: Secondary | ICD-10-CM | POA: Diagnosis not present

## 2019-12-03 DIAGNOSIS — G8912 Acute post-thoracotomy pain: Secondary | ICD-10-CM | POA: Insufficient documentation

## 2019-12-03 DIAGNOSIS — G8922 Chronic post-thoracotomy pain: Secondary | ICD-10-CM | POA: Diagnosis not present

## 2019-12-03 DIAGNOSIS — Z5181 Encounter for therapeutic drug level monitoring: Secondary | ICD-10-CM | POA: Diagnosis present

## 2019-12-03 DIAGNOSIS — F419 Anxiety disorder, unspecified: Secondary | ICD-10-CM | POA: Diagnosis not present

## 2019-12-03 DIAGNOSIS — F329 Major depressive disorder, single episode, unspecified: Secondary | ICD-10-CM | POA: Diagnosis present

## 2019-12-03 DIAGNOSIS — G894 Chronic pain syndrome: Secondary | ICD-10-CM | POA: Diagnosis present

## 2019-12-03 DIAGNOSIS — F4310 Post-traumatic stress disorder, unspecified: Secondary | ICD-10-CM | POA: Diagnosis present

## 2019-12-03 DIAGNOSIS — Z79891 Long term (current) use of opiate analgesic: Secondary | ICD-10-CM | POA: Insufficient documentation

## 2019-12-03 MED ORDER — OXCARBAZEPINE 300 MG PO TABS: 300.0000 mg | ORAL_TABLET | Freq: Every day | ORAL | 3 refills | Status: DC

## 2019-12-03 NOTE — Progress Notes (Signed)
Is sleeping better on current regimen of sleep meds. Taking Xtampa 36 mg not the 18 mg prescribed- and will run out early.   Also taking ibuprofen and tylenol.  Was sent to ER 2x for pain. Got chest CT- Was told chest CT was negative for major problems.   Was told was on Antibiotic - don't see on meds list.    Exam: Awake, alert, appropriate, pressured speech due to pain, can't stay still and very stiff, no acute distress Mild swelling of hands and feet and ankles 1+ B/L   ROS: an entire ROS was completed and found to be negative- Is in so much pain, went to ER by ambulance x2.    Patient here for trigger point injections for chronic pain  Consent done and on chart.  Cleaned areas with alcohol and injected using a 27 gauge 1.5 inch needle  Injected 6cc Using 1% Lidocaine with no EPI  Upper traps B/L with major twitch on L Levators B/L Posterior scalenes B/L Middle scalenes Splenius Capitus  Pectoralis Major B/L Rhomboids B/L Infraspinatus on L Teres Major/minor B/L Thoracic paraspinals B/L Lumbar paraspinals Other injections-    Patient's level of pain prior was 8/10 Current level of pain after injections is 8/10- feels like still tight. Actually has improved ROM objectively, afterwards and muscles except L upper trap feel less tight. Feels the same 5 minutes later.     There was no bleeding or complications.  Patient was advised to drink a lot of water on day after injections to flush system Will have increased soreness for 12-48 hours after injections.  Can use Lidocaine patches the day AFTER injections Can use theracane on day of injections in places didn't inject Can use heating pad 4-6 hours AFTER injections.   2. Trileptal /Oxcarbemazipine 300 mg nightly for 4 days then 600 mg QHS x 4 days then 900 mg/3 tabs nightly- For nerve pain. - already tried multiple nerve pain meds without success-  - main side effects can cause sleepiness- and hyponatremia  3.  Wait to wean gabapentin until will see what's occurs with Trileptal - but think his swelling is due to Gabapentin, possibly.   4. Let me check on if I'm allowed to refill Xtampa early. Will check with clinic rules. Was planning to Increase Xtampa- 36 mg daily.    5. Will check BMP at next visit - due to chance of hyponatremia. Do same amount of salt as normal.  But it needs to be done 2.5 to 3 weeks AFTER start the medicine.    6. Discussed central sensitization as cause for pain- a new NIH research study.   7. F/U in 4 weeks  I spent a total of 50 minutes on appointment; more than 30 minutes on injections and educating on possibilities for nerve pain. And went over options.

## 2019-12-03 NOTE — Patient Instructions (Addendum)
  Patient here for trigger point injections for chronic pain  Consent done and on chart.  Cleaned areas with alcohol and injected using a 27 gauge 1.5 inch needle  Injected 6cc Using 1% Lidocaine with no EPI  Upper traps B/L with major twitch on L Levators B/L Posterior scalenes B/L Middle scalenes Splenius Capitus  Pectoralis Major B/L Rhomboids B/L Infraspinatus on L Teres Major/minor B/L Thoracic paraspinals B/L Lumbar paraspinals Other injections-    Patient's level of pain prior was 8/10 Current level of pain after injections is 8/10- feels like still tight. Actually has improved ROM objectively, afterwards and muscles except L upper trap feel less tight. Feels the same 5 minutes later.     There was no bleeding or complications.  Patient was advised to drink a lot of water on day after injections to flush system Will have increased soreness for 12-48 hours after injections.  Can use Lidocaine patches the day AFTER injections Can use theracane on day of injections in places didn't inject Can use heating pad 4-6 hours AFTER injections.   2. Trileptal /Oxcarbemazipine 300 mg nightly for 4 days then 600 mg QHS x 4 days then 900 mg/3 tabs nightly-already tried multiple nerve pain meds- no success.  For nerve pain.  - main side effects can cause sleepiness- and hyponatremia- low sodium  3. Wait to wean gabapentin until will see what's occurs with Trileptal - but think his swelling is due to Gabapentin, possibly.   4. Let me check on if I'm allowed to refill Xtampa early. Will check with clinic rules. Was planning to Increase Xtampa- 36 mg daily.    5. Will check BMP at next visit - due to chance of hyponatremia. Do same amount of salt as normal.  But it needs to be done 2.5 to 3 weeks AFTER start the medicine.    6. Discussed central sensitization as cause for pain- a new NIH research study.  7. F/U 4 weeks.

## 2019-12-09 ENCOUNTER — Inpatient Hospital Stay: Admission: RE | Admit: 2019-12-09 | Payer: Medicaid Other | Source: Ambulatory Visit

## 2019-12-10 ENCOUNTER — Telehealth: Payer: Self-pay

## 2019-12-10 ENCOUNTER — Other Ambulatory Visit: Payer: Self-pay

## 2019-12-10 MED ORDER — XTAMPZA ER 27 MG PO C12A: 27.0000 mg | EXTENDED_RELEASE_CAPSULE | Freq: Two times a day (BID) | ORAL | 0 refills | Status: DC

## 2019-12-10 NOTE — Telephone Encounter (Signed)
Back to 1 tab/BID. Xtampa- 18 mg BID  Trileptal - not been doing much better  Trazodone  Today was the first day back started to hurt- and shoulder and low back was helpful and neck helped as well.  Didn't inject a muscle to help the front abd pain.    Going to increase Xtampza to 27 mg BID- and refill 1x/early- but not again. Pain is still not controlled- so will increase Xtampza for now.   Have PCP to check sodium level. In 2 weeks.

## 2019-12-10 NOTE — Telephone Encounter (Signed)
[facility] called asking if strength was changed on the Xtampza and is it ok for them to fill 27 mg since he picked up 18 mg on 11/24/2019?

## 2019-12-10 NOTE — Telephone Encounter (Signed)
L/M with pt voicemail that will call back to discuss renewal of Xtampa- I know he's early- he and I discussed it at last visit.  Got approval from Dr. Letta Pate to do, ONCE, will d/w pt prior to renewing. Will call back later today.

## 2019-12-10 NOTE — Telephone Encounter (Signed)
Patient called requesting a refill of his xtampza medication.  Next follow up appointment is on 12-31-19 with Dr Dagoberto Ligas.  According to PMP his last refill of this medication was on 11-24-2019 for 60 caps.

## 2019-12-11 ENCOUNTER — Other Ambulatory Visit: Payer: Self-pay | Admitting: Physician Assistant

## 2019-12-11 DIAGNOSIS — F329 Major depressive disorder, single episode, unspecified: Secondary | ICD-10-CM

## 2019-12-11 DIAGNOSIS — F32A Depression, unspecified: Secondary | ICD-10-CM

## 2019-12-13 NOTE — Telephone Encounter (Signed)
Yes- That's why I changed it. Please have them fill it.

## 2019-12-13 NOTE — Telephone Encounter (Signed)
Prior authorization submitted via  Tracks for xtampza 27 mg.  Approved  Effective Begin Date: 12/13/2019 Effective End Date: 12/07/2020  Patient notified  Pharmacy notified Permission to fill Xtampza 27 mg early given per Dr. Dagoberto Ligas

## 2019-12-31 ENCOUNTER — Encounter: Payer: Medicaid Other | Attending: Psychology | Admitting: Physical Medicine and Rehabilitation

## 2019-12-31 DIAGNOSIS — F419 Anxiety disorder, unspecified: Secondary | ICD-10-CM | POA: Insufficient documentation

## 2019-12-31 DIAGNOSIS — F329 Major depressive disorder, single episode, unspecified: Secondary | ICD-10-CM | POA: Insufficient documentation

## 2019-12-31 DIAGNOSIS — G8912 Acute post-thoracotomy pain: Secondary | ICD-10-CM | POA: Insufficient documentation

## 2019-12-31 DIAGNOSIS — Z79891 Long term (current) use of opiate analgesic: Secondary | ICD-10-CM | POA: Insufficient documentation

## 2019-12-31 DIAGNOSIS — Z5181 Encounter for therapeutic drug level monitoring: Secondary | ICD-10-CM | POA: Insufficient documentation

## 2019-12-31 DIAGNOSIS — F4310 Post-traumatic stress disorder, unspecified: Secondary | ICD-10-CM | POA: Insufficient documentation

## 2019-12-31 DIAGNOSIS — G894 Chronic pain syndrome: Secondary | ICD-10-CM | POA: Insufficient documentation

## 2020-01-05 ENCOUNTER — Ambulatory Visit: Payer: Medicaid Other | Admitting: Psychology

## 2020-01-07 ENCOUNTER — Telehealth: Payer: Self-pay

## 2020-01-07 MED ORDER — XTAMPZA ER 27 MG PO C12A: 27.0000 mg | EXTENDED_RELEASE_CAPSULE | Freq: Two times a day (BID) | ORAL | 0 refills | Status: DC

## 2020-01-07 NOTE — Telephone Encounter (Signed)
Refilled xtampza 27 mg BID- will not refill again until seen patient- was asking for refills "of 3 meds" I assume he means Cymbalta and Gabapentin- there are refills at pharmacy for these 2 meds.

## 2020-01-07 NOTE — Telephone Encounter (Signed)
Per Dr. Dagoberto Ligas - must present in person for next refill ---- she will fill the RX today only. LVM ON MACHINE

## 2020-01-07 NOTE — Telephone Encounter (Signed)
Sent a Mychart message as well reminding patient no more refills without an appointment and being present.

## 2020-01-07 NOTE — Telephone Encounter (Signed)
ptn called regarding 3 refills on his meds his number is [phone]  (message left [id number]

## 2020-01-07 NOTE — Telephone Encounter (Signed)
Left message letting patient know that Dr. Dagoberto Ligas will refill his medications for one more month but will need to make an appt to get any further refills per Dr. Dagoberto Ligas.

## 2020-01-10 ENCOUNTER — Other Ambulatory Visit: Payer: Self-pay | Admitting: Physician Assistant

## 2020-01-17 ENCOUNTER — Ambulatory Visit: Payer: Medicaid Other | Admitting: Psychology

## 2020-01-31 ENCOUNTER — Ambulatory Visit: Payer: Medicaid Other | Admitting: Psychology

## 2020-01-31 ENCOUNTER — Encounter: Payer: Self-pay | Admitting: Physical Medicine and Rehabilitation

## 2020-01-31 ENCOUNTER — Other Ambulatory Visit: Payer: Self-pay

## 2020-01-31 ENCOUNTER — Encounter: Payer: Medicaid Other | Attending: Psychology | Admitting: Physical Medicine and Rehabilitation

## 2020-01-31 VITALS — BP 133/91 | HR 84 | Temp 98.7°F | Ht 73.0 in | Wt 292.2 lb

## 2020-01-31 DIAGNOSIS — Z5181 Encounter for therapeutic drug level monitoring: Secondary | ICD-10-CM | POA: Diagnosis not present

## 2020-01-31 DIAGNOSIS — G8922 Chronic post-thoracotomy pain: Secondary | ICD-10-CM

## 2020-01-31 DIAGNOSIS — F419 Anxiety disorder, unspecified: Secondary | ICD-10-CM | POA: Diagnosis present

## 2020-01-31 DIAGNOSIS — G8912 Acute post-thoracotomy pain: Secondary | ICD-10-CM | POA: Insufficient documentation

## 2020-01-31 DIAGNOSIS — F4310 Post-traumatic stress disorder, unspecified: Secondary | ICD-10-CM | POA: Diagnosis present

## 2020-01-31 DIAGNOSIS — F329 Major depressive disorder, single episode, unspecified: Secondary | ICD-10-CM | POA: Diagnosis present

## 2020-01-31 DIAGNOSIS — G894 Chronic pain syndrome: Secondary | ICD-10-CM

## 2020-01-31 DIAGNOSIS — M792 Neuralgia and neuritis, unspecified: Secondary | ICD-10-CM

## 2020-01-31 DIAGNOSIS — Z79891 Long term (current) use of opiate analgesic: Secondary | ICD-10-CM | POA: Diagnosis not present

## 2020-01-31 MED ORDER — HYDROXYZINE HCL 25 MG PO TABS: 25.0000 mg | ORAL_TABLET | Freq: Four times a day (QID) | ORAL | 5 refills | Status: DC | PRN

## 2020-01-31 MED ORDER — XTAMPZA ER 36 MG PO C12A: 36.0000 mg | EXTENDED_RELEASE_CAPSULE | Freq: Two times a day (BID) | ORAL | 0 refills | Status: DC

## 2020-01-31 MED ORDER — OMEPRAZOLE 20 MG PO CPDR: 20.0000 mg | DELAYED_RELEASE_CAPSULE | Freq: Every day | ORAL | 5 refills | Status: DC

## 2020-01-31 MED ORDER — PRAZOSIN HCL 2 MG PO CAPS: 4.0000 mg | ORAL_CAPSULE | Freq: Every day | ORAL | 5 refills | Status: DC

## 2020-01-31 NOTE — Patient Instructions (Signed)
1. Describing GERD/reflux. Omprazole- 20 mg- sent an Rx- daily-   2. Patient here for trigger point injections for myofascial pain/chronic pain  Consent done and on chart.  Cleaned areas with alcohol and injected using a 27 gauge 1.5 inch needle  Injected 3cc Using 1% Lidocaine with no EPI  Upper traps  Levators B/L Posterior scalenes B/L Middle scalenes B/L Splenius Capitus Pectoralis Major Rhomboids B/L and L side InfraspinatusL only Teres Major/minor- L only Thoracic paraspinals Lumbar paraspinals Other injections- L under L arm- next to scar   Patient's level of pain prior was- 9/10 Current level of pain after injections is- still a 9/10- head feels more dizzy/lightheaded  There was no bleeding or complications.  Patient was advised to drink a lot of water on day after injections to flush system Will have increased soreness for 12-48 hours after injections.  Can use Lidocaine patches the day AFTER injections Can use theracane on day of injections in places didn't inject Can use heating pad 4-6 hours AFTER injections  3. Increase Prazosin- to 40 mg nightly- for sleep- can affect BP- make it lower, so take 1/2 of current BP medicine. Breaking Losartan   4.  Don't break Metoprolol Succinate in half.   5.  Out of Hydroxyzine- will Rx. 25-50 mg q6 hours as needed- if takes higher dose, will make drowsy.   6. Wean Oxcarbemazine- take 3 tabs nightly x 4 days then 2 tabs nightly x 4 days then 1 tab nightly x 4 day then stop.  If, during that time, pain increases, then call me and we can titrate it back up.   7. Will increase Xtampaza to 36 mg BID- cannot go higher.   8. Suggest getting an attorney for disability.   9. Happy to fill disability forms.   10. F/U 6 weeks.

## 2020-01-31 NOTE — Progress Notes (Signed)
Subjective:    Patient ID: [name], male    DOB: 26-May-1976, 44 y.o.   MRN: [id number]  HPI  Pain Inventory Average Pain 8 Pain Right Now 8 My pain is sharp, burning, dull, stabbing, tingling and aching  In the last 24 hours, has pain interfered with the following? General activity 9 Relation with others 7 Enjoyment of life 9 What TIME of day is your pain at its worst? all Sleep (in general) Poor  Pain is worse with: walking, bending, sitting, standing and some activites Pain improves with: medication and injections Relief from Meds: 6  Mobility how many minutes can you walk? 15-20 ability to climb steps?  yes do you drive?  yes  Function employed # of hrs/week .  Neuro/Psych weakness numbness tingling trouble walking confusion depression anxiety loss of taste or smell  Prior Studies Any changes since last visit?  no  Physicians involved in your care Any changes since last visit?  no   Family History  Problem Relation Age of Onset  . Cirrhosis Father   . Colitis Father   . Heart disease Father   . Asthma Father   . Other Father        Chemical Imbalance  . Heart attack Other        Paternal Grandparents  . Stroke Other        Paternal Grandparents  . Prostate cancer Paternal Grandfather   . Diabetes Maternal Grandfather   . Alcohol abuse Mother   . Other Brother        Intestinal Fissure  . Colon polyps Sister        intestinal problems  . Asthma Son   . Other Brother        Chemical Imbalance   Social History   Socioeconomic History  . Marital status: Divorced    Spouse name: Not on file  . Number of children: 3  . Years of education: Not on file  . Highest education level: Not on file  Occupational History  . Not on file  Tobacco Use  . Smoking status: Current Every Day Smoker    Packs/day: 1.00    Years: 24.00    Pack years: 24.00    Types: Cigarettes  . Smokeless tobacco: Never Used  Substance and Sexual Activity  .  Alcohol use: Yes    Alcohol/week: 0.0 standard drinks    Comment: occ  . Drug use: No  . Sexual activity: Yes  Other Topics Concern  . Not on file  Social History Narrative   ** Merged History Encounter **       Social Determinants of Health   Financial Resource Strain:   . Difficulty of Paying Living Expenses:   Food Insecurity:   . Worried About Charity fundraiser in the Last Year:   . Arboriculturist in the Last Year:   Transportation Needs:   . Film/video editor (Medical):   Marland Kitchen Lack of Transportation (Non-Medical):   Physical Activity:   . Days of Exercise per Week:   . Minutes of Exercise per Session:   Stress:   . Feeling of Stress :   Social Connections:   . Frequency of Communication with Friends and Family:   . Frequency of Social Gatherings with Friends and Family:   . Attends Religious Services:   . Active Member of Clubs or Organizations:   . Attends Archivist Meetings:   Marland Kitchen Marital Status:  Past Surgical History:  Procedure Laterality Date  . COLONOSCOPY WITH PROPOFOL N/A 08/19/2013   Procedure: COLONOSCOPY WITH PROPOFOL;  Surgeon: Milus Banister, MD;  Location: [facility];  Service: Endoscopy;  Laterality: N/A;  . ESOPHAGOGASTRODUODENOSCOPY (EGD) WITH PROPOFOL N/A 08/19/2013   Procedure: ESOPHAGOGASTRODUODENOSCOPY (EGD) WITH PROPOFOL;  Surgeon: Milus Banister, MD;  Location: [facility];  Service: Endoscopy;  Laterality: N/A;  . IR RADIOLOGIST EVAL & MGMT  05/20/2019  . ORIF MANDIBULAR FRACTURE N/A 02/16/2016   Procedure: OPEN REDUCTION INTERNAL FIXATION (ORIF) MANDIBULAR FRACTURE;  Surgeon: Melissa Montane, MD;  Location: [facility];  Service: ENT;  Laterality: N/A;  . RIB PLATING Left 02/20/2016   Procedure: LEFT RIB PLATING;  Surgeon: Ivin Poot, MD;  Location: [facility];  Service: Thoracic;  Laterality: Left;  . ROBOTIC ASSITED PARTIAL NEPHRECTOMY Left 06/17/2013   Procedure: ROBOTIC ASSITED PARTIAL NEPHRECTOMY;  Surgeon: Dutch Gray, MD;   Location: [facility];  Service: Urology;  Laterality: Left;  . TRACHEOSTOMY TUBE PLACEMENT N/A 02/16/2016   Procedure: TRACHEOSTOMY;  Surgeon: Melissa Montane, MD;  Location: [facility];  Service: ENT;  Laterality: N/A;  . VASECTOMY  2012  . WISDOM TOOTH EXTRACTION  middle school  . WRIST SURGERY Left middle school   "arteries and nerves tangled up"   Past Medical History:  Diagnosis Date  . Anxiety and depression 08/01/2013  . Cancer ([place])   . Cellulitis    left leg and stomach  . Chicken pox   . Chronic pain   . Diarrhea 08/01/2013  . History of kidney cancer   . Hx of vasectomy   . Hypertension   . Renal cell carcinoma ([place]) 06/01/2013   BP (!) 133/91   Pulse 84   Temp 98.7 F (37.1 C)   Ht 6\' 1"  (1.854 m)   Wt 292 lb 3.2 oz (132.5 kg)   SpO2 95%   BMI 38.55 kg/m   Opioid Risk Score:   Fall Risk Score:  `1  Depression screen PHQ 2/9  Depression screen [facility] 2/9 11/19/2019 07/19/2019 04/05/2019  Decreased Interest 2 2 0  Down, Depressed, Hopeless 2 2 1   PHQ - 2 Score 4 4 1   Altered sleeping 2 2 0  Tired, decreased energy 3 2 1   Change in appetite 2 1 0  Feeling bad or failure about yourself  2 2 0  Trouble concentrating 2 2 0  Moving slowly or fidgety/restless 2 2 0  Suicidal thoughts 0 0 0  PHQ-9 Score 17 15 2   Difficult doing work/chores Very difficult Very difficult Somewhat difficult  Some encounter information is confidential and restricted. Go to Review Flowsheets activity to see all data.  Some recent data might be hidden    Review of Systems     Objective:   Physical Exam        Assessment & Plan:

## 2020-01-31 NOTE — Progress Notes (Signed)
  Patient is a 44 yr old male with hx of chronic pain syndrome   Dizziness and confusion getting worse.  Throwing up in mouth.   Taking a lot of Tylenol 3 tabs- so  975 mg- 2-3x/day Ibuprofen- 3 200 mg so 600 mg- 2-3x/day.    Breaking BP med in half. Hasn't caused any change to BP.  Not taking Gabapentin as prescribed- 450 mg in AM and 600 mg QHS.  Mood better on Bupropion- 150 mg XL for mood.   Not sleeping well anymore- nightmares are not going away.  Pain worse lately because trying to get more active.   Having Has- no sense of appetite- spinning and headaches.   Now having sharp pain in hands, wrists, and ankles. Bad.     Plan: 1. Describing GERD/reflux. Omprazole- 20 mg- sent an Rx- daily-   2. Patient here for trigger point injections for myofascial pain/chronic pain  Consent done and on chart.  Cleaned areas with alcohol and injected using a 27 gauge 1.5 inch needle  Injected 3cc Using 1% Lidocaine with no EPI  Upper traps  Levators B/L Posterior scalenes B/L Middle scalenes B/L Splenius Capitus Pectoralis Major Rhomboids B/L and L side InfraspinatusL only Teres Major/minor- L only Thoracic paraspinals Lumbar paraspinals Other injections- L under L arm- next to scar   Patient's level of pain prior was- 9/10 Current level of pain after injections is- still a 9/10- head feels more dizzy/lightheaded  There was no bleeding or complications.  Patient was advised to drink a lot of water on day after injections to flush system Will have increased soreness for 12-48 hours after injections.  Can use Lidocaine patches the day AFTER injections Can use theracane on day of injections in places didn't inject Can use heating pad 4-6 hours AFTER injections  3. Increase Prazosin- to 40 mg nightly- for sleep- can affect BP- make it lower, so take 1/2 of current BP medicine. Breaking Losartan   4.  Don't break Metoprolol Succinate in half.   5.  Out of  Hydroxyzine- will Rx. 25-50 mg q6 hours as needed- if takes higher dose, will make drowsy.   6. Wean Oxcarbemazine- take 3 tabs nightly x 4 days then 2 tabs nightly x 4 days then 1 tab nightly x 4 day then stop.  If, during that time, pain increases, then call me and we can titrate it back up.   7. Will increase Xtampaza to 36 mg BID- cannot go higher.   8. Suggest getting an attorney for disability.   9. Happy to fill disability forms.   10. F/U 6 weeks.

## 2020-02-04 LAB — DRUG TOX MONITOR 1 W/CONF, ORAL FLD
Amphetamines: NEGATIVE ng/mL (ref <10)
Barbiturates: NEGATIVE ng/mL (ref <10)
Benzodiazepines: NEGATIVE ng/mL (ref <0.50)
Buprenorphine: NEGATIVE ng/mL (ref <0.10)
Cocaine: NEGATIVE ng/mL (ref <5.0)
Codeine: NEGATIVE ng/mL (ref <2.5)
Cotinine: >250 ng/mL — ABNORMAL HIGH (ref <5.0)
Dihydrocodeine: NEGATIVE ng/mL (ref <2.5)
Fentanyl: NEGATIVE ng/mL (ref <0.10)
Heroin Metabolite: NEGATIVE ng/mL (ref <1.0)
Hydrocodone: NEGATIVE ng/mL (ref <2.5)
Hydromorphone: NEGATIVE ng/mL (ref <2.5)
MARIJUANA: NEGATIVE ng/mL (ref <2.5)
MDMA: NEGATIVE ng/mL (ref <10)
Meprobamate: NEGATIVE ng/mL (ref <2.5)
Methadone: NEGATIVE ng/mL (ref <5.0)
Morphine: NEGATIVE ng/mL (ref <2.5)
Nicotine Metabolite: POSITIVE ng/mL — AB (ref <5.0)
Norhydrocodone: NEGATIVE ng/mL (ref <2.5)
Noroxycodone: 158.7 ng/mL — ABNORMAL HIGH (ref <2.5)
Opiates: POSITIVE ng/mL — AB (ref <2.5)
Oxycodone: >250 ng/mL — ABNORMAL HIGH (ref <2.5)
Oxymorphone: NEGATIVE ng/mL (ref <2.5)
Phencyclidine: NEGATIVE ng/mL (ref <10)
Tapentadol: NEGATIVE ng/mL (ref <5.0)
Tramadol: NEGATIVE ng/mL (ref <5.0)
Zolpidem: NEGATIVE ng/mL (ref <5.0)

## 2020-02-04 LAB — DRUG TOX ALC METAB W/CON, ORAL FLD: Alcohol Metabolite: NEGATIVE ng/mL (ref <25)

## 2020-02-07 ENCOUNTER — Telehealth: Payer: Self-pay | Admitting: *Deleted

## 2020-02-07 NOTE — Telephone Encounter (Signed)
Oral swab drug screen was consistent for prescribed medications.  ?

## 2020-02-29 ENCOUNTER — Telehealth: Payer: Self-pay | Admitting: *Deleted

## 2020-02-29 MED ORDER — XTAMPZA ER 36 MG PO C12A: 36.0000 mg | EXTENDED_RELEASE_CAPSULE | Freq: Two times a day (BID) | ORAL | 0 refills | Status: DC

## 2020-02-29 NOTE — Telephone Encounter (Signed)
Refilled his Xtampza 36 mg BID #60 on 02/29/20- of note, may has 31 days.

## 2020-02-29 NOTE — Telephone Encounter (Signed)
I cannot inject straight into the hip, but if the pain is lateral aspect of the hip/I.e trochanteric bursitis, I'm happy to do so.  Please just make sure he tells staff when comes in, so clinical staff can get syringe/injection ready- thanks, ML

## 2020-02-29 NOTE — Telephone Encounter (Signed)
Mr Widman called and is requesting a refill on his Xtampza 36 mg. Per PMP his last fill was 01/31/20. HIs next appt is 03/17/20.

## 2020-02-29 NOTE — Telephone Encounter (Signed)
Notified patient that script was sent in  Patient added that he wanted me to ask Dr. Dagoberto Ligas if she would be willing to do a hip injection along with trigger points during visit upcoming 05/28

## 2020-03-01 NOTE — Telephone Encounter (Signed)
Left voicemail informing trochanteric bursitis injection can be done

## 2020-03-02 ENCOUNTER — Other Ambulatory Visit: Payer: Self-pay | Admitting: Physical Medicine and Rehabilitation

## 2020-03-17 ENCOUNTER — Encounter: Payer: Medicaid Other | Attending: Psychology | Admitting: Physical Medicine and Rehabilitation

## 2020-03-17 ENCOUNTER — Other Ambulatory Visit: Payer: Self-pay

## 2020-03-17 ENCOUNTER — Encounter: Payer: Self-pay | Admitting: Physical Medicine and Rehabilitation

## 2020-03-17 VITALS — BP 128/80 | HR 84 | Temp 97.5°F | Ht 73.0 in | Wt 300.0 lb

## 2020-03-17 DIAGNOSIS — N50812 Left testicular pain: Secondary | ICD-10-CM | POA: Insufficient documentation

## 2020-03-17 DIAGNOSIS — Z5181 Encounter for therapeutic drug level monitoring: Secondary | ICD-10-CM | POA: Insufficient documentation

## 2020-03-17 DIAGNOSIS — F329 Major depressive disorder, single episode, unspecified: Secondary | ICD-10-CM | POA: Insufficient documentation

## 2020-03-17 DIAGNOSIS — R5382 Chronic fatigue, unspecified: Secondary | ICD-10-CM

## 2020-03-17 DIAGNOSIS — M62838 Other muscle spasm: Secondary | ICD-10-CM | POA: Insufficient documentation

## 2020-03-17 DIAGNOSIS — G8912 Acute post-thoracotomy pain: Secondary | ICD-10-CM | POA: Insufficient documentation

## 2020-03-17 DIAGNOSIS — M7062 Trochanteric bursitis, left hip: Secondary | ICD-10-CM

## 2020-03-17 DIAGNOSIS — F4310 Post-traumatic stress disorder, unspecified: Secondary | ICD-10-CM | POA: Insufficient documentation

## 2020-03-17 DIAGNOSIS — M546 Pain in thoracic spine: Secondary | ICD-10-CM | POA: Diagnosis not present

## 2020-03-17 DIAGNOSIS — G894 Chronic pain syndrome: Secondary | ICD-10-CM | POA: Diagnosis present

## 2020-03-17 DIAGNOSIS — Z79891 Long term (current) use of opiate analgesic: Secondary | ICD-10-CM | POA: Insufficient documentation

## 2020-03-17 DIAGNOSIS — M792 Neuralgia and neuritis, unspecified: Secondary | ICD-10-CM | POA: Diagnosis not present

## 2020-03-17 DIAGNOSIS — F419 Anxiety disorder, unspecified: Secondary | ICD-10-CM | POA: Diagnosis present

## 2020-03-17 DIAGNOSIS — G8922 Chronic post-thoracotomy pain: Secondary | ICD-10-CM

## 2020-03-17 MED ORDER — METAXALONE 800 MG PO TABS: 800.0000 mg | ORAL_TABLET | Freq: Three times a day (TID) | ORAL | 5 refills | Status: DC

## 2020-03-17 MED ORDER — XTAMPZA ER 36 MG PO C12A: 36.0000 mg | EXTENDED_RELEASE_CAPSULE | Freq: Two times a day (BID) | ORAL | 0 refills | Status: DC

## 2020-03-17 NOTE — Patient Instructions (Signed)
  1. Urology referral to Dr Johnney Ou  (Dr Pia Mau) for testicular pain/swelling   2. steroid injection was performed at L trochanteric bursitis using 1% plain Lidocaine and 40mg  /1cc of Kenalog. This was well tolerated.  Cleaned with betadine x3 and allowed to dry- then alcohol then injected using 27 gauge 1.5 inch needle- no bleeding or complications.    F/U in 3 months for steroid injections of L trochanteric bursa  Lidocaine will kick in 15 minutes- and wear off tonight- the steroid will kick in tomorrow within 24 hours and take up to 72 hours to fully kick in.  3. Went back to using Trileptal since was worse when tried to wean off it.    4. Skelaxin 800 mg 3x/day- take 4 days/week  For muscle relaxation- has tried Baclofen, Flexeril and Robaxin- very sedating and not so helpful.   5. When check labs, check TSH as well as BMP since having such bad muscle spasms, low thyroid can be associated with muscle spasms that don't respond long term to treatment.  6. BMP since on Trileptal need to check Sodium  7. Patient here for trigger point injections for  Consent done and on chart.  Cleaned areas with alcohol and injected using a 27 gauge 1.5 inch needle  Injected 6cc  Using 1% Lidocaine with no EPI  Upper traps B/L Levators B/L Posterior scalenes B/L Middle scalenes Splenius Capitus B/L Pectoralis Major B/L Rhomboids B/L Infraspinatus Teres Major/minor Thoracic paraspinals B/L x2 and L teres x3 Lumbar paraspinals Other injections-    Patient's level of pain prior was 8/10 Current level of pain after injections is 8/10- about the same- takes awhile to work  There was no bleeding or complications.  Patient was advised to drink a lot of water on day after injections to flush system Will have increased soreness for 12-48 hours after injections.  Can use Lidocaine patches the day AFTER injections Can use theracane on day of injections in places didn't inject Can use  heating pad 4-6 hours AFTER injections  8. Drinks 1.5 gallons/day- slow down to 3/4 gallon/day total   F/u in 4 weeks- Biotene mouthwash, gum and rinse

## 2020-03-17 NOTE — Progress Notes (Signed)
Subjective:    Patient ID: [name], male    DOB: 08/25/1976, 44 y.o.   MRN: [id number]  HPI   Wants trigger point injections And trochanteric bursa injections.   Testicles are swelling- sent to Urology- pain in testicles.   Dr Marjory Lies- was seen- didn't help pt at all.      Pain Inventory Average Pain 8 Pain Right Now 8 My pain is sharp, burning, dull and aching  In the last 24 hours, has pain interfered with the following? General activity 8 Relation with others 7 Enjoyment of life 10 What TIME of day is your pain at its worst? evening Sleep (in general) Poor  Pain is worse with: unsure Pain improves with: medication Relief from Meds: 5  Mobility ability to climb steps?  yes do you drive?  yes  Function not employed: date last employed .  Neuro/Psych weakness numbness tingling spasms dizziness confusion depression anxiety  Prior Studies Any changes since last visit?  no  Physicians involved in your care Any changes since last visit?  no   Family History  Problem Relation Age of Onset  . Cirrhosis Father   . Colitis Father   . Heart disease Father   . Asthma Father   . Other Father        Chemical Imbalance  . Heart attack Other        Paternal Grandparents  . Stroke Other        Paternal Grandparents  . Prostate cancer Paternal Grandfather   . Diabetes Maternal Grandfather   . Alcohol abuse Mother   . Other Brother        Intestinal Fissure  . Colon polyps Sister        intestinal problems  . Asthma Son   . Other Brother        Chemical Imbalance   Social History   Socioeconomic History  . Marital status: Divorced    Spouse name: Not on file  . Number of children: 3  . Years of education: Not on file  . Highest education level: Not on file  Occupational History  . Not on file  Tobacco Use  . Smoking status: Current Every Day Smoker    Packs/day: 1.00    Years: 24.00    Pack years: 24.00    Types: Cigarettes  .  Smokeless tobacco: Never Used  Substance and Sexual Activity  . Alcohol use: Yes    Alcohol/week: 0.0 standard drinks    Comment: occ  . Drug use: No  . Sexual activity: Yes  Other Topics Concern  . Not on file  Social History Narrative   ** Merged History Encounter **       Social Determinants of Health   Financial Resource Strain:   . Difficulty of Paying Living Expenses:   Food Insecurity:   . Worried About Charity fundraiser in the Last Year:   . Arboriculturist in the Last Year:   Transportation Needs:   . Film/video editor (Medical):   Marland Kitchen Lack of Transportation (Non-Medical):   Physical Activity:   . Days of Exercise per Week:   . Minutes of Exercise per Session:   Stress:   . Feeling of Stress :   Social Connections:   . Frequency of Communication with Friends and Family:   . Frequency of Social Gatherings with Friends and Family:   . Attends Religious Services:   . Active Member of Clubs or Organizations:   .  Attends Archivist Meetings:   Marland Kitchen Marital Status:    Past Surgical History:  Procedure Laterality Date  . COLONOSCOPY WITH PROPOFOL N/A 08/19/2013   Procedure: COLONOSCOPY WITH PROPOFOL;  Surgeon: Milus Banister, MD;  Location: [facility];  Service: Endoscopy;  Laterality: N/A;  . ESOPHAGOGASTRODUODENOSCOPY (EGD) WITH PROPOFOL N/A 08/19/2013   Procedure: ESOPHAGOGASTRODUODENOSCOPY (EGD) WITH PROPOFOL;  Surgeon: Milus Banister, MD;  Location: [facility];  Service: Endoscopy;  Laterality: N/A;  . IR RADIOLOGIST EVAL & MGMT  05/20/2019  . ORIF MANDIBULAR FRACTURE N/A 02/16/2016   Procedure: OPEN REDUCTION INTERNAL FIXATION (ORIF) MANDIBULAR FRACTURE;  Surgeon: Melissa Montane, MD;  Location: [place];  Service: ENT;  Laterality: N/A;  . RIB PLATING Left 02/20/2016   Procedure: LEFT RIB PLATING;  Surgeon: Ivin Poot, MD;  Location: [place];  Service: Thoracic;  Laterality: Left;  . ROBOTIC ASSITED PARTIAL NEPHRECTOMY Left 06/17/2013   Procedure:  ROBOTIC ASSITED PARTIAL NEPHRECTOMY;  Surgeon: Dutch Gray, MD;  Location: [facility];  Service: Urology;  Laterality: Left;  . TRACHEOSTOMY TUBE PLACEMENT N/A 02/16/2016   Procedure: TRACHEOSTOMY;  Surgeon: Melissa Montane, MD;  Location: [place];  Service: ENT;  Laterality: N/A;  . VASECTOMY  2012  . WISDOM TOOTH EXTRACTION  middle school  . WRIST SURGERY Left middle school   "arteries and nerves tangled up"   Past Medical History:  Diagnosis Date  . Anxiety and depression 08/01/2013  . Cancer ([place])   . Cellulitis    left leg and stomach  . Chicken pox   . Chronic pain   . Diarrhea 08/01/2013  . History of kidney cancer   . Hx of vasectomy   . Hypertension   . Renal cell carcinoma ([place]) 06/01/2013   BP 128/80   Pulse 84   Temp (!) 97.5 F (36.4 C)   Ht 6\' 1"  (1.854 m)   Wt 300 lb (136.1 kg)   SpO2 95%   BMI 39.58 kg/m   Opioid Risk Score:   Fall Risk Score:  `1  Depression screen PHQ 2/9  Depression screen [facility] 2/9 11/19/2019 07/19/2019 04/05/2019  Decreased Interest 2 2 0  Down, Depressed, Hopeless 2 2 1   PHQ - 2 Score 4 4 1   Altered sleeping 2 2 0  Tired, decreased energy 3 2 1   Change in appetite 2 1 0  Feeling bad or failure about yourself  2 2 0  Trouble concentrating 2 2 0  Moving slowly or fidgety/restless 2 2 0  Suicidal thoughts 0 0 0  PHQ-9 Score 17 15 2   Difficult doing work/chores Very difficult Very difficult Somewhat difficult  Some encounter information is confidential and restricted. Go to Review Flowsheets activity to see all data.  Some recent data might be hidden    Review of Systems  Constitutional: Negative.   HENT: Negative.   Eyes: Negative.   Respiratory: Negative.   Cardiovascular: Negative.   Gastrointestinal: Negative.   Endocrine: Negative.   Genitourinary: Positive for testicular pain.  Musculoskeletal: Positive for arthralgias and back pain.  Skin: Negative.   Neurological: Positive for weakness and numbness.       Tingling     Hematological: Negative.   Psychiatric/Behavioral: Positive for confusion and dysphoric mood. The patient is nervous/anxious.   All other systems reviewed and are negative.      Objective:   Physical Exam Awake, alert, c/o L hip and L thoracic pain, NAD Trigger points also in upper neck/back as well  L  trochanteric bursitis on exam as well       Assessment & Plan:    1. Urology referral to Dr Johnney Ou  (Dr Pia Mau) for testicular pain/swelling   2. steroid injection was performed at L trochanteric bursitis using 1% plain Lidocaine and 40mg  /1cc of Kenalog. This was well tolerated.  Cleaned with betadine x3 and allowed to dry- then alcohol then injected using 27 gauge 1.5 inch needle- no bleeding or complications.    F/U in 3 months for steroid injections of L trochanteric bursa  Lidocaine will kick in 15 minutes- and wear off tonight- the steroid will kick in tomorrow within 24 hours and take up to 72 hours to fully kick in.  3. Went back to using Trileptal since was worse when tried to wean off it.    4. Skelaxin 800 mg 3x/day- take 4 days/week  For muscle relaxation- has tried Baclofen, Flexeril and Robaxin- very sedating and not so helpful.   5. When check labs, check TSH as well as BMP since having such bad muscle spasms, low thyroid can be associated with muscle spasms that don't respond long term to treatment.  6. BMP since on Trileptal need to check Sodium  7. Patient here for trigger point injections for  Consent done and on chart.  Cleaned areas with alcohol and injected using a 27 gauge 1.5 inch needle  Injected 6cc  Using 1% Lidocaine with no EPI  Upper traps B/L Levators B/L Posterior scalenes B/L Middle scalenes Splenius Capitus B/L Pectoralis Major B/L Rhomboids B/L Infraspinatus Teres Major/minor Thoracic paraspinals B/L x2 and L teres x3 Lumbar paraspinals Other injections-    Patient's level of pain prior was 8/10 Current level of  pain after injections is 8/10- about the same- takes awhile to work  There was no bleeding or complications.  Patient was advised to drink a lot of water on day after injections to flush system Will have increased soreness for 12-48 hours after injections.  Can use Lidocaine patches the day AFTER injections Can use theracane on day of injections in places didn't inject Can use heating pad 4-6 hours AFTER injections  8. Drinks 1.5 gallons/day- slow down to 3/4 gallon/day total   F/u in 4 weeks- Biotene mouthwash, gum and rinse   I spent a total of 40 minutes- detailed above.

## 2020-03-18 LAB — BASIC METABOLIC PANEL
BUN/Creatinine Ratio: 16 (ref 9–20)
BUN: 14 mg/dL (ref 6–24)
CO2: 25 mmol/L (ref 20–29)
Calcium: 9.4 mg/dL (ref 8.7–10.2)
Chloride: 96 mmol/L (ref 96–106)
Creatinine, Ser: 0.88 mg/dL (ref 0.76–1.27)
GFR calc Af Amer: 120 mL/min/{1.73_m2} (ref >59)
GFR calc non Af Amer: 104 mL/min/{1.73_m2} (ref >59)
Glucose: 115 mg/dL — ABNORMAL HIGH (ref 65–99)
Potassium: 5.2 mmol/L (ref 3.5–5.2)
Sodium: 134 mmol/L (ref 134–144)

## 2020-03-18 LAB — TSH: TSH: 0.817 u[IU]/mL (ref 0.450–4.500)

## 2020-03-24 ENCOUNTER — Telehealth: Payer: Self-pay | Admitting: Physical Medicine and Rehabilitation

## 2020-03-24 NOTE — Telephone Encounter (Signed)
Pt's TSH looks good and his sodium is good on Trileptal- so he can continue that medicine for his pain control/nerve pain- I'm happy with results.   ML

## 2020-03-27 ENCOUNTER — Encounter: Payer: Self-pay | Admitting: *Deleted

## 2020-03-28 ENCOUNTER — Telehealth: Payer: Self-pay | Admitting: Physical Medicine and Rehabilitation

## 2020-03-28 NOTE — Telephone Encounter (Signed)
Patient is calling stating his kidney pain is 3 times worse than last week. He also stated his dizziness is worse. He is wanting advice on what to do.

## 2020-03-29 ENCOUNTER — Other Ambulatory Visit: Payer: Self-pay

## 2020-03-29 ENCOUNTER — Encounter (HOSPITAL_BASED_OUTPATIENT_CLINIC_OR_DEPARTMENT_OTHER): Payer: Self-pay

## 2020-03-29 DIAGNOSIS — R1032 Left lower quadrant pain: Secondary | ICD-10-CM | POA: Insufficient documentation

## 2020-03-29 DIAGNOSIS — Z85528 Personal history of other malignant neoplasm of kidney: Secondary | ICD-10-CM | POA: Diagnosis not present

## 2020-03-29 DIAGNOSIS — F419 Anxiety disorder, unspecified: Secondary | ICD-10-CM | POA: Insufficient documentation

## 2020-03-29 DIAGNOSIS — F17210 Nicotine dependence, cigarettes, uncomplicated: Secondary | ICD-10-CM | POA: Diagnosis not present

## 2020-03-29 DIAGNOSIS — G8929 Other chronic pain: Secondary | ICD-10-CM | POA: Insufficient documentation

## 2020-03-29 LAB — COMPREHENSIVE METABOLIC PANEL
ALT: 19 U/L (ref 0–44)
AST: 18 U/L (ref 15–41)
Albumin: 4 g/dL (ref 3.5–5.0)
Alkaline Phosphatase: 66 U/L (ref 38–126)
Anion gap: 8 (ref 5–15)
BUN: 16 mg/dL (ref 6–20)
CO2: 25 mmol/L (ref 22–32)
Calcium: 8.9 mg/dL (ref 8.9–10.3)
Chloride: 100 mmol/L (ref 98–111)
Creatinine, Ser: 1.01 mg/dL (ref 0.61–1.24)
GFR calc Af Amer: >60 mL/min (ref >60)
GFR calc non Af Amer: >60 mL/min (ref >60)
Glucose, Bld: 115 mg/dL — ABNORMAL HIGH (ref 70–99)
Potassium: 4.4 mmol/L (ref 3.5–5.1)
Sodium: 133 mmol/L — ABNORMAL LOW (ref 135–145)
Total Bilirubin: 0.2 mg/dL — ABNORMAL LOW (ref 0.3–1.2)
Total Protein: 6.9 g/dL (ref 6.5–8.1)

## 2020-03-29 LAB — CBC
HCT: 45.1 % (ref 39.0–52.0)
Hemoglobin: 15.3 g/dL (ref 13.0–17.0)
MCH: 30.8 pg (ref 26.0–34.0)
MCHC: 33.9 g/dL (ref 30.0–36.0)
MCV: 90.9 fL (ref 80.0–100.0)
Platelets: 248 10*3/uL (ref 150–400)
RBC: 4.96 MIL/uL (ref 4.22–5.81)
RDW: 12.8 % (ref 11.5–15.5)
WBC: 6.9 10*3/uL (ref 4.0–10.5)
nRBC: 0 % (ref 0.0–0.2)

## 2020-03-29 LAB — URINALYSIS, ROUTINE W REFLEX MICROSCOPIC
Bilirubin Urine: NEGATIVE
Glucose, UA: NEGATIVE mg/dL
Hgb urine dipstick: NEGATIVE
Ketones, ur: NEGATIVE mg/dL
Leukocytes,Ua: NEGATIVE
Nitrite: NEGATIVE
Protein, ur: NEGATIVE mg/dL
Specific Gravity, Urine: 1.02 (ref 1.005–1.030)
pH: 7 (ref 5.0–8.0)

## 2020-03-29 LAB — LIPASE, BLOOD: Lipase: 23 U/L (ref 11–51)

## 2020-03-29 MED ORDER — SODIUM CHLORIDE 0.9% FLUSH
3.0000 mL | Freq: Once | INTRAVENOUS | Status: DC
  Filled 2020-03-29: qty 3

## 2020-03-29 NOTE — ED Triage Notes (Addendum)
Pt c/o left side abd pain, flank pain, testicle pain/swelling x 2 weeks-states he was seen by PCP and urology-NAD-steady gait

## 2020-03-29 NOTE — Telephone Encounter (Signed)
Pt is emphatic pain is "kidney pain"- /flank pain- and so painful- just keeps getting worse.   Asked me to call his PCP- Heide Scales NP and see if can be seen, treated for issues.   I called and spoke to [place]- an NP covering for [facility]-  They will try to get him in this afternoon- they will call pt immediately to let him know.  So I will not call pt back, since PCP will call him ASAP, they said.

## 2020-03-30 ENCOUNTER — Encounter (HOSPITAL_BASED_OUTPATIENT_CLINIC_OR_DEPARTMENT_OTHER): Payer: Self-pay | Admitting: Emergency Medicine

## 2020-03-30 ENCOUNTER — Emergency Department (HOSPITAL_BASED_OUTPATIENT_CLINIC_OR_DEPARTMENT_OTHER)
Admission: EM | Admit: 2020-03-30 | Discharge: 2020-03-30 | Disposition: A | Discharge status: Home / Self Care | Payer: Medicaid Other | Attending: Emergency Medicine | Admitting: Emergency Medicine

## 2020-03-30 ENCOUNTER — Emergency Department (HOSPITAL_BASED_OUTPATIENT_CLINIC_OR_DEPARTMENT_OTHER): Payer: Medicaid Other

## 2020-03-30 ENCOUNTER — Other Ambulatory Visit: Payer: Self-pay | Admitting: Physical Medicine and Rehabilitation

## 2020-03-30 DIAGNOSIS — R109 Unspecified abdominal pain: Secondary | ICD-10-CM

## 2020-03-30 MED ORDER — HALOPERIDOL LACTATE 5 MG/ML IJ SOLN: 2.0000 mg | Freq: Once | INTRAMUSCULAR | Status: AC

## 2020-03-30 MED ORDER — HALOPERIDOL LACTATE 5 MG/ML IJ SOLN
INTRAMUSCULAR | Status: AC
  Administered 2020-03-30: 2 mg via INTRAVENOUS
  Filled 2020-03-30: qty 1

## 2020-03-30 MED ORDER — ONDANSETRON HCL 4 MG/2ML IJ SOLN
INTRAMUSCULAR | Status: AC
  Filled 2020-03-30: qty 2

## 2020-03-30 MED ORDER — ONDANSETRON HCL 4 MG/2ML IJ SOLN: 4.0000 mg | Freq: Once | INTRAMUSCULAR | Status: DC

## 2020-03-30 MED ORDER — KETOROLAC TROMETHAMINE 30 MG/ML IJ SOLN
INTRAMUSCULAR | Status: AC
  Administered 2020-03-30: 30 mg via INTRAVENOUS
  Filled 2020-03-30: qty 1

## 2020-03-30 MED ORDER — KETOROLAC TROMETHAMINE 30 MG/ML IJ SOLN: 30.0000 mg | Freq: Once | INTRAMUSCULAR | Status: AC

## 2020-03-30 MED ORDER — LIDOCAINE 5 % EX PTCH: 1.0000 | MEDICATED_PATCH | CUTANEOUS | 0 refills | Status: DC
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
## 2020-03-30 NOTE — ED Notes (Signed)
Assisted MD with testicle exam.

## 2020-04-10 ENCOUNTER — Other Ambulatory Visit: Payer: Self-pay

## 2020-04-10 ENCOUNTER — Encounter: Payer: Self-pay | Admitting: Physical Medicine and Rehabilitation

## 2020-04-10 ENCOUNTER — Encounter: Payer: Medicaid Other | Attending: Psychology | Admitting: Physical Medicine and Rehabilitation

## 2020-04-10 VITALS — BP 135/91 | HR 112 | Temp 97.5°F | Ht 74.0 in | Wt 296.0 lb

## 2020-04-10 DIAGNOSIS — Z5181 Encounter for therapeutic drug level monitoring: Secondary | ICD-10-CM | POA: Insufficient documentation

## 2020-04-10 DIAGNOSIS — F329 Major depressive disorder, single episode, unspecified: Secondary | ICD-10-CM | POA: Diagnosis present

## 2020-04-10 DIAGNOSIS — F4310 Post-traumatic stress disorder, unspecified: Secondary | ICD-10-CM | POA: Diagnosis present

## 2020-04-10 DIAGNOSIS — F419 Anxiety disorder, unspecified: Secondary | ICD-10-CM | POA: Insufficient documentation

## 2020-04-10 DIAGNOSIS — G8912 Acute post-thoracotomy pain: Secondary | ICD-10-CM | POA: Diagnosis present

## 2020-04-10 DIAGNOSIS — M62838 Other muscle spasm: Secondary | ICD-10-CM | POA: Diagnosis present

## 2020-04-10 DIAGNOSIS — G894 Chronic pain syndrome: Secondary | ICD-10-CM

## 2020-04-10 DIAGNOSIS — Z79891 Long term (current) use of opiate analgesic: Secondary | ICD-10-CM | POA: Insufficient documentation

## 2020-04-10 DIAGNOSIS — N50812 Left testicular pain: Secondary | ICD-10-CM | POA: Insufficient documentation

## 2020-04-10 DIAGNOSIS — M25562 Pain in left knee: Secondary | ICD-10-CM | POA: Diagnosis not present

## 2020-04-10 MED ORDER — HYDROXYZINE PAMOATE 50 MG PO CAPS
50.0000 mg | ORAL_CAPSULE | Freq: Three times a day (TID) | ORAL | 3 refills | Status: DC | PRN
Start: 2020-04-10 — End: 2020-05-03

## 2020-04-10 NOTE — Progress Notes (Signed)
Patient is a 44 yr old male with hx of chronic pain syndrome here for f/u.    Testicular pain is worse- went to pee- When pees, pain is really painful in "kidneys'.   Going to see Psychiatrist/Psychology 6/29.  Has to see Psychology first.   Saw specialist for vertigo- given meds for dizziness- has been helpful.   Likes skelaxin - helps a lot.  For muscle spasms/muscle relaxant.   Has to skip days, like I said-  Also has lidocaine patches- helping some as well.   Already had radiofrequency ablation, and thoracic block.  Didn't work at all.   Thinks L knee "was an ACL tear"- hurts so much- feel like it's unstable- wants referral to Ortho    Plan: 1. Trochanteric bursa injection in 2 months- done last month  2. Con't current meds- skelaxin, Tripetal, pain meds; etc.    3. Patient here for trigger point injections for chronic pain  Consent done and on chart.  Cleaned areas with alcohol and injected using a 27 gauge 1.5 inch needle  Injected 6cc Using 1% Lidocaine with no EPI  Upper traps B/L Levators B/L Posterior scalenes B/L Middle scalenes Splenius Capitus Pectoralis Major B/L Rhomboids B/L x2- and 2 more on L around scapula and under L arm Infraspinatus B/L  Teres Major/minor Thoracic paraspinals Lumbar paraspinals Other injections-    Patient's level of pain prior was- 8/10 Current level of pain after injections is 8/10- about the same- usually takes til next day to feel better  There was no bleeding or complications.  Patient was advised to drink a lot of water on day after injections to flush system Will have increased soreness for 12-48 hours after injections.  Can use Lidocaine patches the day AFTER injections Can use theracane on day of injections in places didn't inject Can use heating pad 4-6 hours AFTER injections   3. Referral to local Urology- might go faster.    4. Wants referral to Ortho for L knee- insistent doesn't just want  injections  5. Increase Hydroxyzine to 50 mg 3x/day as needed- needs to address with new psychology/psychiatry.   6. Is getting Valium/diazepam 2 mg 3x/day as needed- prn- from other doctor- NO DRINKING with these meds.    7. Ask front desk to help with PCP-/doctor.    8. F/U in 4 weeks or so.    I spent a total of 50 minutes on visit- as detailed above.

## 2020-04-10 NOTE — Patient Instructions (Signed)
Plan: 1. Trochanteric bursa injection in 2 months- done last month  2. Con't current meds- skelaxin, Tripetal, pain meds; etc.    3. Patient here for trigger point injections for chronic pain  Consent done and on chart.  Cleaned areas with alcohol and injected using a 27 gauge 1.5 inch needle  Injected 6cc Using 1% Lidocaine with no EPI  Upper traps B/L Levators B/L Posterior scalenes B/L Middle scalenes Splenius Capitus Pectoralis Major B/L Rhomboids B/L x2- and 2 more on L around scapula and under L arm Infraspinatus B/L  Teres Major/minor Thoracic paraspinals Lumbar paraspinals Other injections-    Patient's level of pain prior was- 8/10 Current level of pain after injections is 8/10- about the same- usually takes til next day to feel better  There was no bleeding or complications.  Patient was advised to drink a lot of water on day after injections to flush system Will have increased soreness for 12-48 hours after injections.  Can use Lidocaine patches the day AFTER injections Can use theracane on day of injections in places didn't inject Can use heating pad 4-6 hours AFTER injections   3. Referral to local Urology- might go faster.    4. Wants referral to Ortho for L knee- insistent doesn't just want injections  5. Increase Hydroxyzine to 50 mg 3x/day as needed  6. Is getting Valium/diazepam 2 mg 3x/day as needed- prn- from other doctor- NO DRINKING with these meds.    7. Ask front desk to help with PCP-/doctor.    8. F/U in 4 weeks or so.

## 2020-04-11 ENCOUNTER — Ambulatory Visit (INDEPENDENT_AMBULATORY_CARE_PROVIDER_SITE_OTHER): Payer: Medicaid Other

## 2020-04-11 ENCOUNTER — Ambulatory Visit: Payer: Self-pay

## 2020-04-11 ENCOUNTER — Other Ambulatory Visit: Payer: Self-pay | Admitting: Physical Medicine and Rehabilitation

## 2020-04-11 ENCOUNTER — Ambulatory Visit (INDEPENDENT_AMBULATORY_CARE_PROVIDER_SITE_OTHER): Payer: Medicaid Other | Admitting: Orthopedic Surgery

## 2020-04-11 DIAGNOSIS — M79605 Pain in left leg: Secondary | ICD-10-CM

## 2020-04-11 DIAGNOSIS — M79604 Pain in right leg: Secondary | ICD-10-CM

## 2020-04-11 DIAGNOSIS — M25562 Pain in left knee: Secondary | ICD-10-CM

## 2020-04-11 DIAGNOSIS — R1032 Left lower quadrant pain: Secondary | ICD-10-CM

## 2020-04-11 DIAGNOSIS — M25561 Pain in right knee: Secondary | ICD-10-CM | POA: Diagnosis not present

## 2020-04-11 DIAGNOSIS — G8929 Other chronic pain: Secondary | ICD-10-CM

## 2020-04-14 LAB — DRUG TOX MONITOR 1 W/CONF, ORAL FLD
Alprazolam: NEGATIVE ng/mL (ref <0.50)
Amphetamines: NEGATIVE ng/mL (ref <10)
Barbiturates: NEGATIVE ng/mL (ref <10)
Benzodiazepines: POSITIVE ng/mL — AB (ref <0.50)
Buprenorphine: NEGATIVE ng/mL (ref <0.10)
Chlordiazepoxide: NEGATIVE ng/mL (ref <0.50)
Clonazepam: NEGATIVE ng/mL (ref <0.50)
Cocaine: NEGATIVE ng/mL (ref <5.0)
Codeine: NEGATIVE ng/mL (ref <2.5)
Cotinine: 62.8 ng/mL — ABNORMAL HIGH (ref <5.0)
Diazepam: 0.66 ng/mL — ABNORMAL HIGH (ref <0.50)
Dihydrocodeine: NEGATIVE ng/mL (ref <2.5)
Fentanyl: NEGATIVE ng/mL (ref <0.10)
Flunitrazepam: NEGATIVE ng/mL (ref <0.50)
Flurazepam: NEGATIVE ng/mL (ref <0.50)
Heroin Metabolite: NEGATIVE ng/mL (ref <1.0)
Hydrocodone: NEGATIVE ng/mL (ref <2.5)
Hydromorphone: NEGATIVE ng/mL (ref <2.5)
Lorazepam: NEGATIVE ng/mL (ref <0.50)
MARIJUANA: NEGATIVE ng/mL (ref <2.5)
MDMA: NEGATIVE ng/mL (ref <10)
Meprobamate: NEGATIVE ng/mL (ref <2.5)
Methadone: NEGATIVE ng/mL (ref <5.0)
Midazolam: NEGATIVE ng/mL (ref <0.50)
Morphine: NEGATIVE ng/mL (ref <2.5)
Nicotine Metabolite: POSITIVE ng/mL — AB (ref <5.0)
Nordiazepam: 1 ng/mL — ABNORMAL HIGH (ref <0.50)
Norhydrocodone: NEGATIVE ng/mL (ref <2.5)
Noroxycodone: 45.3 ng/mL — ABNORMAL HIGH (ref <2.5)
Opiates: POSITIVE ng/mL — AB (ref <2.5)
Oxazepam: NEGATIVE ng/mL (ref <0.50)
Oxycodone: >250 ng/mL — ABNORMAL HIGH (ref <2.5)
Oxymorphone: NEGATIVE ng/mL (ref <2.5)
Phencyclidine: NEGATIVE ng/mL (ref <10)
Tapentadol: NEGATIVE ng/mL (ref <5.0)
Temazepam: NEGATIVE ng/mL (ref <0.50)
Tramadol: NEGATIVE ng/mL (ref <5.0)
Triazolam: NEGATIVE ng/mL (ref <0.50)
Zolpidem: NEGATIVE ng/mL (ref <5.0)

## 2020-04-14 LAB — DRUG TOX ALC METAB W/CON, ORAL FLD: Alcohol Metabolite: NEGATIVE ng/mL (ref <25)

## 2020-04-15 ENCOUNTER — Encounter: Payer: Self-pay | Admitting: Orthopedic Surgery
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
## 2020-04-17 ENCOUNTER — Telehealth: Payer: Self-pay | Admitting: *Deleted

## 2020-04-17 ENCOUNTER — Telehealth: Payer: Self-pay

## 2020-04-17 NOTE — Telephone Encounter (Signed)
Patient called asking if you can write a letter stating patient is disabled so he can give it to a church that is going to help him with his bills and also to lawyer that is helping him with disability claim. He will pick it up. Patient understands that this message will not be addressed until tomorrow.

## 2020-04-17 NOTE — Telephone Encounter (Signed)
Oral swab drug screen was consistent for prescribed medications.  ?

## 2020-04-26 ENCOUNTER — Other Ambulatory Visit: Payer: Self-pay | Admitting: Physical Medicine and Rehabilitation

## 2020-04-26 ENCOUNTER — Telehealth: Payer: Self-pay

## 2020-04-26 MED ORDER — XTAMPZA ER 36 MG PO C12A: 36.0000 mg | EXTENDED_RELEASE_CAPSULE | Freq: Two times a day (BID) | ORAL | 0 refills | Status: DC

## 2020-04-26 NOTE — Telephone Encounter (Signed)
Patient called requesting refill for Xtampza. LF 03/29/2020. Prescription on file is expired. Next appt 05/20/2020. Dr. Florentina Jenny patient. [facility]

## 2020-04-27 ENCOUNTER — Other Ambulatory Visit: Payer: Self-pay | Admitting: Physical Medicine and Rehabilitation

## 2020-05-03 ENCOUNTER — Other Ambulatory Visit: Payer: Self-pay | Admitting: Physical Medicine and Rehabilitation

## 2020-05-03 ENCOUNTER — Other Ambulatory Visit: Payer: Self-pay

## 2020-05-03 ENCOUNTER — Telehealth: Payer: Self-pay

## 2020-05-03 MED ORDER — XTAMPZA ER 36 MG PO C12A: 36.0000 mg | EXTENDED_RELEASE_CAPSULE | Freq: Two times a day (BID) | ORAL | 0 refills | Status: DC

## 2020-05-03 NOTE — Telephone Encounter (Signed)
Sent!

## 2020-05-03 NOTE — Telephone Encounter (Signed)
Please review & prescribe. Dr. Dagoberto Ligas is out of the office.   Will you please send in [name] one month supply of Rx Xtampza ER?  The last time he only got a 7 day supply.

## 2020-05-15 ENCOUNTER — Other Ambulatory Visit: Payer: Self-pay

## 2020-05-15 ENCOUNTER — Ambulatory Visit
Admission: RE | Admit: 2020-05-15 | Discharge: 2020-05-15 | Disposition: A | Discharge status: Home / Self Care | Payer: Medicaid Other | Source: Ambulatory Visit | Attending: Orthopedic Surgery | Admitting: Orthopedic Surgery

## 2020-05-15 DIAGNOSIS — M79604 Pain in right leg: Secondary | ICD-10-CM

## 2020-05-15 DIAGNOSIS — G8929 Other chronic pain: Secondary | ICD-10-CM

## 2020-05-19 ENCOUNTER — Encounter: Payer: Medicaid Other | Admitting: Physical Medicine and Rehabilitation

## 2020-05-22 ENCOUNTER — Other Ambulatory Visit: Payer: Self-pay | Admitting: Orthopedic Surgery

## 2020-05-22 ENCOUNTER — Telehealth: Payer: Self-pay | Admitting: Orthopedic Surgery

## 2020-05-22 NOTE — Telephone Encounter (Signed)
See below. Please call with results.

## 2020-05-22 NOTE — Telephone Encounter (Signed)
Patient called requesting Dr. Marlou Sa to call and  read MRI results  over the phone. Patient Phone number is [phone].

## 2020-05-24 ENCOUNTER — Telehealth: Payer: Self-pay | Admitting: Orthopedic Surgery

## 2020-05-24 NOTE — Telephone Encounter (Signed)
Patient returned call to Dr Marlou Sa asked for a call back to discuss message from Dr Marlou Sa. The number to contact patient is [phone]

## 2020-05-24 NOTE — Telephone Encounter (Signed)
I called left message on machine.  Has posterior horn medial meniscal tear with not much in terms of displaced fragments but he does have a cyst going towards the PCL.  Surgery may or may not be indicated depending on his symptoms

## 2020-05-24 NOTE — Telephone Encounter (Signed)
See below

## 2020-05-25 ENCOUNTER — Telehealth: Payer: Self-pay | Admitting: Orthopedic Surgery

## 2020-05-25 NOTE — Telephone Encounter (Signed)
See below. Please advise. Thanks.  

## 2020-05-25 NOTE — Telephone Encounter (Signed)
[name] states that he was returning Dr. Randel Pigg call regarding his knee/ torn meniscus and cyst.  He would like to go ahead and schedule the surgery that Dr. Marlou Sa mentioned if possible.  You can reach patient at [phone].

## 2020-05-26 NOTE — Telephone Encounter (Signed)
I called and left message

## 2020-05-26 NOTE — Telephone Encounter (Signed)
I called and left message on machine again

## 2020-05-30 ENCOUNTER — Other Ambulatory Visit: Payer: Self-pay

## 2020-05-30 MED ORDER — XTAMPZA ER 36 MG PO C12A: 36.0000 mg | EXTENDED_RELEASE_CAPSULE | Freq: Two times a day (BID) | ORAL | 0 refills | Status: DC

## 2020-05-31 ENCOUNTER — Other Ambulatory Visit: Payer: Self-pay

## 2020-05-31 ENCOUNTER — Encounter: Payer: Self-pay | Admitting: Physical Medicine and Rehabilitation

## 2020-05-31 ENCOUNTER — Encounter: Payer: Medicaid Other | Attending: Psychology | Admitting: Physical Medicine and Rehabilitation

## 2020-05-31 VITALS — BP 142/91 | HR 95 | Temp 98.7°F | Ht 74.0 in | Wt 300.0 lb

## 2020-05-31 DIAGNOSIS — F329 Major depressive disorder, single episode, unspecified: Secondary | ICD-10-CM | POA: Diagnosis present

## 2020-05-31 DIAGNOSIS — G894 Chronic pain syndrome: Secondary | ICD-10-CM | POA: Insufficient documentation

## 2020-05-31 DIAGNOSIS — Z79891 Long term (current) use of opiate analgesic: Secondary | ICD-10-CM | POA: Insufficient documentation

## 2020-05-31 DIAGNOSIS — M25562 Pain in left knee: Secondary | ICD-10-CM | POA: Diagnosis present

## 2020-05-31 DIAGNOSIS — M792 Neuralgia and neuritis, unspecified: Secondary | ICD-10-CM | POA: Diagnosis not present

## 2020-05-31 DIAGNOSIS — Z5181 Encounter for therapeutic drug level monitoring: Secondary | ICD-10-CM | POA: Diagnosis present

## 2020-05-31 DIAGNOSIS — N50812 Left testicular pain: Secondary | ICD-10-CM | POA: Diagnosis present

## 2020-05-31 DIAGNOSIS — G8912 Acute post-thoracotomy pain: Secondary | ICD-10-CM | POA: Insufficient documentation

## 2020-05-31 DIAGNOSIS — M62838 Other muscle spasm: Secondary | ICD-10-CM | POA: Diagnosis present

## 2020-05-31 DIAGNOSIS — F4310 Post-traumatic stress disorder, unspecified: Secondary | ICD-10-CM | POA: Insufficient documentation

## 2020-05-31 DIAGNOSIS — G8922 Chronic post-thoracotomy pain: Secondary | ICD-10-CM

## 2020-05-31 DIAGNOSIS — F419 Anxiety disorder, unspecified: Secondary | ICD-10-CM | POA: Insufficient documentation

## 2020-05-31 NOTE — Patient Instructions (Signed)
Plan: 1. Patient here for trigger point injections for  Consent done and on chart.  Cleaned areas with alcohol and injected using a 27 gauge 1.5 inch needle  Injected 6cc Using 1% Lidocaine with no EPI  Upper traps R only Levators Posterior scalenes BL Middle scalenes Splenius Capitus Pectoralis Major Rhomboids B/L x2 Infraspinatus Teres Major/minor L only Thoracic paraspinals B/L Lumbar paraspinals B/L x2 Other injections- L thoracotomy site- around it- x4   Patient's level of pain prior was 10/10 Current level of pain after injections is  There was no bleeding (needed 1 bandaid- taking NSAIDs) or complications.  Patient was advised to drink a lot of water on day after injections to flush system Will have increased soreness for 12-48 hours after injections.  Can use Lidocaine patches the day AFTER injections Can use heating pad 4-6 hours AFTER injections  2. Insurance won't cover pain meds- refilled them, but current insurance won't work with his situation.   3. Want to see if can try Nucynta- through insurance- the generic. Will need Prior Authorization.  Other option methadone-    4. F/U in 1 month Needs L knee surgery for torn meniscus and PCL

## 2020-05-31 NOTE — Progress Notes (Signed)
Patient is a 44 yr old male with hx of chronic pain syndrome here for f/u.    Still hurting- even when has Xtampza Hurting in places hasn't hurt before.   Low back is all the way across.  Across low back and neck above shoulders.   Also R anterior ribs are also hurting.   Always hurting in R thoracotomy site and epigastric area.   Is still taking gabapentin 600 mg TID Doesn't take Baclofen anymore.  Still on Duloxetine Not on Trilpetal    Plan: 1. Patient here for trigger point injections for  Consent done and on chart.  Cleaned areas with alcohol and injected using a 27 gauge 1.5 inch needle  Injected 6cc Using 1% Lidocaine with no EPI  Upper traps R only Levators Posterior scalenes BL Middle scalenes Splenius Capitus Pectoralis Major Rhomboids B/L x2 Infraspinatus Teres Major/minor L only Thoracic paraspinals B/L Lumbar paraspinals B/L x2 Other injections- L thoracotomy site- around it- x4   Patient's level of pain prior was 10/10 Current level of pain after injections is  There was no bleeding (needed 1 bandaid- taking NSAIDs) or complications.  Patient was advised to drink a lot of water on day after injections to flush system Will have increased soreness for 12-48 hours after injections.  Can use Lidocaine patches the day AFTER injections Can use heating pad 4-6 hours AFTER injections  2. Insurance won't cover pain meds- refilled them, but current insurance won't work with his situation.   3. Want to see if can try Nucynta- through insurance- the generic. Will need Prior Authorization.  Other option methadone-    4. F/U in 1 month Needs L knee surgery for torn meniscus and PCL  I spent a total of 30 minutes on visit- spent a lot of time discussing pain regimen and what to do- esp with insurance change.

## 2020-06-05 ENCOUNTER — Telehealth: Payer: Self-pay

## 2020-06-05 ENCOUNTER — Other Ambulatory Visit: Payer: Self-pay

## 2020-06-05 NOTE — Telephone Encounter (Signed)
So, since he got his meds for 2 weeks, will wait for Yvone Neu to get back- thanks- ML

## 2020-06-05 NOTE — Telephone Encounter (Signed)
Patient called back retuning call from Dr Marlou Sa

## 2020-06-05 NOTE — Telephone Encounter (Signed)
See below. Patient requesting a call back from you again. You have called patient twice and left message each time.

## 2020-06-05 NOTE — Telephone Encounter (Signed)
[name] called x2 at 9:38 and 9:50 states he chganged from [place] to [facility] - [facility] needs a prior auth sent for his meds - has not had in 7 days ("in DT's)" he said he has spoken to [facility] medication line and they just need prior auth for pain meds - if you need to call

## 2020-06-06 NOTE — Telephone Encounter (Signed)
Rx problem fixed. Patient has medication.

## 2020-06-14 ENCOUNTER — Telehealth: Payer: Self-pay | Admitting: Orthopedic Surgery

## 2020-06-14 NOTE — Telephone Encounter (Signed)
Patient called.   He said that he and Dr.Dean have been missing each other's calls repeatedly. He says he is ready to proceed with the surgery though   Call back: [phone]

## 2020-06-15 NOTE — Telephone Encounter (Signed)
IC patient advised Dr Marlou Sa out of office until next wee but once he returns will have him complete blue sheet to give to [facility].

## 2020-06-20 NOTE — Telephone Encounter (Signed)
I left voicemail for patient advising surgery sheet has been completed and the next call he should expect will come from [place] in regards to scheduling.

## 2020-06-20 NOTE — Telephone Encounter (Signed)
Blue sheet done thx

## 2020-06-30 ENCOUNTER — Encounter: Payer: Medicaid Other | Admitting: Physical Medicine and Rehabilitation

## 2020-07-03 ENCOUNTER — Telehealth: Payer: Self-pay

## 2020-07-03 MED ORDER — GABAPENTIN 300 MG PO CAPS
ORAL_CAPSULE | ORAL | 6 refills | Status: DC
Start: 2020-07-03 — End: 2020-12-29

## 2020-07-03 MED ORDER — XTAMPZA ER 36 MG PO C12A: 36.0000 mg | EXTENDED_RELEASE_CAPSULE | Freq: Two times a day (BID) | ORAL | 0 refills | Status: DC

## 2020-07-03 NOTE — Telephone Encounter (Signed)
Refill request for Xtampza and Gabapentin [facility], [place]  [employer] Member [id number] Identication # [id number] Effective date 06/21/2020

## 2020-07-03 NOTE — Telephone Encounter (Signed)
Sent in Rx's- will likely need preauthorizaitons done, FYI.  THanks

## 2020-07-06 ENCOUNTER — Encounter: Payer: Self-pay | Admitting: *Deleted

## 2020-07-06 NOTE — Telephone Encounter (Signed)
Prior Auth submitted to [place] for [employer] ER 36 mg #60.  Approval received 07/05/2020-10/03/2020. Faxed to pharmacy and MR Jani notified by myChart email due to phone still not working.[name]

## 2020-07-10 ENCOUNTER — Other Ambulatory Visit: Payer: Self-pay

## 2020-07-10 ENCOUNTER — Other Ambulatory Visit (HOSPITAL_COMMUNITY): Payer: Medicaid Other

## 2020-07-10 ENCOUNTER — Encounter (HOSPITAL_BASED_OUTPATIENT_CLINIC_OR_DEPARTMENT_OTHER): Payer: Self-pay | Admitting: Orthopedic Surgery

## 2020-07-11 ENCOUNTER — Other Ambulatory Visit (HOSPITAL_COMMUNITY)
Admission: RE | Admit: 2020-07-11 | Discharge: 2020-07-11 | Disposition: A | Discharge status: Home / Self Care | Payer: Medicaid Other | Source: Ambulatory Visit

## 2020-07-11 ENCOUNTER — Encounter (HOSPITAL_BASED_OUTPATIENT_CLINIC_OR_DEPARTMENT_OTHER)
Admission: RE | Admit: 2020-07-11 | Discharge: 2020-07-11 | Disposition: A | Discharge status: Home / Self Care | Payer: Medicaid Other | Source: Ambulatory Visit | Attending: Orthopedic Surgery | Admitting: Orthopedic Surgery

## 2020-07-11 DIAGNOSIS — Z20822 Contact with and (suspected) exposure to covid-19: Secondary | ICD-10-CM | POA: Insufficient documentation

## 2020-07-11 DIAGNOSIS — Z01812 Encounter for preprocedural laboratory examination: Secondary | ICD-10-CM | POA: Insufficient documentation

## 2020-07-11 LAB — BASIC METABOLIC PANEL
Anion gap: 10 (ref 5–15)
BUN: 6 mg/dL (ref 6–20)
CO2: 25 mmol/L (ref 22–32)
Calcium: 9.3 mg/dL (ref 8.9–10.3)
Chloride: 97 mmol/L — ABNORMAL LOW (ref 98–111)
Creatinine, Ser: 0.92 mg/dL (ref 0.61–1.24)
GFR calc Af Amer: >60 mL/min (ref >60)
GFR calc non Af Amer: >60 mL/min (ref >60)
Glucose, Bld: 123 mg/dL — ABNORMAL HIGH (ref 70–99)
Potassium: 4.3 mmol/L (ref 3.5–5.1)
Sodium: 132 mmol/L — ABNORMAL LOW (ref 135–145)

## 2020-07-11 LAB — CBC
HCT: 44.4 % (ref 39.0–52.0)
Hemoglobin: 15.3 g/dL (ref 13.0–17.0)
MCH: 29.7 pg (ref 26.0–34.0)
MCHC: 34.5 g/dL (ref 30.0–36.0)
MCV: 86.2 fL (ref 80.0–100.0)
Platelets: 251 10*3/uL (ref 150–400)
RBC: 5.15 MIL/uL (ref 4.22–5.81)
RDW: 12.3 % (ref 11.5–15.5)
WBC: 7.6 10*3/uL (ref 4.0–10.5)
nRBC: 0 % (ref 0.0–0.2)

## 2020-07-11 LAB — SARS CORONAVIRUS 2 (TAT 6-24 HRS): SARS Coronavirus 2: NEGATIVE

## 2020-07-11 NOTE — Progress Notes (Signed)

## 2020-07-13 ENCOUNTER — Other Ambulatory Visit: Payer: Self-pay

## 2020-07-13 ENCOUNTER — Encounter (HOSPITAL_BASED_OUTPATIENT_CLINIC_OR_DEPARTMENT_OTHER): Payer: Self-pay | Admitting: Orthopedic Surgery

## 2020-07-13 ENCOUNTER — Ambulatory Visit (HOSPITAL_BASED_OUTPATIENT_CLINIC_OR_DEPARTMENT_OTHER): Payer: Medicaid Other | Admitting: Anesthesiology

## 2020-07-13 ENCOUNTER — Ambulatory Visit (HOSPITAL_BASED_OUTPATIENT_CLINIC_OR_DEPARTMENT_OTHER)
Admission: RE | Admit: 2020-07-13 | Discharge: 2020-07-13 | Disposition: A | Discharge status: Home / Self Care | Payer: Medicaid Other | Attending: Orthopedic Surgery | Admitting: Orthopedic Surgery

## 2020-07-13 ENCOUNTER — Encounter (HOSPITAL_BASED_OUTPATIENT_CLINIC_OR_DEPARTMENT_OTHER)
Admission: RE | Disposition: A | Discharge status: Home / Self Care | Payer: Self-pay | Source: Home / Self Care | Attending: Orthopedic Surgery

## 2020-07-13 DIAGNOSIS — E669 Obesity, unspecified: Secondary | ICD-10-CM | POA: Insufficient documentation

## 2020-07-13 DIAGNOSIS — Z8379 Family history of other diseases of the digestive system: Secondary | ICD-10-CM | POA: Diagnosis not present

## 2020-07-13 DIAGNOSIS — G8929 Other chronic pain: Secondary | ICD-10-CM | POA: Insufficient documentation

## 2020-07-13 DIAGNOSIS — Z825 Family history of asthma and other chronic lower respiratory diseases: Secondary | ICD-10-CM | POA: Diagnosis not present

## 2020-07-13 DIAGNOSIS — F329 Major depressive disorder, single episode, unspecified: Secondary | ICD-10-CM | POA: Diagnosis not present

## 2020-07-13 DIAGNOSIS — I10 Essential (primary) hypertension: Secondary | ICD-10-CM | POA: Diagnosis not present

## 2020-07-13 DIAGNOSIS — Z79899 Other long term (current) drug therapy: Secondary | ICD-10-CM | POA: Diagnosis not present

## 2020-07-13 DIAGNOSIS — Z905 Acquired absence of kidney: Secondary | ICD-10-CM | POA: Diagnosis not present

## 2020-07-13 DIAGNOSIS — K219 Gastro-esophageal reflux disease without esophagitis: Secondary | ICD-10-CM | POA: Insufficient documentation

## 2020-07-13 DIAGNOSIS — Z91030 Bee allergy status: Secondary | ICD-10-CM | POA: Insufficient documentation

## 2020-07-13 DIAGNOSIS — M238X2 Other internal derangements of left knee: Secondary | ICD-10-CM | POA: Insufficient documentation

## 2020-07-13 DIAGNOSIS — F17210 Nicotine dependence, cigarettes, uncomplicated: Secondary | ICD-10-CM | POA: Insufficient documentation

## 2020-07-13 DIAGNOSIS — Z811 Family history of alcohol abuse and dependence: Secondary | ICD-10-CM | POA: Diagnosis not present

## 2020-07-13 DIAGNOSIS — D649 Anemia, unspecified: Secondary | ICD-10-CM | POA: Diagnosis not present

## 2020-07-13 DIAGNOSIS — Z833 Family history of diabetes mellitus: Secondary | ICD-10-CM | POA: Diagnosis not present

## 2020-07-13 DIAGNOSIS — F17200 Nicotine dependence, unspecified, uncomplicated: Secondary | ICD-10-CM | POA: Diagnosis not present

## 2020-07-13 DIAGNOSIS — Z85528 Personal history of other malignant neoplasm of kidney: Secondary | ICD-10-CM | POA: Insufficient documentation

## 2020-07-13 DIAGNOSIS — Z8371 Family history of colonic polyps: Secondary | ICD-10-CM | POA: Insufficient documentation

## 2020-07-13 DIAGNOSIS — Z6838 Body mass index (BMI) 38.0-38.9, adult: Secondary | ICD-10-CM | POA: Insufficient documentation

## 2020-07-13 DIAGNOSIS — Z8249 Family history of ischemic heart disease and other diseases of the circulatory system: Secondary | ICD-10-CM | POA: Insufficient documentation

## 2020-07-13 DIAGNOSIS — Z823 Family history of stroke: Secondary | ICD-10-CM | POA: Diagnosis not present

## 2020-07-13 DIAGNOSIS — F419 Anxiety disorder, unspecified: Secondary | ICD-10-CM | POA: Diagnosis not present

## 2020-07-13 DIAGNOSIS — Z8489 Family history of other specified conditions: Secondary | ICD-10-CM | POA: Insufficient documentation

## 2020-07-13 DIAGNOSIS — M23222 Derangement of posterior horn of medial meniscus due to old tear or injury, left knee: Secondary | ICD-10-CM | POA: Diagnosis not present

## 2020-07-13 DIAGNOSIS — S83242D Other tear of medial meniscus, current injury, left knee, subsequent encounter: Secondary | ICD-10-CM | POA: Diagnosis not present

## 2020-07-13 DIAGNOSIS — S83242A Other tear of medial meniscus, current injury, left knee, initial encounter: Secondary | ICD-10-CM | POA: Diagnosis present

## 2020-07-13 DIAGNOSIS — Z7951 Long term (current) use of inhaled steroids: Secondary | ICD-10-CM | POA: Insufficient documentation

## 2020-07-13 HISTORY — PX: KNEE ARTHROSCOPY: SHX127

## 2020-07-13 SURGERY — ARTHROSCOPY, KNEE
Anesthesia: General | Site: Knee | Laterality: Left

## 2020-07-13 MED ORDER — ASPIRIN EC 81 MG PO TBEC: 81.0000 mg | DELAYED_RELEASE_TABLET | Freq: Every day | ORAL | 0 refills | Status: DC

## 2020-07-13 MED ORDER — OXYCODONE HCL 5 MG PO TABS: 5.0000 mg | ORAL_TABLET | ORAL | 0 refills | Status: DC | PRN

## 2020-07-13 MED ORDER — LIDOCAINE 2% (20 MG/ML) 5 ML SYRINGE
INTRAMUSCULAR | Status: DC | PRN
  Administered 2020-07-13: 100 mg via INTRAVENOUS

## 2020-07-13 MED ORDER — CELECOXIB 100 MG PO CAPS: 100.0000 mg | ORAL_CAPSULE | Freq: Two times a day (BID) | ORAL | 2 refills | Status: DC

## 2020-07-13 MED ORDER — CLONIDINE HCL (ANALGESIA) 100 MCG/ML EP SOLN
EPIDURAL | Status: DC | PRN
  Administered 2020-07-13: 1 mL

## 2020-07-13 MED ORDER — LACTATED RINGERS IV SOLN: INTRAVENOUS | Status: DC

## 2020-07-13 MED ORDER — CEFAZOLIN SODIUM-DEXTROSE 2-4 GM/100ML-% IV SOLN
INTRAVENOUS | Status: AC
  Filled 2020-07-13: qty 100

## 2020-07-13 MED ORDER — FENTANYL CITRATE (PF) 100 MCG/2ML IJ SOLN
INTRAMUSCULAR | Status: DC | PRN
Start: 2020-07-13 — End: 2020-07-13
  Administered 2020-07-13: 100 ug via INTRAVENOUS

## 2020-07-13 MED ORDER — MORPHINE SULFATE (PF) 4 MG/ML IV SOLN
INTRAVENOUS | Status: AC
  Filled 2020-07-13: qty 1

## 2020-07-13 MED ORDER — HYDROMORPHONE HCL 1 MG/ML IJ SOLN
INTRAMUSCULAR | Status: AC
  Filled 2020-07-13: qty 0.5

## 2020-07-13 MED ORDER — EPHEDRINE SULFATE-NACL 50-0.9 MG/10ML-% IV SOSY
PREFILLED_SYRINGE | INTRAVENOUS | Status: DC | PRN
  Administered 2020-07-13: 15 mg via INTRAVENOUS

## 2020-07-13 MED ORDER — OXYCODONE HCL 5 MG/5ML PO SOLN: 5.0000 mg | Freq: Once | ORAL | Status: AC | PRN

## 2020-07-13 MED ORDER — SODIUM CHLORIDE 0.9 % IR SOLN: Status: DC | PRN

## 2020-07-13 MED ORDER — POVIDONE-IODINE 10 % EX SWAB: 2.0000 "application " | Freq: Once | CUTANEOUS | Status: DC

## 2020-07-13 MED ORDER — PROPOFOL 10 MG/ML IV BOLUS
INTRAVENOUS | Status: DC | PRN
  Administered 2020-07-13: 200 mg via INTRAVENOUS

## 2020-07-13 MED ORDER — OXYCODONE HCL 5 MG PO TABS
5.0000 mg | ORAL_TABLET | Freq: Once | ORAL | Status: AC | PRN
  Administered 2020-07-13: 5 mg via ORAL

## 2020-07-13 MED ORDER — DEXTROSE 5 % IV SOLN
3.0000 g | INTRAVENOUS | Status: AC
  Administered 2020-07-13: 3 g via INTRAVENOUS

## 2020-07-13 MED ORDER — HYDROMORPHONE HCL 1 MG/ML IJ SOLN
INTRAMUSCULAR | Status: AC
Start: 2020-07-13 — End: ?
  Filled 2020-07-13: qty 0.5

## 2020-07-13 MED ORDER — DEXAMETHASONE SODIUM PHOSPHATE 10 MG/ML IJ SOLN
INTRAMUSCULAR | Status: DC | PRN
  Administered 2020-07-13: 5 mg via INTRAVENOUS

## 2020-07-13 MED ORDER — ACETAMINOPHEN 500 MG PO TABS
ORAL_TABLET | ORAL | Status: AC
  Filled 2020-07-13: qty 2

## 2020-07-13 MED ORDER — BUPIVACAINE-EPINEPHRINE 0.5% -1:200000 IJ SOLN
INTRAMUSCULAR | Status: DC | PRN
  Administered 2020-07-13: 30 mL

## 2020-07-13 MED ORDER — MIDAZOLAM HCL 2 MG/2ML IJ SOLN
INTRAMUSCULAR | Status: AC
  Filled 2020-07-13: qty 2

## 2020-07-13 MED ORDER — ACETAMINOPHEN 500 MG PO TABS
1000.0000 mg | ORAL_TABLET | Freq: Once | ORAL | Status: AC
  Administered 2020-07-13: 1000 mg via ORAL

## 2020-07-13 MED ORDER — MIDAZOLAM HCL 2 MG/2ML IJ SOLN
INTRAMUSCULAR | Status: DC | PRN
  Administered 2020-07-13: 2 mg via INTRAVENOUS

## 2020-07-13 MED ORDER — CEFAZOLIN SODIUM-DEXTROSE 1-4 GM/50ML-% IV SOLN
INTRAVENOUS | Status: AC
  Filled 2020-07-13: qty 50

## 2020-07-13 MED ORDER — OXYCODONE HCL 5 MG PO TABS
ORAL_TABLET | ORAL | Status: AC
  Filled 2020-07-13: qty 1

## 2020-07-13 MED ORDER — MORPHINE SULFATE (PF) 4 MG/ML IV SOLN
INTRAVENOUS | Status: DC | PRN
Start: 2020-07-13 — End: 2020-07-13
  Administered 2020-07-13: 8 mg via INTRAVENOUS

## 2020-07-13 MED ORDER — PHENYLEPHRINE 40 MCG/ML (10ML) SYRINGE FOR IV PUSH (FOR BLOOD PRESSURE SUPPORT)
PREFILLED_SYRINGE | INTRAVENOUS | Status: DC | PRN
  Administered 2020-07-13: 80 ug via INTRAVENOUS
  Administered 2020-07-13: 120 ug via INTRAVENOUS
  Administered 2020-07-13: 80 ug via INTRAVENOUS

## 2020-07-13 MED ORDER — ONDANSETRON HCL 4 MG/2ML IJ SOLN
INTRAMUSCULAR | Status: AC
  Filled 2020-07-13: qty 2

## 2020-07-13 MED ORDER — PHENYLEPHRINE 40 MCG/ML (10ML) SYRINGE FOR IV PUSH (FOR BLOOD PRESSURE SUPPORT)
PREFILLED_SYRINGE | INTRAVENOUS | Status: AC
  Filled 2020-07-13: qty 10

## 2020-07-13 MED ORDER — EPHEDRINE 5 MG/ML INJ
INTRAVENOUS | Status: AC
  Filled 2020-07-13: qty 10

## 2020-07-13 MED ORDER — ONDANSETRON HCL 4 MG/2ML IJ SOLN: 4.0000 mg | Freq: Once | INTRAMUSCULAR | Status: DC | PRN

## 2020-07-13 MED ORDER — DEXMEDETOMIDINE HCL IN NACL 400 MCG/100ML IV SOLN
INTRAVENOUS | Status: DC | PRN
  Administered 2020-07-13: 8 ug via INTRAVENOUS

## 2020-07-13 MED ORDER — FENTANYL CITRATE (PF) 100 MCG/2ML IJ SOLN
25.0000 ug | INTRAMUSCULAR | Status: DC | PRN
  Administered 2020-07-13 (×2): 50 ug via INTRAVENOUS

## 2020-07-13 MED ORDER — FENTANYL CITRATE (PF) 100 MCG/2ML IJ SOLN
INTRAMUSCULAR | Status: AC
  Filled 2020-07-13: qty 2

## 2020-07-13 MED ORDER — POVIDONE-IODINE 7.5 % EX SOLN: Freq: Once | CUTANEOUS | Status: AC

## 2020-07-13 MED ORDER — DEXMEDETOMIDINE (PRECEDEX) IN NS 20 MCG/5ML (4 MCG/ML) IV SYRINGE
PREFILLED_SYRINGE | INTRAVENOUS | Status: AC
  Filled 2020-07-13: qty 5

## 2020-07-13 MED ORDER — HYDROMORPHONE HCL 1 MG/ML IJ SOLN
0.5000 mg | INTRAMUSCULAR | Status: DC | PRN
  Administered 2020-07-13 (×2): 0.5 mg via INTRAVENOUS

## 2020-07-13 MED ORDER — ONDANSETRON HCL 4 MG/2ML IJ SOLN
INTRAMUSCULAR | Status: DC | PRN
  Administered 2020-07-13: 4 mg via INTRAVENOUS

## 2020-07-13 MED ORDER — OXYCODONE HCL 5 MG PO TABS: 5.0000 mg | ORAL_TABLET | Freq: Four times a day (QID) | ORAL | 0 refills | Status: DC | PRN

## 2020-07-13 MED ORDER — CLONIDINE HCL (ANALGESIA) 100 MCG/ML EP SOLN
EPIDURAL | Status: AC
  Filled 2020-07-13: qty 10

## 2020-07-13 SURGICAL SUPPLY — 60 items
BANDAGE ESMARK 6X9 LF (GAUZE/BANDAGES/DRESSINGS) IMPLANT
BLADE EXCALIBUR 4.0MM X 13CM (MISCELLANEOUS) ×1
BLADE EXCALIBUR 4.0X13 (MISCELLANEOUS) ×2 IMPLANT
BLADE SURG 15 STRL LF DISP TIS (BLADE) IMPLANT
BLADE SURG 15 STRL SS (BLADE)
BNDG CMPR 9X6 STRL LF SNTH (GAUZE/BANDAGES/DRESSINGS)
BNDG ELASTIC 4X5.8 VLCR STR LF (GAUZE/BANDAGES/DRESSINGS) ×3 IMPLANT
BNDG ELASTIC 6X5.8 VLCR STR LF (GAUZE/BANDAGES/DRESSINGS) ×3 IMPLANT
BNDG ESMARK 6X9 LF (GAUZE/BANDAGES/DRESSINGS)
CANNULA 5.75X7 CRYSTAL CLEAR (CANNULA) ×3 IMPLANT
CANNULA TWIST IN 8.25X7CM (CANNULA) ×3 IMPLANT
COVER WAND RF STERILE (DRAPES) IMPLANT
CUFF TOURN SGL QUICK 34 (TOURNIQUET CUFF) ×3
CUFF TRNQT CYL 34X4.125X (TOURNIQUET CUFF) ×1 IMPLANT
DISSECTOR  3.8MM X 13CM (MISCELLANEOUS) ×2
DISSECTOR 3.8MM X 13CM (MISCELLANEOUS) ×1 IMPLANT
DRAPE ARTHROSCOPY W/POUCH 90 (DRAPES) ×3 IMPLANT
DRAPE INCISE IOBAN 66X45 STRL (DRAPES) IMPLANT
DRAPE U-SHAPE 47X51 STRL (DRAPES) ×6 IMPLANT
DRSG TEGADERM 4X4.75 (GAUZE/BANDAGES/DRESSINGS) ×6 IMPLANT
DURAPREP 26ML APPLICATOR (WOUND CARE) ×3 IMPLANT
DW OUTFLOW CASSETTE/TUBE SET (MISCELLANEOUS) IMPLANT
ELECT REM PT RETURN 9FT ADLT (ELECTROSURGICAL)
ELECTRODE REM PT RTRN 9FT ADLT (ELECTROSURGICAL) IMPLANT
EXCALIBUR 3.8MM X 13CM (MISCELLANEOUS) IMPLANT
GAUZE 4X4 16PLY RFD (DISPOSABLE) IMPLANT
GAUZE SPONGE 4X4 12PLY STRL (GAUZE/BANDAGES/DRESSINGS) ×3 IMPLANT
GAUZE XEROFORM 1X8 LF (GAUZE/BANDAGES/DRESSINGS) ×3 IMPLANT
GLOVE BIO SURGEON STRL SZ7 (GLOVE) ×3 IMPLANT
GLOVE BIOGEL M 6.5 STRL (GLOVE) ×3 IMPLANT
GLOVE BIOGEL PI IND STRL 6.5 (GLOVE) ×1 IMPLANT
GLOVE BIOGEL PI IND STRL 7.0 (GLOVE) ×1 IMPLANT
GLOVE BIOGEL PI IND STRL 8 (GLOVE) ×1 IMPLANT
GLOVE BIOGEL PI INDICATOR 6.5 (GLOVE) ×2
GLOVE BIOGEL PI INDICATOR 7.0 (GLOVE) ×2
GLOVE BIOGEL PI INDICATOR 8 (GLOVE) ×2
GLOVE SURG ORTHO 8.0 STRL STRW (GLOVE) ×3 IMPLANT
GOWN STRL REUS W/ TWL LRG LVL3 (GOWN DISPOSABLE) ×2 IMPLANT
GOWN STRL REUS W/ TWL XL LVL3 (GOWN DISPOSABLE) ×1 IMPLANT
GOWN STRL REUS W/TWL LRG LVL3 (GOWN DISPOSABLE) ×6
GOWN STRL REUS W/TWL XL LVL3 (GOWN DISPOSABLE) ×3
HOLDER KNEE FOAM BLUE (MISCELLANEOUS) IMPLANT
KNEE WRAP E Z 3 GEL PACK (MISCELLANEOUS) ×3 IMPLANT
MANIFOLD NEPTUNE II (INSTRUMENTS) ×3 IMPLANT
NDL SAFETY ECLIPSE 18X1.5 (NEEDLE) ×2 IMPLANT
NEEDLE HYPO 18GX1.5 BLUNT FILL (NEEDLE) ×3 IMPLANT
NEEDLE HYPO 18GX1.5 SHARP (NEEDLE) ×6
PACK ARTHROSCOPY DSU (CUSTOM PROCEDURE TRAY) ×3 IMPLANT
PACK BASIN DAY SURGERY FS (CUSTOM PROCEDURE TRAY) ×3 IMPLANT
PADDING CAST COTTON 6X4 STRL (CAST SUPPLIES) ×3 IMPLANT
PENCIL SMOKE EVACUATOR (MISCELLANEOUS) IMPLANT
PORT APPOLLO RF 90DEGREE MULTI (SURGICAL WAND) ×3 IMPLANT
SUT ETHILON 3 0 PS 1 (SUTURE) ×6 IMPLANT
SUT VIC AB 2-0 SH 27 (SUTURE)
SUT VIC AB 2-0 SH 27XBRD (SUTURE) IMPLANT
SYR 5ML LL (SYRINGE) IMPLANT
SYR TB 1ML LL NO SAFETY (SYRINGE) ×3 IMPLANT
TOWEL GREEN STERILE FF (TOWEL DISPOSABLE) ×3 IMPLANT
TRAY DSU PREP LF (CUSTOM PROCEDURE TRAY) ×3 IMPLANT
TUBING ARTHROSCOPY IRRIG 16FT (MISCELLANEOUS) IMPLANT

## 2020-07-13 NOTE — Op Note (Signed)
NAMETOBI, LEINWEBER MEDICAL RECORD [id number] ACCOUNT [id number] DATE OF BIRTH:1976-05-27 FACILITY: [facility] LOCATION: [facility] PHYSICIAN:Lakela Kuba Randel Pigg, MD  OPERATIVE REPORT  DATE OF PROCEDURE:  07/13/2020  PREOPERATIVE DIAGNOSIS:  Left knee meniscal tear with posterior cruciate ligament cyst.  POSTOPERATIVE DIAGNOSIS:  Left knee meniscal tear with posterior cruciate ligament cyst.  PROCEDURE:  Left knee arthroscopy with partial medial meniscectomy and decompression posterior cruciate ligament cyst.  SURGEON:  Meredith Pel, MD  ASSISTANT:  Annie Main, PA.  INDICATIONS:  This is a 44 year old patient with long history of left knee pain.  He has left knee medial meniscal tear, along with a PCL cyst.  He presents now for arthroscopy and debridement of both.  PROCEDURE IN DETAIL:  The patient was brought to the operating room where general anesthetic was induced.  Preoperative antibiotics administered.  Timeout was called.  Left leg prescrubbed with alcohol and Betadine, allowed to air dry, prepped with  DuraPrep solution and draped in a sterile manner.  Charlie Pitter was not used in this case.  Timeout was called.  The left knee was examined under anesthesia was found to have full range of motion with good stability to varus and valgus stress at 0 and 30  degrees, along with intact ACL and PCL.  Portal sites were anesthetized using Marcaine with epinephrine.  Anterior inferolateral portal was established, anterior inferomedial portal was established under direct visualization.  Diagnostic arthroscopy was  performed.  ACL, PCL intact.  The lateral compartment was intact with intact articular cartilage and meniscus.  Patellofemoral compartment was also intact.  No loose bodies in the medial and lateral gutter.  On the medial side, the patient did have some  early wear on the medial femoral condyle, grade 2-3 over 30% of the weightbearing surface area.  No tibial changes.  Tear of the  posterior horn of the medial meniscus was present involving 40% anterior, posterior width and volume of the meniscus.  This  was debrided back to a stable rim using combination of basket punch and shavers.  Next, the scope was placed into the posterior compartment.  Accessory posteromedial superior portal was created with spinal needle localization.  The Arthrocare wand was  then used to decompress the cyst adjacent to the posterior aspect of the PCL.  The PCL itself was preserved.  Complete and thorough decompression was performed without injury to the posterior neurovascular structures.  This was confirmed visually with  portal reversal.  Next, the knee joint was thoroughly irrigated.  The instruments were removed.  Portals closed using 2-0 Vicryl, 3-0 nylon.  Solution of Marcaine, morphine, clonidine injected into the knee for postop pain relief.  Ace wrap and ice pack  applied.  The patient tolerated the procedure well without immediate complications.  He was transferred to the recovery room in stable condition.  Luke's assistance was required for limb positioning, opening and closing.  His assistance was a medical  necessity.  VN/NUANCE  D:07/13/2020 T:07/13/2020 JOB:[id number]

## 2020-07-13 NOTE — Anesthesia Procedure Notes (Signed)
Procedure Name: LMA Insertion Date/Time: 07/13/2020 2:04 PM Performed by: [place] Nicklos Gaxiola C, CRNA Pre-anesthesia Checklist: Patient identified, Emergency Drugs available, Suction available and Patient being monitored Patient Re-evaluated:Patient Re-evaluated prior to induction Oxygen Delivery Method: Circle system utilized Preoxygenation: Pre-oxygenation with 100% oxygen Induction Type: IV induction Ventilation: Mask ventilation without difficulty LMA: LMA inserted LMA Size: 4.0 Number of attempts: 1 Airway Equipment and Method: Bite block Placement Confirmation: positive ETCO2 Tube secured with: Tape Dental Injury: Teeth and Oropharynx as per pre-operative assessment

## 2020-07-13 NOTE — Anesthesia Postprocedure Evaluation (Signed)
Anesthesia Post Note  Patient: [name]  Procedure(s) Performed: left knee arthroscopy, debridement, cyst decompression (Left Knee)     Patient location during evaluation: PACU Anesthesia Type: General Level of consciousness: awake and alert Pain management: pain level controlled Vital Signs Assessment: post-procedure vital signs reviewed and stable Respiratory status: spontaneous breathing, nonlabored ventilation and respiratory function stable Cardiovascular status: blood pressure returned to baseline and stable Postop Assessment: no apparent nausea or vomiting Anesthetic complications: no   No complications documented.  Last Vitals:  Vitals:   07/13/20 1545 07/13/20 1600  BP: (!) 119/58 102/66  Pulse: 96 93  Resp: 14 14  Temp:    SpO2: 93% 92%    Last Pain:  Vitals:   07/13/20 1557  TempSrc:   PainSc: 7                  Dexton Zwilling A.
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
## 2020-07-13 NOTE — Transfer of Care (Signed)
Immediate Anesthesia Transfer of Care Note  Patient: Nycere Kearse  Procedure(s) Performed: left knee arthroscopy, debridement, cyst decompression (Left Knee)  Patient Location: PACU  Anesthesia Type:General  Level of Consciousness: drowsy and patient cooperative  Airway & Oxygen Therapy: Patient Spontanous Breathing and Patient connected to face mask oxygen  Post-op Assessment: Report given to RN and Post -op Vital signs reviewed and stable  Post vital signs: Reviewed and stable  Last Vitals:  Vitals Value Taken Time  BP 129/86 07/13/20 1506  Temp 36.4 C 07/13/20 1506  Pulse 95 07/13/20 1507  Resp 11 07/13/20 1507  SpO2 98 % 07/13/20 1507  Vitals shown include unvalidated device data.  Last Pain:  Vitals:   07/13/20 1222  TempSrc: Oral  PainSc: 7       Patients Stated Pain Goal: 7 (88/89/16 9450)  Complications: No complications documented.
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
## 2020-07-13 NOTE — Discharge Instructions (Signed)
Next dose of Tylenol can be given at 7:15pm if needed. Next dose of Oxycodone can be given at 10:15pm if needed.   Post Anesthesia Home Care Instructions  Activity: Get plenty of rest for the remainder of the day. A responsible individual must stay with you for 24 hours following the procedure.  For the next 24 hours, DO NOT: -Drive a car -Paediatric nurse -Drink alcoholic beverages -Take any medication unless instructed by your physician -Make any legal decisions or sign important papers.  Meals: Start with liquid foods such as gelatin or soup. Progress to regular foods as tolerated. Avoid greasy, spicy, heavy foods. If nausea and/or vomiting occur, drink only clear liquids until the nausea and/or vomiting subsides. Call your physician if vomiting continues.  Special Instructions/Symptoms: Your throat may feel dry or sore from the anesthesia or the breathing tube placed in your throat during surgery. If this causes discomfort, gargle with warm salt water. The discomfort should disappear within 24 hours.

## 2020-07-13 NOTE — Brief Op Note (Signed)
   07/13/2020  3:06 PM  PATIENT:  [name]  44 y.o. male  PRE-OPERATIVE DIAGNOSIS:  left knee medial meniscal cyst, meniscal tear  POST-OPERATIVE DIAGNOSIS:  left knee medial meniscal cyst, meniscal tear  PROCEDURE:  Procedure(s): left knee arthroscopy, meniscal  debridement, cyst decompression  SURGEON:  Surgeon(s): Meredith Pel, MD  ASSISTANT: magnant pa  ANESTHESIA:   general  EBL: 4 ml    Total I/O In: 1000 [I.V.:1000] Out: -   BLOOD ADMINISTERED: none  DRAINS: none   LOCAL MEDICATIONS USED:  Marcaine mso4 clonidine  SPECIMEN:  No Specimen  COUNTS:  YES  TOURNIQUET:  * Missing tourniquet times found for documented tourniquets in log: [id number] *  DICTATION: .Other Dictation: Dictation Number [id number]  PLAN OF CARE: Discharge to home after PACU  PATIENT DISPOSITION:  PACU - hemodynamically stable

## 2020-07-14 ENCOUNTER — Telehealth: Payer: Self-pay

## 2020-07-14 ENCOUNTER — Encounter (HOSPITAL_BASED_OUTPATIENT_CLINIC_OR_DEPARTMENT_OTHER): Payer: Self-pay | Admitting: Orthopedic Surgery

## 2020-07-14 NOTE — Telephone Encounter (Signed)
Patient called he needs prescription to be filled he stated he had surgery yesterday and he is in pain ,he is requesting to talk to Dr.Dean asap. Call back:[phone].

## 2020-07-14 NOTE — Telephone Encounter (Signed)
Can you advise? It looks like the rx was printed?

## 2020-07-14 NOTE — Telephone Encounter (Signed)
I spoke with patient and pharmacy. They state insurance will only cover 7 day supply of Oxycodone 5mg .  Can you please change to #28 instead of #30 and send in?  The insurance requires a physician send in so [facility] cannot do that. They will also not take a verbal change on quantity since it is a narcotic.  Thanks.

## 2020-07-14 NOTE — Telephone Encounter (Signed)
Yeah Dr Marlou Sa printed and signed his prescription yesterday.  If he somehow never received that written prescription then Dr Marlou Sa will have to submit a e-prescription because patients insurance requires narcotics to be prescribed by physician rather than PA apparently

## 2020-07-15 ENCOUNTER — Other Ambulatory Visit: Payer: Self-pay | Admitting: Orthopedic Surgery

## 2020-07-15 MED ORDER — OXYCODONE HCL 5 MG PO CAPS
5.0000 mg | ORAL_CAPSULE | Freq: Four times a day (QID) | ORAL | 0 refills | Status: DC | PRN
Start: 2020-07-15 — End: 2020-08-07

## 2020-07-15 MED ORDER — OXYCODONE HCL 5 MG PO TABS
5.0000 mg | ORAL_TABLET | Freq: Four times a day (QID) | ORAL | 0 refills | Status: DC | PRN
Start: 2020-07-15 — End: 2020-07-17

## 2020-07-15 NOTE — Telephone Encounter (Signed)
Done thx

## 2020-07-17 ENCOUNTER — Other Ambulatory Visit: Payer: Self-pay

## 2020-07-17 ENCOUNTER — Encounter: Payer: Self-pay | Admitting: Physical Medicine and Rehabilitation

## 2020-07-17 ENCOUNTER — Encounter: Payer: Medicaid Other | Attending: Psychology | Admitting: Physical Medicine and Rehabilitation

## 2020-07-17 ENCOUNTER — Other Ambulatory Visit: Payer: Self-pay | Admitting: Physical Medicine and Rehabilitation

## 2020-07-17 VITALS — BP 150/100 | HR 105 | Temp 98.6°F | Ht 74.0 in | Wt 314.0 lb

## 2020-07-17 DIAGNOSIS — N50812 Left testicular pain: Secondary | ICD-10-CM | POA: Insufficient documentation

## 2020-07-17 DIAGNOSIS — G894 Chronic pain syndrome: Secondary | ICD-10-CM | POA: Diagnosis present

## 2020-07-17 DIAGNOSIS — F419 Anxiety disorder, unspecified: Secondary | ICD-10-CM | POA: Insufficient documentation

## 2020-07-17 DIAGNOSIS — M62838 Other muscle spasm: Secondary | ICD-10-CM

## 2020-07-17 DIAGNOSIS — F329 Major depressive disorder, single episode, unspecified: Secondary | ICD-10-CM | POA: Diagnosis present

## 2020-07-17 DIAGNOSIS — Z5181 Encounter for therapeutic drug level monitoring: Secondary | ICD-10-CM | POA: Insufficient documentation

## 2020-07-17 DIAGNOSIS — G8922 Chronic post-thoracotomy pain: Secondary | ICD-10-CM | POA: Diagnosis not present

## 2020-07-17 DIAGNOSIS — M25562 Pain in left knee: Secondary | ICD-10-CM | POA: Insufficient documentation

## 2020-07-17 DIAGNOSIS — G8912 Acute post-thoracotomy pain: Secondary | ICD-10-CM | POA: Insufficient documentation

## 2020-07-17 DIAGNOSIS — F4310 Post-traumatic stress disorder, unspecified: Secondary | ICD-10-CM | POA: Insufficient documentation

## 2020-07-17 DIAGNOSIS — Z79891 Long term (current) use of opiate analgesic: Secondary | ICD-10-CM | POA: Insufficient documentation

## 2020-07-17 DIAGNOSIS — M7918 Myalgia, other site: Secondary | ICD-10-CM

## 2020-07-17 MED ORDER — OXYCODONE HCL 5 MG PO TABS: 5.0000 mg | ORAL_TABLET | ORAL | 0 refills | Status: DC | PRN

## 2020-07-17 NOTE — Patient Instructions (Addendum)
Plan: 1. W/C handicapped placard. Will do placard for pt-   2. Filled Oxycodone since surgeon tried to send it in, but couldn't- since insurance wouldn't let him.   3. Can't fill for Valium for Vertigo. Let that doctor fill that Rx. 2 mg TID  4. Patient here for trigger point injections for secondary myofascial pain  Consent done and on chart.  Cleaned areas with alcohol and injected using a 27 gauge 1.5 inch needle  Injected  9cc Using 1% Lidocaine with no EPI  Upper traps B/L Levators Posterior scalenes B/L Middle scalenes Splenius Capitus B/L Pectoralis Major B/L Rhomboids B/L x2 Infraspinatus Teres Major/minor L only Thoracic paraspinals B/L Lumbar paraspinals b/L x2 Other injections- L thoracic scar- around it   Patient's level of pain prior was- no change- always takes awhile Current level of pain after injections is  There was no bleeding or complications.  Patient was advised to drink a lot of water on day after injections to flush system Will have increased soreness for 12-48 hours after injections.  Can use Lidocaine patches the day AFTER injections Can use theracane on day of injections in places didn't inject Can use heating pad 4-6 hours AFTER injections    5. Suggest MRIs of neck and lumbar spine. Due to pain complaints that are progressively worse   6. F/U in 1 month for trigger point injections

## 2020-07-17 NOTE — Progress Notes (Signed)
Patient is a 44 yr old male with hx of chronic pain syndromehere for f/u.   Had Meniscal surgery last Thursday.  Depressed a cyst.  Hip is still hurting.   Wants to see him Friday- going to line up a new MRI for low back after f/u.     Filled his Oxycodone.  7 days since had L knee surgery last week- meniscal.   Xtampza last filled 9/16- has 34 pills left   Is taking Trileptal- 900 mg QHS-   Plan: 1. W/C handicapped placard. Will do placard for pt-   2. Filled Oxycodone since surgeon tried to send it in, but couldn't- since insurance wouldn't let him.   3. Can't fill for Valium for Vertigo. Let that doctor fill that Rx.   4. Patient here for trigger point injections for secondary myofascial pain  Consent done and on chart.  Cleaned areas with alcohol and injected using a 27 gauge 1.5 inch needle  Injected  9cc Using 1% Lidocaine with no EPI  Upper traps B/L Levators Posterior scalenes B/L Middle scalenes Splenius Capitus B/L Pectoralis Major B/L Rhomboids B/L x2 Infraspinatus Teres Major/minor L only Thoracic paraspinals B/L Lumbar paraspinals b/L x2 Other injections- L thoracic scar- around it   Patient's level of pain prior was- no change- always takes awhile Current level of pain after injections is  There was no bleeding or complications.  Patient was advised to drink a lot of water on day after injections to flush system Will have increased soreness for 12-48 hours after injections.  Can use Lidocaine patches the day AFTER injections Can use theracane on day of injections in places didn't inject Can use heating pad 4-6 hours AFTER injections    5. Suggest MRIs of neck and lumbar spine. Due to pain complaints that are progressively worse   6. F/U in 1 month for trigger point injections  I spent a total of 35 minutes on visit- as detailed above.

## 2020-07-19 ENCOUNTER — Telehealth: Payer: Self-pay

## 2020-07-19 NOTE — Telephone Encounter (Signed)
LMOM of the below message  

## 2020-07-19 NOTE — Telephone Encounter (Signed)
Pls advise.  

## 2020-07-19 NOTE — Telephone Encounter (Signed)
Patient called in saying he took a shower says bandages is rolling , want to know if he should keep them on until his appt on 10/01

## 2020-07-19 NOTE — Telephone Encounter (Signed)
If they are peeling off a lot, can take off and keep dry dressing or bandaids over them until 10/1.  Avoid getting them wet in shower until 10/1.  Will examine them on that day in office

## 2020-07-20 ENCOUNTER — Inpatient Hospital Stay: Payer: Medicaid Other | Admitting: Orthopedic Surgery

## 2020-07-21 ENCOUNTER — Ambulatory Visit: Payer: Self-pay

## 2020-07-21 ENCOUNTER — Ambulatory Visit (INDEPENDENT_AMBULATORY_CARE_PROVIDER_SITE_OTHER): Payer: Medicaid Other | Admitting: Orthopedic Surgery

## 2020-07-21 DIAGNOSIS — Z9889 Other specified postprocedural states: Secondary | ICD-10-CM

## 2020-07-21 DIAGNOSIS — M79605 Pain in left leg: Secondary | ICD-10-CM | POA: Diagnosis not present

## 2020-07-21 DIAGNOSIS — M79604 Pain in right leg: Secondary | ICD-10-CM | POA: Diagnosis not present

## 2020-07-22 ENCOUNTER — Encounter: Payer: Self-pay | Admitting: Orthopedic Surgery
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
## 2020-07-23 ENCOUNTER — Encounter: Payer: Self-pay | Admitting: Orthopedic Surgery

## 2020-07-24 ENCOUNTER — Other Ambulatory Visit: Payer: Self-pay

## 2020-07-24 DIAGNOSIS — M79604 Pain in right leg: Secondary | ICD-10-CM

## 2020-07-27 ENCOUNTER — Telehealth: Payer: Self-pay | Admitting: Orthopedic Surgery

## 2020-07-27 ENCOUNTER — Telehealth: Payer: Self-pay

## 2020-07-27 ENCOUNTER — Other Ambulatory Visit: Payer: Self-pay | Admitting: Surgical

## 2020-07-27 ENCOUNTER — Other Ambulatory Visit: Payer: Self-pay

## 2020-07-27 MED ORDER — OXYCODONE HCL 5 MG PO TABS
5.0000 mg | ORAL_TABLET | Freq: Four times a day (QID) | ORAL | 0 refills | Status: DC | PRN
Start: 2020-07-27 — End: 2020-08-07

## 2020-07-27 NOTE — Telephone Encounter (Signed)
Sent in refill.  Patient had no calf pain when we saw him last but he can come in to see me tomorrow or we can refer him for US of the operative extremity.  If he is having severe pain that is debilitating in his calf, recommend he report to urgent care or ED tonight for [place].

## 2020-07-27 NOTE — Telephone Encounter (Signed)
Pt called requesting a refill of his pain rx and pt had also mentioned a spot on the back of his calf and was worried it may be a blood clot so I did let pt speak with triage line

## 2020-07-27 NOTE — Telephone Encounter (Signed)
See other note also. Please advise. Thanks.

## 2020-07-27 NOTE — Telephone Encounter (Signed)
Patient called stating that he is having right leg pain under his right calf and it hurts to touch. Stated right leg pain since yesterday, wondering if it may be a blood clot.  Left knee surgery on 07/13/2020.  Cb# [phone].  Please advise.  Thank you.

## 2020-07-27 NOTE — Telephone Encounter (Signed)
Can either of you advise? 

## 2020-07-28 ENCOUNTER — Emergency Department (HOSPITAL_BASED_OUTPATIENT_CLINIC_OR_DEPARTMENT_OTHER): Payer: Medicaid Other

## 2020-07-28 ENCOUNTER — Ambulatory Visit (INDEPENDENT_AMBULATORY_CARE_PROVIDER_SITE_OTHER): Payer: Medicaid Other | Admitting: Surgical

## 2020-07-28 ENCOUNTER — Emergency Department (HOSPITAL_COMMUNITY)
Admission: EM | Admit: 2020-07-28 | Discharge: 2020-07-28 | Disposition: A | Discharge status: Home / Self Care | Payer: Medicaid Other | Attending: Emergency Medicine | Admitting: Emergency Medicine

## 2020-07-28 ENCOUNTER — Emergency Department (HOSPITAL_COMMUNITY): Payer: Medicaid Other

## 2020-07-28 ENCOUNTER — Other Ambulatory Visit: Payer: Self-pay

## 2020-07-28 DIAGNOSIS — Z85528 Personal history of other malignant neoplasm of kidney: Secondary | ICD-10-CM | POA: Insufficient documentation

## 2020-07-28 DIAGNOSIS — M25561 Pain in right knee: Secondary | ICD-10-CM | POA: Insufficient documentation

## 2020-07-28 DIAGNOSIS — Z86010 Personal history of colonic polyps: Secondary | ICD-10-CM | POA: Diagnosis not present

## 2020-07-28 DIAGNOSIS — M549 Dorsalgia, unspecified: Secondary | ICD-10-CM | POA: Diagnosis not present

## 2020-07-28 DIAGNOSIS — M546 Pain in thoracic spine: Secondary | ICD-10-CM

## 2020-07-28 DIAGNOSIS — M25562 Pain in left knee: Secondary | ICD-10-CM | POA: Diagnosis not present

## 2020-07-28 DIAGNOSIS — I10 Essential (primary) hypertension: Secondary | ICD-10-CM | POA: Insufficient documentation

## 2020-07-28 DIAGNOSIS — M79605 Pain in left leg: Secondary | ICD-10-CM | POA: Insufficient documentation

## 2020-07-28 DIAGNOSIS — F17210 Nicotine dependence, cigarettes, uncomplicated: Secondary | ICD-10-CM | POA: Diagnosis not present

## 2020-07-28 DIAGNOSIS — R609 Edema, unspecified: Secondary | ICD-10-CM | POA: Diagnosis not present

## 2020-07-28 DIAGNOSIS — M79604 Pain in right leg: Secondary | ICD-10-CM

## 2020-07-28 DIAGNOSIS — Z79899 Other long term (current) drug therapy: Secondary | ICD-10-CM | POA: Insufficient documentation

## 2020-07-28 DIAGNOSIS — R079 Chest pain, unspecified: Secondary | ICD-10-CM

## 2020-07-28 LAB — BASIC METABOLIC PANEL
Anion gap: 11 (ref 5–15)
BUN: 9 mg/dL (ref 6–20)
CO2: 26 mmol/L (ref 22–32)
Calcium: 9.5 mg/dL (ref 8.9–10.3)
Chloride: 100 mmol/L (ref 98–111)
Creatinine, Ser: 0.93 mg/dL (ref 0.61–1.24)
GFR, Estimated: >60 mL/min (ref >60)
Glucose, Bld: 90 mg/dL (ref 70–99)
Potassium: 4.5 mmol/L (ref 3.5–5.1)
Sodium: 137 mmol/L (ref 135–145)

## 2020-07-28 LAB — CBC WITH DIFFERENTIAL/PLATELET
Abs Immature Granulocytes: 0.05 10*3/uL (ref 0.00–0.07)
Basophils Absolute: 0 10*3/uL (ref 0.0–0.1)
Basophils Relative: 1 %
Eosinophils Absolute: 0.4 10*3/uL (ref 0.0–0.5)
Eosinophils Relative: 6 %
HCT: 42.9 % (ref 39.0–52.0)
Hemoglobin: 14.3 g/dL (ref 13.0–17.0)
Immature Granulocytes: 1 %
Lymphocytes Relative: 26 %
Lymphs Abs: 1.7 10*3/uL (ref 0.7–4.0)
MCH: 29.9 pg (ref 26.0–34.0)
MCHC: 33.3 g/dL (ref 30.0–36.0)
MCV: 89.7 fL (ref 80.0–100.0)
Monocytes Absolute: 0.6 10*3/uL (ref 0.1–1.0)
Monocytes Relative: 9 %
Neutro Abs: 3.7 10*3/uL (ref 1.7–7.7)
Neutrophils Relative %: 57 %
Platelets: 301 10*3/uL (ref 150–400)
RBC: 4.78 MIL/uL (ref 4.22–5.81)
RDW: 12.6 % (ref 11.5–15.5)
WBC: 6.4 10*3/uL (ref 4.0–10.5)
nRBC: 0 % (ref 0.0–0.2)

## 2020-07-28 LAB — TROPONIN I (HIGH SENSITIVITY)
Troponin I (High Sensitivity): 4 ng/L (ref <18)
Troponin I (High Sensitivity): 4 ng/L (ref <18)

## 2020-07-28 LAB — D-DIMER, QUANTITATIVE: D-Dimer, Quant: 1.27 ug/mL-FEU — ABNORMAL HIGH (ref 0.00–0.50)

## 2020-07-28 LAB — ETHANOL: Alcohol, Ethyl (B): <10 mg/dL (ref <10)

## 2020-07-28 MED ORDER — MORPHINE SULFATE (PF) 4 MG/ML IV SOLN
4.0000 mg | Freq: Once | INTRAVENOUS | Status: AC
  Administered 2020-07-28: 4 mg via INTRAVENOUS
  Filled 2020-07-28: qty 1

## 2020-07-28 MED ORDER — HYDROMORPHONE HCL 1 MG/ML IJ SOLN
1.0000 mg | Freq: Once | INTRAMUSCULAR | Status: AC
  Administered 2020-07-28: 1 mg via INTRAVENOUS
  Filled 2020-07-28: qty 1

## 2020-07-28 MED ORDER — IOHEXOL 350 MG/ML SOLN
100.0000 mL | Freq: Once | INTRAVENOUS | Status: AC | PRN
  Administered 2020-07-28: 100 mL via INTRAVENOUS

## 2020-07-28 NOTE — ED Triage Notes (Signed)
Pt arrived via [facility] from the orthopedics office. Pt states that his left and right knees hurt and pain is radiating up and down both legs accompanied by tingling. Pt had knee Surgery last week. Pt states chest uncomfortable but could  not explain toi EMS why.EMS vitals 150/86, HR 90 100%

## 2020-07-28 NOTE — ED Notes (Signed)
Pedal pulses good

## 2020-07-28 NOTE — Telephone Encounter (Signed)
Seen in office this afternoon-transferred to ER due to bilat calf pain, sweating, with chest pain.
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
## 2020-07-28 NOTE — Progress Notes (Signed)
Lower extremity venous has been completed.   Preliminary results in CV Proc.   Abram Sander 07/28/2020 4:40 PM

## 2020-07-28 NOTE — Discharge Instructions (Addendum)
Follow-up with your primary doctor as well as your orthopedist.  Return to ER if you develop worsening chest pain, shortness of breath or other new concerning symptom.  Take your previously prescribed pain medication as needed.

## 2020-07-28 NOTE — ED Notes (Signed)
rehooked pt to leads and BP cuff. Emptied urinals. Pt stated that the pain medication was not effective be he was ok

## 2020-07-28 NOTE — ED Notes (Signed)
Unable to get discharge vitals  The pt very impatient to leave  He has pulled out his iv he reports gping to get hids own med  For his pain

## 2020-07-28 NOTE — Telephone Encounter (Signed)
Tried calling patient. No answer. No VM to LM.

## 2020-07-28 NOTE — ED Notes (Signed)
The pt wants somrthing for pain edp is tied up in a cpr and cannot give any orderss

## 2020-07-28 NOTE — ED Notes (Signed)
Off floor to CT 

## 2020-07-28 NOTE — ED Notes (Signed)
Back from imaging.

## 2020-07-31 ENCOUNTER — Telehealth: Payer: Self-pay | Admitting: Surgery

## 2020-07-31 NOTE — Telephone Encounter (Signed)
[facility] ED RNCM received call from [facility] requesting AVS to be resent because 2 pages did not come in. CM reviewed and noted AVS was sent on discharge. 10/8 CM attempted to resend. [phone] [phone] fax continue to fail on sending

## 2020-08-02 ENCOUNTER — Telehealth: Payer: Self-pay | Admitting: Orthopedic Surgery

## 2020-08-02 NOTE — Telephone Encounter (Signed)
Please advise. Thanks.  

## 2020-08-02 NOTE — Telephone Encounter (Signed)
Pt would like to know if he was supposed to set a F/U appt from his 07/28/20 appt since last time he was here he had to go to the ER? Pt would also like a referral for PT  [phone]

## 2020-08-03 ENCOUNTER — Other Ambulatory Visit: Payer: Self-pay

## 2020-08-03 ENCOUNTER — Other Ambulatory Visit: Payer: Self-pay | Admitting: Surgical

## 2020-08-03 DIAGNOSIS — Z9889 Other specified postprocedural states: Secondary | ICD-10-CM

## 2020-08-03 NOTE — Telephone Encounter (Signed)
Pls advise.  

## 2020-08-03 NOTE — Telephone Encounter (Signed)
Tried calling. No answer. Left detailed VM advising to call office to schedule appt. Also put in order for PT

## 2020-08-03 NOTE — Telephone Encounter (Signed)
Followup in first week of November.  Okay for PT

## 2020-08-04 ENCOUNTER — Telehealth: Payer: Self-pay | Admitting: *Deleted

## 2020-08-04 ENCOUNTER — Telehealth: Payer: Self-pay | Admitting: Orthopedic Surgery

## 2020-08-04 MED ORDER — XTAMPZA ER 36 MG PO C12A
36.0000 mg | EXTENDED_RELEASE_CAPSULE | Freq: Two times a day (BID) | ORAL | 0 refills | Status: DC
Start: 2020-08-04 — End: 2020-08-07

## 2020-08-04 NOTE — Telephone Encounter (Signed)
Holding for Lauren  I called pt and advised that Dr. Marlou Sa is in the OR today and that I will hold this message to address on Monday. Being that he called another provider to also ask for pain medication I will ask that he wait for Dr. Marlou Sa to advise vs asking another provider in the office. Pt voiced understanding and will await call on Monday.

## 2020-08-04 NOTE — Telephone Encounter (Signed)
Per Dr Dagoberto Ligas who will be off next week, she will defer the oxycodone 5 mg rx to Dr Randel Pigg office if he thinks Mr Bellucci still needs it. She has written the Rx for the long acting Xtampza.

## 2020-08-04 NOTE — Telephone Encounter (Addendum)
Mr Lasser called to get a refill on his pain medication.  By PMP he has been getting oxycodone 5 mg from orthopedics. I cautioned him about getting medications from multiple providers. He says that Dr Dagoberto Ligas is aware that it was from his knee surgery and was ok with it.  I will forward this to Dr Dagoberto Ligas. I have advised him that he may have to come pick up an Rx since we are having problems with e scribe of our prescriptions.  Please advise.

## 2020-08-04 NOTE — Telephone Encounter (Signed)
I also spoke to pt- he was not able to get my Rx for oxycodone filled by pharmacy, but they filled the Ortho surgeon's Oxycodone rx, for some reason- he was under the impression when I let him get 1 week from Ortho (which I thought I filled myself) it would mean I allowed the other doctor to continue in spite of the opiates contract he had- I explained that was a miscommunication. I also cannot put my license at risk to let pt's not follow their pain contracts multiple times- this is "1 count" against, but it's a warning- so refilled his pain meds today- as above, will defer to Dr Marlou Sa if he thinks pt still needs Oxycodone- if so, needs to get from this office.

## 2020-08-04 NOTE — Telephone Encounter (Signed)
Patient called needing Rx refilled Oxycodone. The number to contact patient is [phone]

## 2020-08-06 ENCOUNTER — Encounter: Payer: Self-pay | Admitting: Surgical

## 2020-08-06 NOTE — Progress Notes (Signed)
Patient reported to the office complaining of bilateral calf pain, particularly on the right lower extremity.  This was the nonoperative side.  He also had profuse sweating and chest pressure/pain.  He had some difficulty breathing and overall felt unwell.  Vital signs revealed blood pressure of 165/113 with a heart rate of 99.  He was afebrile at 98.4.  He had calf tenderness bilaterally on exam as well as positive Homans' sign bilaterally.  EMS was contacted and patient was transported to the ED for further evaluation of possible pulmonary embolism.

## 2020-08-07 ENCOUNTER — Other Ambulatory Visit: Payer: Self-pay

## 2020-08-07 ENCOUNTER — Telehealth: Payer: Self-pay

## 2020-08-07 ENCOUNTER — Ambulatory Visit: Payer: Medicaid Other

## 2020-08-07 ENCOUNTER — Ambulatory Visit (INDEPENDENT_AMBULATORY_CARE_PROVIDER_SITE_OTHER): Payer: Medicaid Other | Admitting: Orthopedic Surgery

## 2020-08-07 DIAGNOSIS — Z9889 Other specified postprocedural states: Secondary | ICD-10-CM

## 2020-08-07 MED ORDER — XTAMPZA ER 36 MG PO C12A
36.0000 mg | EXTENDED_RELEASE_CAPSULE | Freq: Two times a day (BID) | ORAL | 0 refills | Status: DC
Start: 2020-08-07 — End: 2020-08-17

## 2020-08-07 MED ORDER — OXYCODONE HCL 5 MG PO TABS
5.0000 mg | ORAL_TABLET | Freq: Three times a day (TID) | ORAL | 0 refills | Status: DC | PRN
Start: 2020-08-07 — End: 2020-08-17

## 2020-08-07 NOTE — Telephone Encounter (Signed)
I spoke with the patient and he agreed to come and pick up the script.  I contacted Dr. Ranell Patrick and informed.

## 2020-08-07 NOTE — Telephone Encounter (Signed)
Please adivse. thanks

## 2020-08-07 NOTE — Telephone Encounter (Signed)
Patient left a message stating that he was unable to come in Friday and pick up the printed script that Dr. Dagoberto Ligas wrote for his G[facility].  The e-scribe system was down all day and picking up a hard script was the only option.  However, that did not happen and now patient states he cannot come today because he is experiencing dizziness.  Please read telephone conversation note from Friday.   Patient is asking if we could please send his script electronically today to his pharmacy. He mentions the Xtampza and the 5 mg oxycodone that Dr. Marlou Sa provided.   Dr. Dagoberto Ligas is on PAL this week.  Please call patient to clarify  Patients phone number is [phone]

## 2020-08-07 NOTE — Telephone Encounter (Signed)
SD pt 

## 2020-08-07 NOTE — Telephone Encounter (Signed)
Patient called in wanting to ask about oxycodone , advised late note that he will be getting a call. Says he wanted to come in and see dr because he has been in pain. sch appt for today @1 :15pm

## 2020-08-07 NOTE — Telephone Encounter (Signed)
Patient is currently in office. He will be seen by Dr Dean/Luke and can discuss at that time.

## 2020-08-07 NOTE — Telephone Encounter (Signed)
Pls advise.  

## 2020-08-09 MED ORDER — ASPIRIN 81 MG PO TBEC
81.0000 mg | DELAYED_RELEASE_TABLET | Freq: Every day | ORAL | 2 refills | Status: DC
Start: 2020-08-09 — End: 2020-11-13

## 2020-08-12 ENCOUNTER — Encounter: Payer: Self-pay | Admitting: Orthopedic Surgery
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
## 2020-08-13 ENCOUNTER — Other Ambulatory Visit: Payer: Medicaid Other

## 2020-08-13 ENCOUNTER — Ambulatory Visit
Admission: RE | Admit: 2020-08-13 | Discharge: 2020-08-13 | Disposition: A | Discharge status: Home / Self Care | Payer: Medicaid Other | Source: Ambulatory Visit | Attending: Orthopedic Surgery | Admitting: Orthopedic Surgery

## 2020-08-13 ENCOUNTER — Other Ambulatory Visit: Payer: Self-pay

## 2020-08-13 ENCOUNTER — Ambulatory Visit
Admission: RE | Admit: 2020-08-13 | Discharge: 2020-08-13 | Disposition: A | Discharge status: Home / Self Care | Payer: Medicaid Other | Source: Ambulatory Visit | Attending: Surgical | Admitting: Surgical

## 2020-08-13 DIAGNOSIS — M79604 Pain in right leg: Secondary | ICD-10-CM

## 2020-08-13 DIAGNOSIS — M546 Pain in thoracic spine: Secondary | ICD-10-CM

## 2020-08-16 ENCOUNTER — Telehealth: Payer: Self-pay

## 2020-08-16 ENCOUNTER — Ambulatory Visit: Payer: Medicaid Other | Admitting: Orthopedic Surgery

## 2020-08-16 NOTE — Telephone Encounter (Signed)
Patient called saying  His daughter is running a fever , not able to make appt today , wants to know if he can do video cal with dr dean

## 2020-08-16 NOTE — Telephone Encounter (Signed)
Patient was scheduled to come in and review MRI x 2 today but could not make it due to sick daughter. Could you call patient with results?

## 2020-08-17 ENCOUNTER — Telehealth: Payer: Self-pay | Admitting: Orthopedic Surgery

## 2020-08-17 ENCOUNTER — Other Ambulatory Visit: Payer: Self-pay | Admitting: Surgical

## 2020-08-17 MED ORDER — OXYCODONE HCL 5 MG PO TABS
5.0000 mg | ORAL_TABLET | Freq: Three times a day (TID) | ORAL | 0 refills | Status: AC | PRN
Start: 2020-08-17 — End: 2020-08-25

## 2020-08-17 MED ORDER — XTAMPZA ER 36 MG PO C12A
36.0000 mg | EXTENDED_RELEASE_CAPSULE | Freq: Two times a day (BID) | ORAL | 0 refills | Status: AC
Start: 2020-08-17 — End: 2020-08-25

## 2020-08-17 NOTE — Telephone Encounter (Signed)
See below. IC let patient know that you would call him later about results.

## 2020-08-17 NOTE — Telephone Encounter (Signed)
Pt called asking for a CB with his MRI results as his daughter is sick with fever; pt states he called yesterday and didn't hear anything back. Pt would like to receive a CB as well as a refill of his oxycodone 5 mg please.   [phone]

## 2020-08-19 ENCOUNTER — Other Ambulatory Visit: Payer: Self-pay | Admitting: Physical Medicine and Rehabilitation

## 2020-08-23 ENCOUNTER — Encounter: Payer: Medicaid Other | Admitting: Physical Medicine and Rehabilitation

## 2020-08-28 ENCOUNTER — Encounter: Payer: Medicaid Other | Admitting: Physical Medicine and Rehabilitation

## 2020-08-30 ENCOUNTER — Telehealth: Payer: Self-pay

## 2020-08-30 MED ORDER — XTAMPZA ER 36 MG PO C12A
36.0000 mg | EXTENDED_RELEASE_CAPSULE | Freq: Two times a day (BID) | ORAL | 0 refills | Status: DC
Start: 2020-08-30 — End: 2020-10-02

## 2020-08-30 MED ORDER — OXYCODONE HCL 5 MG PO TABS: 5.0000 mg | ORAL_TABLET | Freq: Three times a day (TID) | ORAL | 0 refills | Status: DC | PRN

## 2020-08-30 NOTE — Telephone Encounter (Signed)
  Per pt, has finished with Ortho So needs pain meds again.   SO, wil write for Xtampza 36 mg daily #60 for 4 weeks and 15 days of oxycodone 5 mg TID prn- so should be done with oxycodone at this point.  Will see at next f/u.

## 2020-08-30 NOTE — Addendum Note (Signed)
Addended by: Courtney Heys on: 08/30/2020 12:32 PM   Modules accepted: Orders

## 2020-08-30 NOTE — Telephone Encounter (Signed)
[name] is out of pain medication. He is requesting Rx Xtampza or Oxycodone.  Per patient his pain contract with Ortho ended on 08/25/20. Please advise or refill. Thank you

## 2020-09-08 ENCOUNTER — Encounter: Payer: Self-pay | Admitting: Physical Medicine and Rehabilitation

## 2020-09-08 ENCOUNTER — Other Ambulatory Visit: Payer: Self-pay

## 2020-09-08 ENCOUNTER — Encounter: Payer: Medicaid Other | Attending: Psychology | Admitting: Physical Medicine and Rehabilitation

## 2020-09-08 DIAGNOSIS — Z79891 Long term (current) use of opiate analgesic: Secondary | ICD-10-CM | POA: Diagnosis not present

## 2020-09-08 DIAGNOSIS — M7918 Myalgia, other site: Secondary | ICD-10-CM | POA: Diagnosis not present

## 2020-09-08 DIAGNOSIS — M62838 Other muscle spasm: Secondary | ICD-10-CM | POA: Diagnosis not present

## 2020-09-08 DIAGNOSIS — S22009G Unspecified fracture of unspecified thoracic vertebra, subsequent encounter for fracture with delayed healing: Secondary | ICD-10-CM | POA: Diagnosis not present

## 2020-09-08 DIAGNOSIS — Z79899 Other long term (current) drug therapy: Secondary | ICD-10-CM | POA: Diagnosis not present

## 2020-09-08 DIAGNOSIS — M5126 Other intervertebral disc displacement, lumbar region: Secondary | ICD-10-CM | POA: Diagnosis not present

## 2020-09-08 DIAGNOSIS — G894 Chronic pain syndrome: Secondary | ICD-10-CM | POA: Diagnosis present

## 2020-09-08 DIAGNOSIS — G8922 Chronic post-thoracotomy pain: Secondary | ICD-10-CM | POA: Insufficient documentation

## 2020-09-08 DIAGNOSIS — M5412 Radiculopathy, cervical region: Secondary | ICD-10-CM | POA: Insufficient documentation
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
## 2020-09-13 ENCOUNTER — Telehealth: Payer: Self-pay

## 2020-09-13 MED ORDER — OXYCODONE HCL 5 MG PO TABS
5.0000 mg | ORAL_TABLET | Freq: Three times a day (TID) | ORAL | 0 refills | Status: DC | PRN
Start: 2020-09-13 — End: 2020-10-18

## 2020-09-13 NOTE — Telephone Encounter (Signed)
Refilled pt's Oxycodone 5 mg 3x/day #90

## 2020-09-13 NOTE — Telephone Encounter (Signed)
Refill request for Oxycodone 5mg.

## 2020-09-20 ENCOUNTER — Ambulatory Visit: Payer: Medicaid Other | Admitting: Orthopedic Surgery

## 2020-09-21 ENCOUNTER — Telehealth: Payer: Self-pay

## 2020-09-21 NOTE — Telephone Encounter (Signed)
Patient came in he missed his appointment yesterday he stated he is in pain and needs to be seen asap. His pcp faxed over a needle test for his nerves  about 2 weeks ago he also stated he had C5 done and hasn't seen the results yet. CB:[phone].

## 2020-09-22 ENCOUNTER — Ambulatory Visit: Payer: Medicaid Other | Admitting: Physical Medicine and Rehabilitation

## 2020-09-22 ENCOUNTER — Ambulatory Visit: Payer: Medicaid Other | Admitting: Orthopaedic Surgery

## 2020-09-22 NOTE — Telephone Encounter (Signed)
Appt scheduled

## 2020-09-25 ENCOUNTER — Telehealth: Payer: Self-pay

## 2020-09-25 NOTE — Telephone Encounter (Signed)
Holding for LF. Patient was worked in to schedule for this appointment.
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
## 2020-09-26 NOTE — Telephone Encounter (Signed)
Patient coming Monday.

## 2020-09-28 ENCOUNTER — Ambulatory Visit: Payer: Medicaid Other | Admitting: Orthopedic Surgery

## 2020-10-02 ENCOUNTER — Ambulatory Visit (INDEPENDENT_AMBULATORY_CARE_PROVIDER_SITE_OTHER): Payer: Medicaid Other

## 2020-10-02 ENCOUNTER — Other Ambulatory Visit: Payer: Self-pay

## 2020-10-02 ENCOUNTER — Ambulatory Visit (INDEPENDENT_AMBULATORY_CARE_PROVIDER_SITE_OTHER): Payer: Medicaid Other | Admitting: Orthopedic Surgery

## 2020-10-02 ENCOUNTER — Telehealth: Payer: Self-pay | Admitting: *Deleted

## 2020-10-02 DIAGNOSIS — M25562 Pain in left knee: Secondary | ICD-10-CM

## 2020-10-02 DIAGNOSIS — M5410 Radiculopathy, site unspecified: Secondary | ICD-10-CM

## 2020-10-02 MED ORDER — XTAMPZA ER 36 MG PO C12A
36.0000 mg | EXTENDED_RELEASE_CAPSULE | Freq: Two times a day (BID) | ORAL | 0 refills | Status: DC
Start: 2020-10-02 — End: 2020-11-02

## 2020-10-02 NOTE — Telephone Encounter (Signed)
[name] called to request a refill on his Xtampza 36 mg.  Per the PMP his last refill was 08/30/20.  His next appt with Dr Dagoberto Ligas is 11/08/20.

## 2020-10-02 NOTE — Telephone Encounter (Signed)
Refilled 12/13 #60 36 mg Xtampza

## 2020-10-03 ENCOUNTER — Encounter: Payer: Self-pay | Admitting: Orthopedic Surgery

## 2020-10-03 ENCOUNTER — Telehealth: Payer: Self-pay | Admitting: Orthopedic Surgery

## 2020-10-03 NOTE — Telephone Encounter (Signed)
See below about MRI I still have not seen EMG/NCV results.

## 2020-10-03 NOTE — Telephone Encounter (Signed)
Patient notified

## 2020-10-03 NOTE — Telephone Encounter (Signed)
Patient called requesting a call back from Dr. Marlou Sa or Rogelia Boga. Patient is asking if his left hip and neck can be included in his MRI. Patient states Dr. Marlou Sa was is is suppose to receive results from needle test done at patient PCP. Please call patient for more information if needed at  [phone].
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
## 2020-10-03 NOTE — Telephone Encounter (Signed)
No but we are doing c spine as discussed for 15 min yesterday

## 2020-10-04 ENCOUNTER — Telehealth: Payer: Self-pay | Admitting: Orthopedic Surgery

## 2020-10-04 NOTE — Telephone Encounter (Signed)
Pt needs to be contacted about MRI imaging for his hip and neck.

## 2020-10-04 NOTE — Telephone Encounter (Signed)
I have talked with patient about this. See previous note for further documentation

## 2020-10-04 NOTE — Telephone Encounter (Signed)
IC s/w patient and advised  

## 2020-10-06 ENCOUNTER — Other Ambulatory Visit: Payer: Self-pay | Admitting: Physical Medicine and Rehabilitation

## 2020-10-15 ENCOUNTER — Other Ambulatory Visit: Payer: Self-pay | Admitting: Surgical

## 2020-10-16 NOTE — Telephone Encounter (Signed)
Could you please advise since Luke/Dr August Saucer are out of the office?

## 2020-10-17 ENCOUNTER — Telehealth: Payer: Self-pay | Admitting: *Deleted

## 2020-10-17 NOTE — Telephone Encounter (Signed)
[name] called for a refill on his oxycodone 5 mg.  Per the PMP it was last refilled 09/13/20.

## 2020-10-18 MED ORDER — OXYCODONE HCL 5 MG PO TABS
5.0000 mg | ORAL_TABLET | Freq: Three times a day (TID) | ORAL | 0 refills | Status: DC | PRN
Start: 2020-10-18 — End: 2020-11-28

## 2020-10-18 NOTE — Telephone Encounter (Signed)
Notified [name] his medication was sent to the pharmacy.

## 2020-10-29 ENCOUNTER — Other Ambulatory Visit: Payer: Medicaid Other

## 2020-11-02 ENCOUNTER — Other Ambulatory Visit: Payer: Self-pay

## 2020-11-02 MED ORDER — XTAMPZA ER 36 MG PO C12A
36.0000 mg | EXTENDED_RELEASE_CAPSULE | Freq: Two times a day (BID) | ORAL | 0 refills | Status: DC
Start: 2020-11-02 — End: 2020-11-28

## 2020-11-05 ENCOUNTER — Inpatient Hospital Stay: Admission: RE | Admit: 2020-11-05 | Payer: Medicaid Other | Source: Ambulatory Visit

## 2020-11-05 ENCOUNTER — Other Ambulatory Visit: Payer: Medicaid Other

## 2020-11-08 ENCOUNTER — Encounter: Payer: Medicaid Other | Admitting: Physical Medicine and Rehabilitation

## 2020-11-10 ENCOUNTER — Other Ambulatory Visit: Payer: Self-pay

## 2020-11-10 ENCOUNTER — Ambulatory Visit
Admission: RE | Admit: 2020-11-10 | Discharge: 2020-11-10 | Disposition: A | Discharge status: Home / Self Care | Payer: Medicaid Other | Source: Ambulatory Visit | Attending: Orthopedic Surgery | Admitting: Orthopedic Surgery

## 2020-11-10 DIAGNOSIS — M5410 Radiculopathy, site unspecified: Secondary | ICD-10-CM

## 2020-11-10 DIAGNOSIS — M25562 Pain in left knee: Secondary | ICD-10-CM

## 2020-11-12 ENCOUNTER — Other Ambulatory Visit: Payer: Self-pay | Admitting: Physician Assistant

## 2020-11-12 DIAGNOSIS — F419 Anxiety disorder, unspecified: Secondary | ICD-10-CM

## 2020-11-13 ENCOUNTER — Other Ambulatory Visit: Payer: Self-pay | Admitting: Surgical

## 2020-11-13 NOTE — Telephone Encounter (Signed)
Will send in one last RX but I would recommend he seek further refills from his PCP

## 2020-11-13 NOTE — Telephone Encounter (Signed)
Please advise. Thanks.  

## 2020-11-18 ENCOUNTER — Other Ambulatory Visit: Payer: Medicaid Other

## 2020-11-20 ENCOUNTER — Encounter: Payer: Self-pay | Admitting: Orthopedic Surgery

## 2020-11-20 ENCOUNTER — Ambulatory Visit (INDEPENDENT_AMBULATORY_CARE_PROVIDER_SITE_OTHER): Payer: Medicaid Other | Admitting: Orthopedic Surgery

## 2020-11-20 DIAGNOSIS — M5410 Radiculopathy, site unspecified: Secondary | ICD-10-CM

## 2020-11-23 ENCOUNTER — Encounter: Payer: Self-pay | Admitting: Orthopedic Surgery
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
## 2020-11-28 ENCOUNTER — Telehealth: Payer: Self-pay | Admitting: *Deleted

## 2020-11-28 ENCOUNTER — Telehealth: Payer: Self-pay | Admitting: Orthopedic Surgery

## 2020-11-28 MED ORDER — XTAMPZA ER 36 MG PO C12A: 36.0000 mg | EXTENDED_RELEASE_CAPSULE | Freq: Two times a day (BID) | ORAL | 0 refills | Status: DC

## 2020-11-28 MED ORDER — OXYCODONE HCL 5 MG PO TABS
5.0000 mg | ORAL_TABLET | Freq: Three times a day (TID) | ORAL | 0 refills | Status: DC | PRN
Start: 2020-11-28 — End: 2020-12-29

## 2020-11-28 NOTE — Telephone Encounter (Signed)
refilled Oxycodone- last filled 12/30- and Xtampza also almost due- last filled 1/13- so due 1/10 and today is 1/8- filled both.

## 2020-11-28 NOTE — Telephone Encounter (Signed)
IC discussed. Scheduled appt with Lurena Joiner for Thursday.

## 2020-11-28 NOTE — Telephone Encounter (Signed)
Mr Saputo called for a refill on his oxycodone 5 mg tablets.  Per the PMP his last fill date was 10/19/20 for the oxycodone 5 mg and his xtampza was filled 11/02/20.  His next appt is 12/08/20 with Dr Dagoberto Ligas. He did not say xtampza but by pmp fill date he would need that before 12/08/20 as well.

## 2020-11-28 NOTE — Telephone Encounter (Signed)
Pt called and said that he really needs a call back. He is in a lot of pain right now.He doesn't think its his banck he thinks it his knee. CB [phone]

## 2020-11-28 NOTE — Telephone Encounter (Signed)
Notified. 

## 2020-11-30 ENCOUNTER — Ambulatory Visit: Payer: Medicaid Other | Admitting: Surgical

## 2020-12-01 ENCOUNTER — Ambulatory Visit (INDEPENDENT_AMBULATORY_CARE_PROVIDER_SITE_OTHER): Payer: Medicaid Other | Admitting: Surgical

## 2020-12-01 ENCOUNTER — Other Ambulatory Visit: Payer: Self-pay

## 2020-12-01 ENCOUNTER — Encounter: Payer: Self-pay | Admitting: Surgical

## 2020-12-01 DIAGNOSIS — M542 Cervicalgia: Secondary | ICD-10-CM | POA: Diagnosis not present

## 2020-12-01 DIAGNOSIS — M25562 Pain in left knee: Secondary | ICD-10-CM | POA: Diagnosis not present

## 2020-12-01 DIAGNOSIS — M5410 Radiculopathy, site unspecified: Secondary | ICD-10-CM

## 2020-12-02 ENCOUNTER — Other Ambulatory Visit: Payer: Self-pay | Admitting: Physical Medicine and Rehabilitation

## 2020-12-02 ENCOUNTER — Encounter: Payer: Self-pay | Admitting: Surgical
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
## 2020-12-08 ENCOUNTER — Encounter: Payer: Medicaid Other | Admitting: Physical Medicine and Rehabilitation

## 2020-12-12 ENCOUNTER — Ambulatory Visit: Payer: Medicaid Other | Admitting: Physical Therapy

## 2020-12-13 ENCOUNTER — Ambulatory Visit: Payer: Medicaid Other | Attending: Surgical

## 2020-12-21 ENCOUNTER — Ambulatory Visit: Payer: Medicaid Other | Admitting: Physical Therapy

## 2020-12-29 ENCOUNTER — Encounter: Payer: Self-pay | Admitting: Physical Medicine and Rehabilitation

## 2020-12-29 ENCOUNTER — Other Ambulatory Visit: Payer: Self-pay

## 2020-12-29 ENCOUNTER — Encounter
Payer: Medicaid Other | Attending: Physical Medicine and Rehabilitation | Admitting: Physical Medicine and Rehabilitation

## 2020-12-29 VITALS — BP 146/91 | HR 104 | Temp 99.1°F | Ht 73.0 in | Wt 315.6 lb

## 2020-12-29 DIAGNOSIS — Z5181 Encounter for therapeutic drug level monitoring: Secondary | ICD-10-CM | POA: Diagnosis present

## 2020-12-29 DIAGNOSIS — M62838 Other muscle spasm: Secondary | ICD-10-CM | POA: Insufficient documentation

## 2020-12-29 DIAGNOSIS — Z79891 Long term (current) use of opiate analgesic: Secondary | ICD-10-CM | POA: Insufficient documentation

## 2020-12-29 DIAGNOSIS — M7918 Myalgia, other site: Secondary | ICD-10-CM | POA: Insufficient documentation

## 2020-12-29 DIAGNOSIS — G894 Chronic pain syndrome: Secondary | ICD-10-CM | POA: Diagnosis not present

## 2020-12-29 DIAGNOSIS — G8922 Chronic post-thoracotomy pain: Secondary | ICD-10-CM | POA: Diagnosis not present

## 2020-12-29 DIAGNOSIS — M792 Neuralgia and neuritis, unspecified: Secondary | ICD-10-CM | POA: Diagnosis present

## 2020-12-29 MED ORDER — GABAPENTIN 300 MG PO CAPS: ORAL_CAPSULE | ORAL | 5 refills | Status: DC

## 2020-12-29 MED ORDER — OXYCODONE HCL 5 MG PO TABS: 5.0000 mg | ORAL_TABLET | Freq: Three times a day (TID) | ORAL | 0 refills | Status: DC | PRN

## 2020-12-29 MED ORDER — LIDOCAINE 5 % EX PTCH: 3.0000 | MEDICATED_PATCH | CUTANEOUS | 5 refills | Status: DC

## 2020-12-29 MED ORDER — XTAMPZA ER 36 MG PO C12A: 36.0000 mg | EXTENDED_RELEASE_CAPSULE | Freq: Two times a day (BID) | ORAL | 0 refills | Status: DC

## 2020-12-29 MED ORDER — METAXALONE 800 MG PO TABS: 800.0000 mg | ORAL_TABLET | Freq: Three times a day (TID) | ORAL | 5 refills | Status: DC
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
## 2021-01-10 ENCOUNTER — Telehealth: Payer: Self-pay | Admitting: Physical Medicine and Rehabilitation

## 2021-01-10 NOTE — Telephone Encounter (Signed)
Patient called requesting to reschedule appt. Patient states to be sick. Please call patient at [phone].

## 2021-01-10 NOTE — Telephone Encounter (Signed)
Left message #1

## 2021-01-11 ENCOUNTER — Ambulatory Visit: Payer: Medicaid Other | Admitting: Physical Medicine and Rehabilitation

## 2021-01-15 ENCOUNTER — Telehealth: Payer: Self-pay | Admitting: Physical Medicine and Rehabilitation

## 2021-01-15 NOTE — Telephone Encounter (Signed)
Patient called. He would like a call to reschedule with Dr. Ernestina Patches.

## 2021-01-16 ENCOUNTER — Other Ambulatory Visit: Payer: Self-pay | Admitting: Physician Assistant

## 2021-01-16 NOTE — Telephone Encounter (Signed)
See previous message

## 2021-01-16 NOTE — Telephone Encounter (Signed)
Rescheduled

## 2021-01-29 ENCOUNTER — Encounter: Payer: Self-pay | Admitting: Physical Medicine and Rehabilitation

## 2021-01-29 ENCOUNTER — Ambulatory Visit: Payer: Self-pay

## 2021-01-29 ENCOUNTER — Telehealth: Payer: Self-pay | Admitting: *Deleted

## 2021-01-29 ENCOUNTER — Ambulatory Visit (INDEPENDENT_AMBULATORY_CARE_PROVIDER_SITE_OTHER): Payer: Medicaid Other | Admitting: Physical Medicine and Rehabilitation

## 2021-01-29 ENCOUNTER — Other Ambulatory Visit: Payer: Self-pay

## 2021-01-29 VITALS — BP 144/102 | HR 105

## 2021-01-29 DIAGNOSIS — G8929 Other chronic pain: Secondary | ICD-10-CM

## 2021-01-29 DIAGNOSIS — M5416 Radiculopathy, lumbar region: Secondary | ICD-10-CM | POA: Diagnosis not present

## 2021-01-29 DIAGNOSIS — M5116 Intervertebral disc disorders with radiculopathy, lumbar region: Secondary | ICD-10-CM | POA: Diagnosis not present

## 2021-01-29 DIAGNOSIS — M898X1 Other specified disorders of bone, shoulder: Secondary | ICD-10-CM

## 2021-01-29 DIAGNOSIS — M542 Cervicalgia: Secondary | ICD-10-CM

## 2021-01-29 DIAGNOSIS — M4802 Spinal stenosis, cervical region: Secondary | ICD-10-CM

## 2021-01-29 MED ORDER — METHYLPREDNISOLONE ACETATE 80 MG/ML IJ SUSP: 40.0000 mg | Freq: Once | INTRAMUSCULAR | Status: DC
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
## 2021-01-29 NOTE — Telephone Encounter (Signed)
Prior auth for [employer] was initiated and approved via CoverMyMeds. [place] Case: [id number], Status: Approved, Coverage Starts on: 01/08/2021 12:00:00 AM, Coverage Ends on: 04/08/2021 12:00:00 AM.

## 2021-01-29 NOTE — Patient Instructions (Signed)

## 2021-01-29 NOTE — Progress Notes (Signed)
Pt state neck and both shoulder blades. Pt state bend or looking down makes the pain worse. Pt state he takes pain meds to help ease his pain  Numeric Pain Rating Scale and Functional Assessment Average Pain 9 Pain Right Now 9 My pain is constant, sharp, burning, dull, stabbing, tingling and aching Pain is worse with: bending and some activites Pain improves with: medication   In the last MONTH (on 0-10 scale) has pain interfered with the following?  1. General activity like being  able to carry out your everyday physical activities such as walking, climbing stairs, carrying groceries, or moving a chair?  Rating(9)  2. Relation with others like being able to carry out your usual social activities and roles such as  activities at home, at work and in your community. Rating(9)  3. Enjoyment of life such that you have  been bothered by emotional problems such as feeling anxious, depressed or irritable?  Rating(9)  Pt state lower back pain that travels down the left legs. Pt state walking, standing and sitting makes the pain worse. Pt state he take pain meds to help ease his pain.  Numeric Pain Rating Scale and Functional Assessment Average Pain 9   In the last MONTH (on 0-10 scale) has pain interfered with the following?  1. General activity like being  able to carry out your everyday physical activities such as walking, climbing stairs, carrying groceries, or moving a chair?  Rating(9)   +Driver, -BT, -Dye Allergies.

## 2021-01-30 ENCOUNTER — Telehealth: Payer: Self-pay | Admitting: Physical Medicine and Rehabilitation

## 2021-01-30 ENCOUNTER — Telehealth: Payer: Self-pay

## 2021-01-30 NOTE — Telephone Encounter (Signed)
I have spoken with pt

## 2021-01-30 NOTE — Telephone Encounter (Signed)
Pt note needs to be complete

## 2021-01-30 NOTE — Telephone Encounter (Signed)
Patient called asked for a call back as soon as possible. Patient said Dr. Ernestina Patches wanted him on schedule for an appointment to get an injection as soon as he can get on the schedule. Patient said he is ina great deal of pain.  Patient asked if he can get a call back today if possible. The number to contact patient is [phone]

## 2021-01-31 ENCOUNTER — Other Ambulatory Visit: Payer: Self-pay

## 2021-01-31 ENCOUNTER — Encounter
Payer: Medicaid Other | Attending: Physical Medicine and Rehabilitation | Admitting: Physical Medicine and Rehabilitation

## 2021-01-31 ENCOUNTER — Encounter: Payer: Self-pay | Admitting: Physical Medicine and Rehabilitation

## 2021-01-31 VITALS — BP 133/96 | HR 113 | Temp 99.0°F | Ht 73.0 in | Wt 312.8 lb

## 2021-01-31 DIAGNOSIS — G8922 Chronic post-thoracotomy pain: Secondary | ICD-10-CM | POA: Diagnosis not present

## 2021-01-31 DIAGNOSIS — G8912 Acute post-thoracotomy pain: Secondary | ICD-10-CM | POA: Diagnosis present

## 2021-01-31 DIAGNOSIS — M7918 Myalgia, other site: Secondary | ICD-10-CM | POA: Insufficient documentation

## 2021-01-31 DIAGNOSIS — F419 Anxiety disorder, unspecified: Secondary | ICD-10-CM | POA: Insufficient documentation

## 2021-01-31 DIAGNOSIS — G894 Chronic pain syndrome: Secondary | ICD-10-CM | POA: Diagnosis present

## 2021-01-31 DIAGNOSIS — Z5181 Encounter for therapeutic drug level monitoring: Secondary | ICD-10-CM | POA: Insufficient documentation

## 2021-01-31 DIAGNOSIS — Z79891 Long term (current) use of opiate analgesic: Secondary | ICD-10-CM | POA: Insufficient documentation

## 2021-01-31 DIAGNOSIS — F32A Depression, unspecified: Secondary | ICD-10-CM | POA: Diagnosis present

## 2021-01-31 DIAGNOSIS — M792 Neuralgia and neuritis, unspecified: Secondary | ICD-10-CM | POA: Insufficient documentation

## 2021-01-31 MED ORDER — OXYCODONE HCL 5 MG PO TABS: 5.0000 mg | ORAL_TABLET | Freq: Three times a day (TID) | ORAL | 0 refills | Status: DC | PRN

## 2021-01-31 MED ORDER — PROMETHAZINE HCL 25 MG PO TABS: 25.0000 mg | ORAL_TABLET | Freq: Four times a day (QID) | ORAL | 11 refills | Status: DC | PRN

## 2021-01-31 MED ORDER — XTAMPZA ER 36 MG PO C12A: 36.0000 mg | EXTENDED_RELEASE_CAPSULE | Freq: Two times a day (BID) | ORAL | 0 refills | Status: DC

## 2021-01-31 NOTE — Procedures (Signed)
Lumbosacral Transforaminal Epidural Steroid Injection - Sub-Pedicular Approach with Fluoroscopic Guidance  Patient: [name]      Date of Birth: 02-Mar-1976 MRN: [id number] PCP: Imagene Riches, NP      Visit Date: 01/29/2021   Universal Protocol:    Date/Time: 01/29/2021  Consent Given By: the patient  Position: PRONE  Additional Comments: Vital signs were monitored before and after the procedure. Patient was prepped and draped in the usual sterile fashion. The correct patient, procedure, and site was verified.   Injection Procedure Details:   Procedure diagnoses: Lumbar radiculopathy [M54.16]    Meds Administered:  Meds ordered this encounter  Medications  . methylPREDNISolone acetate (DEPO-MEDROL) injection 40 mg    Laterality: Left  Location/Site:  L4-L5  Needle:5.0 in., 22 ga.  Short bevel or Quincke spinal needle  Needle Placement: Transforaminal  Findings:    -Comments: Excellent flow of contrast along the nerve, nerve root and into the epidural space.  Pain significantly out of proportion even with just numbing the skin and needling into the muscle.  Procedure Details: After squaring off the end-plates to get a true AP view, the C-arm was positioned so that an oblique view of the foramen as noted above was visualized. The target area is just inferior to the "nose of the scotty dog" or sub pedicular. The soft tissues overlying this structure were infiltrated with 2-3 ml. of 1% Lidocaine without Epinephrine.  The spinal needle was inserted toward the target using a "trajectory" view along the fluoroscope beam.  Under AP and lateral visualization, the needle was advanced so it did not puncture dura and was located close the 6 O'Clock position of the pedical in AP tracterory. Biplanar projections were used to confirm position. Aspiration was confirmed to be negative for CSF and/or blood. A 1-2 ml. volume of Isovue-250 was injected and flow of contrast was noted at  each level. Radiographs were obtained for documentation purposes.   After attaining the desired flow of contrast documented above, a 0.5 to 1.0 ml test dose of 0.25% Marcaine was injected into each respective transforaminal space.  The patient was observed for 90 seconds post injection.  After no sensory deficits were reported, and normal lower extremity motor function was noted,   the above injectate was administered so that equal amounts of the injectate were placed at each foramen (level) into the transforaminal epidural space.   Additional Comments:  No complications occurred Dressing: 2 x 2 sterile gauze and Band-Aid    Post-procedure details: Patient was observed during the procedure. Post-procedure instructions were reviewed.  Patient left the clinic in stable condition.
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
## 2021-01-31 NOTE — Patient Instructions (Signed)
Plan 1. Discussed  Spinal/cervical injections Also spinal cord stimulator- and how that might also affect pain positively help pain.   2. PT evaluation for TENS unit- for myofascial release, HEP and TENs unit evaluation.    3. Talk to Dr Ernestina Patches about spinal cord stimulator as well as cervical injections. I agree cervical injection /neck injection would be appropriate.    4. Will refill Xtampza 36 mg BID and Oxycodone 5 mg TID prn #90  5. Phenergan 12.5 mg q6 hours prn # 90- 11 refills  6. Patient here for trigger point injections for  Consent done and on chart.  Cleaned areas with alcohol and injected using a 27 gauge 1.5 inch needle  Injected  6cc Using 1% Lidocaine with no EPI  Upper traps B/L  Levators B/L  Posterior scalenes B/L  Middle scalenes Splenius Capitus B/L  Pectoralis Major B/L  Rhomboids B/L x2 Infraspinatus Teres Major/minor B/L  Thoracic paraspinals Lumbar paraspinals Other injections- underneath arms near thoracotomy scar on L and now did on R    Patient's level of pain prior was 8/10 Current level of pain after injections is 810  There was no bleeding or complications.  Patient was advised to drink a lot of water on day after injections to flush system Will have increased soreness for 12-48 hours after injections.  Can use Lidocaine patches the day AFTER injections Can use theracane on day of injections in places didn't inject Can use heating pad 4-6 hours AFTER injections   7. F/U in 6 weeks for possible trigger point injections.

## 2021-01-31 NOTE — Telephone Encounter (Signed)
done

## 2021-02-05 NOTE — Telephone Encounter (Signed)
Pt was approve and sch 4/28

## 2021-02-06 ENCOUNTER — Other Ambulatory Visit: Payer: Self-pay | Admitting: Physical Medicine and Rehabilitation

## 2021-02-12 ENCOUNTER — Telehealth: Payer: Self-pay | Admitting: Physical Medicine and Rehabilitation

## 2021-02-12 NOTE — Telephone Encounter (Signed)
Patient called requesting a call back. Patient states since yesterday his right arm from middle finger down to pinky has been throbbing and numbness. Patient phone number [phone]

## 2021-02-13 NOTE — Telephone Encounter (Signed)
Called pt and LVM #1 

## 2021-02-14 ENCOUNTER — Telehealth: Payer: Self-pay | Admitting: *Deleted

## 2021-02-14 ENCOUNTER — Telehealth: Payer: Self-pay

## 2021-02-14 NOTE — Telephone Encounter (Signed)
Please schedule him a follow up appt with Dr Marlou Sa to discuss concerns

## 2021-02-14 NOTE — Telephone Encounter (Signed)
Right hand from middle finger to his pinky feels like its numb and froze and there is pain and tingling he stated the same thing is happening with his pointer finger and his thumb on his left hand he stated he can feel the pain starting to shoot up his left side/arm and also his lower back where he received the last injection he stated the injection did not help, patient also stated he is having knee pain in the left knee call back:[phone]

## 2021-02-14 NOTE — Telephone Encounter (Signed)
Prior auth submitted to [facility] via CoverMyMeds for oxycodone 5 mg tablets #90. PA Case: [id number], Status: Approved, Coverage Starts on: 02/01/2021 12:00:00 AM, Coverage Ends on: 07/31/2021 12:00:00 AM.

## 2021-02-15 ENCOUNTER — Encounter: Payer: Self-pay | Admitting: Physical Medicine and Rehabilitation

## 2021-02-15 ENCOUNTER — Ambulatory Visit (INDEPENDENT_AMBULATORY_CARE_PROVIDER_SITE_OTHER): Payer: Medicaid Other | Admitting: Physical Medicine and Rehabilitation

## 2021-02-15 ENCOUNTER — Other Ambulatory Visit: Payer: Self-pay

## 2021-02-15 ENCOUNTER — Ambulatory Visit: Payer: Self-pay

## 2021-02-15 VITALS — BP 131/87 | HR 87

## 2021-02-15 DIAGNOSIS — M5412 Radiculopathy, cervical region: Secondary | ICD-10-CM

## 2021-02-15 MED ORDER — BETAMETHASONE SOD PHOS & ACET 6 (3-3) MG/ML IJ SUSP
12.0000 mg | Freq: Once | INTRAMUSCULAR | Status: AC
  Administered 2021-02-15: 12 mg

## 2021-02-15 NOTE — Progress Notes (Signed)
Pt state neck pain that travels down his right to the hand. Pt state he makes numbness between the middle finger to the pinky. Pt state everything makes it hurt. Pt state he takes pain meds to help ease his pain. Pt state his last inj didn't help.  Numeric Pain Rating Scale and Functional Assessment Average Pain 9   In the last MONTH (on 0-10 scale) has pain interfered with the following?  1. General activity like being  able to carry out your everyday physical activities such as walking, climbing stairs, carrying groceries, or moving a chair?  Rating(9)   +Driver, -BT, -Dye Allergies.

## 2021-02-15 NOTE — Progress Notes (Signed)
Joenathan Sakuma - 45 y.o. male MRN [id number]  Date of birth: [birth year]  Office Visit Note: Visit Date: 02/15/2021 PCP: Imagene Riches, NP Referred by: Imagene Riches, NP  Subjective: Chief Complaint  Patient presents with  . Neck - Pain  . Right Arm - Pain  . Right Hand - Numbness, Pain   HPI:  Keyen Marban is a 45 y.o. male who comes in today at the request of Dr. Anderson Malta for planned Right C7-T1 Cervical epidural steroid injection with fluoroscopic guidance.  The patient has failed conservative care including home exercise, medications, time and activity modification.  This injection will be diagnostic and hopefully therapeutic.  Please see requesting physician notes for further details and justification. MRI reviewed with images and spine model.  MRI reviewed in the note below.  Just as a follow-up from the prior left L4 transforaminal epidural steroid injection.  The patient has had no relief from that injection.  I did review the images and that looks like a very good injection at that level.  His MRI was not very impressive from a standpoint of no focal nerve compression there were some small disc protrusions.  He will follow-up with Dr. Marlou Sa concerning neck step.  Would not recommend further injection at that point as he got no relief at all.  In terms of his neck and shoulder blade pain he now complains of some numbness and tingling in the right hand.  It is somewhat nondermatomal and that it is the last 3 digits or the ulnar 3 digits which does cross over dermatomal and peripheral nerve distribution.  Depending on relief with epidural injection could consider referral to neurology for electrodiagnostic study.   ROS Otherwise per HPI.  Assessment & Plan: Visit Diagnoses:    ICD-10-CM   1. Cervical radiculopathy  M54.12 XR C-ARM NO REPORT    Epidural Steroid injection    betamethasone acetate-betamethasone sodium phosphate (CELESTONE) injection 12 mg    Plan: No additional  findings.   Meds & Orders:  Meds ordered this encounter  Medications  . betamethasone acetate-betamethasone sodium phosphate (CELESTONE) injection 12 mg    Orders Placed This Encounter  Procedures  . XR C-ARM NO REPORT  . Epidural Steroid injection    Follow-up: Return in about 2 weeks (around 03/01/2021) for  Anderson Malta, MD.   Procedures: No procedures performed  Cervical Epidural Steroid Injection - Interlaminar Approach with Fluoroscopic Guidance  Patient: Elya Diloreto      Date of Birth: May 14, 1976 MRN: [id number] PCP: Imagene Riches, NP      Visit Date: 02/15/2021   Universal Protocol:    Date/Time: 02/16/2211:58 PM  Consent Given By: the patient  Position: PRONE  Additional Comments: Vital signs were monitored before and after the procedure. Patient was prepped and draped in the usual sterile fashion. The correct patient, procedure, and site was verified.   Injection Procedure Details:   Procedure diagnoses: Cervical radiculopathy [M54.12]    Meds Administered:  Meds ordered this encounter  Medications  . betamethasone acetate-betamethasone sodium phosphate (CELESTONE) injection 12 mg     Laterality: Right  Location/Site: C7-T1  Needle: 3.5 in., 20 ga. Tuohy  Needle Placement: Paramedian epidural space  Findings:  -Comments: Excellent flow of contrast into the epidural space.  Procedure Details: Using a paramedian approach from the side mentioned above, the region overlying the inferior lamina was localized under fluoroscopic visualization and the soft tissues overlying this structure were infiltrated with 4  ml. of 1% Lidocaine without Epinephrine. A # 20 gauge, Tuohy needle was inserted into the epidural space using a paramedian approach.  The epidural space was localized using loss of resistance along with contralateral oblique bi-planar fluoroscopic views.  After negative aspirate for air, blood, and CSF, a 2 ml. volume of Isovue-250 was injected  into the epidural space and the flow of contrast was observed. Radiographs were obtained for documentation purposes.   The injectate was administered into the level noted above.  Additional Comments:  The patient tolerated the procedure well Dressing: 2 x 2 sterile gauze and Band-Aid    Post-procedure details: Patient was observed during the procedure. Post-procedure instructions were reviewed.  Patient left the clinic in stable condition.     Clinical History: MRI CERVICAL SPINE WITHOUT CONTRAST  TECHNIQUE: Multiplanar, multisequence MR imaging of the cervical spine was performed. No intravenous contrast was administered.  COMPARISON: None.  FINDINGS: Please note image quality is mildly degraded by motion artifact.  Alignment: Normal.  Vertebrae: Normal bone marrow signal intensity. No focal osseous lesion.  Cord: Mild heterogeneity of the cord secondary to motion artifact. No definite focal lesion.  Posterior Fossa, vertebral arteries: Negative.  Disc levels: Mild multilevel desiccation.  C2-3: No significant disc bulge. Patent spinal canal and neural foramen.  C3-4: Left predominant uncovertebral and facet degenerative spurring. Patent spinal canal and right neural foramen. Mild left neural foraminal narrowing.  C4-5: Uncovertebral and facet degenerative spurring. Patent spinal canal and right neural foramen. Mild left neural foraminal narrowing.  C5-6: No significant disc bulge. Left predominant uncovertebral and facet degenerative spurring. Patent spinal canal and right neural foramen. Mild left neural foraminal narrowing.  C6-7: No significant disc bulge. Patent spinal canal and neural foramen.  C7-T1: No significant disc bulge. Patent spinal canal and neural foramen.  Paraspinal tissues: Negative.  IMPRESSION: Mild spondylosis. No significant spinal canal narrowing.  Mild left neural foraminal narrowing at the C3-C6  levels.   Electronically Signed By: Primitivo Gauze M.D. On: 11/10/2020 19:44 ---- MRI THORACIC AND LUMBAR SPINE WITHOUT CONTRAST  TECHNIQUE: Multiplanar and multiecho pulse sequences of the thoracic and lumbar spine were obtained without intravenous contrast.  COMPARISON:  None.  FINDINGS: MRI THORACIC SPINE FINDINGS  Alignment:  Physiologic.  Vertebrae: No fracture, evidence of discitis, or bone lesion.  Cord:  Normal signal and morphology.  Paraspinal and other soft tissues: Negative.  Disc levels:  No disc herniation or stenosis  MRI LUMBAR SPINE FINDINGS  Segmentation:  Standard  Alignment:  Physiologic.  Vertebrae:  No fracture, evidence of discitis, or bone lesion.  Conus medullaris and cauda equina: Conus extends to the T12-L1 level. Conus and cauda equina appear normal.  Paraspinal and other soft tissues: Negative  Disc levels:  With the levels above L4 are normal.  L4-5: Small central disc protrusion. No spinal canal or neural foraminal stenosis.  L5-S1: Small central disc protrusion. No spinal canal or neural foraminal stenosis.  IMPRESSION: Small central disc protrusions at L4-5 and L5-S1 without spinal canal or neural foraminal stenosis. Otherwise normal thoracic and lumbar spine.   Electronically Signed   By: Ulyses Jarred M.D.   On: 08/14/2020 00:01     Objective:  VS:  HT:    WT:   BMI:     BP:131/87  HR:87bpm  TEMP: ( )  RESP:  Physical Exam Vitals and nursing note reviewed.  Constitutional:      General: He is not in acute distress.    Appearance: Normal  appearance. He is not ill-appearing.  HENT:     Head: Normocephalic and atraumatic.     Right Ear: External ear normal.     Left Ear: External ear normal.  Eyes:     Extraocular Movements: Extraocular movements intact.  Cardiovascular:     Rate and Rhythm: Normal rate.     Pulses: Normal pulses.  Abdominal:     General: There is no  distension.     Palpations: Abdomen is soft.  Musculoskeletal:        General: No signs of injury.     Cervical back: Neck supple. Tenderness present. No rigidity.     Right lower leg: No edema.     Left lower leg: No edema.     Comments: Patient has good strength in the upper extremities with 5 out of 5 strength in wrist extension long finger flexion APB.  No intrinsic hand muscle atrophy.  Negative Hoffmann's test.  Lymphadenopathy:     Cervical: No cervical adenopathy.  Skin:    Findings: No erythema or rash.  Neurological:     General: No focal deficit present.     Mental Status: He is alert and oriented to person, place, and time.     Sensory: No sensory deficit.     Motor: No weakness or abnormal muscle tone.     Coordination: Coordination normal.  Psychiatric:        Mood and Affect: Mood normal.        Behavior: Behavior normal.      Imaging: No results found.

## 2021-02-15 NOTE — Procedures (Signed)
Cervical Epidural Steroid Injection - Interlaminar Approach with Fluoroscopic Guidance  Patient: [name]      Date of Birth: 1976/08/02 MRN: [id number] PCP: Imagene Riches, NP      Visit Date: 02/15/2021   Universal Protocol:    Date/Time: 02/16/2211:58 PM  Consent Given By: the patient  Position: PRONE  Additional Comments: Vital signs were monitored before and after the procedure. Patient was prepped and draped in the usual sterile fashion. The correct patient, procedure, and site was verified.   Injection Procedure Details:   Procedure diagnoses: Cervical radiculopathy [M54.12]    Meds Administered:  Meds ordered this encounter  Medications  . betamethasone acetate-betamethasone sodium phosphate (CELESTONE) injection 12 mg     Laterality: Right  Location/Site: C7-T1  Needle: 3.5 in., 20 ga. Tuohy  Needle Placement: Paramedian epidural space  Findings:  -Comments: Excellent flow of contrast into the epidural space.  Procedure Details: Using a paramedian approach from the side mentioned above, the region overlying the inferior lamina was localized under fluoroscopic visualization and the soft tissues overlying this structure were infiltrated with 4 ml. of 1% Lidocaine without Epinephrine. A # 20 gauge, Tuohy needle was inserted into the epidural space using a paramedian approach.  The epidural space was localized using loss of resistance along with contralateral oblique bi-planar fluoroscopic views.  After negative aspirate for air, blood, and CSF, a 2 ml. volume of Isovue-250 was injected into the epidural space and the flow of contrast was observed. Radiographs were obtained for documentation purposes.   The injectate was administered into the level noted above.  Additional Comments:  The patient tolerated the procedure well Dressing: 2 x 2 sterile gauze and Band-Aid    Post-procedure details: Patient was observed during the procedure. Post-procedure  instructions were reviewed.  Patient left the clinic in stable condition.

## 2021-02-15 NOTE — Patient Instructions (Signed)

## 2021-02-16 ENCOUNTER — Ambulatory Visit: Payer: Medicaid Other | Admitting: Physical Medicine and Rehabilitation

## 2021-02-20 ENCOUNTER — Other Ambulatory Visit: Payer: Self-pay | Admitting: Surgical

## 2021-02-20 NOTE — Telephone Encounter (Signed)
Pls advise.  

## 2021-02-20 NOTE — Telephone Encounter (Signed)
Doesn't need it but I can refill it if he wants to continue. Would recommend discussing it with his PCP if they think he would benefit from continuing ASA

## 2021-02-23 ENCOUNTER — Ambulatory Visit: Payer: Medicaid Other | Admitting: Surgical

## 2021-02-27 ENCOUNTER — Encounter: Payer: Self-pay | Admitting: Physical Therapy

## 2021-02-27 ENCOUNTER — Other Ambulatory Visit: Payer: Self-pay

## 2021-02-27 ENCOUNTER — Ambulatory Visit: Payer: Medicaid Other | Attending: Surgical | Admitting: Physical Therapy

## 2021-02-27 DIAGNOSIS — M546 Pain in thoracic spine: Secondary | ICD-10-CM | POA: Insufficient documentation

## 2021-02-27 DIAGNOSIS — M545 Low back pain, unspecified: Secondary | ICD-10-CM | POA: Insufficient documentation

## 2021-02-27 DIAGNOSIS — G8929 Other chronic pain: Secondary | ICD-10-CM | POA: Diagnosis present

## 2021-02-27 DIAGNOSIS — M542 Cervicalgia: Secondary | ICD-10-CM | POA: Insufficient documentation

## 2021-02-27 DIAGNOSIS — R293 Abnormal posture: Secondary | ICD-10-CM | POA: Diagnosis present
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
## 2021-02-27 NOTE — Patient Instructions (Signed)
Access Code: [email or web address] URL: [email or web address] Date: 02/27/2021 Prepared by: Estill Bamberg April Thurnell Garbe  Exercises Supine Cervical Retraction with Towel - 1 x daily - 7 x weekly - 2 sets - 10 reps - 3 sec hold Seated Scapular Retraction - 1 x daily - 7 x weekly - 2 sets - 10 reps - 3 sec hold Supine Posterior Pelvic Tilt - 1 x daily - 7 x weekly - 2 sets - 10 reps - 3 sec hold

## 2021-03-02 ENCOUNTER — Ambulatory Visit (INDEPENDENT_AMBULATORY_CARE_PROVIDER_SITE_OTHER): Payer: Medicaid Other | Admitting: Surgical

## 2021-03-02 ENCOUNTER — Ambulatory Visit (INDEPENDENT_AMBULATORY_CARE_PROVIDER_SITE_OTHER): Payer: Medicaid Other

## 2021-03-02 DIAGNOSIS — M546 Pain in thoracic spine: Secondary | ICD-10-CM

## 2021-03-02 DIAGNOSIS — M545 Low back pain, unspecified: Secondary | ICD-10-CM

## 2021-03-02 DIAGNOSIS — R200 Anesthesia of skin: Secondary | ICD-10-CM

## 2021-03-03 ENCOUNTER — Encounter: Payer: Self-pay | Admitting: Surgical
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
## 2021-03-05 ENCOUNTER — Telehealth: Payer: Self-pay

## 2021-03-05 NOTE — Telephone Encounter (Signed)
Tried calling and left message on machine
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
## 2021-03-06 ENCOUNTER — Telehealth: Payer: Self-pay

## 2021-03-06 ENCOUNTER — Other Ambulatory Visit: Payer: Self-pay | Admitting: Physician Assistant

## 2021-03-06 ENCOUNTER — Other Ambulatory Visit: Payer: Self-pay

## 2021-03-06 MED ORDER — XTAMPZA ER 36 MG PO C12A: 36.0000 mg | EXTENDED_RELEASE_CAPSULE | Freq: Two times a day (BID) | ORAL | 0 refills | Status: DC

## 2021-03-06 MED ORDER — OXYCODONE HCL 5 MG PO TABS: 5.0000 mg | ORAL_TABLET | Freq: Three times a day (TID) | ORAL | 0 refills | Status: DC | PRN

## 2021-03-06 NOTE — Telephone Encounter (Signed)
See below

## 2021-03-06 NOTE — Telephone Encounter (Signed)
[name] Needs refill on oxycodone 5mg  PER PMP last filled 02/01/2021 and oxycodone ER . PER PMP LAST FILLED 02/06/2021.  Any questions he can be reached at [phone]

## 2021-03-06 NOTE — Telephone Encounter (Signed)
Patient called he is returning Dr.Deans call, call back:[phone]

## 2021-03-07 ENCOUNTER — Other Ambulatory Visit: Payer: Self-pay

## 2021-03-07 ENCOUNTER — Encounter: Payer: Self-pay | Admitting: Physical Therapy

## 2021-03-07 ENCOUNTER — Ambulatory Visit: Payer: Medicaid Other | Admitting: Physical Therapy

## 2021-03-07 DIAGNOSIS — G8929 Other chronic pain: Secondary | ICD-10-CM

## 2021-03-07 DIAGNOSIS — M546 Pain in thoracic spine: Secondary | ICD-10-CM

## 2021-03-07 DIAGNOSIS — M545 Low back pain, unspecified: Secondary | ICD-10-CM

## 2021-03-07 DIAGNOSIS — M542 Cervicalgia: Secondary | ICD-10-CM

## 2021-03-07 DIAGNOSIS — R293 Abnormal posture: Secondary | ICD-10-CM

## 2021-03-07 NOTE — Patient Instructions (Signed)
  Access Code: [email or web address] URL: [email or web address] Date: 03/07/2021 Prepared by: Pollyann Samples  Exercises Supine Cervical Retraction with Towel - 1 x daily - 7 x weekly - 2 sets - 10 reps - 3 sec hold Seated Scapular Retraction - 1 x daily - 7 x weekly - 2 sets - 10 reps - 3 sec hold Supine Posterior Pelvic Tilt - 1 x daily - 7 x weekly - 2 sets - 10 reps - 3 sec hold Clamshell - 1 x daily - 7 x weekly - 2 sets - 10 reps Bridge - 1 x daily - 7 x weekly - 3 sets - 10 reps Sidelying Shoulder Horizontal Abduction - 1 x daily - 7 x weekly - 3 sets - 10 reps Supine Posterior Pelvic Tilt - 1 x daily - 7 x weekly - 3 sets - 10 reps Sidelying Thoracic Lumbar Rotation - 1 x daily - 7 x weekly - 2 sets - 10 reps (NEW 5/18) Bridge - 1 x daily - 7 x weekly - 2 sets - 10 reps (NEW 5/18)

## 2021-03-07 NOTE — Telephone Encounter (Signed)
Just talk with [name].  He still having a lot of pain.  Essentially his cervical thoracic and MRI scan show pretty minimal disc bulges but nothing operative.  He has had injections which have not helped.  He is on opioid pain medicine.  Still is having trouble with twisting.  I told him that he is really exhausted my knowledge base for treatment of this type of problem.  I do not see a definite structural problem in the axial spine.  He is going to therapy which may help.  For now I think the thing to do would be consider getting another opinion or trying to live with this as best he can.  Expressed all this to him.  He is going to consider his options.  We can see him back as needed.  I do think he has pain generators but we have not really been able to identify them on exam or with imaging studies and we have not really been able to treat them effectively with steroids pain medicine or physical therapy.

## 2021-03-08 ENCOUNTER — Ambulatory Visit: Payer: Medicaid Other | Admitting: Physical Therapy

## 2021-03-08 ENCOUNTER — Telehealth: Payer: Self-pay | Admitting: Physical Medicine and Rehabilitation

## 2021-03-08 NOTE — Telephone Encounter (Signed)
Called pt and sch. 

## 2021-03-08 NOTE — Telephone Encounter (Signed)
Pt called stating [name] said he would put in a referral for Dr. Ernestina Patches and the pt wanted to make sure as soon as the referral went through he could get a call to get scheduled.   [phone]
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
## 2021-03-13 ENCOUNTER — Ambulatory Visit: Payer: Medicaid Other | Admitting: Physical Therapy

## 2021-03-13 ENCOUNTER — Encounter: Payer: Self-pay | Admitting: Physical Therapy

## 2021-03-13 ENCOUNTER — Other Ambulatory Visit: Payer: Self-pay

## 2021-03-13 DIAGNOSIS — M546 Pain in thoracic spine: Secondary | ICD-10-CM | POA: Diagnosis not present

## 2021-03-13 DIAGNOSIS — G8929 Other chronic pain: Secondary | ICD-10-CM

## 2021-03-13 DIAGNOSIS — R293 Abnormal posture: Secondary | ICD-10-CM

## 2021-03-13 DIAGNOSIS — M545 Low back pain, unspecified: Secondary | ICD-10-CM

## 2021-03-13 DIAGNOSIS — M542 Cervicalgia: Secondary | ICD-10-CM

## 2021-03-13 NOTE — Patient Instructions (Signed)
Access Code: [email or web address] URL: [email or web address] Date: 03/13/2021 Prepared by: Pollyann Samples  NEW Exercises  Static Prone on Elbows - 2 x daily - 7 x weekly - 2 min hold Cervical Retraction Prone on Elbows - 4 x daily - 7 x weekly - 10 reps
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
## 2021-03-14 ENCOUNTER — Encounter: Payer: Self-pay | Admitting: Physical Medicine and Rehabilitation

## 2021-03-14 ENCOUNTER — Ambulatory Visit (INDEPENDENT_AMBULATORY_CARE_PROVIDER_SITE_OTHER): Payer: Medicaid Other | Admitting: Physical Medicine and Rehabilitation

## 2021-03-14 ENCOUNTER — Telehealth: Payer: Self-pay

## 2021-03-14 DIAGNOSIS — R202 Paresthesia of skin: Secondary | ICD-10-CM

## 2021-03-14 NOTE — Telephone Encounter (Signed)
Vicente Males from [employer] called regarding prior authorization for a injection, she needs clarification regarding previous injections the patient received call [phone] extension [phone] Reference # [id number]

## 2021-03-14 NOTE — Progress Notes (Signed)
Numbness in both hands. Right hand is worse- worse in third, fourth, and fifth fingers. All fingers on left.  Right hand dominant No lotion per patient Numeric Pain Rating Scale and Functional Assessment Average Pain 9   In the last MONTH (on 0-10 scale) has pain interfered with the following?  1. General activity like being  able to carry out your everyday physical activities such as walking, climbing stairs, carrying groceries, or moving a chair?  Rating(9)

## 2021-03-15 ENCOUNTER — Ambulatory Visit: Payer: Medicaid Other | Admitting: Physical Therapy

## 2021-03-15 NOTE — Procedures (Signed)
EMG & NCV Findings: Evaluation of the left median motor nerve showed reduced amplitude (3.8 mV) and decreased conduction velocity (Elbow-Wrist, 48 m/s).  The right median motor and the left ulnar sensory nerves showed reduced amplitude (R4.0, L9.5 V).  The left median (across palm) sensory and the right median (across palm) sensory nerves showed prolonged distal peak latency (Wrist, L3.8, R4.0 ms).  All remaining nerves (as indicated in the following tables) were within normal limits.  All left vs. right side differences were within normal limits.    All examined muscles (as indicated in the following table) showed no evidence of electrical instability.    Impression: The above electrodiagnostic study is ABNORMAL and reveals evidence of a mild to moderate bilateral median nerve entrapment at the wrist (carpal tunnel syndrome) affecting sensory and motor components.   There is no significant electrodiagnostic evidence of any other focal nerve entrapment, brachial plexopathy or cervical radiculopathy.   Recommendations: 1.  Follow-up with referring physician. 2.  Continue current management of symptoms.  ___________________________ [name] FAAPMR Board Certified, American Board of Physical Medicine and Rehabilitation    Nerve Conduction Studies Anti Sensory Summary Table   Stim Site NR Peak (ms) Norm Peak (ms) P-T Amp (V) Norm P-T Amp Site1 Site2 Delta-P (ms) Dist (cm) Vel (m/s) Norm Vel (m/s)  Left Median Acr Palm Anti Sensory (2nd Digit)  32.2C  Wrist    *3.8 <3.6 18.3 >10 Wrist Palm 2.1 0.0    Palm    1.7 <2.0 7.5         Right Median Acr Palm Anti Sensory (2nd Digit)  30.9C  Wrist    *4.0 <3.6 17.7 >10 Wrist Palm 2.1 0.0    Palm    1.9 <2.0 5.9         Left Radial Anti Sensory (Base 1st Digit)  32C  Wrist    2.2 <3.1 22.4  Wrist Base 1st Digit 2.2 0.0    Right Radial Anti Sensory (Base 1st Digit)  31.2C  Wrist    2.2 <3.1 16.3  Wrist Base 1st Digit 2.2 0.0    Left Ulnar  Anti Sensory (5th Digit)  32.5C  Wrist    3.3 <3.7 *9.5 >15.0 Wrist 5th Digit 3.3 14.0 42 >38  Right Ulnar Anti Sensory (5th Digit)  31.2C  Wrist    3.3 <3.7 25.5 >15.0 Wrist 5th Digit 3.3 14.0 42 >38   Motor Summary Table   Stim Site NR Onset (ms) Norm Onset (ms) O-P Amp (mV) Norm O-P Amp Site1 Site2 Delta-0 (ms) Dist (cm) Vel (m/s) Norm Vel (m/s)  Left Median Motor (Abd Poll Brev)  32.4C  Wrist    4.2 <4.2 *3.8 >5 Elbow Wrist 4.9 23.5 *48 >50  Elbow    9.1  4.3         Right Median Motor (Abd Poll Brev)  31.8C  Wrist    4.2 <4.2 *4.0 >5 Elbow Wrist 4.6 24.0 52 >50  Elbow    8.8  3.1         Left Ulnar Motor (Abd Dig Min)  32.4C  Wrist    3.2 <4.2 7.1 >3 B Elbow Wrist 3.7 23.0 62 >53  B Elbow    6.9  6.7  A Elbow B Elbow 1.4 10.0 71 >53  A Elbow    8.3  7.2         Right Ulnar Motor (Abd Dig Min)  32C  Wrist    3.1 <4.2 7.5 >3  B Elbow Wrist 4.0 23.5 59 >53  B Elbow    7.1  7.3  A Elbow B Elbow 1.3 11.0 85 >53  A Elbow    8.4  7.0          EMG   Side Muscle Nerve Root Ins Act Fibs Psw Amp Dur Poly Recrt Int Fraser Din Comment  Right Abd Poll Brev Median C8-T1 Nml Nml Nml Nml Nml 0 Nml Nml   Right 1stDorInt Ulnar C8-T1 Nml Nml Nml Nml Nml 0 Nml Nml   Right PronatorTeres Median C6-7 Nml Nml Nml Nml Nml 0 Nml Nml   Right Biceps Musculocut C5-6 Nml Nml Nml Nml Nml 0 Nml Nml   Right Deltoid Axillary C5-6 Nml Nml Nml Nml Nml 0 Nml Nml     Nerve Conduction Studies Anti Sensory Left/Right Comparison   Stim Site L Lat (ms) R Lat (ms) L-R Lat (ms) L Amp (V) R Amp (V) L-R Amp (%) Site1 Site2 L Vel (m/s) R Vel (m/s) L-R Vel (m/s)  Median Acr Palm Anti Sensory (2nd Digit)  32.2C  Wrist *3.8 *4.0 0.2 18.3 17.7 3.3 Wrist Palm     Palm 1.7 1.9 0.2 7.5 5.9 21.3       Radial Anti Sensory (Base 1st Digit)  32C  Wrist 2.2 2.2 0.0 22.4 16.3 27.2 Wrist Base 1st Digit     Ulnar Anti Sensory (5th Digit)  32.5C  Wrist 3.3 3.3 0.0 *9.5 25.5 62.7 Wrist 5th Digit 42 42 0   Motor Left/Right  Comparison   Stim Site L Lat (ms) R Lat (ms) L-R Lat (ms) L Amp (mV) R Amp (mV) L-R Amp (%) Site1 Site2 L Vel (m/s) R Vel (m/s) L-R Vel (m/s)  Median Motor (Abd Poll Brev)  32.4C  Wrist 4.2 4.2 0.0 *3.8 *4.0 5.0 Elbow Wrist *48 52 4  Elbow 9.1 8.8 0.3 4.3 3.1 27.9       Ulnar Motor (Abd Dig Min)  32.4C  Wrist 3.2 3.1 0.1 7.1 7.5 5.3 B Elbow Wrist 62 59 3  B Elbow 6.9 7.1 0.2 6.7 7.3 8.2 A Elbow B Elbow 71 85 14  A Elbow 8.3 8.4 0.1 7.2 7.0 2.8          Waveforms:

## 2021-03-15 NOTE — Progress Notes (Signed)
[name] - 45 y.o. male MRN [id number]  Date of birth: Feb 19, 1976  Office Visit Note: Visit Date: 03/14/2021 PCP: Imagene Riches, NP Referred by: Imagene Riches, NP  Subjective: Chief Complaint  Patient presents with  . Right Hand - Numbness  . Left Hand - Numbness   HPI:  [name] is a 45 y.o. male who comes in today at the request of Dr. Anderson Malta for electrodiagnostic study of the Bilateral upper extremities.  Patient is Right hand dominant.  He describes 9 out of 10 pain with numbness and tingling in both hands particularly in the third and fourth and fifth digits in a somewhat nondermatomal or crossing pattern of peripheral nerves versus dermatomal pattern.  He reports this is chronic and ongoing and has been really recalcitrant to any treatment at this point.  We have seen the patient more recently for injection which was mildly beneficial but very fleeting.  He has had no prior electrodiagnostic study.  Prior notes and Dr. Randel Pigg notes can be reviewed for full details.  As a side note the patient reports to me that over the weekend he had called in to ask a question about pain medication and spoke with Dr. Basil Dess.  The patient reports that he felt like he was not treated very well and that he did end up talking with our office manager Charlotte Sanes.  The patient reported to me that he was of the understanding that Dr. Marlou Sa would not see him any further.  He was questioning who would review the results from today.  We did have him talk to Charlotte Sanes again today and they were able to come up with a good solution.  Further details will be referred to Dr. Marlou Sa and Charlotte Sanes.  ROS Otherwise per HPI.  Assessment & Plan: Visit Diagnoses:    ICD-10-CM   1. 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   There is no significant electrodiagnostic evidence of any other focal nerve entrapment, brachial plexopathy or cervical radiculopathy.   Recommendations: 1.  Follow-up with referring physician. 2.  Continue current management of symptoms.  Meds & Orders: No orders of the defined types were placed in this encounter.   Orders Placed This Encounter  Procedures  . NCV with EMG (electromyography)    Follow-up: Return in about 2 weeks (around 03/28/2021) for  Anderson Malta, MD.   Procedures: No procedures performed  EMG & NCV Findings: Evaluation of the left median motor nerve showed reduced amplitude (3.8 mV) and decreased conduction velocity (Elbow-Wrist, 48 m/s).  The right median motor and the left ulnar sensory nerves showed reduced amplitude (R4.0, L9.5 V).  The left median (across palm) sensory and the right median (across palm) sensory nerves showed prolonged distal peak latency (Wrist, L3.8, R4.0 ms).  All remaining nerves (as indicated in the following tables) were within normal limits.  All left vs. right side differences were within normal limits.    All examined muscles (as indicated in the following table) showed no evidence of electrical instability.    Impression: The above electrodiagnostic study is ABNORMAL and reveals evidence of a mild to moderate bilateral median nerve entrapment at the wrist (carpal tunnel syndrome) affecting sensory and motor components.   There  is no significant electrodiagnostic evidence of any other focal nerve entrapment, brachial plexopathy or cervical radiculopathy.   Recommendations: 1.  Follow-up with referring physician. 2.  Continue current management of symptoms.  ___________________________ Laurence Spates FAAPMR Board Certified, American Board of Physical Medicine and Rehabilitation    Nerve Conduction Studies Anti Sensory Summary Table   Stim Site NR Peak (ms) Norm  Peak (ms) P-T Amp (V) Norm P-T Amp Site1 Site2 Delta-P (ms) Dist (cm) Vel (m/s) Norm Vel (m/s)  Left Median Acr Palm Anti Sensory (2nd Digit)  32.2C  Wrist    *3.8 <3.6 18.3 >10 Wrist Palm 2.1 0.0    Palm    1.7 <2.0 7.5         Right Median Acr Palm Anti Sensory (2nd Digit)  30.9C  Wrist    *4.0 <3.6 17.7 >10 Wrist Palm 2.1 0.0    Palm    1.9 <2.0 5.9         Left Radial Anti Sensory (Base 1st Digit)  32C  Wrist    2.2 <3.1 22.4  Wrist Base 1st Digit 2.2 0.0    Right Radial Anti Sensory (Base 1st Digit)  31.2C  Wrist    2.2 <3.1 16.3  Wrist Base 1st Digit 2.2 0.0    Left Ulnar Anti Sensory (5th Digit)  32.5C  Wrist    3.3 <3.7 *9.5 >15.0 Wrist 5th Digit 3.3 14.0 42 >38  Right Ulnar Anti Sensory (5th Digit)  31.2C  Wrist    3.3 <3.7 25.5 >15.0 Wrist 5th Digit 3.3 14.0 42 >38   Motor Summary Table   Stim Site NR Onset (ms) Norm Onset (ms) O-P Amp (mV) Norm O-P Amp Site1 Site2 Delta-0 (ms) Dist (cm) Vel (m/s) Norm Vel (m/s)  Left Median Motor (Abd Poll Brev)  32.4C  Wrist    4.2 <4.2 *3.8 >5 Elbow Wrist 4.9 23.5 *48 >50  Elbow    9.1  4.3         Right Median Motor (Abd Poll Brev)  31.8C  Wrist    4.2 <4.2 *4.0 >5 Elbow Wrist 4.6 24.0 52 >50  Elbow    8.8  3.1         Left Ulnar Motor (Abd Dig Min)  32.4C  Wrist    3.2 <4.2 7.1 >3 B Elbow Wrist 3.7 23.0 62 >53  B Elbow    6.9  6.7  A Elbow B Elbow 1.4 10.0 71 >53  A Elbow    8.3  7.2         Right Ulnar Motor (Abd Dig Min)  32C  Wrist    3.1 <4.2 7.5 >3 B Elbow Wrist 4.0 23.5 59 >53  B Elbow    7.1  7.3  A Elbow B Elbow 1.3 11.0 85 >53  A Elbow    8.4  7.0          EMG   Side Muscle Nerve Root Ins Act Fibs Psw Amp Dur Poly Recrt Int Fraser Din Comment  Right Abd Poll Brev Median C8-T1 Nml Nml Nml Nml Nml 0 Nml Nml   Right 1stDorInt Ulnar C8-T1 Nml Nml Nml Nml Nml 0 Nml Nml   Right PronatorTeres Median C6-7 Nml Nml Nml Nml Nml 0 Nml Nml   Right Biceps Musculocut C5-6 Nml Nml Nml Nml Nml 0 Nml Nml   Right Deltoid Axillary  C5-6 Nml Nml Nml Nml Nml 0 Nml Nml     Nerve Conduction Studies Anti Sensory Left/Right  Comparison   Stim Site L Lat (ms) R Lat (ms) L-R Lat (ms) L Amp (V) R Amp (V) L-R Amp (%) Site1 Site2 L Vel (m/s) R Vel (m/s) L-R Vel (m/s)  Median Acr Palm Anti Sensory (2nd Digit)  32.2C  Wrist *3.8 *4.0 0.2 18.3 17.7 3.3 Wrist Palm     Palm 1.7 1.9 0.2 7.5 5.9 21.3       Radial Anti Sensory (Base 1st Digit)  32C  Wrist 2.2 2.2 0.0 22.4 16.3 27.2 Wrist Base 1st Digit     Ulnar Anti Sensory (5th Digit)  32.5C  Wrist 3.3 3.3 0.0 *9.5 25.5 62.7 Wrist 5th Digit 42 42 0   Motor Left/Right Comparison   Stim Site L Lat (ms) R Lat (ms) L-R Lat (ms) L Amp (mV) R Amp (mV) L-R Amp (%) Site1 Site2 L Vel (m/s) R Vel (m/s) L-R Vel (m/s)  Median Motor (Abd Poll Brev)  32.4C  Wrist 4.2 4.2 0.0 *3.8 *4.0 5.0 Elbow Wrist *48 52 4  Elbow 9.1 8.8 0.3 4.3 3.1 27.9       Ulnar Motor (Abd Dig Min)  32.4C  Wrist 3.2 3.1 0.1 7.1 7.5 5.3 B Elbow Wrist 62 59 3  B Elbow 6.9 7.1 0.2 6.7 7.3 8.2 A Elbow B Elbow 71 85 14  A Elbow 8.3 8.4 0.1 7.2 7.0 2.8          Waveforms:                      Clinical History: MRI CERVICAL SPINE WITHOUT CONTRAST  TECHNIQUE: Multiplanar, multisequence MR imaging of the cervical spine was performed. No intravenous contrast was administered.  COMPARISON: None.  FINDINGS: Please note image quality is mildly degraded by motion artifact.  Alignment: Normal.  Vertebrae: Normal bone marrow signal intensity. No focal osseous lesion.  Cord: Mild heterogeneity of the cord secondary to motion artifact. No definite focal lesion.  Posterior Fossa, vertebral arteries: Negative.  Disc levels: Mild multilevel desiccation.  C2-3: No significant disc bulge. Patent spinal canal and neural foramen.  C3-4: Left predominant uncovertebral and facet degenerative spurring. Patent spinal canal and right neural foramen. Mild left neural foraminal narrowing.  C4-5:  Uncovertebral and facet degenerative spurring. Patent spinal canal and right neural foramen. Mild left neural foraminal narrowing.  C5-6: No significant disc bulge. Left predominant uncovertebral and facet degenerative spurring. Patent spinal canal and right neural foramen. Mild left neural foraminal narrowing.  C6-7: No significant disc bulge. Patent spinal canal and neural foramen.  C7-T1: No significant disc bulge. Patent spinal canal and neural foramen.  Paraspinal tissues: Negative.  IMPRESSION: Mild spondylosis. No significant spinal canal narrowing.  Mild left neural foraminal narrowing at the C3-C6 levels.   Electronically Signed By: Primitivo Gauze M.D. On: 11/10/2020 19:44 ---- MRI THORACIC AND LUMBAR SPINE WITHOUT CONTRAST  TECHNIQUE: Multiplanar and multiecho pulse sequences of the thoracic and lumbar spine were obtained without intravenous contrast.  COMPARISON:  None.  FINDINGS: MRI THORACIC SPINE FINDINGS  Alignment:  Physiologic.  Vertebrae: No fracture, evidence of discitis, or bone lesion.  Cord:  Normal signal and morphology.  Paraspinal and other soft tissues: Negative.  Disc levels:  No disc herniation or stenosis  MRI LUMBAR SPINE FINDINGS  Segmentation:  Standard  Alignment:  Physiologic.  Vertebrae:  No fracture, evidence of discitis, or bone lesion.  Conus medullaris and cauda equina: Conus extends to the T12-L1 level. Conus and cauda equina appear normal.  Paraspinal and  other soft tissues: Negative  Disc levels:  With the levels above L4 are normal.  L4-5: Small central disc protrusion. No spinal canal or neural foraminal stenosis.  L5-S1: Small central disc protrusion. No spinal canal or neural foraminal stenosis.  IMPRESSION: Small central disc protrusions at L4-5 and L5-S1 without spinal canal or neural foraminal stenosis. Otherwise normal thoracic and lumbar  spine.   Electronically Signed   By: Ulyses Jarred M.D.   On: 08/14/2020 00:01     Objective:  VS:  HT:    WT:   BMI:     BP:   HR: bpm  TEMP: ( )  RESP:  Physical Exam Musculoskeletal:        General: No tenderness.     Comments: Inspection reveals no atrophy of the bilateral APB or FDI or hand intrinsics. There is no swelling, color changes, allodynia or dystrophic changes. There is 5 out of 5 strength in the bilateral wrist extension, finger abduction and long finger flexion. There is intact sensation to light touch in all dermatomal and peripheral nerve distributions. There is a negative Froment's test bilaterally.  There is a negative Hoffmann's test bilaterally.  Skin:    General: Skin is warm and dry.     Findings: No erythema or rash.  Neurological:     General: No focal deficit present.     Mental Status: He is alert and oriented to person, place, and time.     Sensory: No sensory deficit.     Motor: No weakness or abnormal muscle tone.     Coordination: Coordination normal.     Gait: Gait normal.  Psychiatric:        Mood and Affect: Mood normal.        Behavior: Behavior normal.        Thought Content: Thought content normal.      Imaging: No results found.

## 2021-03-15 NOTE — Telephone Encounter (Signed)
Called pt insurance and still PENDING

## 2021-03-16 ENCOUNTER — Ambulatory Visit: Payer: Medicaid Other

## 2021-03-20 ENCOUNTER — Ambulatory Visit: Payer: Medicaid Other | Admitting: Physical Therapy

## 2021-03-22 ENCOUNTER — Ambulatory Visit: Payer: Medicaid Other | Admitting: Physical Therapy

## 2021-03-23 ENCOUNTER — Encounter: Payer: Medicaid Other | Admitting: Physical Medicine and Rehabilitation

## 2021-03-24 IMAGING — MR MR LUMBAR SPINE W/O CM
4 of 5 series · 24 of 48 positions shown · non-contrast
Comparison: None.

CLINICAL DATA: Interscapular back pain

EXAM:
MRI THORACIC AND LUMBAR SPINE WITHOUT CONTRAST
TECHNIQUE: Multiplanar and multiecho pulse sequences of the thoracic and lumbar
spine were obtained without intravenous contrast.

[Series 5: T2 · sagittal · 4.0mm · 0.73mm/px · 6 of 15 slices shown (1 of 2)]
[im 1/15]
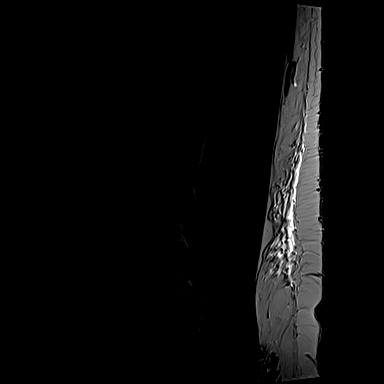
[im 3/15]
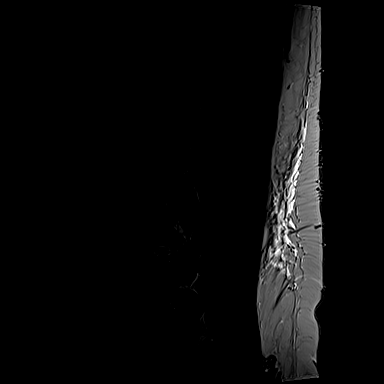
[im 6/15]
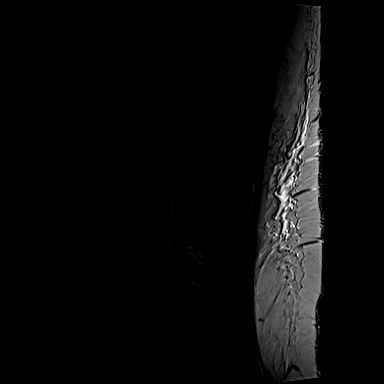
[im 9/15]
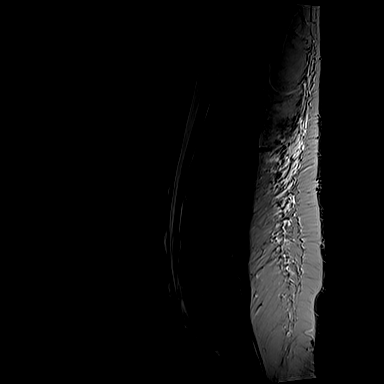
[im 12/15]
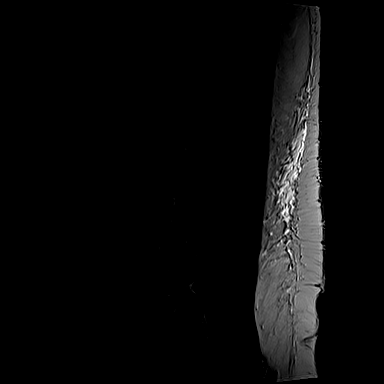
[im 15/15]
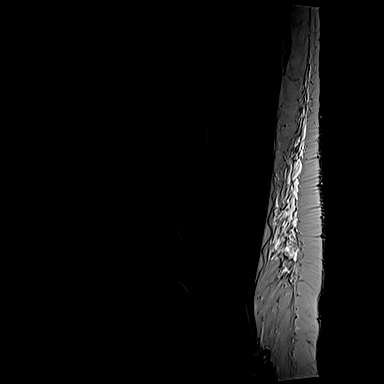

[Series 6: T1 · sagittal · 4.0mm · 0.73mm/px · 5 of 15 slices shown (1 of 2)]
[im 1/15]
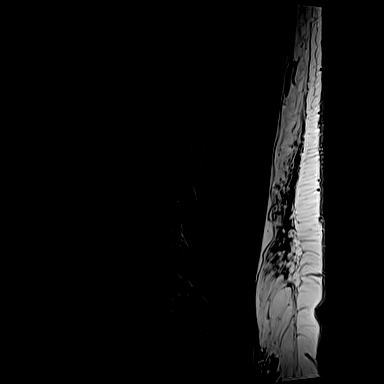
[im 4/15]
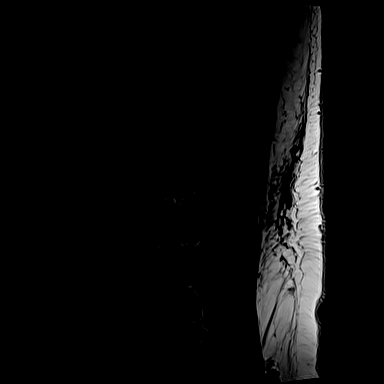
[im 8/15]
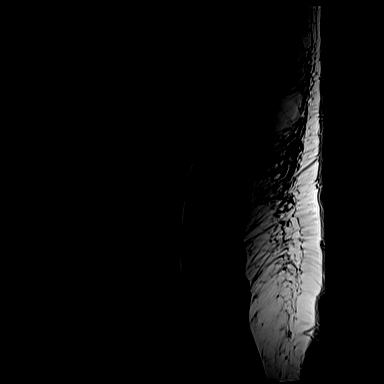
[im 11/15]
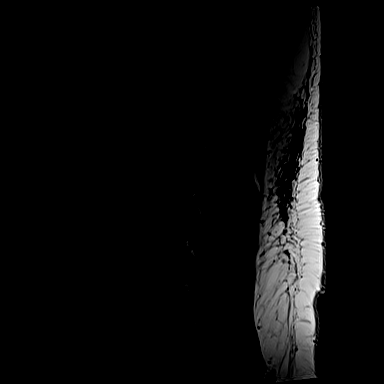
[im 15/15]
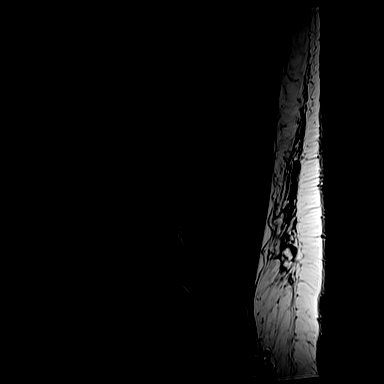

[Series 10: T1 · axial · 4.0mm · 0.35mm/px · z∈[-31,+152]mm · 3 of 45 slices shown (2 of 2)]
[im 6/45]
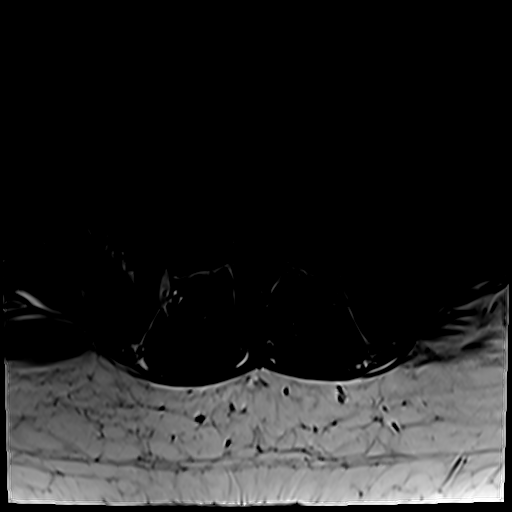
[im 24/45]
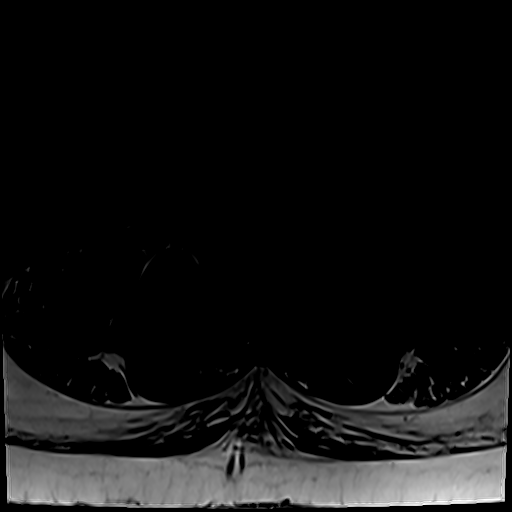
[im 39/45]
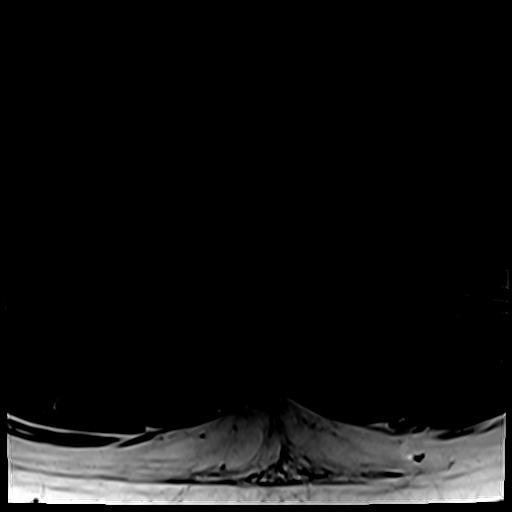

[Series 13: T2 · axial · 4.0mm · 0.35mm/px · z∈[-46,+182]mm · 10 of 45 slices shown (2 of 2)]
[im 3/45]
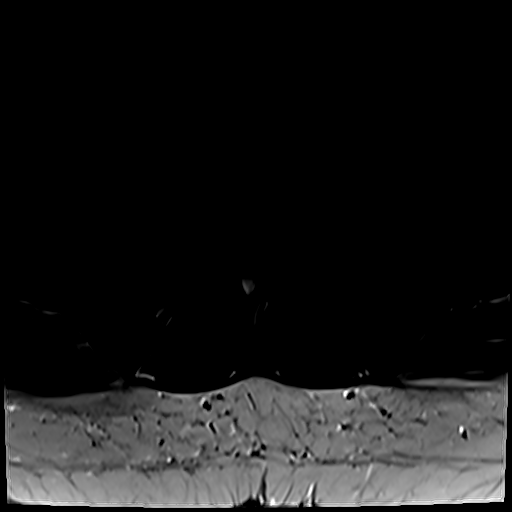
[im 6/45]
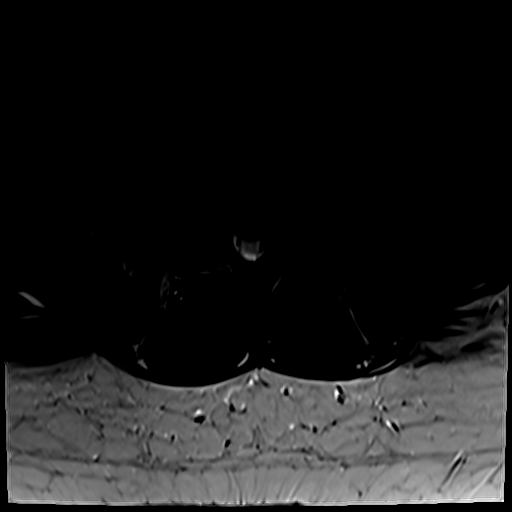
[im 9/45]
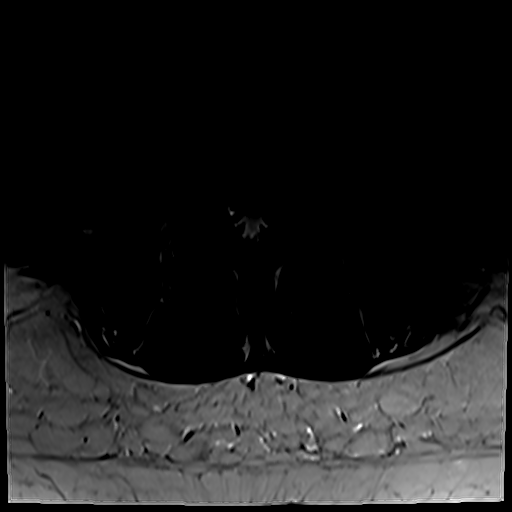
[im 15/45]
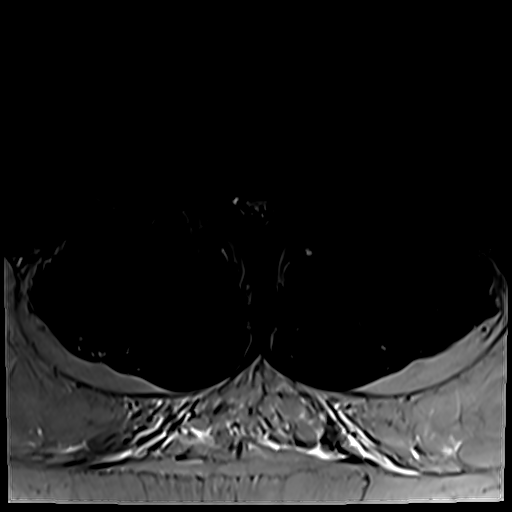
[im 21/45]
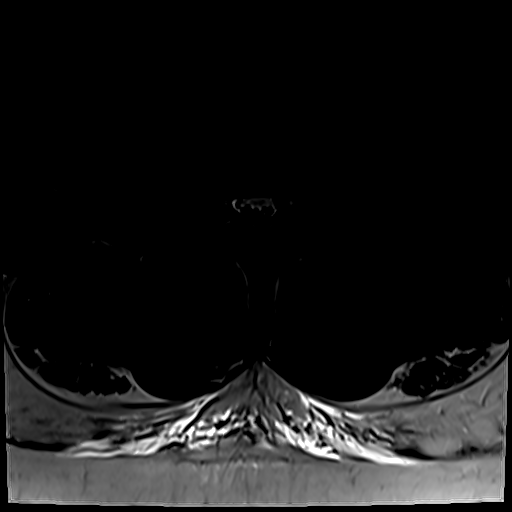
[im 24/45]
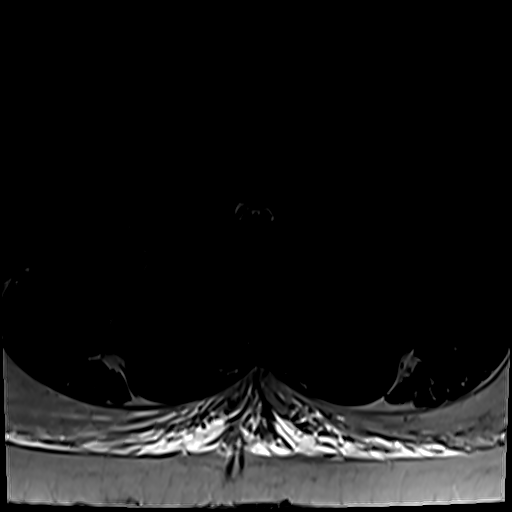
[im 27/45]
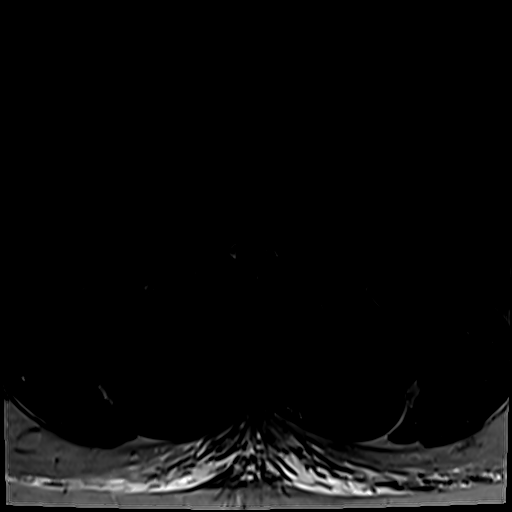
[im 33/45]
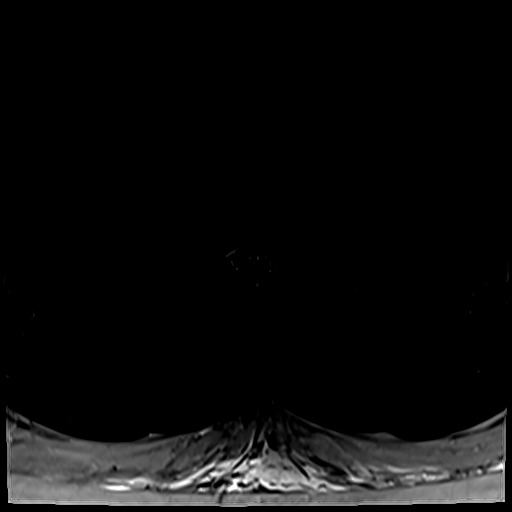
[im 39/45]
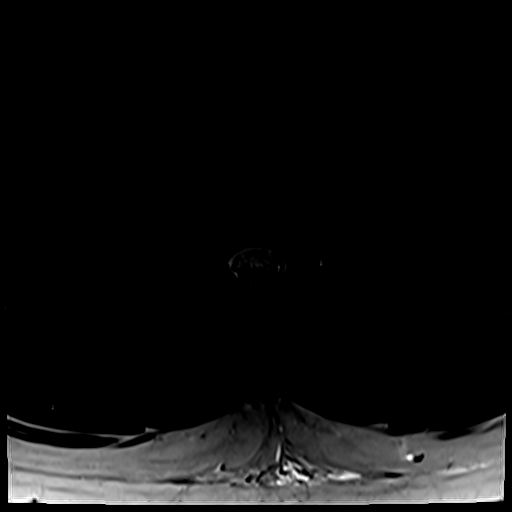
[im 45/45]
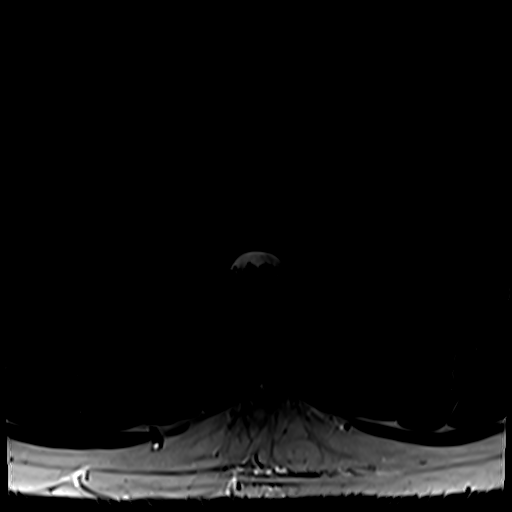

[24 of 48 positions shown; findings below may reference images not displayed]

FINDINGS: MRI THORACIC SPINE FINDINGS

Alignment:  Physiologic.

Vertebrae: No fracture, evidence of discitis, or bone lesion.

Cord:  Normal signal and morphology.

Paraspinal and other soft tissues: Negative.

Disc levels:

No disc herniation or stenosis

MRI LUMBAR SPINE FINDINGS

Segmentation:  Standard

Alignment:  Physiologic.

Vertebrae:  No fracture, evidence of discitis, or bone lesion.

Conus medullaris and cauda equina: Conus extends to the T12-L1
level. Conus and cauda equina appear normal.

Paraspinal and other soft tissues: Negative

Disc levels:

With the levels above L4 are normal.

L4-5: Small central disc protrusion. No spinal canal or neural
foraminal stenosis.

L5-S1: Small central disc protrusion. No spinal canal or neural
foraminal stenosis.
IMPRESSION: Small central disc protrusions at L4-5 and L5-S1 without spinal
canal or neural foraminal stenosis. Otherwise normal thoracic and
lumbar spine.

## 2021-03-27 ENCOUNTER — Ambulatory Visit: Payer: Medicaid Other | Admitting: Physical Therapy

## 2021-03-29 ENCOUNTER — Ambulatory Visit: Payer: Medicaid Other | Admitting: Physical Therapy

## 2021-04-03 ENCOUNTER — Ambulatory Visit: Payer: Medicaid Other | Admitting: Physical Therapy

## 2021-04-05 ENCOUNTER — Ambulatory Visit: Payer: Medicaid Other | Admitting: Physical Therapy

## 2021-04-06 ENCOUNTER — Other Ambulatory Visit: Payer: Self-pay

## 2021-04-06 NOTE — Telephone Encounter (Signed)
Patient has enough medications on hand. He just wanted to make sure Rx will be sent on time for pick up

## 2021-04-09 ENCOUNTER — Telehealth: Payer: Self-pay | Admitting: *Deleted

## 2021-04-09 MED ORDER — OXYCODONE HCL 5 MG PO TABS: 5.0000 mg | ORAL_TABLET | Freq: Three times a day (TID) | ORAL | 0 refills | Status: DC | PRN

## 2021-04-09 MED ORDER — XTAMPZA ER 36 MG PO C12A: 36.0000 mg | EXTENDED_RELEASE_CAPSULE | Freq: Two times a day (BID) | ORAL | 0 refills | Status: DC

## 2021-04-09 NOTE — Telephone Encounter (Signed)
Prior auth submitted for Xtampza 36 mg via CoverMyMeds.

## 2021-04-09 NOTE — Telephone Encounter (Signed)
Refilled pt's Xtampza 36 mg BID #60 and Oxycodone 5 mg TID prn #90- last ordered 1 month ago- is due-

## 2021-04-10 ENCOUNTER — Telehealth: Payer: Self-pay | Admitting: Physical Therapy

## 2021-04-10 ENCOUNTER — Ambulatory Visit: Payer: Medicaid Other | Attending: Surgical | Admitting: Physical Therapy

## 2021-04-10 NOTE — Telephone Encounter (Signed)
Approval received for Xtampza 36 mg 04/10/21- 07/03/21. Pharmacy notified.

## 2021-04-10 NOTE — Telephone Encounter (Signed)
PT called [name], left voice mail about missed appointment today at 34 and reminded patient of next three appointments 6/23, 6/28, 6/30 all at 245 and to call [phone] if not able to attend.  Patient had canceled several prior appointments and may have thought he cancelled them all.

## 2021-04-12 ENCOUNTER — Ambulatory Visit: Payer: Medicaid Other | Admitting: Physical Therapy

## 2021-04-17 ENCOUNTER — Encounter: Payer: Medicaid Other | Admitting: Physical Therapy

## 2021-04-19 ENCOUNTER — Encounter: Payer: Medicaid Other | Admitting: Physical Therapy

## 2021-04-27 ENCOUNTER — Encounter: Payer: Self-pay | Admitting: Surgical

## 2021-04-27 ENCOUNTER — Ambulatory Visit (INDEPENDENT_AMBULATORY_CARE_PROVIDER_SITE_OTHER): Payer: Medicaid Other | Admitting: Surgical

## 2021-04-27 DIAGNOSIS — M545 Low back pain, unspecified: Secondary | ICD-10-CM | POA: Diagnosis not present
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
## 2021-04-30 ENCOUNTER — Encounter: Payer: Medicaid Other | Admitting: Physical Medicine and Rehabilitation

## 2021-05-01 ENCOUNTER — Other Ambulatory Visit: Payer: Self-pay

## 2021-05-01 DIAGNOSIS — M545 Low back pain, unspecified: Secondary | ICD-10-CM

## 2021-05-02 ENCOUNTER — Telehealth: Payer: Self-pay

## 2021-05-02 NOTE — Telephone Encounter (Signed)
[name] also talked with patient this afternoon.

## 2021-05-02 NOTE — Telephone Encounter (Signed)
Pt called stating that his MRI was move to tomorrow at 6 am. Do you want to fit him in the schedule or wait till the next available ?

## 2021-05-04 ENCOUNTER — Ambulatory Visit
Admission: RE | Admit: 2021-05-04 | Discharge: 2021-05-04 | Disposition: A | Discharge status: Home / Self Care | Payer: Medicaid Other | Source: Ambulatory Visit | Attending: Surgical | Admitting: Surgical

## 2021-05-04 ENCOUNTER — Other Ambulatory Visit: Payer: Self-pay

## 2021-05-04 DIAGNOSIS — M545 Low back pain, unspecified: Secondary | ICD-10-CM

## 2021-05-07 ENCOUNTER — Telehealth: Payer: Self-pay | Admitting: *Deleted

## 2021-05-07 NOTE — Telephone Encounter (Signed)
No need for urgent rov can call wed back to me  have luke call wed scan ok

## 2021-05-07 NOTE — Telephone Encounter (Signed)
[name] called clinic @3 :36 pm on Friday 05/04/21 for refills on his oxycodone 5 mg and Xtampza.  Last Rx 04/09/21, unable to see PMP due to system internet issues. Next appt is 07/02/21.

## 2021-05-08 MED ORDER — XTAMPZA ER 36 MG PO C12A: 36.0000 mg | EXTENDED_RELEASE_CAPSULE | Freq: Two times a day (BID) | ORAL | 0 refills | Status: DC

## 2021-05-08 MED ORDER — OXYCODONE HCL 5 MG PO TABS: 5.0000 mg | ORAL_TABLET | Freq: Three times a day (TID) | ORAL | 0 refills | Status: DC | PRN

## 2021-05-09 ENCOUNTER — Telehealth: Payer: Self-pay | Admitting: Physical Medicine and Rehabilitation

## 2021-05-09 DIAGNOSIS — M545 Low back pain, unspecified: Secondary | ICD-10-CM

## 2021-05-09 MED ORDER — NALOXONE HCL 0.4 MG/ML IJ SOLN: INTRAMUSCULAR | 3 refills | Status: DC

## 2021-05-09 NOTE — Telephone Encounter (Signed)
Called and discussed MRI findings

## 2021-05-09 NOTE — Telephone Encounter (Signed)
Pt called and said needs another prior auth for number of tablets However we got approval on 02/01/21 for Oxycodone and 04/10/21 for Xtampza- 3 months for long acting and 6 months for Oxycodone.   Per pt, need to call phone call- [phone]. Have called- they said they wanted another prior authorization.  Spent another 20 minutes on phone trying to get prior auth for both meds.   Sent in Naloxone injection just in case, per request.   Oxycodone- Ref #[id number] Xtampza-Ref#[id number]   Called pt back to let him know- the phone got disconnected- so l/m on his voicemail explaining to not call at the last minutes for meds- 3-4 days ahead of time- and preferably call before noon- not Friday late hoping to get before the weekend.  Of note, the clinic policy is 48 hours from time he called, business hours until he might get refill.  Call pharmacy tomorrow to determine if meds covered

## 2021-05-09 NOTE — Telephone Encounter (Signed)
DR [facility]  [name] called and states that medicaid has changed something about his RX (did not identify which one) and needs for you to call back - they are questioning not drug but quantity  [phone]

## 2021-05-09 NOTE — Telephone Encounter (Signed)
Pt called requesting a call back from [facility] or Dr. Marlou Sa concerning referral to Dr. Ernestina Patches. Please call pt at [phone].

## 2021-05-11 ENCOUNTER — Telehealth: Payer: Self-pay | Admitting: Physical Medicine and Rehabilitation

## 2021-05-11 ENCOUNTER — Telehealth: Payer: Self-pay | Admitting: Surgical

## 2021-05-11 NOTE — Telephone Encounter (Signed)
Patient called to schedule an appointment with Dr. Ernestina Patches. The number to contact patient is [phone]

## 2021-05-11 NOTE — Telephone Encounter (Signed)
Patient called. He would like for [facility] to call him. Has some questions. His call back number is [phone]

## 2021-05-11 NOTE — Telephone Encounter (Signed)
Please see below.

## 2021-05-14 NOTE — Telephone Encounter (Signed)
Tried calling patient. No answer. LMVM advising was returning his call. Advised should leave as many details as possible when calling back to expedite his concerns.

## 2021-05-15 ENCOUNTER — Telehealth: Payer: Self-pay | Admitting: Orthopedic Surgery

## 2021-05-15 ENCOUNTER — Telehealth: Payer: Self-pay | Admitting: Surgical

## 2021-05-15 NOTE — Telephone Encounter (Signed)
Error

## 2021-05-15 NOTE — Telephone Encounter (Signed)
Received vm from [place] w/ Rollene Rotunda, asking if we received their request for records as patient was told we did not have it. IC,lmvm advised request was received and records were faxed 7/19. [phone]

## 2021-05-16 ENCOUNTER — Telehealth: Payer: Self-pay | Admitting: Orthopedic Surgery

## 2021-05-16 ENCOUNTER — Telehealth: Payer: Self-pay

## 2021-05-16 NOTE — Telephone Encounter (Signed)
Received questionnaire from [employer] for SSD. I returned advised we do not complete forms for SSD. Fax [phone]. They are in receipt of copy of medical records.

## 2021-05-16 NOTE — Telephone Encounter (Signed)
Not based on scan thx

## 2021-05-16 NOTE — Telephone Encounter (Signed)
Pt called mention he has spoken with his insurance. Pt insurance is needing info for injection and changing the service level from elective to urgent. Please Advise

## 2021-05-21 ENCOUNTER — Other Ambulatory Visit: Payer: Self-pay | Admitting: Surgical

## 2021-05-21 DIAGNOSIS — M545 Low back pain, unspecified: Secondary | ICD-10-CM

## 2021-05-21 DIAGNOSIS — G8929 Other chronic pain: Secondary | ICD-10-CM

## 2021-05-22 ENCOUNTER — Telehealth: Payer: Self-pay
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
## 2021-05-23 ENCOUNTER — Telehealth: Payer: Self-pay

## 2021-05-23 NOTE — Telephone Encounter (Signed)
scheduled

## 2021-05-23 NOTE — Telephone Encounter (Signed)
Patient would like to know if he can be worked into Dr. Marlou Sa or Lurena Joiner schedule on Friday?  Stated that the pain has gotten worse.  Cb# [phone].  Please advise.

## 2021-05-23 NOTE — Telephone Encounter (Signed)
Patient coming in later this week to be seen.

## 2021-05-24 ENCOUNTER — Other Ambulatory Visit: Payer: Self-pay

## 2021-05-24 ENCOUNTER — Ambulatory Visit (INDEPENDENT_AMBULATORY_CARE_PROVIDER_SITE_OTHER): Payer: Medicaid Other | Admitting: Orthopedic Surgery

## 2021-05-24 DIAGNOSIS — R200 Anesthesia of skin: Secondary | ICD-10-CM

## 2021-05-25 ENCOUNTER — Encounter: Payer: Self-pay | Admitting: Neurology

## 2021-05-27 ENCOUNTER — Encounter: Payer: Self-pay | Admitting: Orthopedic Surgery
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
## 2021-05-29 ENCOUNTER — Other Ambulatory Visit: Payer: Self-pay

## 2021-05-29 ENCOUNTER — Ambulatory Visit
Admission: RE | Admit: 2021-05-29 | Discharge: 2021-05-29 | Disposition: A | Discharge status: Home / Self Care | Payer: Medicaid Other | Source: Ambulatory Visit | Attending: Surgical | Admitting: Surgical

## 2021-05-29 DIAGNOSIS — M545 Low back pain, unspecified: Secondary | ICD-10-CM

## 2021-05-29 MED ORDER — METHYLPREDNISOLONE ACETATE 40 MG/ML INJ SUSP (RADIOLOG
80.0000 mg | Freq: Once | INTRAMUSCULAR | Status: AC
  Administered 2021-05-29: 80 mg via EPIDURAL

## 2021-05-29 MED ORDER — IOPAMIDOL (ISOVUE-M 200) INJECTION 41%
1.0000 mL | Freq: Once | INTRAMUSCULAR | Status: AC
  Administered 2021-05-29: 1 mL via EPIDURAL

## 2021-05-29 NOTE — Discharge Instructions (Signed)

## 2021-06-06 ENCOUNTER — Telehealth: Payer: Self-pay | Admitting: *Deleted

## 2021-06-06 ENCOUNTER — Other Ambulatory Visit: Payer: Self-pay | Admitting: Physical Medicine and Rehabilitation

## 2021-06-06 MED ORDER — OXYCODONE HCL 5 MG PO TABS: 5.0000 mg | ORAL_TABLET | Freq: Three times a day (TID) | ORAL | 0 refills | Status: DC | PRN

## 2021-06-06 MED ORDER — XTAMPZA ER 36 MG PO C12A: 36.0000 mg | EXTENDED_RELEASE_CAPSULE | Freq: Two times a day (BID) | ORAL | 0 refills | Status: DC

## 2021-06-06 NOTE — Telephone Encounter (Signed)
Mr Sayles called for a refill on his oxycodone 5 mg and his Xtampza ER 36 mg.  They both were filled 05/09/21.  His next appt with Dr Dagoberto Ligas is 07/02/21

## 2021-06-18 ENCOUNTER — Other Ambulatory Visit: Payer: Self-pay | Admitting: *Deleted

## 2021-06-18 NOTE — Telephone Encounter (Signed)
Incoming fax from [facility] indicates narcan injectable is not available.  Asking if nasal spray is okay

## 2021-06-19 MED ORDER — NALOXONE HCL 4 MG/0.1ML NA LIQD: NASAL | 5 refills | Status: DC

## 2021-06-22 ENCOUNTER — Telehealth: Payer: Self-pay | Admitting: *Deleted

## 2021-06-22 DIAGNOSIS — M545 Low back pain, unspecified: Secondary | ICD-10-CM

## 2021-06-22 NOTE — Telephone Encounter (Signed)
Pt called stating he is needing another ESI at [facility], says they told him he needed a series of 3. He got relief from the first one at the imaging. Please advise.   Pt cb # [phone]

## 2021-06-22 NOTE — Telephone Encounter (Signed)
Yes, glad he got relief

## 2021-06-26 NOTE — Addendum Note (Signed)
Addended byLaurann Montana on: 06/26/2021 09:54 AM   Modules accepted: Orders

## 2021-07-02 ENCOUNTER — Encounter: Payer: Self-pay | Admitting: Physical Medicine and Rehabilitation

## 2021-07-02 ENCOUNTER — Encounter
Payer: Medicaid Other | Attending: Physical Medicine and Rehabilitation | Admitting: Physical Medicine and Rehabilitation

## 2021-07-02 ENCOUNTER — Other Ambulatory Visit: Payer: Self-pay

## 2021-07-02 VITALS — BP 145/89 | HR 102 | Temp 98.8°F | Ht 73.0 in | Wt 302.0 lb

## 2021-07-02 DIAGNOSIS — Z5181 Encounter for therapeutic drug level monitoring: Secondary | ICD-10-CM | POA: Diagnosis not present

## 2021-07-02 DIAGNOSIS — M62838 Other muscle spasm: Secondary | ICD-10-CM | POA: Insufficient documentation

## 2021-07-02 DIAGNOSIS — Z79891 Long term (current) use of opiate analgesic: Secondary | ICD-10-CM | POA: Diagnosis not present

## 2021-07-02 DIAGNOSIS — G894 Chronic pain syndrome: Secondary | ICD-10-CM | POA: Diagnosis not present

## 2021-07-02 DIAGNOSIS — M7918 Myalgia, other site: Secondary | ICD-10-CM | POA: Insufficient documentation

## 2021-07-02 DIAGNOSIS — G8922 Chronic post-thoracotomy pain: Secondary | ICD-10-CM | POA: Diagnosis not present

## 2021-07-02 MED ORDER — OXYCODONE HCL 5 MG PO TABS: 5.0000 mg | ORAL_TABLET | Freq: Three times a day (TID) | ORAL | 0 refills | Status: DC | PRN

## 2021-07-02 MED ORDER — XTAMPZA ER 36 MG PO C12A: 36.0000 mg | EXTENDED_RELEASE_CAPSULE | Freq: Two times a day (BID) | ORAL | 0 refills | Status: DC
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
## 2021-07-05 LAB — TOXASSURE SELECT,+ANTIDEPR,UR

## 2021-07-09 ENCOUNTER — Other Ambulatory Visit: Payer: Self-pay | Admitting: Physical Medicine and Rehabilitation

## 2021-07-10 ENCOUNTER — Telehealth: Payer: Self-pay | Admitting: *Deleted

## 2021-07-10 NOTE — Telephone Encounter (Signed)
Urine drug screen for this encounter is consistent for prescribed medication 

## 2021-07-27 ENCOUNTER — Ambulatory Visit: Payer: Medicaid Other | Admitting: Neurology

## 2021-08-02 ENCOUNTER — Telehealth: Payer: Self-pay

## 2021-08-02 NOTE — Telephone Encounter (Signed)
Patient is calling requesting a refill on Oxycodone and Xtampza. Please advise.  Oxycodone 5 mg- last filled 07/04/21 Xtampza 36 mg last filled 07/04/21

## 2021-08-03 ENCOUNTER — Telehealth: Payer: Self-pay | Admitting: *Deleted

## 2021-08-03 MED ORDER — OXYCODONE HCL 5 MG PO TABS: 5.0000 mg | ORAL_TABLET | Freq: Three times a day (TID) | ORAL | 0 refills | Status: DC | PRN

## 2021-08-03 MED ORDER — XTAMPZA ER 36 MG PO C12A: 36.0000 mg | EXTENDED_RELEASE_CAPSULE | Freq: Two times a day (BID) | ORAL | 0 refills | Status: DC

## 2021-08-03 NOTE — Telephone Encounter (Signed)
Patient notified of medication refills sent to pharmacy.

## 2021-08-03 NOTE — Telephone Encounter (Addendum)
PRIOR AUTH for oxycodone 5 mg and Xtampza 36 mg submitted to insurance via CoverMyMeds.   PA Case: [id number], Oxycodone 5 mg Status: Approved, Coverage Starts on: 08/04/2021 12:00:00 AM, Coverage Ends on: 01/31/2022 12:00:00 AM.  PA Case: [id number], Xtampza ER Status: Approved, Coverage Starts on: 08/04/2021 12:00:00 AM, Coverage Ends on: 11/02/2021 12:00:00 AM.

## 2021-08-05 ENCOUNTER — Other Ambulatory Visit: Payer: Self-pay | Admitting: Physical Medicine and Rehabilitation

## 2021-08-20 ENCOUNTER — Other Ambulatory Visit: Payer: Self-pay | Admitting: Urology

## 2021-08-28 ENCOUNTER — Telehealth: Payer: Self-pay | Admitting: *Deleted

## 2021-08-28 NOTE — Telephone Encounter (Signed)
[name] called for refill on his Xtampza and Oxycodone. Last fill dates were 10/16 and 10/15 and his next appt is 10/01/21.

## 2021-08-29 MED ORDER — XTAMPZA ER 36 MG PO C12A: 36.0000 mg | EXTENDED_RELEASE_CAPSULE | Freq: Two times a day (BID) | ORAL | 0 refills | Status: DC

## 2021-08-29 MED ORDER — OXYCODONE HCL 5 MG PO TABS: 5.0000 mg | ORAL_TABLET | Freq: Three times a day (TID) | ORAL | 0 refills | Status: DC | PRN

## 2021-09-03 ENCOUNTER — Ambulatory Visit
Admission: RE | Admit: 2021-09-03 | Discharge: 2021-09-03 | Disposition: A | Discharge status: Home / Self Care | Payer: Medicaid Other | Source: Ambulatory Visit | Attending: Surgical | Admitting: Surgical

## 2021-09-03 ENCOUNTER — Other Ambulatory Visit: Payer: Self-pay

## 2021-09-03 DIAGNOSIS — M545 Low back pain, unspecified: Secondary | ICD-10-CM

## 2021-09-03 MED ORDER — METHYLPREDNISOLONE ACETATE 40 MG/ML INJ SUSP (RADIOLOG
80.0000 mg | Freq: Once | INTRAMUSCULAR | Status: AC
  Administered 2021-09-03: 80 mg via EPIDURAL

## 2021-09-03 MED ORDER — IOPAMIDOL (ISOVUE-M 200) INJECTION 41%
1.0000 mL | Freq: Once | INTRAMUSCULAR | Status: AC
  Administered 2021-09-03: 1 mL via EPIDURAL

## 2021-09-03 NOTE — Discharge Instructions (Signed)

## 2021-09-19 ENCOUNTER — Ambulatory Visit: Payer: Medicaid Other | Admitting: Neurology

## 2021-09-19 ENCOUNTER — Encounter: Payer: Self-pay | Admitting: Neurology

## 2021-09-19 ENCOUNTER — Other Ambulatory Visit: Payer: Self-pay

## 2021-09-19 VITALS — BP 141/91 | HR 86 | Ht 73.0 in | Wt 299.0 lb

## 2021-09-19 DIAGNOSIS — M79605 Pain in left leg: Secondary | ICD-10-CM

## 2021-09-19 DIAGNOSIS — M79604 Pain in right leg: Secondary | ICD-10-CM | POA: Diagnosis not present

## 2021-09-19 NOTE — Patient Instructions (Signed)
Nerve testing of the legs  ELECTROMYOGRAM AND NERVE CONDUCTION STUDIES (EMG/NCS) INSTRUCTIONS  How to Prepare The neurologist conducting the EMG will need to know if you have certain medical conditions. Tell the neurologist and other EMG lab personnel if you: Have a pacemaker or any other electrical medical device Take blood-thinning medications Have hemophilia, a blood-clotting disorder that causes prolonged bleeding Bathing Take a shower or bath shortly before your exam in order to remove oils from your skin. Don't apply lotions or creams before the exam.  What to Expect You'll likely be asked to change into a hospital gown for the procedure and lie down on an examination table. The following explanations can help you understand what will happen during the exam.  Electrodes. The neurologist or a technician places surface electrodes at various locations on your skin depending on where you're experiencing symptoms. Or the neurologist may insert needle electrodes at different sites depending on your symptoms.  Sensations. The electrodes will at times transmit a tiny electrical current that you may feel as a twinge or spasm. The needle electrode may cause discomfort or pain that usually ends shortly after the needle is removed. If you are concerned about discomfort or pain, you may want to talk to the neurologist about taking a short break during the exam.  Instructions. During the needle EMG, the neurologist will assess whether there is any spontaneous electrical activity when the muscle is at rest - activity that isn't present in healthy muscle tissue - and the degree of activity when you slightly contract the muscle.  He or she will give you instructions on resting and contracting a muscle at appropriate times. Depending on what muscles and nerves the neurologist is examining, he or she may ask you to change positions during the exam.  After your EMG You may experience some temporary, minor  bruising where the needle electrode was inserted into your muscle. This bruising should fade within several days. If it persists, contact your primary care doctor.   
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
## 2021-09-24 ENCOUNTER — Telehealth: Payer: Self-pay

## 2021-09-24 NOTE — Telephone Encounter (Signed)
Patient is calling in to get refills on Xtampza and Oxycodone. Per PMP, last fill for Xtampza was 09/02/21 and Oxycodone was 09/01/21.

## 2021-09-25 ENCOUNTER — Telehealth: Payer: Self-pay

## 2021-09-25 ENCOUNTER — Other Ambulatory Visit: Payer: Self-pay | Admitting: Physical Medicine and Rehabilitation

## 2021-09-25 DIAGNOSIS — G8922 Chronic post-thoracotomy pain: Secondary | ICD-10-CM

## 2021-09-25 MED ORDER — OXYCODONE HCL 5 MG PO TABS: 5.0000 mg | ORAL_TABLET | Freq: Three times a day (TID) | ORAL | 0 refills | Status: DC | PRN

## 2021-09-25 MED ORDER — XTAMPZA ER 36 MG PO C12A: 36.0000 mg | EXTENDED_RELEASE_CAPSULE | Freq: Two times a day (BID) | ORAL | 0 refills | Status: DC

## 2021-09-25 NOTE — Addendum Note (Signed)
Addended by: Courtney Heys on: 09/25/2021 10:18 AM   Modules accepted: Orders

## 2021-09-25 NOTE — Telephone Encounter (Signed)
Patient called stating He has been to the Outpatient Rehab previously, so they need an updated referral for why he needs to come to them this time. Please advise.

## 2021-09-26 NOTE — Addendum Note (Signed)
Addended by: Courtney Heys on: 09/26/2021 10:43 AM   Modules accepted: Orders

## 2021-09-26 NOTE — Telephone Encounter (Signed)
I spoke with [name] and he said you did refer him (it was 01/31/21 under referrals) with request for TENS and he had several treatments but he had to pause it because his mother had been really sick and now they need an updated referral to resume treatment.

## 2021-09-26 NOTE — Telephone Encounter (Signed)
I thought he was referred from his surgeon in past- not me- but can we check into this- thanks- ML

## 2021-10-01 ENCOUNTER — Encounter: Payer: Self-pay | Admitting: Physical Medicine and Rehabilitation

## 2021-10-01 ENCOUNTER — Other Ambulatory Visit: Payer: Self-pay

## 2021-10-01 ENCOUNTER — Encounter
Payer: Medicaid Other | Attending: Physical Medicine and Rehabilitation | Admitting: Physical Medicine and Rehabilitation

## 2021-10-01 VITALS — BP 114/75 | HR 82 | Temp 98.1°F | Ht 73.0 in | Wt 305.2 lb

## 2021-10-01 DIAGNOSIS — M7918 Myalgia, other site: Secondary | ICD-10-CM

## 2021-10-01 DIAGNOSIS — Y939 Activity, unspecified: Secondary | ICD-10-CM | POA: Insufficient documentation

## 2021-10-01 DIAGNOSIS — Y929 Unspecified place or not applicable: Secondary | ICD-10-CM | POA: Insufficient documentation

## 2021-10-01 DIAGNOSIS — G8922 Chronic post-thoracotomy pain: Secondary | ICD-10-CM | POA: Diagnosis present

## 2021-10-01 DIAGNOSIS — M62838 Other muscle spasm: Secondary | ICD-10-CM

## 2021-10-01 MED ORDER — LIDOCAINE 5 % EX PTCH: 3.0000 | MEDICATED_PATCH | CUTANEOUS | 5 refills | Status: DC

## 2021-10-01 NOTE — Patient Instructions (Signed)
Plan: AP and lateral views of thoracic spine reviewed.  Mild degenerative  changes throughout the thoracic spine.  No fracture or spondylolisthesis  noted.  Hardware in position from prior rib fracture fixation with some  lifting off of the middle plate from the shaft of the rib and there is 1  free screw that is not fixed in either plate Went over that screw is not exactly free floating by looking at it- it's likely scarred into place and unlikely to be the cause of his nerve pain.  3.  Put in therapy evaluation- they want to see NCS/EMG results before they see him.  4. Will renew Lidoderm patches 5 Continue Xtampza  mg and Oxyocdone 5 mg TID prn # 90  6. Patient here for trigger point injections for  Consent done and on chart.  Cleaned areas with alcohol and injected using a 27 gauge 1.5 inch needle  Injected 9cc Using 1% Lidocaine with no EPI  Upper traps B/L  Levators  L only Posterior scalenes Middle scalenes- L only Splenius Capitus- B/L  Pectoralis Major- B/L  Rhomboids- B/L x3 Infraspinatus Teres Major/minor- L x2 Thoracic paraspinals B/L x2 Lumbar paraspinals- B/L x3 Other injections- - L chest around thoracotomy scar.    Patient's level of pain prior was 9/10- Current level of pain after injections is- no change until the next day.   There was no bleeding or complications.  Patient was advised to drink a lot of water on day after injections to flush system Will have increased soreness for 12-48 hours after injections.  Can use Lidocaine patches the day AFTER injections Can use theracane on day of injections in places didn't inject Can use heating pad 4-6 hours AFTER injections  7. Speak to general surgeon about loose screw. But I don't think it's the cause of his nerve pain.    8. F/U 3 months- but can see if can get in earlier with front desk.
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
## 2021-10-11 ENCOUNTER — Encounter: Payer: Medicaid Other | Admitting: Neurology
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
## 2021-10-17 ENCOUNTER — Ambulatory Visit: Payer: Medicaid Other | Admitting: Physical Therapy

## 2021-10-25 ENCOUNTER — Telehealth: Payer: Self-pay

## 2021-10-25 NOTE — Telephone Encounter (Signed)
PMP REPORT:   Filled  Drug  QTY  Days  Prescriber  Dispenser  PMP   09/30/2021  Xtampza Er 36 Mg Capsule 60.00 30 Me Lov Nor ([id number]) [place]  09/29/2021  Oxycodone Hcl (Ir) 5 Mg Tablet 90.00 30 Me Lov Nor ([id number]) [place]  Last appointment 10/01/21 Next appointment 12/30/2021
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
## 2021-10-26 ENCOUNTER — Ambulatory Visit: Payer: Medicaid Other

## 2021-10-26 ENCOUNTER — Ambulatory Visit: Payer: Medicaid Other | Admitting: Neurology

## 2021-10-26 MED ORDER — XTAMPZA ER 36 MG PO C12A: 36.0000 mg | EXTENDED_RELEASE_CAPSULE | Freq: Two times a day (BID) | ORAL | 0 refills | Status: DC

## 2021-10-26 MED ORDER — OXYCODONE HCL 5 MG PO TABS: 5.0000 mg | ORAL_TABLET | Freq: Three times a day (TID) | ORAL | 0 refills | Status: DC | PRN

## 2021-11-07 ENCOUNTER — Ambulatory Visit: Payer: Medicaid Other | Attending: Physical Medicine and Rehabilitation
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
## 2021-11-13 ENCOUNTER — Other Ambulatory Visit: Payer: Self-pay

## 2021-11-13 ENCOUNTER — Ambulatory Visit: Payer: Medicaid Other | Admitting: Neurology

## 2021-11-13 DIAGNOSIS — M79605 Pain in left leg: Secondary | ICD-10-CM

## 2021-11-13 DIAGNOSIS — M79604 Pain in right leg: Secondary | ICD-10-CM | POA: Diagnosis not present

## 2021-11-13 NOTE — Procedures (Signed)
[facility]  [place], [place]  [place], [place] [address] Tel: [phone] Fax:  [phone] Test Date:  11/13/2021  Patient: [name] DOB: 12-25-1975 Physician: Narda Amber, DO  Sex: Male Height: 6\' 1"  Ref Phys: Narda Amber, DO  ID#: [id number]   Technician:    Patient Complaints: This is a 46 year old man referred for evaluation of bilateral leg pain.    NCV & EMG Findings: Electrodiagnostic testing of the right lower extremity and additional studies of the left shows: Bilateral sural and superficial peroneal sensory responses are within normal limits. Bilateral peroneal and tibial motor responses are within normal limits. Bilateral tibial H reflex studies are within normal limits. There is no evidence of active or chronic motor axonal changes affecting any of the tested muscles.  Motor unit configuration and recruitment pattern is within normal limits.  Impression: This is a normal study of the lower extremities.  In particular, there is no evidence of a sensorimotor polyneuropathy or lumbosacral radiculopathy.    ___________________________ Narda Amber, DO    Nerve Conduction Studies Anti Sensory Summary Table   Stim Site NR Peak (ms) Norm Peak (ms) P-T Amp (V) Norm P-T Amp  Left Sup Peroneal Anti Sensory (Ant Lat Mall)  32C  12 cm    2.8 <4.5 9.7 >5  Right Sup Peroneal Anti Sensory (Ant Lat Mall)  32C  12 cm    2.2 <4.5 11.3 >5  Left Sural Anti Sensory (Lat Mall)  32C  Calf    3.1 <4.5 17.8 >5  Right Sural Anti Sensory (Lat Mall)  32C  Calf    2.8 <4.5 19.0 >5   Motor Summary Table   Stim Site NR Onset (ms) Norm Onset (ms) O-P Amp (mV) Norm O-P Amp Site1 Site2 Delta-0 (ms) Dist (cm) Vel (m/s) Norm Vel (m/s)  Left Peroneal Motor (Ext Dig Brev)  33C  Ankle    5.7 <6.0 6.8 >2.5 B Fib Ankle 9.9 40.0 40 >40  B Fib    15.6  6.2  Poplt B Fib 1.9 8 42 >40  Poplt    17.5  6.1         Right Peroneal Motor (Ext Dig Brev)  32C  Ankle     3.0 <5.5 6.4 >3 B Fib Ankle 8.2 40.0 49 >40  B Fib    11.2  5.9  Poplt B Fib 1.8 10.0 56 >40  Poplt    13.0  5.8         Left Tibial Motor (Abd Hall Brev)  32C  Ankle    3.4 <6.0 10.1 >8 Knee Ankle 10.1 42.0 42 >40  Knee    13.5  7.9         Right Tibial Motor (Abd Hall Brev)  32C  Ankle    3.5 <6.0 11.1 >8 Knee Ankle 9.8 45.0 46 >40  Knee    13.3  8.9          H Reflex Studies   NR H-Lat (ms) Lat Norm (ms) L-R H-Lat (ms)  Left Tibial (Gastroc)  32C     33.61 <35 0.00  Right Tibial (Gastroc)  32C     33.61 <35 0.00   EMG   Side Muscle Ins Act Fibs Psw Fasc Number Recrt Dur Dur. Amp Amp. Poly Poly. Comment  Right AntTibialis Nml Nml Nml Nml Nml Nml Nml Nml Nml Nml Nml Nml N/A  Right Gastroc Nml Nml Nml Nml Nml Nml Nml Nml Nml Nml  Nml Nml N/A  Right Flex Dig Long Nml Nml Nml Nml Nml Nml Nml Nml Nml Nml Nml Nml N/A  Right RectFemoris Nml Nml Nml Nml Nml Nml Nml Nml Nml Nml Nml Nml N/A  Right GluteusMed Nml Nml Nml Nml Nml Nml Nml Nml Nml Nml Nml Nml N/A  Left AntTibialis Nml Nml Nml Nml Nml Nml Nml Nml Nml Nml Nml Nml N/A  Left Gastroc Nml Nml Nml Nml Nml Nml Nml Nml Nml Nml Nml Nml N/A  Left Flex Dig Long Nml Nml Nml Nml Nml Nml Nml Nml Nml Nml Nml Nml N/A  Left RectFemoris Nml Nml Nml Nml Nml Nml Nml Nml Nml Nml Nml Nml N/A  Left GluteusMed Nml Nml Nml Nml Nml Nml Nml Nml Nml Nml Nml Nml N/A      Waveforms:

## 2021-11-23 ENCOUNTER — Telehealth: Payer: Self-pay

## 2021-11-23 ENCOUNTER — Telehealth: Payer: Self-pay | Admitting: Neurology

## 2021-11-23 NOTE — Telephone Encounter (Signed)
Pt called in and left a message with the access nurse. He was returning a call from our office.

## 2021-11-23 NOTE — Telephone Encounter (Signed)
Patient is calling to request refill on Xtampza and Oxycodone. Per PMP, Last fill for Oxycodone was 10/27/21 and Xtampza was 10/28/21

## 2021-11-23 NOTE — Telephone Encounter (Signed)
Returned patients call. See result notes for EMG.

## 2021-11-26 ENCOUNTER — Telehealth: Payer: Self-pay

## 2021-11-26 MED ORDER — XTAMPZA ER 36 MG PO C12A: 36.0000 mg | EXTENDED_RELEASE_CAPSULE | Freq: Two times a day (BID) | ORAL | 0 refills | Status: DC

## 2021-11-26 MED ORDER — OXYCODONE HCL 5 MG PO TABS: 5.0000 mg | ORAL_TABLET | Freq: Three times a day (TID) | ORAL | 0 refills | Status: DC | PRN

## 2021-11-26 NOTE — Telephone Encounter (Signed)
PA for [facility] sent to [facility]

## 2021-11-26 NOTE — Addendum Note (Signed)
Addended by: Courtney Heys on: 11/26/2021 01:12 PM   Modules accepted: Orders

## 2021-11-27 NOTE — Telephone Encounter (Signed)
PA approved  PA Case: [id number], Status: Approved, Coverage Starts on: 11/27/2021 12:00:00 AM, Coverage Ends on: 02/25/2022 12:00:00 AM.

## 2021-11-28 ENCOUNTER — Telehealth: Payer: Self-pay

## 2021-11-28 NOTE — Telephone Encounter (Signed)
Patient called and was informed him of EMG results:  Please let pt know that nerve and muscle testing is normal.  There is no evidence of nerve injury causing any of his leg pain.  Recommend he follow-up with pain management for symptom control. His neurological testing has been extensive and does not disclose primary nerve/muscle pathology. Thanks.   Patient verbalized understanding and will follow up with pain management as recommended. Patient had no further questions or concerns.

## 2021-12-26 ENCOUNTER — Other Ambulatory Visit: Payer: Self-pay

## 2021-12-26 MED ORDER — XTAMPZA ER 36 MG PO C12A: 36.0000 mg | EXTENDED_RELEASE_CAPSULE | Freq: Two times a day (BID) | ORAL | 0 refills | Status: DC

## 2021-12-26 MED ORDER — OXYCODONE HCL 5 MG PO TABS: 5.0000 mg | ORAL_TABLET | Freq: Three times a day (TID) | ORAL | 0 refills | Status: DC | PRN

## 2021-12-26 NOTE — Telephone Encounter (Signed)
PMR REPORT:  ?Filled  Written  ID  Drug  QTY  Days  Prescriber  RX #  Dispenser  Refill  Daily Dose*  Pymt Type  PMP  ?11/28/2021 11/28/2021 1  ?Phentermine 37.5 Mg Tablet ?15.00 88 Ma Ach [id number] Nor (9825) 0/0  Private Pay [place] ?11/27/2021 11/26/2021 1  ?Oxycodone Hcl (Ir) 5 Mg Tablet ?90.00 30 Me Lov [id number] Nor (9825) 0/0 22.50 MME Medicaid [place] ?11/27/2021 11/26/2021 1  ?Xtampza Er 36 Mg Capsule ?60.00 30 Me Lov [id number] Nor (9825) 0/0 120.00 MME Medicaid [place] ?

## 2021-12-31 ENCOUNTER — Encounter
Payer: Medicaid Other | Attending: Physical Medicine and Rehabilitation | Admitting: Physical Medicine and Rehabilitation

## 2021-12-31 DIAGNOSIS — M62838 Other muscle spasm: Secondary | ICD-10-CM | POA: Insufficient documentation

## 2021-12-31 DIAGNOSIS — G8922 Chronic post-thoracotomy pain: Secondary | ICD-10-CM | POA: Insufficient documentation

## 2022-01-11 ENCOUNTER — Other Ambulatory Visit: Payer: Self-pay | Admitting: Physical Medicine and Rehabilitation

## 2022-01-24 ENCOUNTER — Telehealth: Payer: Self-pay

## 2022-01-24 NOTE — Telephone Encounter (Signed)
Patient is calling to get a refill on the Oxycodone 5 mg and Xtampza. Per PMP, both medications were last filled 12/26/2021 ?

## 2022-01-28 MED ORDER — OXYCODONE HCL 5 MG PO TABS: 5.0000 mg | ORAL_TABLET | Freq: Three times a day (TID) | ORAL | 0 refills | Status: DC | PRN

## 2022-01-28 MED ORDER — XTAMPZA ER 36 MG PO C12A: 36.0000 mg | EXTENDED_RELEASE_CAPSULE | Freq: Two times a day (BID) | ORAL | 0 refills | Status: DC

## 2022-01-28 NOTE — Addendum Note (Signed)
Addended by: Courtney Heys on: 01/28/2022 10:01 AM ? ? Modules accepted: Orders ? ?

## 2022-01-28 NOTE — Telephone Encounter (Signed)
Mr. Wenger called again. If possible pease send his medicine.Thank you! ?

## 2022-02-15 ENCOUNTER — Telehealth: Payer: Self-pay | Admitting: Orthopedic Surgery

## 2022-02-15 NOTE — Telephone Encounter (Signed)
Pt called and states he can barley stand on his left leg. He said his left knee is bulging out the side. Can we work him in before 05/12?  ? ?CB [phone] ?

## 2022-02-15 NOTE — Telephone Encounter (Signed)
IC Dr Marlou Sa had a cancellation for Monday-patient was scheduled 115 Monday.  ?

## 2022-02-18 ENCOUNTER — Ambulatory Visit: Payer: Self-pay

## 2022-02-18 ENCOUNTER — Ambulatory Visit (INDEPENDENT_AMBULATORY_CARE_PROVIDER_SITE_OTHER): Payer: Medicaid Other | Admitting: Surgical

## 2022-02-18 DIAGNOSIS — M25552 Pain in left hip: Secondary | ICD-10-CM | POA: Diagnosis not present

## 2022-02-18 DIAGNOSIS — M25462 Effusion, left knee: Secondary | ICD-10-CM | POA: Diagnosis not present

## 2022-02-24 ENCOUNTER — Encounter: Payer: Self-pay | Admitting: Orthopedic Surgery

## 2022-02-24 MED ORDER — BUPIVACAINE HCL 0.25 % IJ SOLN
4.0000 mL | INTRAMUSCULAR | Status: AC | PRN
  Administered 2022-02-18: 4 mL via INTRA_ARTICULAR

## 2022-02-24 MED ORDER — METHYLPREDNISOLONE ACETATE 40 MG/ML IJ SUSP
40.0000 mg | INTRAMUSCULAR | Status: AC | PRN
  Administered 2022-02-18: 40 mg via INTRA_ARTICULAR

## 2022-02-24 MED ORDER — LIDOCAINE HCL 1 % IJ SOLN
5.0000 mL | INTRAMUSCULAR | Status: AC | PRN
  Administered 2022-02-18: 5 mL
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
## 2022-02-27 ENCOUNTER — Telehealth: Payer: Self-pay | Admitting: *Deleted

## 2022-02-27 NOTE — Telephone Encounter (Signed)
[name] called for refills on both his pain medications.Last fill on both 01/28/22. ?

## 2022-02-28 MED ORDER — OXYCODONE HCL 5 MG PO TABS: 5.0000 mg | ORAL_TABLET | Freq: Three times a day (TID) | ORAL | 0 refills | Status: DC | PRN

## 2022-02-28 MED ORDER — XTAMPZA ER 36 MG PO C12A: 36.0000 mg | EXTENDED_RELEASE_CAPSULE | Freq: Two times a day (BID) | ORAL | 0 refills | Status: DC

## 2022-02-28 NOTE — Telephone Encounter (Signed)
Patient is calling for refill on Xtampza and Oxycodone. He stated he is out of his medication ?

## 2022-03-01 ENCOUNTER — Encounter: Payer: Self-pay | Admitting: *Deleted

## 2022-03-01 ENCOUNTER — Telehealth: Payer: Self-pay

## 2022-03-01 NOTE — Telephone Encounter (Signed)
PA for Oxycodone sent to insurance through CoverMyMeds 

## 2022-03-01 NOTE — Telephone Encounter (Signed)
PA Case: [id number], Status: Approved, Coverage Starts on: 03/01/2022 12:00:00 AM, Coverage Ends on: 08/28/2022 12:00:00 AM. ?

## 2022-03-04 ENCOUNTER — Telehealth: Payer: Self-pay | Admitting: *Deleted

## 2022-03-04 NOTE — Telephone Encounter (Signed)
[name] called and says he has been unable to get his xtampza ER and it needs a PA.  Prior auth submitted to [employer] via [employer]. ?

## 2022-03-04 NOTE — Telephone Encounter (Signed)
PA Case: [id number], Status: Approved, Coverage Starts on: 03/04/2022 12:00:00 AM, Coverage Ends on: 06/02/2022 12:00:00 AM. ?

## 2022-03-06 ENCOUNTER — Other Ambulatory Visit: Payer: Self-pay | Admitting: Physical Medicine and Rehabilitation

## 2022-03-22 ENCOUNTER — Ambulatory Visit: Payer: Medicaid Other | Admitting: Surgical

## 2022-03-22 DIAGNOSIS — M545 Low back pain, unspecified: Secondary | ICD-10-CM

## 2022-03-22 DIAGNOSIS — M5459 Other low back pain: Secondary | ICD-10-CM | POA: Diagnosis not present

## 2022-03-22 MED ORDER — PREDNISONE 10 MG (21) PO TBPK: ORAL_TABLET | ORAL | 0 refills | Status: DC

## 2022-03-25 ENCOUNTER — Telehealth: Payer: Self-pay

## 2022-03-25 NOTE — Telephone Encounter (Signed)
Filled  Written  ID  Drug  QTY  Days  Prescriber  RX #  Dispenser  Refill  Daily Dose*  Pymt Type  PMP  03/22/2022 03/22/2022 1  Alprazolam 0.5 Mg Tablet 15.00 30 Ma Ach [id number] Nor ([id number]) 0/0 0.50 LME Medicaid [place] 03/04/2022 02/28/2022 1  Xtampza Er 36 Mg Capsule 60.00 30 Me Lov [id number] Nor ([id number]) 0/0 120.00 MME Medicaid [place] 03/01/2022 02/28/2022 1  Oxycodone Hcl (Ir) 5 Mg Tablet 90.00 30 Me Lov [id number] Nor ([id number]) 0/0 22.50 MME Medicaid [place] 02/14/2022 02/14/2022 1  Phentermine 37.5 Mg Tablet 15.00 30 Ma Ach [id number] Nor ([id number]) 0/0  Private Pay [place]  Patient requesting Xtampza & Oxycodone refill

## 2022-03-27 MED ORDER — XTAMPZA ER 36 MG PO C12A: 36.0000 mg | EXTENDED_RELEASE_CAPSULE | Freq: Two times a day (BID) | ORAL | 0 refills | Status: DC

## 2022-03-27 MED ORDER — OXYCODONE HCL 5 MG PO TABS: 5.0000 mg | ORAL_TABLET | Freq: Three times a day (TID) | ORAL | 0 refills | Status: DC | PRN

## 2022-03-28 ENCOUNTER — Other Ambulatory Visit (HOSPITAL_COMMUNITY): Payer: Self-pay

## 2022-03-28 ENCOUNTER — Telehealth: Payer: Self-pay

## 2022-03-28 MED ORDER — OXYCODONE HCL 5 MG PO TABS
5.0000 mg | ORAL_TABLET | Freq: Three times a day (TID) | ORAL | 0 refills | Status: DC | PRN
  Filled 2022-03-28: qty 90, 30d supply, fill #0
  Filled 2022-03-29: qty 87, 29d supply, fill #0
  Filled 2022-03-29: qty 3, 1d supply, fill #0

## 2022-03-28 NOTE — Telephone Encounter (Signed)
Patient called and stated the [facility] [place] [facility] not have the Oxycodone 5 mg. Cancelled the prescription there. [facility] does have the medication. Notified patient and he stated that it is fine to send there.

## 2022-03-29 ENCOUNTER — Other Ambulatory Visit (HOSPITAL_COMMUNITY): Payer: Self-pay

## 2022-04-02 ENCOUNTER — Encounter: Payer: Self-pay | Admitting: Surgical
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
## 2022-04-07 ENCOUNTER — Ambulatory Visit
Admission: RE | Admit: 2022-04-07 | Discharge: 2022-04-07 | Disposition: A | Discharge status: Home / Self Care | Payer: Medicaid Other | Source: Ambulatory Visit | Attending: Surgical | Admitting: Surgical

## 2022-04-07 DIAGNOSIS — M545 Low back pain, unspecified: Secondary | ICD-10-CM

## 2022-04-09 ENCOUNTER — Other Ambulatory Visit: Payer: Medicaid Other

## 2022-04-10 ENCOUNTER — Other Ambulatory Visit: Payer: Self-pay | Admitting: Physical Medicine and Rehabilitation

## 2022-04-15 ENCOUNTER — Telehealth: Payer: Self-pay | Admitting: Orthopedic Surgery

## 2022-04-19 ENCOUNTER — Ambulatory Visit: Payer: Medicaid Other | Admitting: Orthopedic Surgery

## 2022-04-19 DIAGNOSIS — M545 Low back pain, unspecified: Secondary | ICD-10-CM

## 2022-04-22 ENCOUNTER — Telehealth: Payer: Self-pay

## 2022-04-22 NOTE — Telephone Encounter (Signed)
Patient called requesting refill. According to PMP Oxycodone #90 last filled 03/29/22 and Xtampza ER #60 last filled 04/02/22. I explained to patient that its too soon and will probably not be able to fill Oxycodone until Friday. He states he understands.

## 2022-04-24 ENCOUNTER — Encounter: Payer: Self-pay | Admitting: Orthopedic Surgery

## 2022-04-24 MED ORDER — XTAMPZA ER 36 MG PO C12A: 36.0000 mg | EXTENDED_RELEASE_CAPSULE | Freq: Two times a day (BID) | ORAL | 0 refills | Status: DC

## 2022-04-24 MED ORDER — OXYCODONE HCL 5 MG PO TABS: 5.0000 mg | ORAL_TABLET | Freq: Three times a day (TID) | ORAL | 0 refills | Status: DC | PRN
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
## 2022-04-26 ENCOUNTER — Ambulatory Visit
Admission: RE | Admit: 2022-04-26 | Discharge: 2022-04-26 | Disposition: A | Discharge status: Home / Self Care | Payer: Medicaid Other | Source: Ambulatory Visit | Attending: Orthopedic Surgery | Admitting: Orthopedic Surgery

## 2022-04-26 ENCOUNTER — Ambulatory Visit: Payer: Medicaid Other | Admitting: Surgical

## 2022-04-26 DIAGNOSIS — M545 Low back pain, unspecified: Secondary | ICD-10-CM

## 2022-04-29 ENCOUNTER — Ambulatory Visit: Payer: Self-pay | Admitting: Orthopedic Surgery

## 2022-05-02 ENCOUNTER — Other Ambulatory Visit: Payer: Medicaid Other

## 2022-05-03 ENCOUNTER — Ambulatory Visit: Payer: Medicaid Other | Admitting: Surgical

## 2022-05-03 DIAGNOSIS — M48061 Spinal stenosis, lumbar region without neurogenic claudication: Secondary | ICD-10-CM | POA: Diagnosis not present

## 2022-05-05 ENCOUNTER — Encounter: Payer: Self-pay | Admitting: Surgical
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
## 2022-05-13 ENCOUNTER — Other Ambulatory Visit: Payer: Self-pay

## 2022-05-13 DIAGNOSIS — M48061 Spinal stenosis, lumbar region without neurogenic claudication: Secondary | ICD-10-CM

## 2022-05-20 ENCOUNTER — Telehealth: Payer: Self-pay | Admitting: Orthopedic Surgery

## 2022-05-20 NOTE — Telephone Encounter (Signed)
Pt called and states his leg has gotten worse. He states the knee is now completely numb and its really worrying him. He was suppose to be sent to neurology but he wants to know does he need to come in here now?

## 2022-05-21 NOTE — Telephone Encounter (Signed)
Called and spoke with [name] yesterday evening. Gave him the phone number for [facility].  No red flag symptoms

## 2022-05-23 ENCOUNTER — Other Ambulatory Visit: Payer: Self-pay | Admitting: Physical Medicine and Rehabilitation

## 2022-05-28 ENCOUNTER — Telehealth: Payer: Self-pay

## 2022-05-28 NOTE — Telephone Encounter (Signed)
Patient is calling to request refill on Xtampza and Oxycodone. Per PMP, last fill was 04/26/22 for the Oxycodone and 05/01/22 for the [facility]

## 2022-05-29 MED ORDER — OXYCODONE HCL 5 MG PO TABS: 5.0000 mg | ORAL_TABLET | Freq: Three times a day (TID) | ORAL | 0 refills | Status: DC | PRN

## 2022-05-29 MED ORDER — XTAMPZA ER 36 MG PO C12A: 36.0000 mg | EXTENDED_RELEASE_CAPSULE | Freq: Two times a day (BID) | ORAL | 0 refills | Status: DC

## 2022-05-29 NOTE — Addendum Note (Signed)
Addended by: Courtney Heys on: 05/29/2022 10:23 AM   Modules accepted: Orders

## 2022-06-03 ENCOUNTER — Telehealth: Payer: Self-pay

## 2022-06-03 NOTE — Telephone Encounter (Signed)
PA Case: [id number], Status: Approved, Coverage Starts on: 06/03/2022 12:00:00 AM, Coverage Ends on: 06/03/2023 12:00:00 AM.

## 2022-06-03 NOTE — Telephone Encounter (Signed)
PA for Lidocaine patches sent to insurance through CoverMyMeds ?

## 2022-06-28 ENCOUNTER — Encounter: Payer: Medicaid Other | Attending: Registered Nurse | Admitting: Registered Nurse

## 2022-06-28 ENCOUNTER — Encounter: Payer: Self-pay | Admitting: Registered Nurse

## 2022-06-28 VITALS — BP 145/89 | HR 84 | Ht 73.0 in | Wt 321.2 lb

## 2022-06-28 DIAGNOSIS — M7918 Myalgia, other site: Secondary | ICD-10-CM | POA: Insufficient documentation

## 2022-06-28 DIAGNOSIS — Z79891 Long term (current) use of opiate analgesic: Secondary | ICD-10-CM | POA: Diagnosis present

## 2022-06-28 DIAGNOSIS — M5416 Radiculopathy, lumbar region: Secondary | ICD-10-CM | POA: Insufficient documentation

## 2022-06-28 DIAGNOSIS — G894 Chronic pain syndrome: Secondary | ICD-10-CM | POA: Insufficient documentation

## 2022-06-28 DIAGNOSIS — G8922 Chronic post-thoracotomy pain: Secondary | ICD-10-CM | POA: Insufficient documentation

## 2022-06-28 DIAGNOSIS — Z5181 Encounter for therapeutic drug level monitoring: Secondary | ICD-10-CM | POA: Insufficient documentation

## 2022-06-28 MED ORDER — XTAMPZA ER 36 MG PO C12A: 36.0000 mg | EXTENDED_RELEASE_CAPSULE | Freq: Two times a day (BID) | ORAL | 0 refills | Status: DC

## 2022-06-28 MED ORDER — OXYCODONE HCL 5 MG PO TABS: 5.0000 mg | ORAL_TABLET | Freq: Three times a day (TID) | ORAL | 0 refills | Status: DC | PRN

## 2022-06-28 NOTE — Progress Notes (Unsigned)
[name] is a 46 y.o. male who returns for follow up appointment for chronic pain and medication refill. states *** pain is located in  ***. rates pain ***. current exercise regime is walking and performing stretching exercises.     Pain Inventory Average Pain 8 Pain Right Now 8 My pain is sharp, burning, dull, stabbing, tingling, and aching  In the last 24 hours, has pain interfered with the following? General activity 7 Relation with others 7 Enjoyment of life 8 What TIME of day is your pain at its worst? morning , daytime, evening, and night Sleep (in general) Poor  Pain is worse with: walking, bending, sitting, inactivity, standing, and some activites Pain improves with: rest, medication, and injections Relief from Meds: 5  Family History  Problem Relation Age of Onset   Cirrhosis Father    Colitis Father    Heart disease Father    Asthma Father    Other Father        Chemical Imbalance   Heart attack Other        Paternal Grandparents   Stroke Other        Paternal Grandparents   Prostate cancer Paternal Grandfather    Diabetes Maternal Grandfather    Alcohol abuse Mother    Other Brother        Intestinal Fissure   Colon polyps Sister        intestinal problems   Asthma Son    Other Brother        Orthoptist   Social History   Socioeconomic History   Marital status: Divorced    Spouse name: Not on file   Number of children: 3   Years of education: Not on file   Highest education level: Not on file  Occupational History   Not on file  Tobacco Use   Smoking status: Every Day    Packs/day: 1.00    Years: 24.00    Total pack years: 24.00    Types: Cigarettes   Smokeless tobacco: Never  Vaping Use   Vaping Use: Never used  Substance and Sexual Activity   Alcohol use: Not Currently    Alcohol/week: 0.0 standard drinks of alcohol   Drug use: No   Sexual activity: Not on file  Other Topics Concern   Not on file  Social History Narrative    ** Merged History Encounter **          Right handed    Lives on 2nd floor   Social Determinants of Health   Financial Resource Strain: Not on file  Food Insecurity: Not on file  Transportation Needs: Not on file  Physical Activity: Not on file  Stress: Not on file  Social Connections: Not on file   Past Surgical History:  Procedure Laterality Date   COLONOSCOPY WITH PROPOFOL N/A 08/19/2013   Procedure: COLONOSCOPY WITH PROPOFOL;  Surgeon: Milus Banister, MD;  [facility];  Service: Endoscopy;  Laterality: N/A;   ESOPHAGOGASTRODUODENOSCOPY (EGD) WITH PROPOFOL N/A 08/19/2013   Procedure: ESOPHAGOGASTRODUODENOSCOPY (EGD) WITH PROPOFOL;  Surgeon: Milus Banister, MD;  [facility];  Service: Endoscopy;  Laterality: N/A;   IR RADIOLOGIST EVAL & MGMT  05/20/2019   KNEE ARTHROSCOPY Left 07/13/2020   Procedure: left knee arthroscopy, debridement, cyst decompression;  Surgeon: Meredith Pel, MD;  Location: [place];  Service: Orthopedics;  Laterality: Left;   ORIF MANDIBULAR FRACTURE N/A 02/16/2016   Procedure: OPEN REDUCTION INTERNAL FIXATION (ORIF) MANDIBULAR  FRACTURE;  Surgeon: Melissa Montane, MD;  Location: [place];  Service: ENT;  Laterality: N/A;   RIB PLATING Left 02/20/2016   Procedure: LEFT RIB PLATING;  Surgeon: Ivin Poot, MD;  Location: [place];  Service: Thoracic;  Laterality: Left;   ROBOTIC ASSITED PARTIAL NEPHRECTOMY Left 06/17/2013   Procedure: ROBOTIC ASSITED PARTIAL NEPHRECTOMY;  Surgeon: Dutch Gray, MD;  [facility];  Service: Urology;  Laterality: Left;   TRACHEOSTOMY TUBE PLACEMENT N/A 02/16/2016   Procedure: TRACHEOSTOMY;  Surgeon: Melissa Montane, MD;  Location: [facility];  Service: ENT;  Laterality: N/A;   VASECTOMY  2012   WISDOM TOOTH EXTRACTION  middle school   WRIST SURGERY Left middle school   "arteries and nerves tangled up"   Past Surgical History:  Procedure Laterality Date   COLONOSCOPY WITH PROPOFOL N/A 08/19/2013    Procedure: COLONOSCOPY WITH PROPOFOL;  Surgeon: Milus Banister, MD;  [facility];  Service: Endoscopy;  Laterality: N/A;   ESOPHAGOGASTRODUODENOSCOPY (EGD) WITH PROPOFOL N/A 08/19/2013   Procedure: ESOPHAGOGASTRODUODENOSCOPY (EGD) WITH PROPOFOL;  Surgeon: Milus Banister, MD;  [facility];  Service: Endoscopy;  Laterality: N/A;   IR RADIOLOGIST EVAL & MGMT  05/20/2019   KNEE ARTHROSCOPY Left 07/13/2020   Procedure: left knee arthroscopy, debridement, cyst decompression;  Surgeon: Meredith Pel, MD;  Location: [place];  Service: Orthopedics;  Laterality: Left;   ORIF MANDIBULAR FRACTURE N/A 02/16/2016   Procedure: OPEN REDUCTION INTERNAL FIXATION (ORIF) MANDIBULAR FRACTURE;  Surgeon: Melissa Montane, MD;  Location: [place];  Service: ENT;  Laterality: N/A;   RIB PLATING Left 02/20/2016   Procedure: LEFT RIB PLATING;  Surgeon: Ivin Poot, MD;  Location: [place];  Service: Thoracic;  Laterality: Left;   ROBOTIC ASSITED PARTIAL NEPHRECTOMY Left 06/17/2013   Procedure: ROBOTIC ASSITED PARTIAL NEPHRECTOMY;  Surgeon: Dutch Gray, MD;  [facility];  Service: Urology;  Laterality: Left;   TRACHEOSTOMY TUBE PLACEMENT N/A 02/16/2016   Procedure: TRACHEOSTOMY;  Surgeon: Melissa Montane, MD;  [facility];  Service: ENT;  Laterality: N/A;   VASECTOMY  2012   WISDOM TOOTH EXTRACTION  middle school   WRIST SURGERY Left middle school   "arteries and nerves tangled up"   Past Medical History:  Diagnosis Date   Anxiety and depression 08/01/2013   Cancer ([place])    Cellulitis    left leg and stomach   Chicken pox    Chronic pain    Diarrhea 08/01/2013   History of kidney cancer    Hx of vasectomy    Hypertension    Renal cell carcinoma ([place]) 06/01/2013   BP (!) 145/89   Pulse 84   Ht '6\' 1"'$  (1.854 m)   Wt (!) 321 lb 3.2 oz (145.7 kg)   SpO2 100%   BMI 42.38 kg/m   Opioid Risk Score:   Fall Risk Score:  `1  Depression screen PHQ 2/9     06/28/2022    2:49  PM 10/01/2021    2:49 PM 07/02/2021    2:26 PM 11/19/2019    9:11 AM 07/19/2019    3:02 PM 04/05/2019    9:54 AM 02/22/2019   10:33 AM  Depression screen PHQ 2/9  Decreased Interest 0 '1 1 2 2 '$ 0   Down, Depressed, Hopeless 0 '3 1 2 2 1   '$ PHQ - 2 Score 0 '4 2 4 4 1   '$ Altered sleeping    2 2 0   Tired, decreased energy  $'3 2 1   'X$ Change in appetite    2 1 0   Feeling bad or failure about yourself     2 2 0   Trouble concentrating    2 2 0   Moving slowly or fidgety/restless    2 2 0   Suicidal thoughts    0 0 0   PHQ-9 Score    '17 15 2   '$ Difficult doing work/chores    Very difficult Very difficult Somewhat difficult      Information is confidential and restricted. Go to Review Flowsheets to unlock data.

## 2022-07-01 ENCOUNTER — Encounter: Payer: Self-pay | Admitting: Registered Nurse

## 2022-07-01 ENCOUNTER — Telehealth: Payer: Self-pay | Admitting: *Deleted

## 2022-07-01 NOTE — Telephone Encounter (Signed)
Prior auth submitted to insurance via CoverMyMeds.

## 2022-07-01 NOTE — Progress Notes (Signed)
Subjective:    Patient ID: [name], male    DOB: December 27, 1975, 46 y.o.   MRN: [id number]  HPIHeath Leon is a 46 y.o. male who returns for follow up appointment for chronic pain and medication refill. He states his pain is located in his lower back radiating into his buttocks, Bilateral hips, bilateral lower extremities abd bilateral feet with tingling and burning. He rates his pain 8. His current exercise regime is walking and performing stretching exercises.  Spoke with [name] about keeping his scheduled appointments with Dr. Dagoberto Ligas, he verbalizes understanding. He also states his mother passed away, emotional support given.   [name] Morphine equivalent is 133.75 MME.   UDS ordered today.   Pain Inventory Average Pain 8 Pain Right Now 8 My pain is sharp, burning, dull, stabbing, tingling, and aching  In the last 24 hours, has pain interfered with the following? General activity 7 Relation with others 7 Enjoyment of life 8 What TIME of day is your pain at its worst? morning , daytime, evening, and night Sleep (in general) Poor  Pain is worse with: walking, bending, sitting, inactivity, standing, and some activites Pain improves with: rest, medication, and injections Relief from Meds: 5  Family History  Problem Relation Age of Onset   Cirrhosis Father    Colitis Father    Heart disease Father    Asthma Father    Other Father        Chemical Imbalance   Heart attack Other        Paternal Grandparents   Stroke Other        Paternal Grandparents   Prostate cancer Paternal Grandfather    Diabetes Maternal Grandfather    Alcohol abuse Mother    Other Brother        Intestinal Fissure   Colon polyps Sister        intestinal problems   Asthma Son    Other Brother        Orthoptist   Social History   Socioeconomic History   Marital status: Divorced    Spouse name: Not on file   Number of children: 3   Years of education: Not on file   Highest  education level: Not on file  Occupational History   Not on file  Tobacco Use   Smoking status: Every Day    Packs/day: 1.00    Years: 24.00    Total pack years: 24.00    Types: Cigarettes   Smokeless tobacco: Never  Vaping Use   Vaping Use: Never used  Substance and Sexual Activity   Alcohol use: Not Currently    Alcohol/week: 0.0 standard drinks of alcohol   Drug use: No   Sexual activity: Not on file  Other Topics Concern   Not on file  Social History Narrative   ** Merged History Encounter **          Right handed    Lives on 2nd floor   Social Determinants of Health   Financial Resource Strain: Not on file  Food Insecurity: Not on file  Transportation Needs: Not on file  Physical Activity: Not on file  Stress: Not on file  Social Connections: Not on file   Past Surgical History:  Procedure Laterality Date   COLONOSCOPY WITH PROPOFOL N/A 08/19/2013   Procedure: COLONOSCOPY WITH PROPOFOL;  Surgeon: Milus Banister, MD;  [facility];  Service: Endoscopy;  Laterality: N/A;   ESOPHAGOGASTRODUODENOSCOPY (EGD) WITH PROPOFOL N/A 08/19/2013  Procedure: ESOPHAGOGASTRODUODENOSCOPY (EGD) WITH PROPOFOL;  Surgeon: Milus Banister, MD;  [facility];  Service: Endoscopy;  Laterality: N/A;   IR RADIOLOGIST EVAL & MGMT  05/20/2019   KNEE ARTHROSCOPY Left 07/13/2020   Procedure: left knee arthroscopy, debridement, cyst decompression;  Surgeon: Meredith Pel, MD;  Location: [place];  Service: Orthopedics;  Laterality: Left;   ORIF MANDIBULAR FRACTURE N/A 02/16/2016   Procedure: OPEN REDUCTION INTERNAL FIXATION (ORIF) MANDIBULAR FRACTURE;  Surgeon: Melissa Montane, MD;  Location: [place];  Service: ENT;  Laterality: N/A;   RIB PLATING Left 02/20/2016   Procedure: LEFT RIB PLATING;  Surgeon: Ivin Poot, MD;  Location: [place];  Service: Thoracic;  Laterality: Left;   ROBOTIC ASSITED PARTIAL NEPHRECTOMY Left 06/17/2013   Procedure: ROBOTIC ASSITED  PARTIAL NEPHRECTOMY;  Surgeon: Dutch Gray, MD;  [facility];  Service: Urology;  Laterality: Left;   TRACHEOSTOMY TUBE PLACEMENT N/A 02/16/2016   Procedure: TRACHEOSTOMY;  Surgeon: Melissa Montane, MD;  Location[facility];  Service: ENT;  Laterality: N/A;   VASECTOMY  2012   WISDOM TOOTH EXTRACTION  middle school   WRIST SURGERY Left middle school   "arteries and nerves tangled up"   Past Surgical History:  Procedure Laterality Date   COLONOSCOPY WITH PROPOFOL N/A 08/19/2013   Procedure: COLONOSCOPY WITH PROPOFOL;  Surgeon: Milus Banister, MD;  [facility];  Service: Endoscopy;  Laterality: N/A;   ESOPHAGOGASTRODUODENOSCOPY (EGD) WITH PROPOFOL N/A 08/19/2013   Procedure: ESOPHAGOGASTRODUODENOSCOPY (EGD) WITH PROPOFOL;  Surgeon: Milus Banister, MD;  [facility];  Service: Endoscopy;  Laterality: N/A;   IR RADIOLOGIST EVAL & MGMT  05/20/2019   KNEE ARTHROSCOPY Left 07/13/2020   Procedure: left knee arthroscopy, debridement, cyst decompression;  Surgeon: Meredith Pel, MD;  Location: [place];  Service: Orthopedics;  Laterality: Left;   ORIF MANDIBULAR FRACTURE N/A 02/16/2016   Procedure: OPEN REDUCTION INTERNAL FIXATION (ORIF) MANDIBULAR FRACTURE;  Surgeon: Melissa Montane, MD;  Location: [place];  Service: ENT;  Laterality: N/A;   RIB PLATING Left 02/20/2016   Procedure: LEFT RIB PLATING;  Surgeon: Ivin Poot, MD;  Location: [place];  Service: Thoracic;  Laterality: Left;   ROBOTIC ASSITED PARTIAL NEPHRECTOMY Left 06/17/2013   Procedure: ROBOTIC ASSITED PARTIAL NEPHRECTOMY;  Surgeon: Dutch Gray, MD;  [facility];  Service: Urology;  Laterality: Left;   TRACHEOSTOMY TUBE PLACEMENT N/A 02/16/2016   Procedure: TRACHEOSTOMY;  Surgeon: Melissa Montane, MD;  Location: [facility] OR;  Service: ENT;  Laterality: N/A;   VASECTOMY  2012   WISDOM TOOTH EXTRACTION  middle school   WRIST SURGERY Left middle school   "arteries and nerves tangled up"   Past Medical History:   Diagnosis Date   Anxiety and depression 08/01/2013   Cancer ([place])    Cellulitis    left leg and stomach   Chicken pox    Chronic pain    Diarrhea 08/01/2013   History of kidney cancer    Hx of vasectomy    Hypertension    Renal cell carcinoma ([place]) 06/01/2013   BP (!) 145/89   Pulse 84   Ht '6\' 1"'$  (1.854 m)   Wt (!) 321 lb 3.2 oz (145.7 kg)   SpO2 100%   BMI 42.38 kg/m   Opioid Risk Score:   Fall Risk Score:  `1  Depression screen [facility] 2/9     06/28/2022    2:49 PM 10/01/2021    2:49 PM 07/02/2021  2:26 PM 11/19/2019    9:11 AM 07/19/2019    3:02 PM 04/05/2019    9:54 AM 02/22/2019   10:33 AM  Depression screen PHQ 2/9  Decreased Interest 0 '1 1 2 2 '$ 0   Down, Depressed, Hopeless 0 '3 1 2 2 1   '$ PHQ - 2 Score 0 '4 2 4 4 1   '$ Altered sleeping    2 2 0   Tired, decreased energy    '3 2 1   '$ Change in appetite    2 1 0   Feeling bad or failure about yourself     2 2 0   Trouble concentrating    2 2 0   Moving slowly or fidgety/restless    2 2 0   Suicidal thoughts    0 0 0   PHQ-9 Score    '17 15 2   '$ Difficult doing work/chores    Very difficult Very difficult Somewhat difficult      Information is confidential and restricted. Go to Review Flowsheets to unlock data.     Review of Systems  Constitutional: Negative.   HENT: Negative.    Eyes: Negative.   Respiratory: Negative.    Cardiovascular: Negative.   Gastrointestinal: Negative.   Endocrine: Negative.   Genitourinary: Negative.   Musculoskeletal:  Positive for back pain.  Skin: Negative.   Allergic/Immunologic: Negative.   Neurological: Negative.   Hematological: Negative.   Psychiatric/Behavioral: Negative.    All other systems reviewed and are negative.      Objective:   Physical Exam Vitals and nursing note reviewed.  Constitutional:      Appearance: Normal appearance.  Cardiovascular:     Rate and Rhythm: Normal rate and regular rhythm.     Pulses: Normal pulses.     Heart sounds: Normal heart  sounds.  Pulmonary:     Effort: Pulmonary effort is normal.     Breath sounds: Normal breath sounds.  Musculoskeletal:     Cervical back: Normal range of motion and neck supple.     Comments: Normal Muscle Bulk and Muscle Testing Reveals:  Upper Extremities: Full ROM and Muscle Strength 5/5 Thoracic Paraspinal Tenderness: T-7-T-9 Lumbar Paraspinal Tenderness: L-4-L-5 Bilateral Greater Trochanter Tenderness Lower Extremities: Full ROM and Muscle Strength 5/5 Arises from Table slowly Antalgic  Gait     Skin:    General: Skin is warm and dry.  Neurological:     Mental Status: He is alert and oriented to person, place, and time.  Psychiatric:        Mood and Affect: Mood normal.        Behavior: Behavior normal.         Assessment & Plan:  Lumbar Radiculitis: Continue Gabapentin. Continue HEP as Tolerated . Continue to Monitor.  2. Chronic Post Thoracotomy Pain: Continue current medication regimen. Continue to monitor.  3. Chronic Pain Syndrome: Refilled: Xtampza 36 mg every 12 hours #60 and Oxycodone '5mg'$  three times a day as needed for pain #90. We will continue the opioid monitoring program, this consists of regular clinic visits, examinations, urine drug screen, pill counts as well as use of [place] Controlled Substance Reporting system. A 12 month History has been reviewed on the [place] Controlled Substance Reporting System on 06/28/2022.  3. Myofascial Pain: Continue current medication regimen. Continue to Monitor.  4. Chronic Post Thoracotomy Pain: Continue current medication regimen. Continue to Monitor.   F/U with Dr Dagoberto Ligas

## 2022-07-02 NOTE — Telephone Encounter (Signed)
PA Case: [id number], Status: Approved, Coverage Starts on: 07/01/2022 12:00:00 AM, Coverage Ends on: 09/29/2022 12:00:00 AM.

## 2022-07-03 LAB — TOXASSURE SELECT,+ANTIDEPR,UR

## 2022-07-05 ENCOUNTER — Telehealth: Payer: Self-pay | Admitting: *Deleted

## 2022-07-05 NOTE — Telephone Encounter (Signed)
Urine drug screen for this encounter is consistent for prescribed medication 

## 2022-08-01 ENCOUNTER — Telehealth: Payer: Self-pay | Admitting: *Deleted

## 2022-08-01 MED ORDER — XTAMPZA ER 36 MG PO C12A: 36.0000 mg | EXTENDED_RELEASE_CAPSULE | Freq: Two times a day (BID) | ORAL | 0 refills | Status: DC

## 2022-08-01 MED ORDER — OXYCODONE HCL 5 MG PO TABS: 5.0000 mg | ORAL_TABLET | Freq: Three times a day (TID) | ORAL | 0 refills | Status: DC | PRN

## 2022-08-01 NOTE — Telephone Encounter (Signed)
Mr Skop called for refill on his oxycodone 5 mg and xtampza 36 mg.Last filled per PMP  07/02/22. Per PMP it looks like he got codeine cough syrup on 9/22.

## 2022-08-01 NOTE — Telephone Encounter (Signed)
PMP was Reviewed.  He was prescribed Guaifen/Codeine, call placed to [name], no answer. Left message to return the call. Xtampza and Oxycodone e-scribed to pharmacy.

## 2022-08-29 ENCOUNTER — Other Ambulatory Visit: Payer: Self-pay

## 2022-08-29 MED ORDER — OXYCODONE HCL 5 MG PO TABS: 5.0000 mg | ORAL_TABLET | Freq: Three times a day (TID) | ORAL | 0 refills | Status: DC | PRN

## 2022-08-29 MED ORDER — XTAMPZA ER 36 MG PO C12A: 36.0000 mg | EXTENDED_RELEASE_CAPSULE | Freq: Two times a day (BID) | ORAL | 0 refills | Status: DC

## 2022-08-29 NOTE — Telephone Encounter (Signed)
Patient called for Rx refills  0/30/2023 08/19/2022 1  Phentermine 37.5 Mg Tablet 15.00 30 Ma Ach [id number] Nor ([id number]) 0/0  Private Pay [place] 08/01/2022 08/01/2022 1  Xtampza Er 36 Mg Capsule 60.00 30 Eu Tho [id number] Nor ([id number]) 0/0 120.00 MME Medicaid [place] 08/01/2022 08/01/2022 1  Oxycodone Hcl (Ir) 5 Mg Tablet 90.00 30 Eu Tho [id number] Nor ([id number]) 0/0 22.50 MME Medicaid [place]

## 2022-09-02 ENCOUNTER — Telehealth: Payer: Self-pay

## 2022-09-02 NOTE — Telephone Encounter (Signed)
PA Started for Oxycodone '5MG'$ 

## 2022-09-03 NOTE — Telephone Encounter (Signed)
Oxycodone HCI 5 MG approved 09/02/2022-02/28/2022.

## 2022-09-04 ENCOUNTER — Telehealth: Payer: Self-pay

## 2022-09-04 NOTE — Telephone Encounter (Signed)
Xtampza ER denied. Pt has new insurance.

## 2022-09-11 MED ORDER — MORPHINE SULFATE ER 30 MG PO TBCR
30.0000 mg | EXTENDED_RELEASE_TABLET | Freq: Two times a day (BID) | ORAL | 0 refills | Status: DC
Start: 2022-09-11 — End: 2022-09-16

## 2022-09-16 ENCOUNTER — Encounter: Payer: Medicaid Other | Attending: Registered Nurse | Admitting: Physical Medicine and Rehabilitation

## 2022-09-16 ENCOUNTER — Encounter: Payer: Self-pay | Admitting: Physical Medicine and Rehabilitation

## 2022-09-16 VITALS — BP 138/90 | HR 99 | Ht 73.0 in | Wt 303.0 lb

## 2022-09-16 DIAGNOSIS — G894 Chronic pain syndrome: Secondary | ICD-10-CM | POA: Insufficient documentation

## 2022-09-16 DIAGNOSIS — Z79891 Long term (current) use of opiate analgesic: Secondary | ICD-10-CM | POA: Insufficient documentation

## 2022-09-16 DIAGNOSIS — Z5181 Encounter for therapeutic drug level monitoring: Secondary | ICD-10-CM | POA: Insufficient documentation

## 2022-09-16 MED ORDER — DICLOFENAC SODIUM 75 MG PO TBEC: 75.0000 mg | DELAYED_RELEASE_TABLET | Freq: Two times a day (BID) | ORAL | 5 refills | Status: DC

## 2022-09-16 MED ORDER — OXYCODONE HCL 5 MG PO TABS: 5.0000 mg | ORAL_TABLET | Freq: Three times a day (TID) | ORAL | 0 refills | Status: DC | PRN

## 2022-09-16 MED ORDER — MORPHINE SULFATE ER 30 MG PO TBCR: 30.0000 mg | EXTENDED_RELEASE_TABLET | Freq: Two times a day (BID) | ORAL | 0 refills | Status: DC
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
## 2022-09-19 LAB — TOXASSURE SELECT,+ANTIDEPR,UR

## 2022-11-04 ENCOUNTER — Telehealth: Payer: Self-pay

## 2022-11-04 NOTE — Telephone Encounter (Signed)
Filled  Written  ID  Drug  QTY  Days  Prescriber  RX #  Dispenser  Refill  Daily Dose*  Pymt Type  PMP  10/10/2022 03/06/2022 1  Gabapentin 300 Mg Capsule 270.00 30 Me Lov [id number] Nor ([id number]) 3/5  Private Pay [facility] 10/10/2022 09/16/2022 1  Morphine Sulf Er 30 Mg Tablet 60.00 30 Me Lov [id number] Nor ([id number]) 0/0 60.00 MME Private Pay [place] 09/30/2022 09/16/2022 1  Oxycodone Hcl (Ir) 5 Mg Tablet 90.00 30 Me Lov [id number] Nor ([id number]) 0/0 22.50 MME Private Pay [place]

## 2022-11-05 MED ORDER — OXYCODONE HCL 5 MG PO TABS: 5.0000 mg | ORAL_TABLET | Freq: Three times a day (TID) | ORAL | 0 refills | Status: DC | PRN

## 2022-11-05 MED ORDER — MORPHINE SULFATE ER 30 MG PO TBCR: 30.0000 mg | EXTENDED_RELEASE_TABLET | Freq: Two times a day (BID) | ORAL | 0 refills | Status: DC

## 2022-11-08 ENCOUNTER — Telehealth: Payer: Self-pay

## 2022-11-08 NOTE — Telephone Encounter (Signed)
Request for Morphine refill  Filled  Written  ID  Drug  QTY  Days  Prescriber  RX #  Dispenser  Refill  Daily Dose*  Pymt Type  PMP  11/05/2022 11/05/2022 1  Oxycodone Hcl (Ir) 5 Mg Tablet 90.00 30 Me Lov [id number] Nor ([id number]) 0/0 22.50 MME Private Pay [facility] 10/10/2022 03/06/2022 1  Gabapentin 300 Mg Capsule 270.00 30 Me Lov [id number] Nor ([id number]) 3/5  Private Pay [facility] 10/10/2022 09/16/2022 1  Morphine Sulf Er 30 Mg Tablet 60.00 30 Me Lov [id number] Nor ([id number]) 0/0 60.00 MME Private Pay [place]

## 2022-11-11 MED ORDER — MORPHINE SULFATE ER 30 MG PO TBCR: 30.0000 mg | EXTENDED_RELEASE_TABLET | Freq: Two times a day (BID) | ORAL | 0 refills | Status: DC

## 2022-11-11 NOTE — Addendum Note (Signed)
Addended by: Courtney Heys on: 11/11/2022 03:48 PM   Modules accepted: Orders

## 2022-11-11 NOTE — Telephone Encounter (Signed)
I called the [facility] in [place] and was told the last Rx for Morphine was received on 10/10/2022.

## 2022-12-05 ENCOUNTER — Telehealth: Payer: Self-pay

## 2022-12-05 MED ORDER — MORPHINE SULFATE ER 30 MG PO TBCR: 30.0000 mg | EXTENDED_RELEASE_TABLET | Freq: Two times a day (BID) | ORAL | 0 refills | Status: DC

## 2022-12-05 MED ORDER — OXYCODONE HCL 5 MG PO TABS: 5.0000 mg | ORAL_TABLET | Freq: Three times a day (TID) | ORAL | 0 refills | Status: DC | PRN

## 2022-12-09 ENCOUNTER — Telehealth: Payer: Self-pay

## 2022-12-09 MED ORDER — OXYCODONE HCL 5 MG PO TABS: 5.0000 mg | ORAL_TABLET | Freq: Three times a day (TID) | ORAL | 0 refills | Status: DC | PRN

## 2022-12-09 NOTE — Telephone Encounter (Signed)
Dr. Dagoberto Ligas is out of the office. Zella Ball is out today. Will you please send in the a new Oxycodone 5 MG for [name].  He only received the 5 day supply on 12/07/2022.  Filled  Written  ID  Drug  QTY  Days  Prescriber  RX #  Dispenser  Refill  Daily Dose*  Pymt Type  PMP  12/07/2022 12/05/2022 1  Oxycodone Hcl (Ir) 5 Mg Tablet 15.00 5 Me Lov [id number] Nor ([id number]) 0/0 22.50 MME Medicaid [place] 12/04/2022 03/06/2022 1  Gabapentin 300 Mg Capsule 270.00 30 Me Lov [id number] Nor ([id number]) 4/5  Private Pay [facility] 11/11/2022 11/11/2022 1  Morphine Sulf Er 30 Mg Tablet 60.00 30 Me Lov [id number] Nor ([id number]) 0/0 60.00 MME Private Pay [place]

## 2022-12-09 NOTE — Telephone Encounter (Signed)
New rx sent in.  thx

## 2022-12-09 NOTE — Telephone Encounter (Signed)
New rx submitted

## 2022-12-10 NOTE — Telephone Encounter (Signed)
PA SUBMITTED  IN COVER MY MEDS FOR OXYCODONE 5 MG ON 12/10/2022.

## 2022-12-10 NOTE — Telephone Encounter (Signed)
  PRINT/DOWNLOADRENEWDELETE  NOTES  REMINDERS VIEW BY YOU less than a minute ago RESPONSE RECEIVED - APPROVED about 3 hours ago VIEW BY YOU about 4 hours ago REQUEST SENT about 4 hours ago View All Note Reminders 1 business day  Set Reminder ADD NOTE  REQUESTS  CASES  ACCOUNT  LOGOUT You're using the new request view. View request in the original view.  [name] (KeyValente David) PA Case ID #: [id number] Rx #: [id number] Need Help? Call us at [phone] Outcome Approved today PA Case: [id number], Status: Approved, Coverage Starts on: 12/10/2022 12:00:00 AM, Coverage Ends on: 06/08/2023 12:00:00 AM.

## 2022-12-12 ENCOUNTER — Telehealth: Payer: Self-pay | Admitting: *Deleted

## 2022-12-12 ENCOUNTER — Other Ambulatory Visit: Payer: Self-pay | Admitting: Physical Medicine and Rehabilitation

## 2022-12-12 MED ORDER — MORPHINE SULFATE ER 30 MG PO TBCR: 30.0000 mg | EXTENDED_RELEASE_TABLET | Freq: Two times a day (BID) | ORAL | 0 refills | Status: DC
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
## 2022-12-12 NOTE — Telephone Encounter (Signed)
Lovorn patient requesting refill on Morphine 30 mg #60.     Dose: 30 mg Route: Oral Frequency: Every 12 hours  Dispense Quantity: 60 tablet Refills: 0        Sig: Take 1 tablet (30 mg total) by mouth every 12 (twelve) hours. Due to be refilled 10/09/22       Start Date: 12/05/22 End Date: --  Written Date: 12/05/22 Expiration Date: 06/03/23  Earliest Fill Date: 12/05/22

## 2022-12-13 ENCOUNTER — Telehealth: Payer: Self-pay | Admitting: *Deleted

## 2022-12-13 NOTE — Telephone Encounter (Signed)
Nancy Lizardi (KeyBurley Saver) - [id number] Morphine Sulfate ER '30MG'$  er tablets Status: Question ResponseCreated: February 22nd, 2024 [phone]

## 2022-12-13 NOTE — Telephone Encounter (Signed)
[name] (KeyVirgel Paling) - [id number] Morphine Sulfate ER '30MG'$  er tablets Status: PA Response - DeniedCreated: February 23rd, 2024Sent: February 23rd, 2024

## 2022-12-17 ENCOUNTER — Telehealth: Payer: Self-pay | Admitting: *Deleted

## 2022-12-17 MED ORDER — XTAMPZA ER 36 MG PO C12A: 36.0000 mg | EXTENDED_RELEASE_CAPSULE | Freq: Two times a day (BID) | ORAL | 0 refills | Status: DC

## 2022-12-17 NOTE — Addendum Note (Signed)
Addended by: Courtney Heys on: 12/17/2022 02:47 PM   Modules accepted: Orders

## 2022-12-17 NOTE — Telephone Encounter (Signed)
done 

## 2022-12-17 NOTE — Telephone Encounter (Signed)
Prior auth for [employer] ER 36 mg submitted to insurance via CoverMyMeds.

## 2022-12-18 NOTE — Telephone Encounter (Signed)
PA Case: [id number], Status: Approved, Coverage Starts on: 12/17/2022 12:00:00 AM, Coverage Ends on: 03/17/2023 12:00:00 AM.

## 2022-12-25 ENCOUNTER — Encounter: Payer: Medicaid Other | Admitting: Physical Medicine and Rehabilitation

## 2023-01-09 ENCOUNTER — Telehealth: Payer: Self-pay | Admitting: *Deleted

## 2023-01-09 NOTE — Telephone Encounter (Signed)
PA for Morphine Sulfated ER 30 mg has been denied. Reason: because we did not see certain details about your use and treatment. We may consider continued approval of this drug in a certain situation that the patient has not or cannot use of a short acting first and or the reason for exceeding duration (7 days supply) limit. We did not see records that show this applies to you. We base this decision on your health's plans prior authorization criteria named [employer].

## 2023-01-10 ENCOUNTER — Telehealth: Payer: Self-pay | Admitting: *Deleted

## 2023-01-10 ENCOUNTER — Encounter: Payer: Medicaid Other | Attending: Physical Medicine and Rehabilitation | Admitting: Registered Nurse

## 2023-01-10 VITALS — BP 134/81 | HR 95 | Ht 73.0 in | Wt 335.0 lb

## 2023-01-10 DIAGNOSIS — Z5181 Encounter for therapeutic drug level monitoring: Secondary | ICD-10-CM | POA: Insufficient documentation

## 2023-01-10 DIAGNOSIS — M5416 Radiculopathy, lumbar region: Secondary | ICD-10-CM | POA: Diagnosis not present

## 2023-01-10 DIAGNOSIS — G894 Chronic pain syndrome: Secondary | ICD-10-CM | POA: Insufficient documentation

## 2023-01-10 DIAGNOSIS — M7918 Myalgia, other site: Secondary | ICD-10-CM | POA: Insufficient documentation

## 2023-01-10 DIAGNOSIS — G8922 Chronic post-thoracotomy pain: Secondary | ICD-10-CM | POA: Diagnosis present

## 2023-01-10 DIAGNOSIS — Z79891 Long term (current) use of opiate analgesic: Secondary | ICD-10-CM | POA: Diagnosis not present

## 2023-01-10 MED ORDER — OXYCODONE HCL 5 MG PO TABS: 5.0000 mg | ORAL_TABLET | Freq: Three times a day (TID) | ORAL | 0 refills | Status: DC | PRN

## 2023-01-10 MED ORDER — XTAMPZA ER 36 MG PO C12A: 36.0000 mg | EXTENDED_RELEASE_CAPSULE | Freq: Two times a day (BID) | ORAL | 0 refills | Status: DC

## 2023-01-10 NOTE — Telephone Encounter (Signed)
PA for Morphine ER done via covermymeds. (Key: [id number]) - [id number]

## 2023-01-10 NOTE — Progress Notes (Signed)
Subjective:    Patient ID: [name], male    DOB: 16-Sep-1976, 47 y.o.   MRN: [id number]  [id number] [name] is a 47 y.o. male who returns for follow up appointment for chronic pain and medication refill. He states his pain is located in his lower back radiating into his left hip pain and radiating into his left lower extremity. He rates his pain 8. His current exercise regime is walking and performing stretching exercises.  [name] Morphine equivalent is 142.50  MME.   UDS ordered Today    [name] states he is working at [employer].   Pain Inventory Average Pain 8 Pain Right Now 8 My pain is sharp, burning, dull, stabbing, tingling, and aching  In the last 24 hours, has pain interfered with the following? General activity 6 Relation with others 6 Enjoyment of life 6 What TIME of day is your pain at its worst? morning , daytime, evening, and night Sleep (in general) Poor  Pain is worse with: walking, bending, sitting, and standing Pain improves with: medication and injections Relief from Meds: 9  Family History  Problem Relation Age of Onset   Alcohol abuse Mother    Cirrhosis Father    Colitis Father    Heart disease Father    Asthma Father    Other Father        Chemical Imbalance   Colon polyps Sister        intestinal problems   Other Brother        Intestinal Fissure   Other Brother        Chemical Imbalance   Diabetes Maternal Grandfather    Prostate cancer Paternal Grandfather    Asthma Son    Heart attack Other        Paternal Grandparents   Stroke Other        Paternal Grandparents   Social History   Socioeconomic History   Marital status: Divorced    Spouse name: Not on file   Number of children: 3   Years of education: Not on file   Highest education level: Not on file  Occupational History   Not on file  Tobacco Use   Smoking status: Every Day    Packs/day: 1.00    Years: 24.00    Additional pack years: 0.00    Total pack years:  24.00    Types: Cigarettes   Smokeless tobacco: Never  Vaping Use   Vaping Use: Never used  Substance and Sexual Activity   Alcohol use: Not Currently    Alcohol/week: 0.0 standard drinks of alcohol   Drug use: No   Sexual activity: Not on file  Other Topics Concern   Not on file  Social History Narrative   ** Merged History Encounter **          Right handed    Lives on 2nd floor   Social Determinants of Health   Financial Resource Strain: Not on file  Food Insecurity: Not on file  Transportation Needs: Not on file  Physical Activity: Not on file  Stress: Not on file  Social Connections: Not on file   Past Surgical History:  Procedure Laterality Date   COLONOSCOPY WITH PROPOFOL N/A 08/19/2013   Procedure: COLONOSCOPY WITH PROPOFOL;  Surgeon: Milus Banister, MD;  Location: [facility];  Service: Endoscopy;  Laterality: N/A;   ESOPHAGOGASTRODUODENOSCOPY (EGD) WITH PROPOFOL N/A 08/19/2013   Procedure: ESOPHAGOGASTRODUODENOSCOPY (EGD) WITH PROPOFOL;  Surgeon: Milus Banister, MD;  Location:  [facility];  Service: Endoscopy;  Laterality: N/A;   IR RADIOLOGIST EVAL & MGMT  05/20/2019   KNEE ARTHROSCOPY Left 07/13/2020   Procedure: left knee arthroscopy, debridement, cyst decompression;  Surgeon: Meredith Pel, MD;  Location: [place];  Service: Orthopedics;  Laterality: Left;   ORIF MANDIBULAR FRACTURE N/A 02/16/2016   Procedure: OPEN REDUCTION INTERNAL FIXATION (ORIF) MANDIBULAR FRACTURE;  Surgeon: Melissa Montane, MD;  Location: [place];  Service: ENT;  Laterality: N/A;   RIB PLATING Left 02/20/2016   Procedure: LEFT RIB PLATING;  Surgeon: Ivin Poot, MD;  Location: [place];  Service: Thoracic;  Laterality: Left;   ROBOTIC ASSITED PARTIAL NEPHRECTOMY Left 06/17/2013   Procedure: ROBOTIC ASSITED PARTIAL NEPHRECTOMY;  Surgeon: Dutch Gray, MD;  Location: [facility];  Service: Urology;  Laterality: Left;   TRACHEOSTOMY TUBE PLACEMENT N/A 02/16/2016   Procedure:  TRACHEOSTOMY;  Surgeon: Melissa Montane, MD;  Location: [facility];  Service: ENT;  Laterality: N/A;   VASECTOMY  2012   WISDOM TOOTH EXTRACTION  middle school   WRIST SURGERY Left middle school   "arteries and nerves tangled up"   Past Surgical History:  Procedure Laterality Date   COLONOSCOPY WITH PROPOFOL N/A 08/19/2013   Procedure: COLONOSCOPY WITH PROPOFOL;  Surgeon: Milus Banister, MD;  [facility];  Service: Endoscopy;  Laterality: N/A;   ESOPHAGOGASTRODUODENOSCOPY (EGD) WITH PROPOFOL N/A 08/19/2013   Procedure: ESOPHAGOGASTRODUODENOSCOPY (EGD) WITH PROPOFOL;  Surgeon: Milus Banister, MD;  [facility];  Service: Endoscopy;  Laterality: N/A;   IR RADIOLOGIST EVAL & MGMT  05/20/2019   KNEE ARTHROSCOPY Left 07/13/2020   Procedure: left knee arthroscopy, debridement, cyst decompression;  Surgeon: Meredith Pel, MD;  Location: [place];  Service: Orthopedics;  Laterality: Left;   ORIF MANDIBULAR FRACTURE N/A 02/16/2016   Procedure: OPEN REDUCTION INTERNAL FIXATION (ORIF) MANDIBULAR FRACTURE;  Surgeon: Melissa Montane, MD;  Location: [place];  Service: ENT;  Laterality: N/A;   RIB PLATING Left 02/20/2016   Procedure: LEFT RIB PLATING;  Surgeon: Ivin Poot, MD;  Location: [place];  Service: Thoracic;  Laterality: Left;   ROBOTIC ASSITED PARTIAL NEPHRECTOMY Left 06/17/2013   Procedure: ROBOTIC ASSITED PARTIAL NEPHRECTOMY;  Surgeon: Dutch Gray, MD;  [facility];  Service: Urology;  Laterality: Left;   TRACHEOSTOMY TUBE PLACEMENT N/A 02/16/2016   Procedure: TRACHEOSTOMY;  Surgeon: Melissa Montane, MD;  Location: [facility];  Service: ENT;  Laterality: N/A;   VASECTOMY  2012   WISDOM TOOTH EXTRACTION  middle school   WRIST SURGERY Left middle school   "arteries and nerves tangled up"   Past Medical History:  Diagnosis Date   Anxiety and depression 08/01/2013   Cancer ([place])    Cellulitis    left leg and stomach   Chicken pox    Chronic pain    Diarrhea 08/01/2013    History of kidney cancer    Hx of vasectomy    Hypertension    Renal cell carcinoma ([place]) 06/01/2013   BP 134/81   Pulse 95   Ht 6\' 1"  (1.854 m)   Wt (!) 335 lb (152 kg)   SpO2 94%   BMI 44.20 kg/m   Opioid Risk Score:   Fall Risk Score:  `1  Depression screen PHQ 2/9     09/16/2022    1:51 PM 06/28/2022    2:49 PM 10/01/2021    2:49 PM 07/02/2021    2:26 PM 11/19/2019    9:11 AM 07/19/2019  3:02 PM 04/05/2019    9:54 AM  Depression screen PHQ 2/9  Decreased Interest 0 0 1 1 2 2  0  Down, Depressed, Hopeless 0 0 3 1 2 2 1   PHQ - 2 Score 0 0 4 2 4 4 1   Altered sleeping     2 2 0  Tired, decreased energy     3 2 1   Change in appetite     2 1 0  Feeling bad or failure about yourself      2 2 0  Trouble concentrating     2 2 0  Moving slowly or fidgety/restless     2 2 0  Suicidal thoughts     0 0 0  PHQ-9 Score     17 15 2   Difficult doing work/chores     Very difficult Very difficult Somewhat difficult    Review of Systems  Musculoskeletal:  Positive for back pain.       Lt side, leg foot pain  All other systems reviewed and are negative.      Objective:   Physical Exam Vitals and nursing note reviewed.  Constitutional:      Appearance: Normal appearance.  Cardiovascular:     Rate and Rhythm: Normal rate and regular rhythm.     Pulses: Normal pulses.     Heart sounds: Normal heart sounds.  Pulmonary:     Effort: Pulmonary effort is normal.     Breath sounds: Normal breath sounds.  Musculoskeletal:     Cervical back: Normal range of motion and neck supple.     Comments: Normal Muscle Bulk and Muscle Testing Reveals:  Upper Extremities: Full ROM and Muscle Strength 5/5 Thoracic Paraspinal Tenderness: T-7-T-9  Lumbar Paraspinal Tenderness: L-3-L-5 Left Greater Trochanter Tenderness Lower Extremities: Full ROM and Muscle Strength 5/5 Arises from Table with ease Narrow Based  Gait     Skin:    General: Skin is warm and dry.  Neurological:     Mental  Status: He is alert and oriented to person, place, and time.  Psychiatric:        Mood and Affect: Mood normal.        Behavior: Behavior normal.         Assessment & Plan:  Lumbar Radiculitis: Continue Gabapentin. Continue HEP as Tolerated . Continue to Monitor. 01/10/2023 2. Chronic Post Thoracotomy Pain: Continue current medication regimen. Continue to monitor. 01/10/2023 3. Chronic Pain Syndrome: Refilled: Xtampza 36 mg every 12 hours #60 and Oxycodone 5mg  three times a day as needed for pain #90. We will continue the opioid monitoring program, this consists of regular clinic visits, examinations, urine drug screen, pill counts as well as use of [place] Controlled Substance Reporting system. A 12 month History has been reviewed on the [place] on 01/10/2023.  3. Myofascial Pain: Continue current medication regimen. Continue to Monitor. 01/10/2023 4. Chronic Post Thoracotomy Pain: Continue current medication regimen. Continue to Monitor. 01/10/2023   F/U with Dr Dagoberto Ligas

## 2023-01-16 LAB — TOXASSURE SELECT,+ANTIDEPR,UR

## 2023-01-18 ENCOUNTER — Other Ambulatory Visit: Payer: Self-pay | Admitting: Physical Medicine and Rehabilitation

## 2023-01-18 ENCOUNTER — Other Ambulatory Visit: Payer: Self-pay | Admitting: Physical Medicine & Rehabilitation

## 2023-01-19 ENCOUNTER — Encounter: Payer: Self-pay | Admitting: Registered Nurse

## 2023-01-27 ENCOUNTER — Telehealth: Payer: Self-pay | Admitting: *Deleted

## 2023-01-27 NOTE — Telephone Encounter (Signed)
FYI: patient just wanted to let Dr.Lovorn know that his pharmacy was only able to fill half of Xtampza rx. They filled for #30 and have ordered medication.

## 2023-01-29 ENCOUNTER — Telehealth: Payer: Self-pay | Admitting: *Deleted

## 2023-01-29 NOTE — Telephone Encounter (Signed)
Urine drug screen for this encounter is consistent for prescribed medication 

## 2023-02-04 ENCOUNTER — Other Ambulatory Visit: Payer: Self-pay | Admitting: Pain Medicine

## 2023-02-04 ENCOUNTER — Other Ambulatory Visit: Payer: Self-pay | Admitting: Physical Medicine and Rehabilitation

## 2023-02-04 ENCOUNTER — Telehealth: Payer: Self-pay | Admitting: *Deleted

## 2023-02-04 DIAGNOSIS — M5416 Radiculopathy, lumbar region: Secondary | ICD-10-CM

## 2023-02-04 MED ORDER — XTAMPZA ER 36 MG PO C12A: 36.0000 mg | EXTENDED_RELEASE_CAPSULE | Freq: Two times a day (BID) | ORAL | 0 refills | Status: DC

## 2023-02-04 NOTE — Telephone Encounter (Signed)
Patient advised.

## 2023-02-04 NOTE — Telephone Encounter (Signed)
Patient called on 4/8 to report [facility] was only able to fill Xtampza for #30 out of #60 . Per the pharmacy they will need a new rx.  Patient is out of meds.  oxyCODONE ER (XTAMPZA ER) 36 MG C12A [[id number]]   Order Details Dose: 36 mg Route: Oral Frequency: 2 times daily  Dispense Quantity: 60 capsule Refills: 0   Note to Pharmacy: Do Not Fill Before 02/12/2023  Indications of Use: Chronic Pain       Sig: Take 1 capsule (36 mg total) by mouth 2 (two) times daily. Don't refill early       Start Date: 01/10/23 End Date: --  Written Date: 01/10/23 Expiration Date: 07/09/23  Earliest Fill Date: 01/10/23    Original Order: oxyCODONE ER (XTAMPZA ER) 36 MG C12A [[id number]]

## 2023-02-05 ENCOUNTER — Telehealth: Payer: Self-pay | Admitting: Physical Medicine and Rehabilitation

## 2023-02-05 ENCOUNTER — Other Ambulatory Visit: Payer: Self-pay | Admitting: *Deleted

## 2023-02-05 MED ORDER — XTAMPZA ER 36 MG PO C12A: 36.0000 mg | EXTENDED_RELEASE_CAPSULE | Freq: Two times a day (BID) | ORAL | 0 refills | Status: DC

## 2023-02-05 NOTE — Telephone Encounter (Signed)
[facility] telling [facility] to [facility] the Rx sent yesterday 02/04/23 for [place] [facility] ER 36 mg.

## 2023-02-05 NOTE — Telephone Encounter (Signed)
Patient states pharmacy notified him the are unable to fill prescription for oxycodone until the 24th based on our note attached to his prescription. Patient is requesting new prescription is sent for immediate refill he has been out of his medicine now for 2 days.

## 2023-02-05 NOTE — Telephone Encounter (Signed)
PMP was Reviewed.  Xtampza E-scribed.  This provider spoke with Pharmacist and [name] regarding the above, He verbalizes understanding.

## 2023-02-05 NOTE — Addendum Note (Signed)
Addended by: Jones Bales on: 02/05/2023 02:41 PM   Modules accepted: Orders

## 2023-02-19 ENCOUNTER — Other Ambulatory Visit: Payer: Medicaid Other

## 2023-02-22 ENCOUNTER — Ambulatory Visit
Admission: RE | Admit: 2023-02-22 | Discharge: 2023-02-22 | Disposition: A | Discharge status: Home / Self Care | Payer: Medicaid Other | Source: Ambulatory Visit | Attending: Pain Medicine | Admitting: Pain Medicine

## 2023-02-22 DIAGNOSIS — M5416 Radiculopathy, lumbar region: Secondary | ICD-10-CM

## 2023-03-06 ENCOUNTER — Other Ambulatory Visit: Payer: Self-pay | Admitting: Physical Medicine & Rehabilitation

## 2023-03-06 ENCOUNTER — Other Ambulatory Visit: Payer: Self-pay

## 2023-03-06 ENCOUNTER — Ambulatory Visit
Admission: RE | Admit: 2023-03-06 | Discharge: 2023-03-06 | Disposition: A | Discharge status: Home / Self Care | Payer: Medicaid Other | Source: Ambulatory Visit | Attending: Physical Medicine & Rehabilitation | Admitting: Physical Medicine & Rehabilitation

## 2023-03-06 DIAGNOSIS — M47817 Spondylosis without myelopathy or radiculopathy, lumbosacral region: Secondary | ICD-10-CM

## 2023-03-06 MED ORDER — XTAMPZA ER 36 MG PO C12A: 36.0000 mg | EXTENDED_RELEASE_CAPSULE | Freq: Two times a day (BID) | ORAL | 0 refills | Status: DC

## 2023-03-06 NOTE — Telephone Encounter (Signed)
[facility] [place]  Filled  Written  ID  Drug  QTY  Days  Prescriber  RX #  Dispenser  Refill  Daily Dose*  Pymt Type  PMP  02/13/2023 01/10/2023 1  Oxycodone Hcl (Ir) 5 Mg Tablet 90.00 30 Eu Tho [id number] Nor ([id number]) 0/0 22.50 MME Medicaid [place] 02/06/2023 02/06/2023 1  Phentermine 37.5 Mg Tablet 15.00 30 Ma Ach [id number] Nor ([id number]) 0/0  Private Pay [place] 02/05/2023 02/05/2023 1  Gabapentin 300 Mg Capsule 270.00 90 Me Lov [id number] Nor ([id number]) 0/5  Medicaid [place] 02/05/2023 02/05/2023 1  Xtampza Er 36 Mg Capsule 60.00 30 Eu Tho [id number] Nor ([id number]) 0/0 120.00 MME Medicaid [place]

## 2023-03-12 ENCOUNTER — Other Ambulatory Visit: Payer: Self-pay | Admitting: *Deleted

## 2023-03-13 MED ORDER — OXYCODONE HCL 5 MG PO TABS: 5.0000 mg | ORAL_TABLET | Freq: Three times a day (TID) | ORAL | 0 refills | Status: DC | PRN

## 2023-03-18 ENCOUNTER — Other Ambulatory Visit: Payer: Self-pay | Admitting: *Deleted

## 2023-03-18 NOTE — Telephone Encounter (Signed)
Patient calling for refill on Gabapentin. He is having trouble getting it filled by pharmacy. Pharmacy contacted and issue resolved.

## 2023-04-07 ENCOUNTER — Other Ambulatory Visit: Payer: Self-pay

## 2023-04-07 MED ORDER — XTAMPZA ER 36 MG PO C12A: 36.0000 mg | EXTENDED_RELEASE_CAPSULE | Freq: Two times a day (BID) | ORAL | 0 refills | Status: DC

## 2023-04-07 NOTE — Telephone Encounter (Signed)
Patient stated he was given Oxycodone 5 MG at an Urgent Care  for a Spider bite. (Patient stated he never filled the  5 MG TABS).He requesting a Xtampza Refill. Please send to [facility].    Filled  Written  ID  Drug  QTY  Days  Prescriber  RX #  Dispenser  Refill  Daily Dose*  Pymt Type  PMP  03/18/2023 02/05/2023 1  Gabapentin 300 Mg Capsule 270.00 30 Me Lov [id number] Nor ([id number]) 1/5  Medicaid [place] 03/13/2023 03/07/2023 1  Phentermine 37.5 Mg Tablet 15.00 30 Ma Ach [id number] Nor ([id number]) 0/0  Private Pay [place] 03/13/2023 03/13/2023 1  Oxycodone Hcl (Ir) 5 Mg Tablet 90.00 30 Me Lov [id number] Nor ([id number]) 0/0 22.50 MME Medicaid [place] 03/06/2023 03/06/2023 1  Xtampza Er 36 Mg Capsule 60.00 30 Me Lov [id number] Nor ([id number]) 0/0 120.00 MME Medicaid [place] 02/13/2023 01/10/2023 1  Oxycodone Hcl (Ir) 5 Mg Tablet 90.00 30 Eu Tho [id number] Nor ([id number]) 0/0 22.50 MME Medicaid [place] 02/06/2023 02/06/2023 1  Phentermine 37.5 Mg Tablet 15.00 30 Ma Ach [id number] Nor ([id number]) 0/0  Private Pay [place]

## 2023-04-08 ENCOUNTER — Emergency Department (HOSPITAL_COMMUNITY)
Admission: EM | Admit: 2023-04-08 | Discharge: 2023-04-09 | Disposition: A | Discharge status: Home / Self Care | Payer: Medicaid Other | Attending: Emergency Medicine | Admitting: Emergency Medicine

## 2023-04-08 ENCOUNTER — Telehealth: Payer: Self-pay

## 2023-04-08 ENCOUNTER — Emergency Department (HOSPITAL_COMMUNITY): Payer: Medicaid Other

## 2023-04-08 ENCOUNTER — Encounter (HOSPITAL_COMMUNITY): Payer: Self-pay

## 2023-04-08 ENCOUNTER — Other Ambulatory Visit: Payer: Self-pay

## 2023-04-08 DIAGNOSIS — R070 Pain in throat: Secondary | ICD-10-CM | POA: Diagnosis not present

## 2023-04-08 DIAGNOSIS — M791 Myalgia, unspecified site: Secondary | ICD-10-CM | POA: Diagnosis not present

## 2023-04-08 DIAGNOSIS — Z1152 Encounter for screening for COVID-19: Secondary | ICD-10-CM | POA: Insufficient documentation

## 2023-04-08 DIAGNOSIS — D72829 Elevated white blood cell count, unspecified: Secondary | ICD-10-CM | POA: Diagnosis not present

## 2023-04-08 DIAGNOSIS — R21 Rash and other nonspecific skin eruption: Secondary | ICD-10-CM | POA: Diagnosis present

## 2023-04-08 LAB — CBC
HCT: 46.1 % (ref 39.0–52.0)
Hemoglobin: 15.5 g/dL (ref 13.0–17.0)
MCH: 30.6 pg (ref 26.0–34.0)
MCHC: 33.6 g/dL (ref 30.0–36.0)
MCV: 90.9 fL (ref 80.0–100.0)
Platelets: 303 10*3/uL (ref 150–400)
RBC: 5.07 MIL/uL (ref 4.22–5.81)
RDW: 13.4 % (ref 11.5–15.5)
WBC: 15 10*3/uL — ABNORMAL HIGH (ref 4.0–10.5)
nRBC: 0 % (ref 0.0–0.2)

## 2023-04-08 LAB — RESP PANEL BY RT-PCR (RSV, FLU A&B, COVID)  RVPGX2
Influenza A by PCR: NEGATIVE
Influenza B by PCR: NEGATIVE
Resp Syncytial Virus by PCR: NEGATIVE
SARS Coronavirus 2 by RT PCR: NEGATIVE

## 2023-04-08 LAB — BASIC METABOLIC PANEL
Anion gap: 15 (ref 5–15)
BUN: 24 mg/dL — ABNORMAL HIGH (ref 6–20)
CO2: 23 mmol/L (ref 22–32)
Calcium: 9.7 mg/dL (ref 8.9–10.3)
Chloride: 100 mmol/L (ref 98–111)
Creatinine, Ser: 1.03 mg/dL (ref 0.61–1.24)
GFR, Estimated: >60 mL/min (ref >60)
Glucose, Bld: 168 mg/dL — ABNORMAL HIGH (ref 70–99)
Potassium: 4.5 mmol/L (ref 3.5–5.1)
Sodium: 138 mmol/L (ref 135–145)

## 2023-04-08 LAB — TROPONIN I (HIGH SENSITIVITY): Troponin I (High Sensitivity): 11 ng/L (ref <18)

## 2023-04-08 LAB — GROUP A STREP BY PCR: Group A Strep by PCR: NOT DETECTED

## 2023-04-08 MED ORDER — KETOROLAC TROMETHAMINE 30 MG/ML IJ SOLN
30.0000 mg | Freq: Once | INTRAMUSCULAR | Status: AC
  Administered 2023-04-08: 30 mg via INTRAVENOUS
  Filled 2023-04-08: qty 1

## 2023-04-08 MED ORDER — HYDROMORPHONE HCL 1 MG/ML IJ SOLN
1.0000 mg | Freq: Once | INTRAMUSCULAR | Status: AC
  Administered 2023-04-08: 1 mg via INTRAVENOUS
  Filled 2023-04-08: qty 1

## 2023-04-08 NOTE — ED Provider Notes (Signed)
[facility]EMER[facility]ENC[facility] DEPT [facility] Emergency Department Provider Note MRN:  [id number]  Arrival date & time: 04/09/23     Chief Complaint   Shortness of Breath and Rash   History of Present Illness   [name] is a 47 y.o. year-old male presents to the ED with chief complaint of rash.  States that onset of symptoms was on Monday after he was out "bushwhacking" in his tractor.  He was seen by his doctor and thought he might have had a tick bite.  He is taking a medrol dose pack and doxycycline, but still reports painful rash that is moving across his body and states that he has a sore throat and irritated/painful esophagus.  He reports generalized body aches due and states that he has not had his daily chronic pain medication.  History provided by patient.   Review of Systems  Pertinent positive and negative review of systems noted in HPI.    Physical Exam   Vitals:   04/08/23 2300 04/09/23 0300  BP: 123/84 131/77  Pulse: 84 86  Resp: 18 17  Temp:  (!) 97.3 F (36.3 C)  SpO2: 98% 97%    CONSTITUTIONAL:  non toxic-appearing, NAD NEURO:  Alert and oriented x 3, CN 3-12 grossly intact EYES:  eyes equal and reactive ENT/NECK:  Supple, no stridor, no notable oropharyngeal edema or erythema, tolerating secretions, normal phonations CARDIO:  normal rate on my exam, regular rhythm, appears well-perfused  PULM:  No respiratory distress, lungs are clear GI/GU:  non-distended,  MSK/SPINE:  No gross deformities, no edema, moves all extremities  SKIN:  rash to right shoulder    *Additional and/or pertinent findings included in MDM below  Diagnostic and Interventional Summary    EKG Interpretation  Date/Time:  Tuesday April 08 2023 19:32:02 EDT Ventricular Rate:  116 PR Interval:  152 QRS Duration: 92 QT Interval:  324 QTC Calculation: 450 R Axis:   93 Text Interpretation: Sinus tachycardia Rightward axis Low voltage QRS Cannot rule out Anterior infarct , age  undetermined Abnormal ECG When compared with ECG of 28-Jul-2020 14:07, PREVIOUS ECG IS PRESENT Confirmed by Marily Memos [phone]) on 04/08/2023 10:57:59 PM       Labs Reviewed  BASIC METABOLIC PANEL - Abnormal; Notable for the following components:      Result Value   Glucose, Bld 168 (*)    BUN 24 (*)    All other components within normal limits  CBC - Abnormal; Notable for the following components:   WBC 15.0 (*)    All other components within normal limits  GROUP A STREP BY PCR  RESP PANEL BY RT-PCR (RSV, FLU A&B, COVID)  RVPGX2  CK  LYME DISEASE SEROLOGY W/REFLEX  TROPONIN I (HIGH SENSITIVITY)  TROPONIN I (HIGH SENSITIVITY)    CT Maxillofacial W Contrast  Final Result    CT Soft Tissue Neck W Contrast  Final Result    DG Chest 2 View  Final Result      Medications  gabapentin (NEURONTIN) capsule 900 mg (has no administration in time range)  ketorolac (TORADOL) 30 MG/ML injection 30 mg (30 mg Intravenous Given 04/08/23 2351)  HYDROmorphone (DILAUDID) injection 1 mg (1 mg Intravenous Given 04/08/23 2351)  sodium chloride 0.9 % bolus 1,000 mL (1,000 mLs Intravenous New Bag/Given 04/09/23 0226)  iohexol (OMNIPAQUE) 350 MG/ML injection 75 mL (75 mLs Intravenous Contrast Given 04/09/23 0245)     Procedures  /  Critical Care Procedures  ED Course and Medical Decision Making  I have reviewed the triage vital signs, the nursing notes, and pertinent available records from the EMR.  Social Determinants Affecting Complexity of Care: Patient has no clinically significant social determinants affecting this chief complaint..   ED Course:    Medical Decision Making Patient here with rash, throat pain, upper neck pain, and forehead pain.  Taking Doxy and medrol dose pak.  Uncertain cause of patient symptoms.  No respiratory distress.  Complains of significant pain.  Seen by and discussed with Dr. Clayborne Dana, who recommends imaging of the face and neck.  Imaging is reassuring.  CK  normal.  Labs notable for leukocytosis, but thought 2/2 steroid use.  No sign of cellulitis or infection.  Amount and/or Complexity of Data Reviewed Labs: ordered. Radiology: ordered.  Risk Prescription drug management.     Consultants: No consultations were needed in caring for this patient.   Treatment and Plan: I considered admission due to patient's initial presentation, but after considering the examination and diagnostic results, patient will not require admission and can be discharged with outpatient follow-up.  Patient seen by and discussed with attending physician, Dr. Clayborne Dana, who has recommend additional workup and imaging.  We reviewed the images and additional labs.  Patient felt stable for discharge from ED.  Will likely need further outpatient workup with derm or allergy.  Final Clinical Impressions(s) / ED Diagnoses     ICD-10-CM   1. Rash  R21     2. Throat pain  R07.0     3. Myalgia  M79.10       ED Discharge Orders          Ordered    EPINEPHrine 0.3 mg/0.3 mL IJ SOAJ injection  As needed        04/09/23 0324              Discharge Instructions Discussed with and Provided to Patient:     Discharge Instructions      It's unclear what the cause of your symptoms are.  I recommend that you follow-up with an ALLERGIST and DERMATOLOGIST.  I've listed 2 numbers you can call for a follow-up appointment.    Continue taking your steroid and antibiotic.    Use the epipen if you can't breathe.  If you use the epipen, call 911.         Roxy Horseman, PA-C 04/09/23 4098    Marily Memos, MD 04/09/23 [phone]

## 2023-04-08 NOTE — ED Provider Triage Note (Signed)
Emergency Medicine Provider Triage Evaluation Note  [name] , a 47 y.o. male  was evaluated in triage.  Pt complains of worsening headache today.  Patient reports has been seen in urgent care in [facility] and was given prednisone shot and was sent home on antibiotic and steroids for a rash she sustained after bush hogging as few days prior.  He reports he was discharged home on steroids and Pepcid.  He does not think that his rash is improved and is now feel like he is having a sore throat and headache.  He feels that he is short of breath as well.  He has been on doxycycline.  Review of Systems  Positive:  Negative:   Physical Exam  BP (!) 136/99 (BP Location: Left Arm)   Pulse (!) 115   Temp 98.5 F (36.9 C)   Resp (!) 22   SpO2 99%  Gen:   Awake, appears extremely anxious Resp:  Normal effort, lungs are clear to auscultation bilaterally.  He speaking in full sentence with ease.  I do not appreciate any stridor as well. MSK:   Moves extremities without difficulty  Other:  Papular rash seen to the right upper extremity. No blistering. No skin sloughing. Denies pain with palpation.  Airway patent.  Minimal erythema throughout patient also had a red candy before hand.  Uvula midline.  Medical Decision Making  Medically screening exam initiated at 7:40 PM.  Appropriate orders placed.  Ark [place] was informed that the remainder of the evaluation will be completed by another provider, this initial triage assessment does not replace that evaluation, and the importance of remaining in the ED until their evaluation is complete.  Chest pain labs ordered.  He denies any worsening of his rash.  I do not appreciate any stridor.  His lung sounds are completely clear.  He has mild tachycardic although appears very anxious.  Will order chest pain workup.  He also reports having headache but does not have any focal neurodeficit.  He also has not been taking his pain medication that he has  been prescribed.   Achille Rich, [place] 04/08/23 1944

## 2023-04-08 NOTE — Telephone Encounter (Signed)
PA for Marlowe Kays has been submitted

## 2023-04-08 NOTE — ED Triage Notes (Signed)
Pt reports rash on his right shoulder and arm since Monday with associated sob, neck pain and swelling, and difficulty swallowing. Pt also reports generalized body aching as well.

## 2023-04-09 ENCOUNTER — Emergency Department (HOSPITAL_COMMUNITY): Payer: Medicaid Other

## 2023-04-09 LAB — CK: Total CK: 72 U/L (ref 49–397)

## 2023-04-09 LAB — TROPONIN I (HIGH SENSITIVITY): Troponin I (High Sensitivity): 5 ng/L (ref <18)

## 2023-04-09 MED ORDER — EPINEPHRINE 0.3 MG/0.3ML IJ SOAJ: 0.3000 mg | INTRAMUSCULAR | 0 refills | Status: DC | PRN

## 2023-04-09 MED ORDER — SODIUM CHLORIDE 0.9 % IV BOLUS
1000.0000 mL | Freq: Once | INTRAVENOUS | Status: AC
  Administered 2023-04-09: 1000 mL via INTRAVENOUS

## 2023-04-09 MED ORDER — GABAPENTIN 300 MG PO CAPS
900.0000 mg | ORAL_CAPSULE | Freq: Once | ORAL | Status: AC
  Administered 2023-04-09: 900 mg via ORAL
  Filled 2023-04-09: qty 3

## 2023-04-09 MED ORDER — IOHEXOL 350 MG/ML SOLN
75.0000 mL | Freq: Once | INTRAVENOUS | Status: AC | PRN
  Administered 2023-04-09: 75 mL via INTRAVENOUS

## 2023-04-09 NOTE — ED Notes (Signed)
Pt provided with AVS.  Education complete; all questions answered.  Pt leaving ED in stable condition at this time, ambulatory with all belongings. 

## 2023-04-09 NOTE — Discharge Instructions (Signed)
It's unclear what the cause of your symptoms are.  I recommend that you follow-up with an ALLERGIST and DERMATOLOGIST.  I've listed 2 numbers you can call for a follow-up appointment.    Continue taking your steroid and antibiotic.    Use the epipen if you can't breathe.  If you use the epipen, call 911.

## 2023-04-09 NOTE — Telephone Encounter (Signed)
Xtampza approved.

## 2023-04-09 NOTE — ED Provider Notes (Signed)
I provided a substantive portion of the care of this patient.  I personally made/approved the management plan for this patient and take responsibility for the patient management.   Acute onset of right shoulder/arm rash last week. Started doxy/prednisone. A few days later started hurting, seen again. Pain has since spread to upper chest and neck with feeling of swelling in his neck. Pain with swallowing. Hoarse voice. Compliant with medications. Feels like it is getting worse and now having headache as well. Never had anything like this previously.  Labs with elevated BUN, will add on CK. Will ct neck/face to ensure no edema or other obvious causes for hoarse voice and other symptoms requiring emergent treatment/evaluation otherwise follow up with derm.   EKG Interpretation  Date/Time:  Tuesday April 08 2023 19:32:02 EDT Ventricular Rate:  116 PR Interval:  152 QRS Duration: 92 QT Interval:  324 QTC Calculation: 450 R Axis:   93 Text Interpretation: Sinus tachycardia Rightward axis Low voltage QRS Cannot rule out Anterior infarct , age undetermined Abnormal ECG When compared with ECG of 28-Jul-2020 14:07, PREVIOUS ECG IS PRESENT Confirmed by Marily Memos [phone]) on 04/08/2023 10:57:59 PM    Victorian Gunn, Barbara Cower, MD 04/09/23 [phone]

## 2023-04-10 ENCOUNTER — Encounter: Payer: Self-pay | Admitting: Allergy and Immunology

## 2023-04-10 ENCOUNTER — Ambulatory Visit (INDEPENDENT_AMBULATORY_CARE_PROVIDER_SITE_OTHER): Payer: No Typology Code available for payment source | Admitting: Allergy and Immunology

## 2023-04-10 ENCOUNTER — Other Ambulatory Visit (HOSPITAL_COMMUNITY): Payer: Self-pay

## 2023-04-10 VITALS — BP 152/102 | HR 84 | Resp 14 | Ht 74.0 in | Wt 328.8 lb

## 2023-04-10 DIAGNOSIS — R232 Flushing: Secondary | ICD-10-CM

## 2023-04-10 DIAGNOSIS — R490 Dysphonia: Secondary | ICD-10-CM

## 2023-04-10 DIAGNOSIS — T7840XD Allergy, unspecified, subsequent encounter: Secondary | ICD-10-CM | POA: Diagnosis not present

## 2023-04-10 DIAGNOSIS — L989 Disorder of the skin and subcutaneous tissue, unspecified: Secondary | ICD-10-CM

## 2023-04-10 DIAGNOSIS — I10 Essential (primary) hypertension: Secondary | ICD-10-CM

## 2023-04-10 LAB — LYME DISEASE SEROLOGY W/REFLEX: Lyme Total Antibody EIA: NEGATIVE

## 2023-04-10 MED ORDER — VALACYCLOVIR HCL 1 G PO TABS: 1000.0000 mg | ORAL_TABLET | Freq: Three times a day (TID) | ORAL | 0 refills | Status: AC

## 2023-04-10 MED ORDER — ALBUTEROL SULFATE HFA 108 (90 BASE) MCG/ACT IN AERS: 1.0000 | INHALATION_SPRAY | Freq: Four times a day (QID) | RESPIRATORY_TRACT | 1 refills | Status: DC | PRN

## 2023-04-10 MED ORDER — EPINEPHRINE 0.3 MG/0.3ML IJ SOAJ: 0.3000 mg | INTRAMUSCULAR | 1 refills | Status: DC | PRN

## 2023-04-10 MED ORDER — FLUCONAZOLE 150 MG PO TABS: 150.0000 mg | ORAL_TABLET | Freq: Once | ORAL | 0 refills | Status: AC

## 2023-04-10 MED ORDER — FAMOTIDINE 40 MG PO TABS: 40.0000 mg | ORAL_TABLET | Freq: Two times a day (BID) | ORAL | 5 refills | Status: DC

## 2023-04-10 MED ORDER — CETIRIZINE HCL 10 MG PO TABS: ORAL_TABLET | ORAL | 5 refills | Status: DC

## 2023-04-10 NOTE — Progress Notes (Signed)
[place] - [place] - [place] - [place] - [place]   NEW PATIENT NOTE  Referring Provider: Anselmo Leon, * Primary Provider: Anselmo Pickler, Leon Date of office visit: 04/10/2023    Subjective:   Chief Complaint:  [name] (DOB: 1976/02/06) is a 47 y.o. male who presents to the clinic on 04/10/2023 with a chief complaint of Allergic Reaction, Facial Pain, and Rash .     HPI: [name] presents to this clinic in evaluation of immune activation.  His history dates back about 10 days or so at which time he was bush whacking his property and had extensive exposure to this material on his body and within 48 hours started to develop a very intense itchy stinging dermatitis affecting his right arm and shoulder.  He went to see his urgent care center and was treated with a steroid shot and doxycycline and a topical agent.  The next day he had significant problems with his throat which may have actually developed a little bit earlier and he felt as though he was hard to talk and he went to the emergency room and he was treated with prednisone and cephalexin.  Currently, he has a very itchy dermatitis affecting his right arm and chest, the sensation that his face is swelling and his throat is swelling, raspy voice, headache, flushing.  In addition, he feels as though he cannot sleep and is very anxious and overall just feels really bad.  It should be noted that he has been on at least 2 courses of oral steroids and at least 2 injections of systemic steroids to address this issue.  He has not started any new medications other than that prescribed for his reaction.  He has not started any new over-the-counter supplements or health foods or energy boosters or herbs.  His environmental exposures have not really changed very much while at work and at home although as noted above he did go bush whacking within 48 hours prior to the onset of this whole issue.  Past Medical History:   Diagnosis Date   Anxiety and depression 08/01/2013   Cancer (HCC)    Cellulitis    left leg and stomach   Chicken pox    Chronic pain    Diarrhea 08/01/2013   History of kidney cancer    Hx of vasectomy    Hypertension    Renal cell carcinoma (HCC) 06/01/2013    Past Surgical History:  Procedure Laterality Date   COLONOSCOPY WITH PROPOFOL N/A 08/19/2013   Procedure: COLONOSCOPY WITH PROPOFOL;  Surgeon: [name];  Location: [facility];  Service: Endoscopy;  Laterality: N/A;   ESOPHAGOGASTRODUODENOSCOPY (EGD) WITH PROPOFOL N/A 08/19/2013   Procedure: ESOPHAGOGASTRODUODENOSCOPY (EGD) WITH PROPOFOL;  Surgeon: [name];  Location: [facility];  Service: Endoscopy;  Laterality: N/A;   IR RADIOLOGIST EVAL & MGMT  05/20/2019   KNEE ARTHROSCOPY Left 07/13/2020   Procedure: left knee arthroscopy, debridement, cyst decompression;  Surgeon: Todd Copa, Leon;  Location: [facility];  Service: Orthopedics;  Laterality: Left;   ORIF MANDIBULAR FRACTURE N/A 02/16/2016   Procedure: OPEN REDUCTION INTERNAL FIXATION (ORIF) MANDIBULAR FRACTURE;  Surgeon: Todd Obey, Leon;  Location: [facility] OR;  Service: ENT;  Laterality: N/A;   RIB PLATING Left 02/20/2016   Procedure: LEFT RIB PLATING;  Surgeon: Todd Perna, Leon;  Location: [facility] OR;  Service: Thoracic;  Laterality: Left;   ROBOTIC ASSITED PARTIAL NEPHRECTOMY Left 06/17/2013   Procedure: ROBOTIC ASSITED PARTIAL  NEPHRECTOMY;  Surgeon: [name], Leon;  Location: [facility];  Service: Urology;  Laterality: Left;   TRACHEOSTOMY TUBE PLACEMENT N/A 02/16/2016   Procedure: TRACHEOSTOMY;  Surgeon: Todd Obey, Leon;  Location: [facility] OR;  Service: ENT;  Laterality: N/A;   VASECTOMY  2012   WISDOM TOOTH EXTRACTION  middle school   WRIST SURGERY Left middle school   "arteries and nerves tangled up"    Allergies as of 04/10/2023       Reactions   Bee Venom Shortness Of Breath, Swelling   Arm swells   Hornet Venom Shortness Of Breath,  Swelling, Anaphylaxis   Arm swells Arm swells Arm swells Arm swells   Other Anaphylaxis        Medication List    acetaminophen 500 MG tablet Commonly known as: TYLENOL Take 1,000 mg by mouth every 6 (six) hours as needed for moderate pain.   albuterol 108 (90 Base) MCG/ACT inhaler Commonly known as: VENTOLIN HFA Inhale 1-2 puffs into the lungs every 6 (six) hours as needed for wheezing or shortness of breath.   ALPRAZolam 0.5 MG tablet Commonly known as: XANAX Take 0.25 mg by mouth every morning.   ascorbic acid 1000 MG tablet Commonly known as: VITAMIN C Take by mouth.   Blood Pressure Monitor Automat Devi Check blood pressure during pain level.   cephALEXin 500 MG capsule Commonly known as: KEFLEX Take 500 mg by mouth every 8 (eight) hours.   cetirizine 10 MG tablet Commonly known as: ZYRTEC 2 tablets 2 times per day   doxycycline 100 MG capsule Commonly known as: VIBRAMYCIN Take 100 mg by mouth 2 (two) times daily.   EC-Naproxen 500 MG EC tablet Generic drug: naproxen TAKE 1 TABLET BY MOUTH 2 TIMES DAILY WITH A MEAL.   EPINEPHrine 0.3 mg/0.3 mL Soaj injection Commonly known as: EPI-PEN Inject 0.3 mg into the muscle as needed for anaphylaxis. As needed for life-threatening allergic reactions   famotidine 40 MG tablet Commonly known as: PEPCID Take 1 tablet (40 mg total) by mouth 2 (two) times daily.   fluconazole 150 MG tablet Commonly known as: Diflucan Take 1 tablet (150 mg total) by mouth once for 1 dose. Started by: Todd Priest, Leon   fluticasone 50 MCG/ACT nasal spray Commonly known as: FLONASE Place 1 spray into both nostrils at bedtime.   gabapentin 300 MG capsule Commonly known as: NEURONTIN TAKE 3 CAPSULES IN THE MORNING, 3 CAPSULES AT LUNCH, AND 3 CAPSULES ARE BEDTIME   guaiFENesin-codeine 100-10 MG/5ML syrup Take 5 mLs by mouth 2 (two) times daily.   hydrOXYzine 50 MG tablet Commonly known as: ATARAX Take 50 mg by mouth 3  (three) times daily.   lidocaine 5 % Commonly known as: LIDODERM PLACE 3 PATCHES ONTO THE SKIN DAILY. REMOVE & DISCARD PATCH WITHIN 12 HOURS OR AS DIRECTED BY Leon   metaxalone 800 MG tablet Commonly known as: SKELAXIN TAKE 1 TABLET (800 MG TOTAL) BY MOUTH 3 (THREE) TIMES DAILY. AS NEEDED   metoprolol succinate 100 MG 24 hr tablet Commonly known as: TOPROL-XL Take 100 mg by mouth daily.   olmesartan 40 MG tablet Commonly known as: BENICAR Take 40 mg by mouth daily.   omeprazole 20 MG capsule Commonly known as: PRILOSEC TAKE 1 CAPSULE BY MOUTH EVERY DAY   oxyCODONE 5 MG immediate release tablet Commonly known as: Roxicodone Take 1 tablet (5 mg total) by mouth 3 (three) times daily as needed for breakthrough pain.   phentermine 37.5 MG tablet  Commonly known as: ADIPEX-P Take 18.75 mg by mouth every morning.   predniSONE 10 MG (48) Tbpk tablet Commonly known as: STERAPRED UNI-PAK 48 TAB Take by mouth as directed.   promethazine 25 MG tablet Commonly known as: PHENERGAN Take 25 mg by mouth every 6 (six) hours as needed for nausea or vomiting.   QUEtiapine 50 MG Tb24 24 hr tablet Commonly known as: SEROQUEL XR Take 50 mg by mouth.   sertraline 100 MG tablet Commonly known as: ZOLOFT Take 100 mg by mouth daily.   tadalafil 5 MG tablet Commonly known as: CIALIS Take 5 mg by mouth daily as needed for erectile dysfunction.   triamcinolone cream 0.1 % Commonly known as: KENALOG Apply 1 Application topically 2 (two) times daily.   valACYclovir 1000 MG tablet Commonly known as: VALTREX Take 1 tablet (1,000 mg total) by mouth 3 (three) times daily for 7 days. Started by: Todd Priest, Leon   Xtampza ER 36 MG C12a Generic drug: oxyCODONE ER Take 1 capsule (36 mg total) by mouth 2 (two) times daily. For chronic pain     Review of systems negative except as noted in HPI / PMHx or noted below:  Review of Systems  Constitutional: Negative.   HENT: Negative.    Eyes:  Negative.   Respiratory: Negative.    Cardiovascular: Negative.   Gastrointestinal: Negative.   Genitourinary: Negative.   Musculoskeletal: Negative.   Skin: Negative.   Neurological: Negative.   Endo/Heme/Allergies: Negative.   Psychiatric/Behavioral: Negative.      Family History  Problem Relation Age of Onset   Alcohol abuse Mother    Cirrhosis Father    Colitis Father    Heart disease Father    Asthma Father    Other Father        Chemical Imbalance   Colon polyps Sister        intestinal problems   Other Brother        Intestinal Fissure   Other Brother        Chemical Imbalance   Diabetes Maternal Grandfather    Prostate cancer Paternal Grandfather    Asthma Son    Heart attack Other        Paternal Grandparents   Stroke Other        Paternal Grandparents    Social History   Socioeconomic History   Marital status: Divorced    Spouse name: Not on file   Number of children: 3   Years of education: Not on file   Highest education level: Not on file  Occupational History   Not on file  Tobacco Use   Smoking status: Every Day    Packs/day: 1.00    Years: 24.00    Additional pack years: 0.00    Total pack years: 24.00    Types: Cigarettes   Smokeless tobacco: Never  Vaping Use   Vaping Use: Never used  Substance and Sexual Activity   Alcohol use: Not Currently    Alcohol/week: 0.0 standard drinks of alcohol   Drug use: No   Sexual activity: Not on file  Other Topics Concern   Not on file  Social History Narrative   ** Merged History Encounter **          Right handed    Lives on 2nd floor   Environmental and Social history  Lives in a house with a dry environment, dogs located inside the household, carpet in the bedroom, no plastic on the bed,  no plastic on the pillow, actively smoking tobacco products, and employment as a maintenance tech for a housing facility.  Objective:   Vitals:   04/10/23 0832 04/10/23 0942  BP: (!) 156/98 (!)  152/102  Pulse: 84   Resp: 14   SpO2: 97%    Height: 6\' 2"  (188 cm) Weight: (!) 328 lb 12.8 oz (149.1 kg)  Physical Exam Constitutional:      Appearance: He is not diaphoretic.  HENT:     Head: Normocephalic.     Right Ear: Tympanic membrane, ear canal and external ear normal.     Left Ear: Tympanic membrane, ear canal and external ear normal.     Nose: Nose normal. No mucosal edema or rhinorrhea.     Mouth/Throat:     Pharynx: Uvula midline. No oropharyngeal exudate.  Eyes:     Conjunctiva/sclera: Conjunctivae normal.  Neck:     Thyroid: No thyromegaly.     Trachea: Trachea normal. No tracheal tenderness or tracheal deviation.  Cardiovascular:     Rate and Rhythm: Normal rate and regular rhythm.     Heart sounds: Normal heart sounds, S1 normal and S2 normal. No murmur heard. Pulmonary:     Effort: No respiratory distress.     Breath sounds: Normal breath sounds. No stridor. No wheezing or rales.  Lymphadenopathy:     Head:     Right side of head: No tonsillar adenopathy.     Left side of head: No tonsillar adenopathy.     Cervical: No cervical adenopathy.  Skin:    Findings: Rash (Patchy erythema affecting face, neck, upper torso.  Very papular erythematous dermatitis affecting right shoulder and upper arm) present. No erythema.     Nails: There is no clubbing.  Neurological:     Mental Status: He is alert.     Diagnostics: Allergy skin tests were not performed.   Results of a sinus/neck CT scan obtained 09 April 2023 identified the following:  PHARYNX AND LARYNX: The nasopharynx, oropharynx and larynx are normal. Visible portions of the oral cavity, tongue base and floor of mouth are normal. Normal epiglottis, vallecula and pyriform sinuses. The larynx is normal. No retropharyngeal abscess, effusion or lymphadenopathy.   SALIVARY GLANDS: Normal parotid, submandibular and sublingual glands.  THYROID: Normal.  LYMPH NODES: No enlarged or abnormal density lymph  nodes.  VASCULAR: Major cervical vessels are patent.  LIMITED INTRACRANIAL: Normal.  ORBITS: Normal.  MASTOIDS AND PARANASAL SINUSES: No fluid levels or advanced mucosal thickening. No mastoid effusion.  SKELETON: No bony spinal canal stenosis. No lytic or blastic lesions.  UPPER CHEST: Clear.    Assessment and Plan:    1. Allergic reaction, subsequent encounter   2. Inflammatory dermatosis   3. Flushing   4. Hoarseness   5. Primary hypertension    1. Discontinue doxycycline and cephalexin  2. Every day:   A. Cetirizine 10 mg - 2 tablets 2 times per day  B. Famotidine 40 mg - 1 tablet 2 times per day  3. Prednisone 10 mg - 1 tablet 2 times per day until return to clinic  4. Diflucan 150 - 1 tablet today  5. Valtrex 1000 mg - 3 times a day for 7 days  6. Minimize sun exposure as much as possible - hat   7. Blood - CBC w/diff  8. If needed: Epi-Pen, benadryl, Leon/ER evaluation  9. Return in 1 week  [name] has very significant immune activation and the etiologic factors responsible for this  issue is not entirely clear.  He very easily could have had some form of plant induced contact dermatitis followed by adverse effect to one of the antibiotics administered for this dermatitis and also could have some fungal overgrowth in his airway especially given the fact that he has been given 2 antibiotics and a systemic steroid and he very well could have had an outbreak of varicella affecting his right shoulder.  We are going to cover him for many different etiologic factors and we are going to have him consistently use an H1 and H2 receptor blocker and we will keep him on systemic steroids aiming for 20 mg/day until he returns to this clinic next week.  I have asked him to not go to work today and not have sun exposure as I suspect that he is having some form of photosensitivity in addition to his primary reaction.  And he has systemic arterial hypertension that he obviously needs to get  under control and we had a discussion about this issue today.  Apparently he did not take his antihypertensive medicines this morning.  Todd Priest, Leon Allergy / Immunology [facility]

## 2023-04-10 NOTE — Patient Instructions (Addendum)
  1. Discontinue doxycycline and cephalexin  2. Every day:   A. Cetirizine 10 mg - 2 tablets 2 times per day  B. Famotidine 40 mg - 1 tablet 2 times per day  3. Prednisone 10 mg - 1 tablet 2 times per day until return to clinic  4. Diflucan 150 - 1 tablet today  5. Valtrex 1000 mg - 3 times a day for 7 days  6. Minimize sun exposure as much as possible - hat   7. Blood - CBC w/diff  8. If needed: Epi-Pen, benadryl, MD/ER evaluation  9. Return in 1 week

## 2023-04-11 LAB — CBC WITH DIFFERENTIAL
Basophils Absolute: 0.1 10*3/uL (ref 0.0–0.2)
Basos: 0 %
EOS (ABSOLUTE): 1.3 10*3/uL — ABNORMAL HIGH (ref 0.0–0.4)
Eos: 10 %
Hematocrit: 43.8 % (ref 37.5–51.0)
Hemoglobin: 15.2 g/dL (ref 13.0–17.7)
Immature Grans (Abs): 0.2 10*3/uL — ABNORMAL HIGH (ref 0.0–0.1)
Immature Granulocytes: 1 %
Lymphocytes Absolute: 3.2 10*3/uL — ABNORMAL HIGH (ref 0.7–3.1)
Lymphs: 24 %
MCH: 30.6 pg (ref 26.6–33.0)
MCHC: 34.7 g/dL (ref 31.5–35.7)
MCV: 88 fL (ref 79–97)
Monocytes Absolute: 1 10*3/uL — ABNORMAL HIGH (ref 0.1–0.9)
Monocytes: 7 %
Neutrophils Absolute: 7.7 10*3/uL — ABNORMAL HIGH (ref 1.4–7.0)
Neutrophils: 58 %
RBC: 4.96 x10E6/uL (ref 4.14–5.80)
RDW: 13.3 % (ref 11.6–15.4)
WBC: 13.4 10*3/uL — ABNORMAL HIGH (ref 3.4–10.8)

## 2023-04-14 ENCOUNTER — Encounter: Payer: Self-pay | Admitting: Allergy and Immunology

## 2023-04-14 ENCOUNTER — Encounter: Payer: Medicaid Other | Admitting: Physical Medicine and Rehabilitation

## 2023-04-14 ENCOUNTER — Ambulatory Visit: Payer: No Typology Code available for payment source | Admitting: Allergy and Immunology

## 2023-04-14 ENCOUNTER — Telehealth: Payer: Self-pay | Admitting: Physical Medicine and Rehabilitation

## 2023-04-14 VITALS — BP 136/82 | HR 96 | Resp 16

## 2023-04-14 DIAGNOSIS — R490 Dysphonia: Secondary | ICD-10-CM

## 2023-04-14 DIAGNOSIS — T7840XD Allergy, unspecified, subsequent encounter: Secondary | ICD-10-CM

## 2023-04-14 DIAGNOSIS — D7219 Other eosinophilia: Secondary | ICD-10-CM

## 2023-04-14 DIAGNOSIS — L989 Disorder of the skin and subcutaneous tissue, unspecified: Secondary | ICD-10-CM

## 2023-04-14 DIAGNOSIS — I10 Essential (primary) hypertension: Secondary | ICD-10-CM

## 2023-04-14 DIAGNOSIS — R232 Flushing: Secondary | ICD-10-CM

## 2023-04-14 NOTE — Patient Instructions (Signed)
  1. Visit with Dermatology about using topicals for face today. Maybe biopsy of skin for urticaria pigmentosa???  2. Every day:   A. Cetirizine 10 mg - 2 tablets 2 times per day  B. Famotidine 40 mg - 1 tablet 2 times per day  3. Begin taper of Prednisone 10 mg:   A. 1 tablet in AM + 1/2 tablet in PM x 5 days, then  B. 1 tablet in AM x 10 days, then   C. 1/2 tablet in AM x 10 days, then discontinue  4. Finish Valtrex 1000 mg - 3 times a day for 7 days  5. If needed: Epi-Pen, benadryl, MD/ER evaluation  6. Minimize sun exposure as much as possible - hat   7. Return in 3 weeks or earlier if problem  8. Will need to repeat CBC w/d in one month.

## 2023-04-14 NOTE — Telephone Encounter (Signed)
Patient came in and states he will need medication refill on oxycodone 5mg  , and same pharmacy on file

## 2023-04-14 NOTE — Progress Notes (Unsigned)
[place] - [place] - [place] - [place] - [place]   Follow-up Note  Referring Provider: Anselmo Pickler, * Primary Provider: Anselmo Pickler, MD Date of Office Visit: 04/14/2023  Subjective:   [name] (DOB: May 10, 1976) is a 47 y.o. male who [facility] in re-evaluation of the following:  HPI: Ronne returns to this clinic in evaluation of immune activation.  I last saw him in this clinic during his initial evaluation of 10 April 2023.  He is a lot better.  The rash on his right arm is resolving.  The swelling of his face is better although he still feels puffy in his face.  He has some left ear discomfort still.  His hoarseness has improved.  He still feels kind of worn out and he still has a lot of fatigue and been having some night sweats.  But overall he is improving.  He has an appointment to see dermatology today.  Allergies as of 04/14/2023       Reactions   Bee Venom Shortness Of Breath, Swelling   Arm swells   Hornet Venom Shortness Of Breath, Swelling, Anaphylaxis   Arm swells Arm swells Arm swells Arm swells   Other Anaphylaxis        Medication List    acetaminophen 500 MG tablet Commonly known as: TYLENOL Take 1,000 mg by mouth every 6 (six) hours as needed for moderate pain.   albuterol 108 (90 Base) MCG/ACT inhaler Commonly known as: VENTOLIN HFA Inhale 1-2 puffs into the lungs every 6 (six) hours as needed for wheezing or shortness of breath.   ALPRAZolam 0.5 MG tablet Commonly known as: XANAX Take 0.25 mg by mouth every morning.   ascorbic acid 1000 MG tablet Commonly known as: VITAMIN C Take by mouth.   Blood Pressure Monitor Automat Devi Check blood pressure during pain level.   cephALEXin 500 MG capsule Commonly known as: KEFLEX Take 500 mg by mouth every 8 (eight) hours.   cetirizine 10 MG tablet Commonly known as: ZYRTEC 2 tablets 2 times per day   doxycycline 100  MG capsule Commonly known as: VIBRAMYCIN Take 100 mg by mouth 2 (two) times daily.   EC-Naproxen 500 MG EC tablet Generic drug: naproxen TAKE 1 TABLET BY MOUTH 2 TIMES DAILY WITH A MEAL.   EPINEPHrine 0.3 mg/0.3 mL Soaj injection Commonly known as: EPI-PEN Inject 0.3 mg into the muscle as needed for anaphylaxis. As needed for life-threatening allergic reactions   famotidine 40 MG tablet Commonly known as: PEPCID Take 1 tablet (40 mg total) by mouth 2 (two) times daily.   fluticasone 50 MCG/ACT nasal spray Commonly known as: FLONASE Place 1 spray into both nostrils at bedtime.   gabapentin 300 MG capsule Commonly known as: NEURONTIN TAKE 3 CAPSULES IN THE MORNING, 3 CAPSULES AT LUNCH, AND 3 CAPSULES ARE BEDTIME   guaiFENesin-codeine 100-10 MG/5ML syrup Take 5 mLs by mouth 2 (two) times daily.   hydrOXYzine 50 MG tablet Commonly known as: ATARAX Take 50 mg by mouth 3 (three) times daily.   lidocaine 5 % Commonly known as: LIDODERM PLACE 3 PATCHES ONTO THE SKIN DAILY. REMOVE & DISCARD PATCH WITHIN 12 HOURS OR AS DIRECTED BY MD   metaxalone 800 MG tablet Commonly known as: SKELAXIN TAKE 1 TABLET (800 MG TOTAL) BY MOUTH 3 (THREE) TIMES DAILY. AS NEEDED   metoprolol succinate 100 MG 24 hr tablet Commonly known as: TOPROL-XL Take 100 mg by  mouth daily.   olmesartan 40 MG tablet Commonly known as: BENICAR Take 40 mg by mouth daily.   omeprazole 20 MG capsule Commonly known as: PRILOSEC TAKE 1 CAPSULE BY MOUTH EVERY DAY   oxyCODONE 5 MG immediate release tablet Commonly known as: Roxicodone Take 1 tablet (5 mg total) by mouth 3 (three) times daily as needed for breakthrough pain.   phentermine 37.5 MG tablet Commonly known as: ADIPEX-P Take 18.75 mg by mouth every morning.   predniSONE 10 MG (48) Tbpk tablet Commonly known as: STERAPRED UNI-PAK 48 TAB Take by mouth as directed.   promethazine 25 MG tablet Commonly known as: PHENERGAN Take 25 mg by mouth every 6  (six) hours as needed for nausea or vomiting.   QUEtiapine 50 MG Tb24 24 hr tablet Commonly known as: SEROQUEL XR Take 50 mg by mouth.   sertraline 100 MG tablet Commonly known as: ZOLOFT Take 100 mg by mouth daily.   tadalafil 5 MG tablet Commonly known as: CIALIS Take 5 mg by mouth daily as needed for erectile dysfunction.   triamcinolone cream 0.1 % Commonly known as: KENALOG Apply 1 Application topically 2 (two) times daily.   valACYclovir 1000 MG tablet Commonly known as: VALTREX Take 1 tablet (1,000 mg total) by mouth 3 (three) times daily for 7 days.   Xtampza ER 36 MG C12a Generic drug: oxyCODONE ER Take 1 capsule (36 mg total) by mouth 2 (two) times daily. For chronic pain    Past Medical History:  Diagnosis Date   Anxiety and depression 08/01/2013   Cancer (HCC)    Cellulitis    left leg and stomach   Chicken pox    Chronic pain    Diarrhea 08/01/2013   History of kidney cancer    Hx of vasectomy    Hypertension    Renal cell carcinoma (HCC) 06/01/2013    Past Surgical History:  Procedure Laterality Date   COLONOSCOPY WITH PROPOFOL N/A 08/19/2013   Procedure: COLONOSCOPY WITH PROPOFOL;  Surgeon: Rachael Fee, MD;  Location: [facility];  Service: Endoscopy;  Laterality: N/A;   ESOPHAGOGASTRODUODENOSCOPY (EGD) WITH PROPOFOL N/A 08/19/2013   Procedure: ESOPHAGOGASTRODUODENOSCOPY (EGD) WITH PROPOFOL;  Surgeon: Rachael Fee, MD;  Location: [facility];  Service: Endoscopy;  Laterality: N/A;   IR RADIOLOGIST EVAL & MGMT  05/20/2019   KNEE ARTHROSCOPY Left 07/13/2020   Procedure: left knee arthroscopy, debridement, cyst decompression;  Surgeon: Cammy Copa, MD;  Location: [facility];  Service: Orthopedics;  Laterality: Left;   ORIF MANDIBULAR FRACTURE N/A 02/16/2016   Procedure: OPEN REDUCTION INTERNAL FIXATION (ORIF) MANDIBULAR FRACTURE;  Surgeon: Suzanna Obey, MD;  Location: [facility] OR;  Service: ENT;  Laterality: N/A;   RIB PLATING Left  02/20/2016   Procedure: LEFT RIB PLATING;  Surgeon: Kerin Perna, MD;  Location: [facility] OR;  Service: Thoracic;  Laterality: Left;   ROBOTIC ASSITED PARTIAL NEPHRECTOMY Left 06/17/2013   Procedure: ROBOTIC ASSITED PARTIAL NEPHRECTOMY;  Surgeon: Crecencio Mc, MD;  Location: [facility];  Service: Urology;  Laterality: Left;   TRACHEOSTOMY TUBE PLACEMENT N/A 02/16/2016   Procedure: TRACHEOSTOMY;  Surgeon: Suzanna Obey, MD;  [facility];  Service: ENT;  Laterality: N/A;   VASECTOMY  2012   WISDOM TOOTH EXTRACTION  middle school   WRIST SURGERY Left middle school   "arteries and nerves tangled up"    Review of systems negative except as noted in HPI / PMHx or noted below:  Review of Systems  Constitutional: Negative.  HENT: Negative.    Eyes: Negative.   Respiratory: Negative.    Cardiovascular: Negative.   Gastrointestinal: Negative.   Genitourinary: Negative.   Musculoskeletal: Negative.   Skin: Negative.   Neurological: Negative.   Endo/Heme/Allergies: Negative.   Psychiatric/Behavioral: Negative.       Objective:   Vitals:   04/14/23 0817  BP: 136/82  Pulse: 96  Resp: 16  SpO2: 97%          Physical Exam Constitutional:      Appearance: He is not diaphoretic.  HENT:     Head: Normocephalic.     Right Ear: Tympanic membrane, ear canal and external ear normal.     Left Ear: Ear canal and external ear normal.     Ears:     Comments: NonErythematous cluster of vesicles left tympanic membrane    Nose: Nose normal. No mucosal edema or rhinorrhea.     Mouth/Throat:     Pharynx: Uvula midline. No oropharyngeal exudate.  Eyes:     Conjunctiva/sclera: Conjunctivae normal.  Neck:     Thyroid: No thyromegaly.     Trachea: Trachea normal. No tracheal tenderness or tracheal deviation.  Cardiovascular:     Rate and Rhythm: Normal rate and regular rhythm.     Heart sounds: Normal heart sounds, S1 normal and S2 normal. No murmur heard. Pulmonary:     Effort: No respiratory  distress.     Breath sounds: Normal breath sounds. No stridor. No wheezing or rales.  Lymphadenopathy:     Head:     Right side of head: No tonsillar adenopathy.     Left side of head: No tonsillar adenopathy.     Cervical: No cervical adenopathy.  Skin:    Findings: Rash (diffuse brown freckles. resolution of previous right shoulder dermatitis. non indurated erythema of face and neck) present. No erythema.     Nails: There is no clubbing.  Neurological:     Mental Status: He is alert.     Diagnostics: Results of a blood tests obtained 10 April 2023 identified WBC 13.4, absolute eosinophil 1300, absolute lymphocyte 3200, absolute monocyte 1000, absolute basophil 100, hemoglobin 15.2, platelet 303.  Assessment and Plan:   1. Primary hypertension   2. Other eosinophilia     1. Visit with Dermatology about using topicals for face today. Maybe biopsy of skin for urticaria pigmentosa???  2. Every day:   A. Cetirizine 10 mg - 2 tablets 2 times per day  B. Famotidine 40 mg - 1 tablet 2 times per day  3. Begin taper of Prednisone 10 mg:   A. 1 tablet in AM + 1/2 tablet in PM x 5 days, then  B. 1 tablet in AM x 10 days, then   C. 1/2 tablet in AM x 10 days, then discontinue  4. Finish Valtrex 1000 mg - 3 times a day for 7 days  5. If needed: Epi-Pen, benadryl, MD/ER evaluation  6. Minimize sun exposure as much as possible - hat   7. Return in 3 weeks or earlier if problem  8. Will need to repeat CBC w/d in one month.   Marlan appears to be doing better and we will have him continue to utilize the therapy noted above with a slow taper of his systemic steroids.  He has a fair amount of photosensitivity and he needs to remain away from sun exposure is much as possible.  The etiologic factor contributing to his hypersensitivity is not known but certainly could  be part of his primary issue or may be secondary to the use of his doxycycline which he fortunately discontinued 4 days ago.   He has significant eosinophilia and we will repeat that CBC in 1 month.  He will be seeing dermatology today and I would like for them to consider obtaining a biopsy of his skin for possible urticaria pigmentosa as he does have very diffuse macules across his body.  I will see him back in this clinic in 3 weeks.  Laurette Schimke, MD Allergy / Immunology [facility]

## 2023-04-15 NOTE — Telephone Encounter (Signed)
I called and left [name] a message that Dr Berline Chough is not going to refill his oxycodone until he is seen by Jacalyn Lefevre NP, since he was late and missed his appt yesterday. I left him the available appointments for Thursday 04/17/23:  9 am, 11 am, 120 pm and 3 pm. Those are the only availabilities and I encouraged him to call and schedule one of those times if he is to get the refill completed on his oxycodone IR

## 2023-04-15 NOTE — Telephone Encounter (Signed)
Appt scheduled with Riley Lam.

## 2023-04-17 ENCOUNTER — Encounter: Payer: Self-pay | Admitting: Registered Nurse

## 2023-04-17 ENCOUNTER — Encounter: Payer: Medicaid Other | Attending: Registered Nurse | Admitting: Registered Nurse

## 2023-04-17 VITALS — BP 131/92 | HR 102 | Ht 74.0 in | Wt 326.4 lb

## 2023-04-17 DIAGNOSIS — Z5181 Encounter for therapeutic drug level monitoring: Secondary | ICD-10-CM | POA: Diagnosis present

## 2023-04-17 DIAGNOSIS — Z79891 Long term (current) use of opiate analgesic: Secondary | ICD-10-CM | POA: Insufficient documentation

## 2023-04-17 DIAGNOSIS — M546 Pain in thoracic spine: Secondary | ICD-10-CM | POA: Insufficient documentation

## 2023-04-17 DIAGNOSIS — G8929 Other chronic pain: Secondary | ICD-10-CM | POA: Insufficient documentation

## 2023-04-17 DIAGNOSIS — M5416 Radiculopathy, lumbar region: Secondary | ICD-10-CM | POA: Insufficient documentation

## 2023-04-17 DIAGNOSIS — G894 Chronic pain syndrome: Secondary | ICD-10-CM | POA: Insufficient documentation

## 2023-04-17 DIAGNOSIS — M7918 Myalgia, other site: Secondary | ICD-10-CM | POA: Insufficient documentation

## 2023-04-17 MED ORDER — XTAMPZA ER 36 MG PO C12A: 36.0000 mg | EXTENDED_RELEASE_CAPSULE | Freq: Two times a day (BID) | ORAL | 0 refills | Status: DC

## 2023-04-17 MED ORDER — OXYCODONE HCL 5 MG PO TABS: 5.0000 mg | ORAL_TABLET | Freq: Three times a day (TID) | ORAL | 0 refills | Status: DC | PRN

## 2023-04-17 NOTE — Progress Notes (Signed)
Subjective:    Patient ID: [name], male    DOB: 11/01/1975, 47 y.o.   MRN: [id number]  HPI: [name] is a 47 y.o. male who returns for follow up appointment for chronic pain and medication refill. He states his pain is located in his mid- lower back pain radiating into his left lower extremity. He  rates his pain 7. His current exercise regime is walking and performing stretching exercises.  He went to [facility] room on 04/08/2023, for rash. Note was reviewed.   On 04/10/2023, he seen Dr Lucie Leather, note was reviewed.   [name] Morphine equivalent is 120.00 MME.   Last UDS was Performed on 01/06/2023, it was consistent.     Pain Inventory Average Pain 7 Pain Right Now 7 My pain is sharp, burning, dull, stabbing, and aching  In the last 24 hours, has pain interfered with the following? General activity 5 Relation with others 5 Enjoyment of life 5 What TIME of day is your pain at its worst? morning , daytime, and evening Sleep (in general) NA  Pain is worse with: walking, bending, sitting, standing, and some activites Pain improves with: rest and medication Relief from Meds: 8  Family History  Problem Relation Age of Onset   Alcohol abuse Mother    Cirrhosis Father    Colitis Father    Heart disease Father    Asthma Father    Other Father        Chemical Imbalance   Colon polyps Sister        intestinal problems   Other Brother        Intestinal Fissure   Other Brother        Chemical Imbalance   Diabetes Maternal Grandfather    Prostate cancer Paternal Grandfather    Asthma Son    Heart attack Other        Paternal Grandparents   Stroke Other        Paternal Grandparents   Social History   Socioeconomic History   Marital status: Divorced    Spouse name: Not on file   Number of children: 3   Years of education: Not on file   Highest education level: Not on file  Occupational History   Not on file  Tobacco Use   Smoking status:  Every Day    Packs/day: 1.00    Years: 24.00    Additional pack years: 0.00    Total pack years: 24.00    Types: Cigarettes   Smokeless tobacco: Never  Vaping Use   Vaping Use: Never used  Substance and Sexual Activity   Alcohol use: Not Currently    Alcohol/week: 0.0 standard drinks of alcohol   Drug use: No   Sexual activity: Not on file  Other Topics Concern   Not on file  Social History Narrative   ** Merged History Encounter **          Right handed    Lives on 2nd floor   Social Determinants of Health   Financial Resource Strain: Not on file  Food Insecurity: Not on file  Transportation Needs: Not on file  Physical Activity: Not on file  Stress: Not on file  Social Connections: Not on file   Past Surgical History:  Procedure Laterality Date   COLONOSCOPY WITH PROPOFOL N/A 08/19/2013   Procedure: COLONOSCOPY WITH PROPOFOL;  Surgeon: Rachael Fee, MD;  [facility]: [facility];  Service: Endoscopy;  Laterality: N/A;   ESOPHAGOGASTRODUODENOSCOPY (  EGD) WITH PROPOFOL N/A 08/19/2013   Procedure: ESOPHAGOGASTRODUODENOSCOPY (EGD) WITH PROPOFOL;  Surgeon: Rachael Fee, MD;  [facility];  Service: Endoscopy;  Laterality: N/A;   IR RADIOLOGIST EVAL & MGMT  05/20/2019   KNEE ARTHROSCOPY Left 07/13/2020   Procedure: left knee arthroscopy, debridement, cyst decompression;  Surgeon: Cammy Copa, MD;  Location: [facility];  Service: Orthopedics;  Laterality: Left;   ORIF MANDIBULAR FRACTURE N/A 02/16/2016   Procedure: OPEN REDUCTION INTERNAL FIXATION (ORIF) MANDIBULAR FRACTURE;  Surgeon: Suzanna Obey, MD;  Location: [facility] OR;  Service: ENT;  Laterality: N/A;   RIB PLATING Left 02/20/2016   Procedure: LEFT RIB PLATING;  Surgeon: Kerin Perna, MD;  Location: [facility] OR;  Service: Thoracic;  Laterality: Left;   ROBOTIC ASSITED PARTIAL NEPHRECTOMY Left 06/17/2013   Procedure: ROBOTIC ASSITED PARTIAL NEPHRECTOMY;  Surgeon: Crecencio Mc, MD;  [facility];   Service: Urology;  Laterality: Left;   TRACHEOSTOMY TUBE PLACEMENT N/A 02/16/2016   Procedure: TRACHEOSTOMY;  Surgeon: Suzanna Obey, MD;  Location: [facility] OR;  Service: ENT;  Laterality: N/A;   VASECTOMY  2012   WISDOM TOOTH EXTRACTION  middle school   WRIST SURGERY Left middle school   "arteries and nerves tangled up"   Past Surgical History:  Procedure Laterality Date   COLONOSCOPY WITH PROPOFOL N/A 08/19/2013   Procedure: COLONOSCOPY WITH PROPOFOL;  Surgeon: Rachael Fee, MD;  [facility];  Service: Endoscopy;  Laterality: N/A;   ESOPHAGOGASTRODUODENOSCOPY (EGD) WITH PROPOFOL N/A 08/19/2013   Procedure: ESOPHAGOGASTRODUODENOSCOPY (EGD) WITH PROPOFOL;  Surgeon: Rachael Fee, MD;  [facility];  Service: Endoscopy;  Laterality: N/A;   IR RADIOLOGIST EVAL & MGMT  05/20/2019   KNEE ARTHROSCOPY Left 07/13/2020   Procedure: left knee arthroscopy, debridement, cyst decompression;  Surgeon: Cammy Copa, MD;  Location: [facility];  Service: Orthopedics;  Laterality: Left;   ORIF MANDIBULAR FRACTURE N/A 02/16/2016   Procedure: OPEN REDUCTION INTERNAL FIXATION (ORIF) MANDIBULAR FRACTURE;  Surgeon: Suzanna Obey, MD;  Location[facility] OR;  Service: ENT;  Laterality: N/A;   RIB PLATING Left 02/20/2016   Procedure: LEFT RIB PLATING;  Surgeon: Kerin Perna, MD;  Location: [facility] OR;  Service: Thoracic;  Laterality: Left;   ROBOTIC ASSITED PARTIAL NEPHRECTOMY Left 06/17/2013   Procedure: ROBOTIC ASSITED PARTIAL NEPHRECTOMY;  Surgeon: Crecencio Mc, MD;  [facility];  Service: Urology;  Laterality: Left;   TRACHEOSTOMY TUBE PLACEMENT N/A 02/16/2016   Procedure: TRACHEOSTOMY;  Surgeon: Suzanna Obey, MD;  Location: [facility] OR;  Service: ENT;  Laterality: N/A;   VASECTOMY  2012   WISDOM TOOTH EXTRACTION  middle school   WRIST SURGERY Left middle school   "arteries and nerves tangled up"   Past Medical History:  Diagnosis Date   Anxiety and depression 08/01/2013   Cancer (HCC)     Cellulitis    left leg and stomach   Chicken pox    Chronic pain    Diarrhea 08/01/2013   History of kidney cancer    Hx of vasectomy    Hypertension    Renal cell carcinoma (HCC) 06/01/2013   BP (!) 131/92   Pulse (!) 102   Ht 6\' 2"  (1.88 m)   Wt (!) 326 lb 6.4 oz (148.1 kg)   SpO2 96%   BMI 41.91 kg/m   Opioid Risk Score:   Fall Risk Score:  `1  Depression screen [facility] 2/9     09/16/2022    1:51 PM 06/28/2022  2:49 PM 10/01/2021    2:49 PM 07/02/2021    2:26 PM 11/19/2019    9:11 AM 07/19/2019    3:02 PM 04/05/2019    9:54 AM  Depression screen PHQ 2/9  Decreased Interest 0 0 1 1 2 2  0  Down, Depressed, Hopeless 0 0 3 1 2 2 1   PHQ - 2 Score 0 0 4 2 4 4 1   Altered sleeping     2 2 0  Tired, decreased energy     3 2 1   Change in appetite     2 1 0  Feeling bad or failure about yourself      2 2 0  Trouble concentrating     2 2 0  Moving slowly or fidgety/restless     2 2 0  Suicidal thoughts     0 0 0  PHQ-9 Score     17 15 2   Difficult doing work/chores     Very difficult Very difficult Somewhat difficult     Review of Systems  Constitutional: Negative.   HENT: Negative.    Respiratory: Negative.    Cardiovascular: Negative.   Gastrointestinal: Negative.   Endocrine: Negative.   Genitourinary: Negative.   Musculoskeletal:  Positive for back pain.  Skin: Negative.   Neurological: Negative.   Hematological: Negative.   Psychiatric/Behavioral: Negative.    All other systems reviewed and are negative.      Objective:   Physical Exam Vitals and nursing note reviewed.  Constitutional:      Appearance: Normal appearance.  Cardiovascular:     Rate and Rhythm: Normal rate and regular rhythm.     Pulses: Normal pulses.     Heart sounds: Normal heart sounds.  Pulmonary:     Effort: Pulmonary effort is normal.     Breath sounds: Normal breath sounds.  Musculoskeletal:     Cervical back: Normal range of motion and neck supple.     Comments: Normal Muscle  Bulk and Muscle Testing Reveals:  Upper Extremities: Full ROM and Muscle Strength 5/5 Left AC Joint Tenderness  Thoracic Paraspinal Tenderness: T-4-T-6 Lumbar Paraspinal Tenderness: L-3-L-5 Lower Extremities: Full ROM and Muscle Strength 5/5 Bilateral Lower Extremities Flexion Produces Pain into his Lumbar Arises from Table slowly Antalgic Gait     Skin:    General: Skin is warm and dry.  Neurological:     Mental Status: He is alert and oriented to person, place, and time.  Psychiatric:        Mood and Affect: Mood normal.        Behavior: Behavior normal.         Assessment & Plan:  Lumbar Radiculitis: Continue Gabapentin. Continue HEP as Tolerated . Continue to Monitor. 04/17/2023 2. Chronic Post Thoracotomy Pain: No complaints today. Continue current medication regimen. Continue to monitor. 04/17/2023 3. Chronic Pain Syndrome: Refilled: Xtampza 36 mg every 12 hours #60 and Oxycodone 5mg  three times a day as needed for pain #90. We will continue the opioid monitoring program, this consists of regular clinic visits, examinations, urine drug screen, pill counts as well as use of [place] Controlled Substance Reporting system. A 12 month History has been reviewed on the [place] Controlled Substance Reporting System on 04/17/2023.  3. Myofascial Pain: Continue current medication regimen. Continue to Monitor. 04/17/2023    F/U with Dr Berline Chough

## 2023-04-21 ENCOUNTER — Encounter: Payer: Self-pay | Admitting: Allergy and Immunology

## 2023-04-21 ENCOUNTER — Telehealth: Payer: Self-pay | Admitting: Allergy and Immunology

## 2023-04-21 ENCOUNTER — Telehealth: Payer: Self-pay | Admitting: *Deleted

## 2023-04-21 NOTE — Telephone Encounter (Signed)
Outcome Approved today Request Reference Number: [id number]. OXYCODONE TAB 5MG  is approved through 10/22/2023. Your patient may now fill this prescription and it will be covered. Authorization Expiration Date: 10/22/2023

## 2023-04-21 NOTE — Telephone Encounter (Signed)
Note completed and given to patient.

## 2023-04-21 NOTE — Telephone Encounter (Signed)
Patient states he went to work today but they did send him back home. He would like to know if he can extend his work note to today and to go back tomorrow. His job requires the note to say that he needs to be on light duty as Dr. Lucie Leather requested. He does need to take this note to work tomorrow.

## 2023-04-21 NOTE — Telephone Encounter (Signed)
PA submitted Key [id number].

## 2023-05-07 ENCOUNTER — Inpatient Hospital Stay (HOSPITAL_COMMUNITY): Payer: Medicaid Other | Admitting: Anesthesiology

## 2023-05-07 ENCOUNTER — Emergency Department (HOSPITAL_COMMUNITY): Payer: Medicaid Other

## 2023-05-07 ENCOUNTER — Other Ambulatory Visit: Payer: Self-pay

## 2023-05-07 ENCOUNTER — Encounter (HOSPITAL_COMMUNITY)
Admission: EM | Disposition: A | Discharge status: Home / Self Care | Payer: Self-pay | Source: Home / Self Care | Attending: Internal Medicine

## 2023-05-07 ENCOUNTER — Inpatient Hospital Stay (HOSPITAL_COMMUNITY): Payer: Medicaid Other

## 2023-05-07 ENCOUNTER — Inpatient Hospital Stay (HOSPITAL_COMMUNITY)
Admission: EM | Admit: 2023-05-07 | Discharge: 2023-05-11 | DRG: 915 | Disposition: A | Discharge status: Home / Self Care | Payer: Medicaid Other | Attending: Internal Medicine | Admitting: Internal Medicine

## 2023-05-07 DIAGNOSIS — T380X5A Adverse effect of glucocorticoids and synthetic analogues, initial encounter: Secondary | ICD-10-CM | POA: Diagnosis present

## 2023-05-07 DIAGNOSIS — F4310 Post-traumatic stress disorder, unspecified: Secondary | ICD-10-CM | POA: Diagnosis present

## 2023-05-07 DIAGNOSIS — Z79899 Other long term (current) drug therapy: Secondary | ICD-10-CM

## 2023-05-07 DIAGNOSIS — Z6841 Body Mass Index (BMI) 40.0 and over, adult: Secondary | ICD-10-CM

## 2023-05-07 DIAGNOSIS — J9601 Acute respiratory failure with hypoxia: Secondary | ICD-10-CM | POA: Diagnosis present

## 2023-05-07 DIAGNOSIS — F411 Generalized anxiety disorder: Secondary | ICD-10-CM | POA: Diagnosis present

## 2023-05-07 DIAGNOSIS — Z811 Family history of alcohol abuse and dependence: Secondary | ICD-10-CM

## 2023-05-07 DIAGNOSIS — Z825 Family history of asthma and other chronic lower respiratory diseases: Secondary | ICD-10-CM

## 2023-05-07 DIAGNOSIS — H5789 Other specified disorders of eye and adnexa: Secondary | ICD-10-CM | POA: Diagnosis present

## 2023-05-07 DIAGNOSIS — T63461A Toxic effect of venom of wasps, accidental (unintentional), initial encounter: Secondary | ICD-10-CM | POA: Diagnosis present

## 2023-05-07 DIAGNOSIS — F17210 Nicotine dependence, cigarettes, uncomplicated: Secondary | ICD-10-CM | POA: Diagnosis present

## 2023-05-07 DIAGNOSIS — D72829 Elevated white blood cell count, unspecified: Secondary | ICD-10-CM | POA: Diagnosis not present

## 2023-05-07 DIAGNOSIS — Z720 Tobacco use: Secondary | ICD-10-CM | POA: Diagnosis present

## 2023-05-07 DIAGNOSIS — N179 Acute kidney failure, unspecified: Secondary | ICD-10-CM | POA: Diagnosis not present

## 2023-05-07 DIAGNOSIS — E1165 Type 2 diabetes mellitus with hyperglycemia: Secondary | ICD-10-CM | POA: Diagnosis not present

## 2023-05-07 DIAGNOSIS — I10 Essential (primary) hypertension: Secondary | ICD-10-CM

## 2023-05-07 DIAGNOSIS — E119 Type 2 diabetes mellitus without complications: Secondary | ICD-10-CM | POA: Diagnosis not present

## 2023-05-07 DIAGNOSIS — E669 Obesity, unspecified: Secondary | ICD-10-CM | POA: Diagnosis present

## 2023-05-07 DIAGNOSIS — F32A Depression, unspecified: Secondary | ICD-10-CM | POA: Diagnosis present

## 2023-05-07 DIAGNOSIS — F419 Anxiety disorder, unspecified: Secondary | ICD-10-CM | POA: Diagnosis not present

## 2023-05-07 DIAGNOSIS — T783XXA Angioneurotic edema, initial encounter: Secondary | ICD-10-CM | POA: Diagnosis present

## 2023-05-07 DIAGNOSIS — Z8042 Family history of malignant neoplasm of prostate: Secondary | ICD-10-CM

## 2023-05-07 DIAGNOSIS — G894 Chronic pain syndrome: Secondary | ICD-10-CM | POA: Diagnosis present

## 2023-05-07 DIAGNOSIS — R0789 Other chest pain: Secondary | ICD-10-CM | POA: Diagnosis present

## 2023-05-07 DIAGNOSIS — J988 Other specified respiratory disorders: Secondary | ICD-10-CM

## 2023-05-07 DIAGNOSIS — R490 Dysphonia: Secondary | ICD-10-CM | POA: Diagnosis not present

## 2023-05-07 DIAGNOSIS — Z823 Family history of stroke: Secondary | ICD-10-CM | POA: Diagnosis not present

## 2023-05-07 DIAGNOSIS — Z85528 Personal history of other malignant neoplasm of kidney: Secondary | ICD-10-CM

## 2023-05-07 DIAGNOSIS — Z833 Family history of diabetes mellitus: Secondary | ICD-10-CM

## 2023-05-07 DIAGNOSIS — Z91030 Bee allergy status: Secondary | ICD-10-CM

## 2023-05-07 DIAGNOSIS — R070 Pain in throat: Secondary | ICD-10-CM | POA: Diagnosis present

## 2023-05-07 DIAGNOSIS — Z7984 Long term (current) use of oral hypoglycemic drugs: Secondary | ICD-10-CM

## 2023-05-07 DIAGNOSIS — Z905 Acquired absence of kidney: Secondary | ICD-10-CM

## 2023-05-07 DIAGNOSIS — T782XXA Anaphylactic shock, unspecified, initial encounter: Secondary | ICD-10-CM | POA: Diagnosis not present

## 2023-05-07 DIAGNOSIS — T63481A Toxic effect of venom of other arthropod, accidental (unintentional), initial encounter: Secondary | ICD-10-CM | POA: Diagnosis not present

## 2023-05-07 DIAGNOSIS — R079 Chest pain, unspecified: Secondary | ICD-10-CM | POA: Diagnosis not present

## 2023-05-07 DIAGNOSIS — Z83719 Family history of colon polyps, unspecified: Secondary | ICD-10-CM

## 2023-05-07 DIAGNOSIS — T63441A Toxic effect of venom of bees, accidental (unintentional), initial encounter: Secondary | ICD-10-CM | POA: Diagnosis present

## 2023-05-07 DIAGNOSIS — Z8249 Family history of ischemic heart disease and other diseases of the circulatory system: Secondary | ICD-10-CM

## 2023-05-07 DIAGNOSIS — F418 Other specified anxiety disorders: Secondary | ICD-10-CM | POA: Diagnosis not present

## 2023-05-07 DIAGNOSIS — Z91038 Other insect allergy status: Secondary | ICD-10-CM

## 2023-05-07 DIAGNOSIS — R000 Tachycardia, unspecified: Secondary | ICD-10-CM | POA: Diagnosis present

## 2023-05-07 HISTORY — PX: INTUBATION-ENDOTRACHEAL WITH TRACHEOSTOMY STANDBY: SHX6592

## 2023-05-07 LAB — POCT I-STAT 7, (LYTES, BLD GAS, ICA,H+H)
Acid-base deficit: 8 mmol/L — ABNORMAL HIGH (ref 0.0–2.0)
Bicarbonate: 21.6 mmol/L (ref 20.0–28.0)
Calcium, Ion: 1.21 mmol/L (ref 1.15–1.40)
HCT: 38 % — ABNORMAL LOW (ref 39.0–52.0)
Hemoglobin: 12.9 g/dL — ABNORMAL LOW (ref 13.0–17.0)
O2 Saturation: 92 %
Patient temperature: 99.9
Potassium: 3.8 mmol/L (ref 3.5–5.1)
Sodium: 132 mmol/L — ABNORMAL LOW (ref 135–145)
TCO2: 23 mmol/L (ref 22–32)
pCO2 arterial: 64 mmHg — ABNORMAL HIGH (ref 32–48)
pH, Arterial: 7.141 — CL (ref 7.35–7.45)
pO2, Arterial: 87 mmHg (ref 83–108)

## 2023-05-07 LAB — COMPREHENSIVE METABOLIC PANEL
ALT: 34 U/L (ref 0–44)
AST: 44 U/L — ABNORMAL HIGH (ref 15–41)
Albumin: 3.7 g/dL (ref 3.5–5.0)
Alkaline Phosphatase: 59 U/L (ref 38–126)
Anion gap: 11 (ref 5–15)
BUN: 35 mg/dL — ABNORMAL HIGH (ref 6–20)
CO2: 21 mmol/L — ABNORMAL LOW (ref 22–32)
Calcium: 8.6 mg/dL — ABNORMAL LOW (ref 8.9–10.3)
Chloride: 96 mmol/L — ABNORMAL LOW (ref 98–111)
Creatinine, Ser: 2.04 mg/dL — ABNORMAL HIGH (ref 0.61–1.24)
GFR, Estimated: 39 mL/min — ABNORMAL LOW (ref >60)
Glucose, Bld: 166 mg/dL — ABNORMAL HIGH (ref 70–99)
Potassium: 4.2 mmol/L (ref 3.5–5.1)
Sodium: 128 mmol/L — ABNORMAL LOW (ref 135–145)
Total Bilirubin: 0.5 mg/dL (ref 0.3–1.2)
Total Protein: 6.4 g/dL — ABNORMAL LOW (ref 6.5–8.1)

## 2023-05-07 LAB — I-STAT VENOUS BLOOD GAS, ED
Acid-base deficit: 5 mmol/L — ABNORMAL HIGH (ref 0.0–2.0)
Bicarbonate: 21.9 mmol/L (ref 20.0–28.0)
Calcium, Ion: 1.19 mmol/L (ref 1.15–1.40)
HCT: 36 % — ABNORMAL LOW (ref 39.0–52.0)
Hemoglobin: 12.2 g/dL — ABNORMAL LOW (ref 13.0–17.0)
O2 Saturation: 99 %
Potassium: 4.3 mmol/L (ref 3.5–5.1)
Sodium: 130 mmol/L — ABNORMAL LOW (ref 135–145)
TCO2: 23 mmol/L (ref 22–32)
pCO2, Ven: 47.5 mmHg (ref 44–60)
pH, Ven: 7.271 (ref 7.25–7.43)
pO2, Ven: 153 mmHg — ABNORMAL HIGH (ref 32–45)

## 2023-05-07 LAB — CBC WITH DIFFERENTIAL/PLATELET
Abs Immature Granulocytes: 0.12 10*3/uL — ABNORMAL HIGH (ref 0.00–0.07)
Basophils Absolute: 0.1 10*3/uL (ref 0.0–0.1)
Basophils Relative: 1 %
Eosinophils Absolute: 1.6 10*3/uL — ABNORMAL HIGH (ref 0.0–0.5)
Eosinophils Relative: 9 %
HCT: 34.6 % — ABNORMAL LOW (ref 39.0–52.0)
Hemoglobin: 11.6 g/dL — ABNORMAL LOW (ref 13.0–17.0)
Immature Granulocytes: 1 %
Lymphocytes Relative: 18 %
Lymphs Abs: 3.1 10*3/uL (ref 0.7–4.0)
MCH: 30.7 pg (ref 26.0–34.0)
MCHC: 33.5 g/dL (ref 30.0–36.0)
MCV: 91.5 fL (ref 80.0–100.0)
Monocytes Absolute: 1.5 10*3/uL — ABNORMAL HIGH (ref 0.1–1.0)
Monocytes Relative: 8 %
Neutro Abs: 11 10*3/uL — ABNORMAL HIGH (ref 1.7–7.7)
Neutrophils Relative %: 63 %
Platelets: 245 10*3/uL (ref 150–400)
RBC: 3.78 MIL/uL — ABNORMAL LOW (ref 4.22–5.81)
RDW: 14.4 % (ref 11.5–15.5)
WBC: 17.3 10*3/uL — ABNORMAL HIGH (ref 4.0–10.5)
nRBC: 0 % (ref 0.0–0.2)

## 2023-05-07 LAB — GLUCOSE, CAPILLARY
Glucose-Capillary: 273 mg/dL — ABNORMAL HIGH (ref 70–99)
Glucose-Capillary: 417 mg/dL — ABNORMAL HIGH (ref 70–99)
Glucose-Capillary: 422 mg/dL — ABNORMAL HIGH (ref 70–99)
Glucose-Capillary: 461 mg/dL — ABNORMAL HIGH (ref 70–99)
Glucose-Capillary: 375 mg/dL — ABNORMAL HIGH (ref 70–99)
Glucose-Capillary: 430 mg/dL — ABNORMAL HIGH (ref 70–99)
Glucose-Capillary: 363 mg/dL — ABNORMAL HIGH (ref 70–99)
Glucose-Capillary: 288 mg/dL — ABNORMAL HIGH (ref 70–99)
Glucose-Capillary: 279 mg/dL — ABNORMAL HIGH (ref 70–99)
Glucose-Capillary: 235 mg/dL — ABNORMAL HIGH (ref 70–99)

## 2023-05-07 LAB — MRSA NEXT GEN BY PCR, NASAL: MRSA by PCR Next Gen: NOT DETECTED

## 2023-05-07 LAB — MAGNESIUM: Magnesium: 2.2 mg/dL (ref 1.7–2.4)

## 2023-05-07 SURGERY — TRACHEOSTOMY
Anesthesia: General

## 2023-05-07 SURGERY — INTUBATION-ENDOTRACHEAL WITH TRACHEOSTOMY STANDBY
Anesthesia: General | Site: Mouth

## 2023-05-07 MED ORDER — DEXMEDETOMIDINE HCL IN NACL 400 MCG/100ML IV SOLN
0.0000 ug/kg/h | INTRAVENOUS | Status: DC
  Administered 2023-05-07 (×2): 0.5 ug/kg/h via INTRAVENOUS
  Administered 2023-05-08: 0.8 ug/kg/h via INTRAVENOUS
  Administered 2023-05-08: 0.3 ug/kg/h via INTRAVENOUS
  Filled 2023-05-07 (×5): qty 100

## 2023-05-07 MED ORDER — DEXMEDETOMIDINE HCL IN NACL 80 MCG/20ML IV SOLN
INTRAVENOUS | Status: DC | PRN
  Administered 2023-05-07: 12 ug via INTRAVENOUS

## 2023-05-07 MED ORDER — ORAL CARE MOUTH RINSE
15.0000 mL | OROMUCOSAL | Status: DC
  Administered 2023-05-07 – 2023-05-08 (×10): 15 mL via OROMUCOSAL

## 2023-05-07 MED ORDER — SUCCINYLCHOLINE CHLORIDE 200 MG/10ML IV SOSY
PREFILLED_SYRINGE | INTRAVENOUS | Status: AC
  Filled 2023-05-07: qty 10

## 2023-05-07 MED ORDER — ETOMIDATE 2 MG/ML IV SOLN
INTRAVENOUS | Status: AC
  Filled 2023-05-07: qty 20

## 2023-05-07 MED ORDER — PROPOFOL 500 MG/50ML IV EMUL
INTRAVENOUS | Status: DC | PRN
  Administered 2023-05-07: 100 ug/kg/min via INTRAVENOUS

## 2023-05-07 MED ORDER — MIDAZOLAM HCL 5 MG/5ML IJ SOLN
INTRAMUSCULAR | Status: DC | PRN
  Administered 2023-05-07 (×2): 1 mg via INTRAVENOUS

## 2023-05-07 MED ORDER — EPINEPHRINE 0.3 MG/0.3ML IJ SOAJ: 0.3000 mg | Freq: Once | INTRAMUSCULAR | Status: AC

## 2023-05-07 MED ORDER — INSULIN ASPART 100 UNIT/ML IJ SOLN: 0.0000 [IU] | INTRAMUSCULAR | Status: DC

## 2023-05-07 MED ORDER — MIDAZOLAM HCL 2 MG/2ML IJ SOLN
INTRAMUSCULAR | Status: AC
  Filled 2023-05-07: qty 2

## 2023-05-07 MED ORDER — DIPHENHYDRAMINE HCL 50 MG/ML IJ SOLN
50.0000 mg | Freq: Four times a day (QID) | INTRAMUSCULAR | Status: AC
  Administered 2023-05-07 – 2023-05-10 (×13): 50 mg via INTRAVENOUS
  Filled 2023-05-07 (×13): qty 1

## 2023-05-07 MED ORDER — FENTANYL CITRATE PF 50 MCG/ML IJ SOSY
50.0000 ug | PREFILLED_SYRINGE | Freq: Once | INTRAMUSCULAR | Status: AC
  Administered 2023-05-07: 50 ug via INTRAVENOUS
  Filled 2023-05-07: qty 1

## 2023-05-07 MED ORDER — CHLORHEXIDINE GLUCONATE 0.12 % MT SOLN
15.0000 mL | Freq: Once | OROMUCOSAL | Status: DC
  Filled 2023-05-07: qty 15

## 2023-05-07 MED ORDER — METHYLPREDNISOLONE SODIUM SUCC 125 MG IJ SOLR
125.0000 mg | INTRAMUSCULAR | Status: AC
  Administered 2023-05-07 – 2023-05-10 (×4): 125 mg via INTRAVENOUS
  Filled 2023-05-07 (×3): qty 2

## 2023-05-07 MED ORDER — FENTANYL CITRATE (PF) 250 MCG/5ML IJ SOLN
INTRAMUSCULAR | Status: AC
  Filled 2023-05-07: qty 5

## 2023-05-07 MED ORDER — ROCURONIUM BROMIDE 10 MG/ML (PF) SYRINGE
PREFILLED_SYRINGE | INTRAVENOUS | Status: DC | PRN
  Administered 2023-05-07: 60 mg via INTRAVENOUS

## 2023-05-07 MED ORDER — ORAL CARE MOUTH RINSE: 15.0000 mL | OROMUCOSAL | Status: DC | PRN

## 2023-05-07 MED ORDER — EPINEPHRINE 0.3 MG/0.3ML IJ SOAJ
INTRAMUSCULAR | Status: AC
  Administered 2023-05-07: 0.3 mg via INTRAMUSCULAR
  Filled 2023-05-07: qty 0.3

## 2023-05-07 MED ORDER — LIDOCAINE HCL (PF) 2% IJ FOR NEBU
5.0000 mL | Freq: Once | RESPIRATORY_TRACT | Status: DC | PRN
  Filled 2023-05-07: qty 5

## 2023-05-07 MED ORDER — FENTANYL CITRATE PF 50 MCG/ML IJ SOSY
50.0000 ug | PREFILLED_SYRINGE | INTRAMUSCULAR | Status: DC | PRN
  Administered 2023-05-08 (×4): 50 ug via INTRAVENOUS
  Filled 2023-05-07 (×5): qty 1

## 2023-05-07 MED ORDER — INSULIN REGULAR(HUMAN) IN NACL 100-0.9 UT/100ML-% IV SOLN
INTRAVENOUS | Status: DC
  Administered 2023-05-07: 7.5 [IU]/h via INTRAVENOUS
  Administered 2023-05-07: 19 [IU]/h via INTRAVENOUS
  Filled 2023-05-07 (×2): qty 100

## 2023-05-07 MED ORDER — PROPOFOL 10 MG/ML IV BOLUS
INTRAVENOUS | Status: AC
  Filled 2023-05-07: qty 20

## 2023-05-07 MED ORDER — DEXTROSE 50 % IV SOLN: 0.0000 mL | INTRAVENOUS | Status: DC | PRN

## 2023-05-07 MED ORDER — FAMOTIDINE IN NACL 20-0.9 MG/50ML-% IV SOLN
20.0000 mg | Freq: Once | INTRAVENOUS | Status: AC
  Administered 2023-05-07: 20 mg via INTRAVENOUS
  Filled 2023-05-07: qty 50

## 2023-05-07 MED ORDER — LACTATED RINGERS IV BOLUS
1000.0000 mL | Freq: Once | INTRAVENOUS | Status: AC
  Administered 2023-05-07: 1000 mL via INTRAVENOUS

## 2023-05-07 MED ORDER — ALBUTEROL SULFATE (2.5 MG/3ML) 0.083% IN NEBU
2.5000 mg | INHALATION_SOLUTION | Freq: Once | RESPIRATORY_TRACT | Status: AC
  Administered 2023-05-07: 2.5 mg via RESPIRATORY_TRACT
  Filled 2023-05-07: qty 3

## 2023-05-07 MED ORDER — FAMOTIDINE IN NACL 20-0.9 MG/50ML-% IV SOLN
20.0000 mg | Freq: Two times a day (BID) | INTRAVENOUS | Status: AC
  Administered 2023-05-07 – 2023-05-10 (×7): 20 mg via INTRAVENOUS
  Filled 2023-05-07 (×8): qty 50

## 2023-05-07 MED ORDER — OXYMETAZOLINE HCL 0.05 % NA SOLN
NASAL | Status: AC
  Filled 2023-05-07: qty 30

## 2023-05-07 MED ORDER — HYDROCORTISONE SOD SUC (PF) 100 MG IJ SOLR
100.0000 mg | Freq: Four times a day (QID) | INTRAMUSCULAR | Status: DC
  Administered 2023-05-07: 100 mg via INTRAVENOUS
  Filled 2023-05-07: qty 2

## 2023-05-07 MED ORDER — EPINEPHRINE HCL 5 MG/250ML IV SOLN IN NS
0.5000 ug/min | INTRAVENOUS | Status: DC
  Administered 2023-05-07: 10 ug/min via INTRAVENOUS
  Administered 2023-05-07: 9 ug/min via INTRAVENOUS
  Filled 2023-05-07 (×2): qty 250

## 2023-05-07 MED ORDER — ROCURONIUM BROMIDE 10 MG/ML (PF) SYRINGE
PREFILLED_SYRINGE | INTRAVENOUS | Status: AC
  Filled 2023-05-07: qty 10

## 2023-05-07 MED ORDER — SUCCINYLCHOLINE CHLORIDE 200 MG/10ML IV SOSY
PREFILLED_SYRINGE | INTRAVENOUS | Status: DC | PRN
  Administered 2023-05-07: 120 mg via INTRAVENOUS

## 2023-05-07 MED ORDER — ENOXAPARIN SODIUM 40 MG/0.4ML IJ SOSY
40.0000 mg | PREFILLED_SYRINGE | INTRAMUSCULAR | Status: DC
  Administered 2023-05-07: 40 mg via SUBCUTANEOUS
  Filled 2023-05-07: qty 0.4

## 2023-05-07 MED ORDER — PROPOFOL 1000 MG/100ML IV EMUL
INTRAVENOUS | Status: AC
  Administered 2023-05-07: 70 ug/kg/min via INTRAVENOUS
  Filled 2023-05-07: qty 100

## 2023-05-07 MED ORDER — PROPOFOL 10 MG/ML IV BOLUS
INTRAVENOUS | Status: DC | PRN
Start: 2023-05-07 — End: 2023-05-07
  Administered 2023-05-07: 150 mg via INTRAVENOUS

## 2023-05-07 MED ORDER — GLYCOPYRROLATE PF 0.2 MG/ML IJ SOSY
PREFILLED_SYRINGE | INTRAMUSCULAR | Status: AC
  Filled 2023-05-07: qty 1

## 2023-05-07 MED ORDER — FENTANYL CITRATE PF 50 MCG/ML IJ SOSY
PREFILLED_SYRINGE | INTRAMUSCULAR | Status: AC
  Filled 2023-05-07: qty 2

## 2023-05-07 MED ORDER — 0.9 % SODIUM CHLORIDE (POUR BTL) OPTIME
TOPICAL | Status: DC | PRN
  Administered 2023-05-07: 1000 mL

## 2023-05-07 MED ORDER — OXYMETAZOLINE HCL 0.05 % NA SOLN
NASAL | Status: DC | PRN
  Administered 2023-05-07: 3 via NASAL

## 2023-05-07 MED ORDER — PROPOFOL 1000 MG/100ML IV EMUL
5.0000 ug/kg/min | INTRAVENOUS | Status: DC
  Administered 2023-05-07: 40 ug/kg/min via INTRAVENOUS
  Administered 2023-05-07: 50 ug/kg/min via INTRAVENOUS
  Administered 2023-05-08 (×6): 40 ug/kg/min via INTRAVENOUS
  Filled 2023-05-07 (×6): qty 100
  Filled 2023-05-07: qty 200

## 2023-05-07 MED ORDER — LIDOCAINE HCL (PF) 2% IJ FOR NEBU
5.0000 mL | Freq: Once | RESPIRATORY_TRACT | Status: DC
  Filled 2023-05-07 (×2): qty 5

## 2023-05-07 MED ORDER — IPRATROPIUM-ALBUTEROL 0.5-2.5 (3) MG/3ML IN SOLN
3.0000 mL | RESPIRATORY_TRACT | Status: DC | PRN
  Administered 2023-05-10: 3 mL via RESPIRATORY_TRACT
  Filled 2023-05-07: qty 3

## 2023-05-07 MED ORDER — DOCUSATE SODIUM 100 MG PO CAPS
100.0000 mg | ORAL_CAPSULE | Freq: Two times a day (BID) | ORAL | Status: DC | PRN
  Filled 2023-05-07: qty 1

## 2023-05-07 MED ORDER — PROPOFOL 1000 MG/100ML IV EMUL
INTRAVENOUS | Status: AC
  Administered 2023-05-07: 80 ug/kg/min via INTRAVENOUS
  Filled 2023-05-07: qty 100

## 2023-05-07 MED ORDER — DEXTROSE IN LACTATED RINGERS 5 % IV SOLN: INTRAVENOUS | Status: DC

## 2023-05-07 MED ORDER — KETAMINE HCL 50 MG/5ML IJ SOSY
PREFILLED_SYRINGE | INTRAMUSCULAR | Status: AC
  Filled 2023-05-07: qty 5

## 2023-05-07 MED ORDER — POLYETHYLENE GLYCOL 3350 17 G PO PACK: 17.0000 g | PACK | Freq: Every day | ORAL | Status: DC | PRN

## 2023-05-07 MED ORDER — KETAMINE HCL 10 MG/ML IJ SOLN
INTRAMUSCULAR | Status: DC | PRN
  Administered 2023-05-07: 10 mg via INTRAVENOUS
  Administered 2023-05-07: 20 mg via INTRAVENOUS

## 2023-05-07 MED ORDER — LACTATED RINGERS IV SOLN: INTRAVENOUS | Status: DC

## 2023-05-07 MED ORDER — KETAMINE HCL 50 MG/5ML IJ SOSY
PREFILLED_SYRINGE | INTRAMUSCULAR | Status: AC
  Filled 2023-05-07: qty 10

## 2023-05-07 MED ORDER — GLYCOPYRROLATE 0.2 MG/ML IJ SOLN
INTRAMUSCULAR | Status: DC | PRN
  Administered 2023-05-07: .2 mg via INTRAVENOUS

## 2023-05-07 SURGICAL SUPPLY — 33 items
BAG COUNTER SPONGE SURGICOUNT (BAG) ×2 IMPLANT
BAG SPNG CNTER NS LX DISP (BAG) ×2
BLADE CLIPPER SURG (BLADE) IMPLANT
CANISTER SUCT 3000ML PPV (MISCELLANEOUS) ×2 IMPLANT
COVER SURGICAL LIGHT HANDLE (MISCELLANEOUS) ×2 IMPLANT
DRAPE UTILITY XL STRL (DRAPES) ×2 IMPLANT
DRSG TEGADERM 4X4.75 (GAUZE/BANDAGES/DRESSINGS) ×2 IMPLANT
DRSG TELFA 3X8 NADH STRL (GAUZE/BANDAGES/DRESSINGS) ×2 IMPLANT
ELECT CAUTERY BLADE 6.4 (BLADE) ×2 IMPLANT
ELECT REM PT RETURN 9FT ADLT (ELECTROSURGICAL) ×2
ELECTRODE REM PT RTRN 9FT ADLT (ELECTROSURGICAL) ×2 IMPLANT
GAUZE 4X4 16PLY ~~LOC~~+RFID DBL (SPONGE) ×2 IMPLANT
GLOVE BIO SURGEON STRL SZ 6.5 (GLOVE) ×2 IMPLANT
GLOVE BIOGEL PI IND STRL 6 (GLOVE) ×2 IMPLANT
GOWN STRL REUS W/ TWL LRG LVL3 (GOWN DISPOSABLE) ×4 IMPLANT
GOWN STRL REUS W/TWL LRG LVL3 (GOWN DISPOSABLE) ×4
KIT BASIN OR (CUSTOM PROCEDURE TRAY) ×2 IMPLANT
KIT TURNOVER KIT B (KITS) ×2 IMPLANT
NS IRRIG 1000ML POUR BTL (IV SOLUTION) ×2 IMPLANT
PACK EENT II TURBAN DRAPE (CUSTOM PROCEDURE TRAY) ×2 IMPLANT
PAD ARMBOARD 7.5X6 YLW CONV (MISCELLANEOUS) ×4 IMPLANT
PENCIL BUTTON HOLSTER BLD 10FT (ELECTRODE) ×2 IMPLANT
SPONGE INTESTINAL PEANUT (DISPOSABLE) ×2 IMPLANT
SUT SILK 2 0 SH (SUTURE) IMPLANT
SUT VIC AB 2-0 SH 27 (SUTURE)
SUT VIC AB 2-0 SH 27XBRD (SUTURE) IMPLANT
SUT VICRYL AB 3 0 TIES (SUTURE) ×2 IMPLANT
SYR 20ML LL LF (SYRINGE) ×2 IMPLANT
TOWEL GREEN STERILE (TOWEL DISPOSABLE) ×2 IMPLANT
TOWEL GREEN STERILE FF (TOWEL DISPOSABLE) ×2 IMPLANT
TRAY W/TRACH TUBE 7.5 (MISCELLANEOUS) IMPLANT
TRAY W/TRACH TUBE 8.5 (MISCELLANEOUS) IMPLANT
TUBE CONNECTING 12X1/4 (SUCTIONS) ×2 IMPLANT

## 2023-05-07 NOTE — Anesthesia Preprocedure Evaluation (Signed)
Anesthesia Evaluation  Patient identified by MRN, date of birth, ID bandPreop documentation limited or incomplete due to emergent nature of procedure.  Airway Mallampati: IV   Neck ROM: Full    Dental no notable dental hx.    Pulmonary neg pulmonary ROS, Current Smoker    + decreased breath sounds      Cardiovascular hypertension,  Rhythm:Regular Rate:Tachycardia     Neuro/Psych   Anxiety Depression       GI/Hepatic Neg liver ROS,GERD  Medicated,,  Endo/Other  negative endocrine ROS    Renal/GU   negative genitourinary   Musculoskeletal Angioedema airway emergency    Abdominal Normal abdominal exam  (+)   Peds  Hematology  (+) Blood dyscrasia, anemia   Anesthesia Other Findings Patient on BPAP upon arrival to OR. Tongue appears normal without edema or imminent airway compromise. Vitals stable.   Reproductive/Obstetrics                             Anesthesia Physical Anesthesia Plan  ASA: 3 and emergent  Anesthesia Plan: General   Post-op Pain Management:    Induction: Intravenous  PONV Risk Score and Plan: 1 and Treatment may vary due to age or medical condition  Airway Management Planned: Mask, Oral ETT, Video Laryngoscope Planned and Awake Intubation Planned  Additional Equipment: None  Intra-op Plan:   Post-operative Plan:   Informed Consent:      Only emergency history available  Plan Discussed with:   Anesthesia Plan Comments: (- will plan for awake fiberoptic with topicalization of oral and nasal cavity. )       Anesthesia Quick Evaluation

## 2023-05-07 NOTE — Consult Note (Signed)
[name] 1976/02/16  [id number].    Requesting MD: Agarwala Chief Complaint/Reason for Consult: emergent surgical airway  HPI:  [name] is a 47 y/o M with history of prior tracheostomy in 2017 who was admitted to ICU after a bee sting resulting in SOB and angioedema. Trauma surgery was consulted emergently to assess for possible surgical airway and need for intubation.  ROS: Review of Systems  Unable to perform ROS: Acuity of condition  Cardiovascular:  Positive for orthopnea.    Family History  Problem Relation Age of Onset   Alcohol abuse Mother    Cirrhosis Father    Colitis Father    Heart disease Father    Asthma Father    Other Father        Chemical Imbalance   Colon polyps Sister        intestinal problems   Other Brother        Intestinal Fissure   Other Brother        Chemical Imbalance   Diabetes Maternal Grandfather    Prostate cancer Paternal Grandfather    Asthma Son    Heart attack Other        Paternal Grandparents   Stroke Other        Paternal Grandparents    Past Medical History:  Diagnosis Date   Anxiety and depression 08/01/2013   Cancer (HCC)    Cellulitis    left leg and stomach   Chicken pox    Chronic pain    Diarrhea 08/01/2013   History of kidney cancer    Hx of vasectomy    Hypertension    Renal cell carcinoma (HCC) 06/01/2013    Past Surgical History:  Procedure Laterality Date   COLONOSCOPY WITH PROPOFOL N/A 08/19/2013   Procedure: COLONOSCOPY WITH PROPOFOL;  Surgeon: Rachael Fee, MD;  Location: [facility];  Service: Endoscopy;  Laterality: N/A;   ESOPHAGOGASTRODUODENOSCOPY (EGD) WITH PROPOFOL N/A 08/19/2013   Procedure: ESOPHAGOGASTRODUODENOSCOPY (EGD) WITH PROPOFOL;  Surgeon: Rachael Fee, MD;  Location: [facility];  Service: Endoscopy;  Laterality: N/A;   IR RADIOLOGIST EVAL & MGMT  05/20/2019   KNEE ARTHROSCOPY Left 07/13/2020   Procedure: left knee arthroscopy, debridement, cyst decompression;   Surgeon: Cammy Copa, MD;  Location: [facility];  Service: Orthopedics;  Laterality: Left;   ORIF MANDIBULAR FRACTURE N/A 02/16/2016   Procedure: OPEN REDUCTION INTERNAL FIXATION (ORIF) MANDIBULAR FRACTURE;  Surgeon: Suzanna Obey, MD;  Location: [facility] OR;  Service: ENT;  Laterality: N/A;   RIB PLATING Left 02/20/2016   Procedure: LEFT RIB PLATING;  Surgeon: Kerin Perna, MD;  Location: [facility] OR;  Service: Thoracic;  Laterality: Left;   ROBOTIC ASSITED PARTIAL NEPHRECTOMY Left 06/17/2013   Procedure: ROBOTIC ASSITED PARTIAL NEPHRECTOMY;  Surgeon: Crecencio Mc, MD;  Location: [facility];  Service: Urology;  Laterality: Left;   TRACHEOSTOMY TUBE PLACEMENT N/A 02/16/2016   Procedure: TRACHEOSTOMY;  Surgeon: Suzanna Obey, MD;  Location: [facility] OR;  Service: ENT;  Laterality: N/A;   VASECTOMY  2012   WISDOM TOOTH EXTRACTION  middle school   WRIST SURGERY Left middle school   "arteries and nerves tangled up"    Social History:  reports that he has been smoking cigarettes. He has a 24 pack-year smoking history. He has never used smokeless tobacco. He reports that he does not currently use alcohol. He reports that he does not use drugs.  Allergies:  Allergies  Allergen Reactions   Bee Venom  Shortness Of Breath and Swelling    Arm swells   Hornet Venom Anaphylaxis, Shortness Of Breath and Swelling    Arm swells    Medications Prior to Admission  Medication Sig Dispense Refill   acetaminophen (TYLENOL) 500 MG tablet Take 1,000 mg by mouth every 6 (six) hours as needed for moderate pain.     meclizine (ANTIVERT) 25 MG tablet Take 25 mg by mouth daily as needed.     SUMAtriptan (IMITREX) 25 MG tablet Take 25 mg by mouth daily as needed.     albuterol (VENTOLIN HFA) 108 (90 Base) MCG/ACT inhaler Inhale 1-2 puffs into the lungs every 6 (six) hours as needed for wheezing or shortness of breath. 18 g 1   ALPRAZolam (XANAX) 0.5 MG tablet Take 0.25 mg by mouth every morning.     ascorbic acid (VITAMIN  C) 1000 MG tablet Take by mouth.     cephALEXin (KEFLEX) 500 MG capsule Take 500 mg by mouth every 8 (eight) hours.     cetirizine (ZYRTEC) 10 MG tablet 2 tablets 2 times per day 120 tablet 5   doxycycline (VIBRAMYCIN) 100 MG capsule Take 100 mg by mouth 2 (two) times daily.     EPINEPHrine 0.3 mg/0.3 mL IJ SOAJ injection Inject 0.3 mg into the muscle as needed for anaphylaxis. As needed for life-threatening allergic reactions 2 each 1   famotidine (PEPCID) 40 MG tablet Take 1 tablet (40 mg total) by mouth 2 (two) times daily. 60 tablet 5   fluticasone (FLONASE) 50 MCG/ACT nasal spray Place 1 spray into both nostrils at bedtime.     gabapentin (NEURONTIN) 300 MG capsule TAKE 3 CAPSULES IN THE MORNING, 3 CAPSULES AT LUNCH, AND 3 CAPSULES ARE BEDTIME 270 capsule 5   guaiFENesin-codeine 100-10 MG/5ML syrup Take 5 mLs by mouth 2 (two) times daily.     hydrOXYzine (ATARAX) 50 MG tablet Take 50 mg by mouth 3 (three) times daily.     lidocaine (LIDODERM) 5 % PLACE 3 PATCHES ONTO THE SKIN DAILY. REMOVE & DISCARD PATCH WITHIN 12 HOURS OR AS DIRECTED BY MD 90 patch 5   metaxalone (SKELAXIN) 800 MG tablet TAKE 1 TABLET (800 MG TOTAL) BY MOUTH 3 (THREE) TIMES DAILY. AS NEEDED 90 tablet 5   metoprolol succinate (TOPROL-XL) 100 MG 24 hr tablet Take 100 mg by mouth daily.     naproxen (EC-NAPROXEN) 500 MG EC tablet TAKE 1 TABLET BY MOUTH 2 TIMES DAILY WITH A MEAL. 60 tablet 5   olmesartan (BENICAR) 40 MG tablet Take 40 mg by mouth daily.     omeprazole (PRILOSEC) 20 MG capsule TAKE 1 CAPSULE BY MOUTH EVERY DAY 90 capsule 1   oxyCODONE (ROXICODONE) 5 MG immediate release tablet Take 1 tablet (5 mg total) by mouth 3 (three) times daily as needed for breakthrough pain. 90 tablet 0   oxyCODONE ER (XTAMPZA ER) 36 MG C12A Take 1 capsule (36 mg total) by mouth every 12 (twelve) hours. For chronic pain 60 capsule 0   phentermine (ADIPEX-P) 37.5 MG tablet Take 18.75 mg by mouth every morning.     predniSONE (STERAPRED  UNI-PAK 48 TAB) 10 MG (48) TBPK tablet Take by mouth as directed.     promethazine (PHENERGAN) 25 MG tablet Take 25 mg by mouth every 6 (six) hours as needed for nausea or vomiting.     QUEtiapine (SEROQUEL XR) 50 MG TB24 24 hr tablet Take 50 mg by mouth.     sertraline (ZOLOFT) 100 MG  tablet Take 100 mg by mouth daily.     tadalafil (CIALIS) 5 MG tablet Take 5 mg by mouth daily as needed for erectile dysfunction.     triamcinolone cream (KENALOG) 0.1 % Apply 1 Application topically 2 (two) times daily.       Physical Exam: Blood pressure 139/60, pulse (!) 116, temperature 99.9 F (37.7 C), temperature source Oral, resp. rate (!) 33, weight (!) 149.1 kg, SpO2 100%. Gen: obese male, in respiratory distress, talking and F/C CV: sinus tachycardia Resp: tachypnea, accessory muscle use  Results for orders placed or performed during the hospital encounter of 05/07/23 (from the past 48 hour(s))  CBC with Differential     Status: Abnormal   Collection Time: 05/07/23  1:22 PM  Result Value Ref Range   WBC 17.3 (H) 4.0 - 10.5 K/uL   RBC 3.78 (L) 4.22 - 5.81 MIL/uL   Hemoglobin 11.6 (L) 13.0 - 17.0 g/dL   HCT 54.0 (L) 98.1 - 19.1 %   MCV 91.5 80.0 - 100.0 fL   MCH 30.7 26.0 - 34.0 pg   MCHC 33.5 30.0 - 36.0 g/dL   RDW 47.8 29.5 - 62.1 %   Platelets 245 150 - 400 K/uL   nRBC 0.0 0.0 - 0.2 %   Neutrophils Relative % 63 %   Neutro Abs 11.0 (H) 1.7 - 7.7 K/uL   Lymphocytes Relative 18 %   Lymphs Abs 3.1 0.7 - 4.0 K/uL   Monocytes Relative 8 %   Monocytes Absolute 1.5 (H) 0.1 - 1.0 K/uL   Eosinophils Relative 9 %   Eosinophils Absolute 1.6 (H) 0.0 - 0.5 K/uL   Basophils Relative 1 %   Basophils Absolute 0.1 0.0 - 0.1 K/uL   Immature Granulocytes 1 %   Abs Immature Granulocytes 0.12 (H) 0.00 - 0.07 K/uL    Comment: Performed at [facility], [address], [place], [place] [address]  Comprehensive metabolic panel     Status: Abnormal   Collection Time: 05/07/23  1:22 PM  Result  Value Ref Range   Sodium 128 (L) 135 - 145 mmol/L   Potassium 4.2 3.5 - 5.1 mmol/L   Chloride 96 (L) 98 - 111 mmol/L   CO2 21 (L) 22 - 32 mmol/L   Glucose, Bld 166 (H) 70 - 99 mg/dL    Comment: Glucose reference range applies only to samples taken after fasting for at least 8 hours.   BUN 35 (H) 6 - 20 mg/dL   Creatinine, Ser 7.84 (H) 0.61 - 1.24 mg/dL   Calcium 8.6 (L) 8.9 - 10.3 mg/dL   Total Protein 6.4 (L) 6.5 - 8.1 g/dL   Albumin 3.7 3.5 - 5.0 g/dL   AST 44 (H) 15 - 41 U/L   ALT 34 0 - 44 U/L   Alkaline Phosphatase 59 38 - 126 U/L   Total Bilirubin 0.5 0.3 - 1.2 mg/dL   GFR, Estimated 39 (L) >60 mL/min    Comment: (NOTE) Calculated using the CKD-EPI Creatinine Equation (2021)    Anion gap 11 5 - 15    Comment: Performed at [facility], [address], [place], [place] [address]  Magnesium     Status: None   Collection Time: 05/07/23  1:22 PM  Result Value Ref Range   Magnesium 2.2 1.7 - 2.4 mg/dL    Comment: Performed at [facility], [address], [place], [place] [address]  I-Stat venous blood gas, [facility], [facility],  [facility])     Status: Abnormal   Collection Time: 05/07/23  1:52 PM  Result Value Ref Range   pH, Ven 7.271 7.25 - 7.43   pCO2, Ven 47.5 44 - 60 mmHg   pO2, Ven 153 (H) 32 - 45 mmHg   Bicarbonate 21.9 20.0 - 28.0 mmol/L   TCO2 23 22 - 32 mmol/L   O2 Saturation 99 %   Acid-base deficit 5.0 (H) 0.0 - 2.0 mmol/L   Sodium 130 (L) 135 - 145 mmol/L   Potassium 4.3 3.5 - 5.1 mmol/L   Calcium, Ion 1.19 1.15 - 1.40 mmol/L   HCT 36.0 (L) 39.0 - 52.0 %   Hemoglobin 12.2 (L) 13.0 - 17.0 g/dL   Sample type VENOUS   Glucose, capillary     Status: Abnormal   Collection Time: 05/07/23  2:36 PM  Result Value Ref Range   Glucose-Capillary 273 (H) 70 - 99 mg/dL    Comment: Glucose reference range applies only to samples taken after fasting for at least 8 hours.   DG Chest Portable 1 View  Result Date: 05/07/2023 CLINICAL DATA:  Wheezing, wasp sting EXAM:  PORTABLE CHEST 1 VIEW COMPARISON:  04/08/2023 FINDINGS: Cardiomegaly. Pulmonary vascular prominence and diffuse bilateral interstitial opacity. Unchanged plate and screw fixation of multiple lateral left ribs. IMPRESSION: Cardiomegaly with pulmonary vascular prominence and diffuse bilateral interstitial opacity, most consistent with edema. No focal airspace opacity. Electronically Signed   By: Jearld Lesch M.D.   On: 05/07/2023 14:31     Assessment/Plan [name] is a 47 y/o M with worsening respiratory status and need for urgent intubation due to angioedema.  Recommend proceeding to the OR for attempted intubation by anesthesia, tracheostomy if unable to place ETT.   HIGH MDM  I reviewed nursing notes, hospitalist notes, last 24 h vitals and pain scores, last 48 h intake and output, last 24 h labs and trends, and last 24 h imaging results.  Adam Phenix, [facility] Surgery 05/07/2023, 2:47 PM Please see Amion for pager number during day hours 7:00am-4:30pm or 7:00am -11:30am on weekends

## 2023-05-07 NOTE — Op Note (Signed)
   Operative Note   Date: 05/07/2023  Procedure: intubation by anesthesia  Pre-op diagnosis: angioedema, impending airway compromise Post-op diagnosis: same  Indication and clinical history: The patient is a 47 y.o. 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      Surgeon: Diamantina Monks, MD  Anesthesiologist: Nance Pew, MD Anesthesia: General  Disposition: ICU - intubated and hemodynamically stable.  Description of procedure: The patient was positioned supine on the operating room table. General anesthetic induction and orotracheal intubation with Glidescope by anesthesia was successful with one attempt and size 7 tube. Airway did not look significantly edematous on visual inspection. ENT, Dr. Marene Lenz, available intra-op for assistance.    Diamantina Monks, MD General and Trauma Surgery [facility] Surgery

## 2023-05-07 NOTE — Anesthesia Procedure Notes (Addendum)
Procedure Name: Intubation Date/Time: 05/07/2023 3:15 PM  Performed by: Quentin Ore, CRNAPre-anesthesia Checklist: Patient identified, Emergency Drugs available, Suction available and Patient being monitored Patient Re-evaluated:Patient Re-evaluated prior to induction Oxygen Delivery Method: Circle system utilized Preoxygenation: Pre-oxygenation with 100% oxygen Induction Type: IV induction and Rapid sequence Laryngoscope Size: Glidescope and 4 Tube type: Oral Tube size: 7.0 mm Number of attempts: 1 Airway Equipment and Method: Stylet and Video-laryngoscopy Placement Confirmation: ETT inserted through vocal cords under direct vision, positive ETCO2 and breath sounds checked- equal and bilateral Secured at: 23 cm Tube secured with: Tape Dental Injury: Teeth and Oropharynx as per pre-operative assessment

## 2023-05-07 NOTE — Progress Notes (Signed)
Md made aware of pt recent fall @ home per family.

## 2023-05-07 NOTE — Transfer of Care (Signed)
Immediate Anesthesia Transfer of Care Note  Patient: [name]  Procedure(s) Performed: INTUBATION-ENDOTRACHEAL WITH TRACHEOSTOMY STANDBY (Mouth)  Patient Location: ICU  Anesthesia Type:General  Level of Consciousness: sedated and Patient remains intubated per anesthesia plan  Airway & Oxygen Therapy: Patient placed on Ventilator (see vital sign flow sheet for setting)  Post-op Assessment: Report given to RN and Post -op Vital signs reviewed and stable  Post vital signs: Reviewed and stable  Last Vitals:  Vitals Value Taken Time  BP 143/69 05/07/23 1600  Temp    Pulse 107 05/07/23 1601  Resp 18 05/07/23 1601  SpO2 94 % 05/07/23 1601  Vitals shown include unfiled device data.  Last Pain:  Vitals:   05/07/23 1327  TempSrc: Oral  PainSc:          Complications: No notable events documented.

## 2023-05-07 NOTE — Consult Note (Signed)
Called by Dr. Bedelia Person due to concern for impending airway compromise in setting of obesity, history of previous trach, and angioedema. Arrived in OR with plans to proceed with tracheostomy if intubation was unsuccessful. Fortunately, patient was orotracheally intubated on first attempt by anesthesia. Larynx appeared normal, with normal anatomic landmarks and mild edema on glidescope. Following intubation, tongue was examined and noted to be normal in size and appearance. Patient admitted to [facility] for medical management. ENT available if needed.   Thank you for allowing me to participate in the care of this patient. Please do not hesitate to contact me with any questions or concerns.   Laren Boom, DO Otolaryngology [facility] ENT Cell: [phone]

## 2023-05-07 NOTE — Consult Note (Signed)
Seen and examined. Angioedema with airway compromise on bipap with impending respiratory failure. Obese, large neck, and prior history of trach. Small window of opportunity to go to OR and attempt awake fiberoptic bronch. If unsuccessful, will plan for trach.   Diamantina Monks, MD General and Trauma Surgery [facility] Surgery
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
## 2023-05-07 NOTE — ED Triage Notes (Signed)
Pt to ED via [facility] from home. Pt called EMS d/t wasp sting in neck. Pt went into anaphylaxis with stridor. Pt has been give 3 IM epi, 50mg  benadryl, 1mg  epi neb, 3 duo nebs, 4mg  zofran IV, 125 solu medrol by EMS en route. Pt was hypotensive with EMS at 70 palp. Pt's BP came up to 96/54. Pt continues to have stridor upon arrival to ED. Provider at bedside.    EMS Vitals: 96/54 94% NRB 112 HR

## 2023-05-07 NOTE — ED Provider Notes (Signed)
[place] EMERGENCY DEPARTMENT AT [facility] Provider Note   CSN: [id number] Arrival date & time: 05/07/23  1314     History  Chief Complaint  Patient presents with   Allergic Reaction   Hypotension    [name] is a 47 y.o. male.   Allergic Reaction Patient presents for allergic reaction.  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  Shortly prior to arrival, patient was stung by wasp on the neck.  He had urticaria, throat swelling, chest tightness.  EMS noted stridorous breath sounds, hypotension.  He was given 3 intramuscular shots of epinephrine, 50 mg of Benadryl, 125 mg Solu-Medrol, 4 mg of Zofran, 3 DuoNebs, and 1 nebulized epinephrine.  Patient endorses continued fatigue and lightheadedness.  He endorses continued sensation of throat swelling and shortness of breath.     Home Medications Prior to Admission medications   Medication Sig Start Date End Date Taking? Authorizing Provider  acetaminophen (TYLENOL) 500 MG tablet Take 1,000 mg by mouth every 6 (six) hours as needed for moderate pain.   Yes [provider]  meclizine (ANTIVERT) 25 MG tablet Take 25 mg by mouth daily as needed. 04/25/23  Yes [provider]  SUMAtriptan (IMITREX) 25 MG tablet Take 25 mg by mouth daily as needed. 04/28/23  Yes [provider]  albuterol (VENTOLIN HFA) 108 (90 Base) MCG/ACT inhaler Inhale 1-2 puffs into the lungs every 6 (six) hours as needed for wheezing or shortness of breath. 04/10/23   Kozlow, Alvira Philips, MD  ALPRAZolam Prudy Feeler) 0.5 MG tablet Take 0.25 mg by mouth every morning. 04/19/22   [provider]  ascorbic acid (VITAMIN C) 1000 MG tablet Take by mouth. 04/20/20   [provider]  cephALEXin (KEFLEX) 500 MG capsule Take 500 mg by mouth every 8 (eight) hours. 04/06/23   [provider]  cetirizine (ZYRTEC) 10 MG tablet 2 tablets 2 times per day 04/10/23   Jessica Priest, MD  doxycycline  (VIBRAMYCIN) 100 MG capsule Take 100 mg by mouth 2 (two) times daily. 04/04/23   [provider]  EPINEPHrine 0.3 mg/0.3 mL IJ SOAJ injection Inject 0.3 mg into the muscle as needed for anaphylaxis. As needed for life-threatening allergic reactions 04/10/23   Kozlow, Alvira Philips, MD  famotidine (PEPCID) 40 MG tablet Take 1 tablet (40 mg total) by mouth 2 (two) times daily. 04/10/23   Kozlow, Alvira Philips, MD  fluticasone (FLONASE) 50 MCG/ACT nasal spray Place 1 spray into both nostrils at bedtime. 05/23/20   [provider]  gabapentin (NEURONTIN) 300 MG capsule TAKE 3 CAPSULES IN THE MORNING, 3 CAPSULES AT LUNCH, AND 3 CAPSULES ARE BEDTIME 02/05/23   Lovorn, Aundra Millet, MD  guaiFENesin-codeine 100-10 MG/5ML syrup Take 5 mLs by mouth 2 (two) times daily. 07/12/22   [provider]  hydrOXYzine (ATARAX) 50 MG tablet Take 50 mg by mouth 3 (three) times daily. 09/06/22   [provider]  lidocaine (LIDODERM) 5 % PLACE 3 PATCHES ONTO THE SKIN DAILY. REMOVE & DISCARD PATCH WITHIN 12 HOURS OR AS DIRECTED BY MD 12/16/22   Erick Colace, MD  metaxalone (SKELAXIN) 800 MG tablet TAKE 1 TABLET (800 MG TOTAL) BY MOUTH 3 (THREE) TIMES DAILY. AS NEEDED 01/20/23   Lovorn, Aundra Millet, MD  metoprolol succinate (TOPROL-XL) 100 MG 24 hr tablet Take 100 mg by mouth daily. 03/07/21   [provider]  naproxen (EC-NAPROXEN) 500 MG EC tablet TAKE 1 TABLET BY MOUTH 2 TIMES  DAILY WITH A MEAL. 01/20/23   Lovorn, Aundra Millet, MD  olmesartan (BENICAR) 40 MG tablet Take 40 mg by mouth daily.    [provider]  omeprazole (PRILOSEC) 20 MG capsule TAKE 1 CAPSULE BY MOUTH EVERY DAY 02/05/23   Lovorn, Aundra Millet, MD  oxyCODONE (ROXICODONE) 5 MG immediate release tablet Take 1 tablet (5 mg total) by mouth 3 (three) times daily as needed for breakthrough pain. 04/17/23   Jones Bales, NP  oxyCODONE ER [facility] ER) 36 MG C12A Take 1 capsule (36 mg total) by mouth every 12 (twelve) hours. For chronic pain 04/17/23    Jones Bales, NP  phentermine (ADIPEX-P) 37.5 MG tablet Take 18.75 mg by mouth every morning. 08/19/22   [provider]  predniSONE (STERAPRED UNI-PAK 48 TAB) 10 MG (48) TBPK tablet Take by mouth as directed. 04/06/23   [provider]  promethazine (PHENERGAN) 25 MG tablet Take 25 mg by mouth every 6 (six) hours as needed for nausea or vomiting.    [provider]  QUEtiapine (SEROQUEL XR) 50 MG TB24 24 hr tablet Take 50 mg by mouth. 04/20/20   [provider]  sertraline (ZOLOFT) 100 MG tablet Take 100 mg by mouth daily. 06/25/22   [provider]  tadalafil (CIALIS) 5 MG tablet Take 5 mg by mouth daily as needed for erectile dysfunction.    [provider]  triamcinolone cream (KENALOG) 0.1 % Apply 1 Application topically 2 (two) times daily. 04/04/23   [provider]      Allergies    Bee venom and Hornet venom    Review of Systems   Review of Systems  Constitutional:  Positive for fatigue.  Respiratory:  Positive for chest tightness, shortness of breath and stridor.   Gastrointestinal:  Positive for nausea.  Neurological:  Positive for dizziness, weakness and light-headedness.    Physical Exam Updated Vital Signs BP (!) 143/69   Pulse (!) 107   Temp 99.9 F (37.7 C) (Oral)   Resp 18   Ht 6\' 2"  (1.88 m)   Wt (!) 149.1 kg Comment: From office note 03/2023  SpO2 94%   BMI 42.20 kg/m  Physical Exam Vitals and nursing note reviewed.  Constitutional:      General: He is not in acute distress.    Appearance: He is well-developed. He is ill-appearing. He is not toxic-appearing or diaphoretic.  HENT:     Head: Normocephalic and atraumatic.     Right Ear: External ear normal.     Left Ear: External ear normal.     Nose: Nose normal.     Mouth/Throat:     Mouth: Mucous membranes are moist.  Eyes:     Extraocular Movements: Extraocular movements intact.     Conjunctiva/sclera: Conjunctivae normal.  Cardiovascular:      Rate and Rhythm: Regular rhythm. Tachycardia present.     Heart sounds: No murmur heard. Pulmonary:     Effort: Pulmonary effort is normal. No respiratory distress.     Breath sounds: Wheezing present.  Chest:     Chest wall: No tenderness.  Abdominal:     General: There is no distension.     Palpations: Abdomen is soft.     Tenderness: There is no abdominal tenderness.  Musculoskeletal:        General: No swelling. Normal range of motion.     Cervical back: Normal range of motion and neck supple.     Right lower leg: No edema.  Left lower leg: No edema.  Skin:    General: Skin is warm and dry.     Coloration: Skin is pale. Skin is not jaundiced.  Neurological:     General: No focal deficit present.     Mental Status: He is alert and oriented to person, place, and time.  Psychiatric:        Mood and Affect: Mood normal.        Behavior: Behavior normal.     ED Results / Procedures / Treatments   Labs (all labs ordered are listed, but only abnormal results are displayed) Labs Reviewed  CBC WITH DIFFERENTIAL/PLATELET - Abnormal; Notable for the following components:      Result Value   WBC 17.3 (*)    RBC 3.78 (*)    Hemoglobin 11.6 (*)    HCT 34.6 (*)    Neutro Abs 11.0 (*)    Monocytes Absolute 1.5 (*)    Eosinophils Absolute 1.6 (*)    Abs Immature Granulocytes 0.12 (*)    All other components within normal limits  COMPREHENSIVE METABOLIC PANEL - Abnormal; Notable for the following components:   Sodium 128 (*)    Chloride 96 (*)    CO2 21 (*)    Glucose, Bld 166 (*)    BUN 35 (*)    Creatinine, Ser 2.04 (*)    Calcium 8.6 (*)    Total Protein 6.4 (*)    AST 44 (*)    GFR, Estimated 39 (*)    All other components within normal limits  GLUCOSE, CAPILLARY - Abnormal; Notable for the following components:   Glucose-Capillary 273 (*)    All other components within normal limits  GLUCOSE, CAPILLARY - Abnormal; Notable for the following components:    Glucose-Capillary 417 (*)    All other components within normal limits  GLUCOSE, CAPILLARY - Abnormal; Notable for the following components:   Glucose-Capillary 422 (*)    All other components within normal limits  I-STAT VENOUS BLOOD GAS, ED - Abnormal; Notable for the following components:   pO2, Ven 153 (*)    Acid-base deficit 5.0 (*)    Sodium 130 (*)    HCT 36.0 (*)    Hemoglobin 12.2 (*)    All other components within normal limits  POCT I-STAT 7, (LYTES, BLD GAS, ICA,H+H) - Abnormal; Notable for the following components:   pH, Arterial 7.141 (*)    pCO2 arterial 64.0 (*)    Acid-base deficit 8.0 (*)    Sodium 132 (*)    HCT 38.0 (*)    Hemoglobin 12.9 (*)    All other components within normal limits  MRSA NEXT GEN BY PCR, NASAL  MAGNESIUM  HIV ANTIBODY (ROUTINE TESTING W REFLEX)  HEMOGLOBIN A1C  BLOOD GAS, ARTERIAL  CBC  BASIC METABOLIC PANEL  MAGNESIUM  PHOSPHORUS  TRIGLYCERIDES    EKG EKG Interpretation Date/Time:  Wednesday May 07 2023 13:21:49 EDT Ventricular Rate:  112 PR Interval:  164 QRS Duration:  103 QT Interval:  322 QTC Calculation: 440 R Axis:   95  Text Interpretation: Sinus tachycardia Low voltage with right axis deviation Confirmed by Gloris Manchester [phone]) on 05/07/2023 5:26:36 PM  Radiology DG Chest Portable 1 View  Result Date: 05/07/2023 CLINICAL DATA:  Wheezing, wasp sting EXAM: PORTABLE CHEST 1 VIEW COMPARISON:  04/08/2023 FINDINGS: Cardiomegaly. Pulmonary vascular prominence and diffuse bilateral interstitial opacity. Unchanged plate and screw fixation of multiple lateral left ribs. IMPRESSION: Cardiomegaly with pulmonary vascular prominence and  diffuse bilateral interstitial opacity, most consistent with edema. No focal airspace opacity. Electronically Signed   By: Jearld Lesch M.D.   On: 05/07/2023 14:31    Procedures Procedures    Medications Ordered in ED Medications  EPINEPHrine (ADRENALIN) 5 mg in NS 250 mL (0.02 mg/mL) premix  infusion ( Intravenous MAR Unhold 05/07/23 1550)  docusate sodium (COLACE) capsule 100 mg ( Oral MAR Unhold 05/07/23 1550)  polyethylene glycol (MIRALAX / GLYCOLAX) packet 17 g ( Oral MAR Unhold 05/07/23 1550)  ipratropium-albuterol (DUONEB) 0.5-2.5 (3) MG/3ML nebulizer solution 3 mL ( Nebulization MAR Unhold 05/07/23 1550)  enoxaparin (LOVENOX) injection 40 mg ( Subcutaneous MAR Unhold 05/07/23 1550)  dexmedetomidine (PRECEDEX) 400 MCG/100ML (4 mcg/mL) infusion ( Intravenous MAR Unhold 05/07/23 1550)  fentaNYL (SUBLIMAZE) 50 MCG/ML injection (  Not Given 05/07/23 1635)  methylPREDNISolone sodium succinate (SOLU-MEDROL) 125 mg/2 mL injection 125 mg ( Intravenous MAR Unhold 05/07/23 1550)  famotidine (PEPCID) IVPB 20 mg premix ( Intravenous MAR Unhold 05/07/23 1550)  diphenhydrAMINE (BENADRYL) injection 50 mg ( Intravenous MAR Unhold 05/07/23 1550)  lidocaine 2% "Nebulized" 5 mL (has no administration in time range)  propofol (DIPRIVAN) 1000 MG/100ML infusion (70 mcg/kg/min  149.1 kg Intravenous New Bag/Given 05/07/23 1656)  insulin regular, human (MYXREDLIN) 100 units/ 100 mL infusion (7.5 Units/hr Intravenous New Bag/Given 05/07/23 1655)  lactated ringers infusion (has no administration in time range)  dextrose 5 % in lactated ringers infusion (has no administration in time range)  dextrose 50 % solution 0-50 mL (has no administration in time range)  lactated ringers bolus 1,000 mL (1,000 mLs Intravenous New Bag/Given 05/07/23 1337)  EPINEPHrine (EPI-PEN) injection 0.3 mg (0.3 mg Intramuscular Given 05/07/23 1322)  famotidine (PEPCID) IVPB 20 mg premix (0 mg Intravenous Stopped 05/07/23 1357)  albuterol (PROVENTIL) (2.5 MG/3ML) 0.083% nebulizer solution 2.5 mg (2.5 mg Nebulization Given 05/07/23 1328)  fentaNYL (SUBLIMAZE) injection 50 mcg (50 mcg Intravenous Given 05/07/23 1338)    ED Course/ Medical Decision Making/ A&P                             Medical Decision Making Amount and/or Complexity of  Data Reviewed Labs: ordered. Radiology: ordered.  Risk Prescription drug management. Decision regarding hospitalization.   This patient presents to the ED for concern of allergic reaction, this involves an extensive number of treatment options, and is a complaint that carries with it a high risk of complications and morbidity.  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  The pertinent results include: AKI is present with mild hyponatremia, hypochloremia, and hypocalcemia.  Leukocytosis is present.  CBC shows a 4 g/dL drop in hemoglobin when compared to lab work from 3 weeks ago.  Imaging Studies ordered:  I ordered imaging studies including chest x-ray I independently visualized and interpreted imaging which showed evidence of mild pulmonary edema. I agree with the radiologist interpretation   Cardiac Monitoring: / EKG:  The patient was maintained on a cardiac monitor.  I personally viewed and interpreted the cardiac monitored which showed an underlying rhythm of: Sinus rhythm   Consultations Obtained:  I requested consultation with the intensivist,  and  discussed lab and imaging findings as well as pertinent plan - they recommend: Admission to ICU   Problem List / ED Course / Critical interventions / Medication management  Patient presenting for an anaphylactic reaction to a insect sting.  He has a history of the same.  He was given 3 intramuscular shots of epinephrine, 125 mg of Solu-Medrol, 50 mg of Benadryl, 3 DuoNebs, and 1 nebulized epinephrine prior to arrival.  On arrival, he is awake and alert.   He has wheezing on lung auscultation.  He endorses continued sensation of throat swelling.  I do not appreciate any tongue swelling.  Last IM shot of epi was given 10 minutes prior to arrival.  Additional IM shot of epi ordered in the ED.  Patient to be started on epinephrine gtt.  IV fluids and Pepcid were ordered.  Patient was given additional nebulized breathing treatment for ongoing wheezing.  Lab work was ordered.  Patient's blood pressures improved on epi gtt.  They remained soft and rate was increased to 15 mcg/min.  He would have intermittent somnolence.  BiPAP was initiated.  Patient was admitted to ICU for further management. I ordered medication including IV fluids for hydration; epinephrine for anaphylactic shock; albuterol for wheezing; fentanyl for analgesia Reevaluation of the patient after these medicines showed that the patient improved I have reviewed the patients home medicines and have made adjustments as needed   Social Determinants of Health:  Has PCP  CRITICAL CARE Performed by: Gloris Manchester   Total critical care time: 35 minutes  Critical care time was exclusive of separately billable procedures and treating other patients.  Critical care was necessary to treat or prevent imminent or life-threatening deterioration.  Critical care was time spent personally by me on the following activities: development of treatment plan with patient and/or surrogate as well as nursing, discussions with consultants, evaluation of patient's response to treatment, examination of patient, obtaining history from patient or surrogate, ordering and performing treatments and interventions, ordering and review of laboratory studies, ordering and review of radiographic studies, pulse oximetry and re-evaluation of patient's condition.        Final Clinical Impression(s) / ED Diagnoses Final diagnoses:  Anaphylactic shock    Rx / DC Orders ED Discharge Orders     None         Gloris Manchester, MD 05/07/23 1729

## 2023-05-07 NOTE — Progress Notes (Signed)
Pt transported from ED room 6 to 2M07 on BiPAP without any complications.

## 2023-05-08 ENCOUNTER — Inpatient Hospital Stay (HOSPITAL_COMMUNITY): Payer: Medicaid Other

## 2023-05-08 DIAGNOSIS — J9601 Acute respiratory failure with hypoxia: Secondary | ICD-10-CM

## 2023-05-08 LAB — BASIC METABOLIC PANEL
Anion gap: 6 (ref 5–15)
BUN: 25 mg/dL — ABNORMAL HIGH (ref 6–20)
CO2: 22 mmol/L (ref 22–32)
Calcium: 8.8 mg/dL — ABNORMAL LOW (ref 8.9–10.3)
Chloride: 106 mmol/L (ref 98–111)
Creatinine, Ser: 1.24 mg/dL (ref 0.61–1.24)
GFR, Estimated: >60 mL/min (ref >60)
Glucose, Bld: 131 mg/dL — ABNORMAL HIGH (ref 70–99)
Potassium: 4.9 mmol/L (ref 3.5–5.1)
Sodium: 134 mmol/L — ABNORMAL LOW (ref 135–145)

## 2023-05-08 LAB — CBC
HCT: 34 % — ABNORMAL LOW (ref 39.0–52.0)
Hemoglobin: 11.4 g/dL — ABNORMAL LOW (ref 13.0–17.0)
MCH: 30.1 pg (ref 26.0–34.0)
MCHC: 33.5 g/dL (ref 30.0–36.0)
MCV: 89.7 fL (ref 80.0–100.0)
Platelets: 206 10*3/uL (ref 150–400)
RBC: 3.79 MIL/uL — ABNORMAL LOW (ref 4.22–5.81)
RDW: 14.6 % (ref 11.5–15.5)
WBC: 14.3 10*3/uL — ABNORMAL HIGH (ref 4.0–10.5)
nRBC: 0 % (ref 0.0–0.2)

## 2023-05-08 LAB — GLUCOSE, CAPILLARY
Glucose-Capillary: 104 mg/dL — ABNORMAL HIGH (ref 70–99)
Glucose-Capillary: 108 mg/dL — ABNORMAL HIGH (ref 70–99)
Glucose-Capillary: 127 mg/dL — ABNORMAL HIGH (ref 70–99)
Glucose-Capillary: 130 mg/dL — ABNORMAL HIGH (ref 70–99)
Glucose-Capillary: 133 mg/dL — ABNORMAL HIGH (ref 70–99)
Glucose-Capillary: 140 mg/dL — ABNORMAL HIGH (ref 70–99)
Glucose-Capillary: 142 mg/dL — ABNORMAL HIGH (ref 70–99)
Glucose-Capillary: 145 mg/dL — ABNORMAL HIGH (ref 70–99)
Glucose-Capillary: 146 mg/dL — ABNORMAL HIGH (ref 70–99)
Glucose-Capillary: 151 mg/dL — ABNORMAL HIGH (ref 70–99)
Glucose-Capillary: 163 mg/dL — ABNORMAL HIGH (ref 70–99)
Glucose-Capillary: 172 mg/dL — ABNORMAL HIGH (ref 70–99)
Glucose-Capillary: 188 mg/dL — ABNORMAL HIGH (ref 70–99)
Glucose-Capillary: 200 mg/dL — ABNORMAL HIGH (ref 70–99)

## 2023-05-08 LAB — PHOSPHORUS: Phosphorus: 2.1 mg/dL — ABNORMAL LOW (ref 2.5–4.6)

## 2023-05-08 LAB — TRIGLYCERIDES: Triglycerides: 92 mg/dL (ref <150)

## 2023-05-08 LAB — MAGNESIUM: Magnesium: 2.3 mg/dL (ref 1.7–2.4)

## 2023-05-08 LAB — HIV ANTIBODY (ROUTINE TESTING W REFLEX): HIV Screen 4th Generation wRfx: NONREACTIVE

## 2023-05-08 MED ORDER — METAXALONE 800 MG PO TABS
800.0000 mg | ORAL_TABLET | Freq: Three times a day (TID) | ORAL | Status: DC | PRN
  Administered 2023-05-09 – 2023-05-11 (×4): 800 mg via ORAL
  Filled 2023-05-08 (×7): qty 1

## 2023-05-08 MED ORDER — SODIUM PHOSPHATES 45 MMOLE/15ML IV SOLN
25.0000 mmol | Freq: Once | INTRAVENOUS | Status: AC
  Administered 2023-05-08: 25 mmol via INTRAVENOUS
  Filled 2023-05-08: qty 8.33

## 2023-05-08 MED ORDER — VITAL AF 1.2 CAL PO LIQD: 1000.0000 mL | ORAL | Status: DC

## 2023-05-08 MED ORDER — QUETIAPINE FUMARATE 50 MG PO TABS: 50.0000 mg | ORAL_TABLET | Freq: Every day | ORAL | Status: DC

## 2023-05-08 MED ORDER — NICOTINE 21 MG/24HR TD PT24
21.0000 mg | MEDICATED_PATCH | Freq: Every day | TRANSDERMAL | Status: DC
  Administered 2023-05-08 – 2023-05-11 (×4): 21 mg via TRANSDERMAL
  Filled 2023-05-08 (×4): qty 1

## 2023-05-08 MED ORDER — GABAPENTIN 300 MG PO CAPS
300.0000 mg | ORAL_CAPSULE | Freq: Three times a day (TID) | ORAL | Status: DC
  Administered 2023-05-08: 300 mg via ORAL
  Filled 2023-05-08: qty 1

## 2023-05-08 MED ORDER — PROSOURCE TF20 ENFIT COMPATIBL EN LIQD: 60.0000 mL | Freq: Every day | ENTERAL | Status: DC

## 2023-05-08 MED ORDER — OXYCODONE HCL 5 MG PO TABS
5.0000 mg | ORAL_TABLET | Freq: Three times a day (TID) | ORAL | Status: DC | PRN
  Administered 2023-05-08 – 2023-05-09 (×4): 5 mg
  Filled 2023-05-08 (×4): qty 1

## 2023-05-08 MED ORDER — LACTATED RINGERS IV SOLN: INTRAVENOUS | Status: DC

## 2023-05-08 MED ORDER — ALPRAZOLAM 0.5 MG PO TABS
0.2500 mg | ORAL_TABLET | Freq: Every day | ORAL | Status: DC
  Administered 2023-05-09 – 2023-05-10 (×2): 0.25 mg via ORAL
  Filled 2023-05-08 (×2): qty 1

## 2023-05-08 MED ORDER — SERTRALINE HCL 50 MG PO TABS
100.0000 mg | ORAL_TABLET | Freq: Every day | ORAL | Status: DC
  Administered 2023-05-08: 100 mg
  Filled 2023-05-08: qty 2

## 2023-05-08 MED ORDER — INSULIN ASPART 100 UNIT/ML IJ SOLN: 0.0000 [IU] | INTRAMUSCULAR | Status: DC

## 2023-05-08 MED ORDER — ENOXAPARIN SODIUM 80 MG/0.8ML IJ SOSY
70.0000 mg | PREFILLED_SYRINGE | Freq: Every day | INTRAMUSCULAR | Status: DC
  Administered 2023-05-08 – 2023-05-11 (×4): 70 mg via SUBCUTANEOUS
  Filled 2023-05-08 (×2): qty 0.8
  Filled 2023-05-08 (×2): qty 0.7

## 2023-05-08 MED ORDER — ALPRAZOLAM 0.5 MG PO TABS
0.2500 mg | ORAL_TABLET | Freq: Every day | ORAL | Status: DC
  Administered 2023-05-08: 0.25 mg
  Filled 2023-05-08: qty 1

## 2023-05-08 MED ORDER — CHLORHEXIDINE GLUCONATE CLOTH 2 % EX PADS
6.0000 | MEDICATED_PAD | Freq: Every day | CUTANEOUS | Status: DC
  Administered 2023-05-08 – 2023-05-11 (×4): 6 via TOPICAL

## 2023-05-08 MED ORDER — OXYCODONE HCL ER 15 MG PO T12A
30.0000 mg | EXTENDED_RELEASE_TABLET | Freq: Two times a day (BID) | ORAL | Status: DC
  Administered 2023-05-08 – 2023-05-09 (×2): 30 mg via ORAL
  Filled 2023-05-08 (×2): qty 2

## 2023-05-08 MED ORDER — GABAPENTIN 300 MG PO CAPS
900.0000 mg | ORAL_CAPSULE | Freq: Three times a day (TID) | ORAL | Status: DC
  Administered 2023-05-08 – 2023-05-11 (×9): 900 mg via ORAL
  Filled 2023-05-08 (×9): qty 3

## 2023-05-08 MED ORDER — SERTRALINE HCL 100 MG PO TABS
100.0000 mg | ORAL_TABLET | Freq: Every day | ORAL | Status: DC
  Administered 2023-05-09 – 2023-05-11 (×3): 100 mg via ORAL
  Filled 2023-05-08: qty 2
  Filled 2023-05-08 (×2): qty 1

## 2023-05-08 MED ORDER — QUETIAPINE FUMARATE ER 50 MG PO TB24
50.0000 mg | ORAL_TABLET | Freq: Every day | ORAL | Status: DC
  Administered 2023-05-08 – 2023-05-10 (×3): 50 mg via ORAL
  Filled 2023-05-08 (×4): qty 1

## 2023-05-08 MED ORDER — ALPRAZOLAM 0.5 MG PO TABS
0.2500 mg | ORAL_TABLET | Freq: Two times a day (BID) | ORAL | Status: DC | PRN
  Administered 2023-05-08 – 2023-05-10 (×4): 0.25 mg via ORAL
  Filled 2023-05-08 (×4): qty 1

## 2023-05-08 NOTE — Progress Notes (Signed)
Brief Nutrition Note  Consult received for enteral/tube feeding initiation and management.  Adult Enteral Nutrition Protocol initiated. Full assessment to follow.  OG tube in place with tip located in the fundus of the stomach per xray imaging.   Initiate the following Vital AF 1.2 at 40 ml/h (480 ml per day) Prosource TF20 60 ml 1x/d Provides 576 kcal, 36 gm protein, 389 ml free water daily   Admitting Dx: Anaphylactic shock [T78.2XXA]   Labs: Recent Labs  Lab 05/07/23 1322 05/07/23 1352 05/07/23 1613 05/08/23 0701  NA 128* 130* 132* 134*  K 4.2 4.3 3.8 4.9  CL 96*  --   --  106  CO2 21*  --   --  22  BUN 35*  --   --  25*  CREATININE 2.04*  --   --  1.24  CALCIUM 8.6*  --   --  8.8*  MG 2.2  --   --  2.3  PHOS  --   --   --  2.1*  GLUCOSE 166*  --   --  131*    Greig Castilla, RD, LDN Clinical Dietitian RD pager # available in [facility]  After hours/weekend pager # available in [facility]
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
## 2023-05-08 NOTE — TOC CM/SW Note (Signed)
Patient admitted due to Anaphylactic shock from hymenoptera sting with respiratory distress.  Currently intubated and on precedex and insulin drip. TOC following.

## 2023-05-08 NOTE — Inpatient Diabetes Management (Signed)
Inpatient Diabetes Program Recommendations  AACE/ADA: New Consensus Statement on Inpatient Glycemic Control (2015)  Target Ranges:  Prepandial:   less than 140 mg/dL      Peak postprandial:   less than 180 mg/dL (1-2 hours)      Critically ill patients:  140 - 180 mg/dL   Lab Results  Component Value Date   GLUCAP 146 (H) 05/08/2023   HGBA1C 5.4 03/07/2015    Review of Glycemic Control  Diabetes history: none noted Outpatient Diabetes medications: none Current orders for Inpatient glycemic control: IV insulin drip  Inpatient Diabetes Program Recommendations:   Noted that patient is not currently taking insulin or diabetes meds at home. Noted that patient has been on IV insulin drip with drip rates most recently at 3 units/hour. On steroids.   Recommend at transition time off the IV insulin drip, start Levemir 20 units BID (weight based 148.4 kg X 0.3 units/kg=44.52 units), and Novolog 0-15 units correction scale every 4 hours. Titrate dosages as needed.   Will continue to monitor blood sugars while in the hospital.  Smith Mince RN BSN CDE Diabetes Coordinator Pager: [phone]  8am-5pm

## 2023-05-08 NOTE — Progress Notes (Signed)
RT was called to assess patient who self extubated, upon arrival RT noticed the ET tube was laying next to the patient. Patient was placed on 5L [place] with no distress noted and stable vitals. Patient is alert, oriented and able to speak with no stridor heard.

## 2023-05-08 NOTE — Progress Notes (Signed)
An USGPIV (ultrasound guided PIV) has been placed for short-term vasopressor infusion. A correctly placed ivWatch must be used when administering Vasopressors. Should this treatment be needed beyond 72 hours, central line access should be obtained.  It will be the responsibility of the bedside nurse to follow best practice to prevent extravasations.   

## 2023-05-08 NOTE — Plan of Care (Signed)

## 2023-05-08 NOTE — Progress Notes (Signed)
Called to see patient  Self-extubated  Awake alert interactive Voice is strong  Not wheezy  Appears stable  Can have a bedside swallow evaluation performed and if he passes can have pills orally and can have a diet

## 2023-05-08 NOTE — Anesthesia Postprocedure Evaluation (Signed)
Anesthesia Post Note  Patient: [name]  Procedure(s) Performed: INTUBATION-ENDOTRACHEAL WITH TRACHEOSTOMY STANDBY (Mouth)     Patient location during evaluation: SICU Anesthesia Type: General Level of consciousness: sedated Pain management: pain level controlled Vital Signs Assessment: post-procedure vital signs reviewed and stable Respiratory status: patient remains intubated per anesthesia plan Cardiovascular status: blood pressure returned to baseline and stable Anesthetic complications: no   No notable events documented.  Last Vitals:  Vitals:   05/08/23 0726 05/08/23 0735  BP:  119/67  Pulse: 73 69  Resp: (!) 22 (!) 26  Temp: 36.7 C   SpO2: 95% 94%    Last Pain:  Vitals:   05/08/23 0726  TempSrc: Axillary  PainSc:                  Nelle Don Valerian Jewel

## 2023-05-08 NOTE — Progress Notes (Signed)
Pt self extubated... placed on 4L [place] and O2 Sat 92%... Pt is talking and requesting water.   Notified RT and MD.... MD at bedside.

## 2023-05-09 ENCOUNTER — Ambulatory Visit: Payer: Medicaid Other | Admitting: Physical Medicine and Rehabilitation

## 2023-05-09 ENCOUNTER — Encounter (HOSPITAL_COMMUNITY): Payer: Self-pay | Admitting: Surgery

## 2023-05-09 DIAGNOSIS — T782XXA Anaphylactic shock, unspecified, initial encounter: Secondary | ICD-10-CM | POA: Diagnosis not present

## 2023-05-09 LAB — TROPONIN I (HIGH SENSITIVITY)
Troponin I (High Sensitivity): 11 ng/L (ref <18)
Troponin I (High Sensitivity): 11 ng/L (ref <18)

## 2023-05-09 LAB — BASIC METABOLIC PANEL
Anion gap: 7 (ref 5–15)
BUN: 26 mg/dL — ABNORMAL HIGH (ref 6–20)
CO2: 23 mmol/L (ref 22–32)
Calcium: 8.5 mg/dL — ABNORMAL LOW (ref 8.9–10.3)
Chloride: 106 mmol/L (ref 98–111)
Creatinine, Ser: 1.01 mg/dL (ref 0.61–1.24)
GFR, Estimated: >60 mL/min (ref >60)
Glucose, Bld: 146 mg/dL — ABNORMAL HIGH (ref 70–99)
Potassium: 4.8 mmol/L (ref 3.5–5.1)
Sodium: 136 mmol/L (ref 135–145)

## 2023-05-09 LAB — PHOSPHORUS: Phosphorus: 3.6 mg/dL (ref 2.5–4.6)

## 2023-05-09 LAB — GLUCOSE, CAPILLARY
Glucose-Capillary: 147 mg/dL — ABNORMAL HIGH (ref 70–99)
Glucose-Capillary: 158 mg/dL — ABNORMAL HIGH (ref 70–99)
Glucose-Capillary: 115 mg/dL — ABNORMAL HIGH (ref 70–99)
Glucose-Capillary: 142 mg/dL — ABNORMAL HIGH (ref 70–99)
Glucose-Capillary: 136 mg/dL — ABNORMAL HIGH (ref 70–99)
Glucose-Capillary: 150 mg/dL — ABNORMAL HIGH (ref 70–99)

## 2023-05-09 LAB — CBC WITH DIFFERENTIAL/PLATELET
Abs Immature Granulocytes: 0.19 10*3/uL — ABNORMAL HIGH (ref 0.00–0.07)
Basophils Absolute: 0 10*3/uL (ref 0.0–0.1)
Basophils Relative: 0 %
Eosinophils Absolute: 0 10*3/uL (ref 0.0–0.5)
Eosinophils Relative: 0 %
HCT: 35.7 % — ABNORMAL LOW (ref 39.0–52.0)
Hemoglobin: 11.6 g/dL — ABNORMAL LOW (ref 13.0–17.0)
Immature Granulocytes: 1 %
Lymphocytes Relative: 9 %
Lymphs Abs: 1.5 10*3/uL (ref 0.7–4.0)
MCH: 30.1 pg (ref 26.0–34.0)
MCHC: 32.5 g/dL (ref 30.0–36.0)
MCV: 92.7 fL (ref 80.0–100.0)
Monocytes Absolute: 1.1 10*3/uL — ABNORMAL HIGH (ref 0.1–1.0)
Monocytes Relative: 6 %
Neutro Abs: 15.2 10*3/uL — ABNORMAL HIGH (ref 1.7–7.7)
Neutrophils Relative %: 84 %
Platelets: 237 10*3/uL (ref 150–400)
RBC: 3.85 MIL/uL — ABNORMAL LOW (ref 4.22–5.81)
RDW: 15.7 % — ABNORMAL HIGH (ref 11.5–15.5)
WBC: 18 10*3/uL — ABNORMAL HIGH (ref 4.0–10.5)
nRBC: 0 % (ref 0.0–0.2)

## 2023-05-09 LAB — HEMOGLOBIN A1C
Hgb A1c MFr Bld: 6.5 % — ABNORMAL HIGH (ref 4.8–5.6)
Mean Plasma Glucose: 140 mg/dL

## 2023-05-09 LAB — MAGNESIUM: Magnesium: 2.4 mg/dL (ref 1.7–2.4)

## 2023-05-09 MED ORDER — OXYCODONE HCL 5 MG PO TABS
5.0000 mg | ORAL_TABLET | Freq: Three times a day (TID) | ORAL | Status: DC | PRN
  Administered 2023-05-09 – 2023-05-11 (×4): 5 mg via ORAL
  Filled 2023-05-09 (×4): qty 1

## 2023-05-09 MED ORDER — HYDROXYZINE HCL 25 MG PO TABS
ORAL_TABLET | ORAL | Status: AC
  Filled 2023-05-09: qty 2

## 2023-05-09 MED ORDER — METOPROLOL SUCCINATE ER 50 MG PO TB24
ORAL_TABLET | ORAL | Status: AC
  Filled 2023-05-09: qty 1

## 2023-05-09 MED ORDER — KETOROLAC TROMETHAMINE 15 MG/ML IJ SOLN
15.0000 mg | Freq: Once | INTRAMUSCULAR | Status: AC
  Administered 2023-05-09: 15 mg via INTRAVENOUS
  Filled 2023-05-09: qty 1

## 2023-05-09 MED ORDER — SUMATRIPTAN SUCCINATE 25 MG PO TABS
25.0000 mg | ORAL_TABLET | Freq: Every day | ORAL | Status: DC | PRN
  Administered 2023-05-10: 25 mg via ORAL
  Filled 2023-05-09 (×3): qty 1

## 2023-05-09 MED ORDER — ORAL CARE MOUTH RINSE: 15.0000 mL | OROMUCOSAL | Status: DC | PRN

## 2023-05-09 MED ORDER — OXYCODONE HCL ER 10 MG PO T12A
30.0000 mg | EXTENDED_RELEASE_TABLET | Freq: Three times a day (TID) | ORAL | Status: DC
  Administered 2023-05-09 – 2023-05-11 (×6): 30 mg via ORAL
  Filled 2023-05-09 (×5): qty 3
  Filled 2023-05-09: qty 2

## 2023-05-09 MED ORDER — DOCUSATE SODIUM 100 MG PO CAPS
100.0000 mg | ORAL_CAPSULE | Freq: Two times a day (BID) | ORAL | Status: DC
  Administered 2023-05-09 – 2023-05-11 (×5): 100 mg via ORAL
  Filled 2023-05-09 (×4): qty 1

## 2023-05-09 MED ORDER — HYDROXYZINE HCL 25 MG PO TABS
50.0000 mg | ORAL_TABLET | Freq: Two times a day (BID) | ORAL | Status: DC
  Administered 2023-05-09 – 2023-05-11 (×5): 50 mg via ORAL
  Filled 2023-05-09 (×4): qty 2

## 2023-05-09 MED ORDER — LIDOCAINE 5 % EX PTCH
1.0000 | MEDICATED_PATCH | CUTANEOUS | Status: DC
  Administered 2023-05-09 – 2023-05-11 (×3): 1 via TRANSDERMAL
  Filled 2023-05-09 (×2): qty 1

## 2023-05-09 MED ORDER — LIDOCAINE 5 % EX PTCH
MEDICATED_PATCH | CUTANEOUS | Status: AC
  Filled 2023-05-09: qty 1

## 2023-05-09 MED ORDER — METOPROLOL SUCCINATE ER 50 MG PO TB24
50.0000 mg | ORAL_TABLET | Freq: Every day | ORAL | Status: DC
  Administered 2023-05-09 – 2023-05-11 (×3): 50 mg via ORAL
  Filled 2023-05-09 (×2): qty 1

## 2023-05-09 MED ORDER — PHENOL 1.4 % MT LIQD
1.0000 | OROMUCOSAL | Status: DC | PRN
  Administered 2023-05-10 (×2): 1 via OROMUCOSAL
  Filled 2023-05-09: qty 177

## 2023-05-09 MED ORDER — CHLORHEXIDINE GLUCONATE 0.12 % MT SOLN
15.0000 mL | Freq: Once | OROMUCOSAL | Status: AC
  Administered 2023-05-09: 15 mL via OROMUCOSAL
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
## 2023-05-09 NOTE — Hospital Course (Signed)
This is a 47 year old male with a past medical history of Renal cell carcinoma, anxiety and depression who presented to the emergency department with concerns of respiratory distress after a bee sting. With concerns of anaphylactic shock and edema, patient was intubated and started on epi. He is a patient who previously had a trach. Patient has since self extubated and is doing well with no concerns. He is still on oxygen and is weaning off. He is resuming a diet today. He is still on steroids and benadryl. He is stable to move to med surg to continue to watch for further respiratory distress and to continue to wean off oxygen.

## 2023-05-09 NOTE — Progress Notes (Signed)
MD notified after pt states chest pain. New orders in

## 2023-05-09 NOTE — Progress Notes (Signed)
Brief Nutrition Note  Consult received for enteral feeds on ventilated patient. Pt self-extubated 7/19 in the afternoon. Pt tolerated well and was able to pass a bedside swallow evaluation and have diet advanced. RD will not follow at this time as no acute needs are identified. Please place new RD consult if needs arise.   Greig Castilla, RD, LDN Clinical Dietitian RD pager # available in [facility]  After hours/weekend pager # available in [facility]

## 2023-05-10 ENCOUNTER — Inpatient Hospital Stay (HOSPITAL_COMMUNITY): Payer: Medicaid Other

## 2023-05-10 DIAGNOSIS — F32A Depression, unspecified: Secondary | ICD-10-CM

## 2023-05-10 DIAGNOSIS — E119 Type 2 diabetes mellitus without complications: Secondary | ICD-10-CM

## 2023-05-10 DIAGNOSIS — R490 Dysphonia: Secondary | ICD-10-CM | POA: Diagnosis not present

## 2023-05-10 DIAGNOSIS — Z91030 Bee allergy status: Secondary | ICD-10-CM | POA: Diagnosis not present

## 2023-05-10 DIAGNOSIS — R079 Chest pain, unspecified: Secondary | ICD-10-CM

## 2023-05-10 DIAGNOSIS — G894 Chronic pain syndrome: Secondary | ICD-10-CM | POA: Diagnosis present

## 2023-05-10 DIAGNOSIS — F419 Anxiety disorder, unspecified: Secondary | ICD-10-CM | POA: Diagnosis not present

## 2023-05-10 DIAGNOSIS — T782XXA Anaphylactic shock, unspecified, initial encounter: Secondary | ICD-10-CM | POA: Diagnosis not present

## 2023-05-10 DIAGNOSIS — I10 Essential (primary) hypertension: Secondary | ICD-10-CM

## 2023-05-10 LAB — BASIC METABOLIC PANEL
Anion gap: 7 (ref 5–15)
BUN: 27 mg/dL — ABNORMAL HIGH (ref 6–20)
CO2: 24 mmol/L (ref 22–32)
Calcium: 8.6 mg/dL — ABNORMAL LOW (ref 8.9–10.3)
Chloride: 105 mmol/L (ref 98–111)
Creatinine, Ser: 0.95 mg/dL (ref 0.61–1.24)
GFR, Estimated: >60 mL/min (ref >60)
Glucose, Bld: 187 mg/dL — ABNORMAL HIGH (ref 70–99)
Potassium: 4.9 mmol/L (ref 3.5–5.1)
Sodium: 136 mmol/L (ref 135–145)

## 2023-05-10 LAB — CBC WITH DIFFERENTIAL/PLATELET
Abs Immature Granulocytes: 0.26 10*3/uL — ABNORMAL HIGH (ref 0.00–0.07)
Basophils Absolute: 0 10*3/uL (ref 0.0–0.1)
Basophils Relative: 0 %
Eosinophils Absolute: 0 10*3/uL (ref 0.0–0.5)
Eosinophils Relative: 0 %
HCT: 36.3 % — ABNORMAL LOW (ref 39.0–52.0)
Hemoglobin: 11.9 g/dL — ABNORMAL LOW (ref 13.0–17.0)
Immature Granulocytes: 2 %
Lymphocytes Relative: 9 %
Lymphs Abs: 1.2 10*3/uL (ref 0.7–4.0)
MCH: 30.4 pg (ref 26.0–34.0)
MCHC: 32.8 g/dL (ref 30.0–36.0)
MCV: 92.6 fL (ref 80.0–100.0)
Monocytes Absolute: 0.5 10*3/uL (ref 0.1–1.0)
Monocytes Relative: 3 %
Neutro Abs: 11.5 10*3/uL — ABNORMAL HIGH (ref 1.7–7.7)
Neutrophils Relative %: 86 %
Platelets: 251 10*3/uL (ref 150–400)
RBC: 3.92 MIL/uL — ABNORMAL LOW (ref 4.22–5.81)
RDW: 15.3 % (ref 11.5–15.5)
WBC: 13.4 10*3/uL — ABNORMAL HIGH (ref 4.0–10.5)
nRBC: 0 % (ref 0.0–0.2)

## 2023-05-10 LAB — GLUCOSE, CAPILLARY
Glucose-Capillary: 191 mg/dL — ABNORMAL HIGH (ref 70–99)
Glucose-Capillary: 162 mg/dL — ABNORMAL HIGH (ref 70–99)
Glucose-Capillary: 138 mg/dL — ABNORMAL HIGH (ref 70–99)
Glucose-Capillary: 103 mg/dL — ABNORMAL HIGH (ref 70–99)
Glucose-Capillary: 206 mg/dL — ABNORMAL HIGH (ref 70–99)
Glucose-Capillary: 311 mg/dL — ABNORMAL HIGH (ref 70–99)

## 2023-05-10 LAB — TROPONIN I (HIGH SENSITIVITY)
Troponin I (High Sensitivity): 9 ng/L (ref <18)
Troponin I (High Sensitivity): 8 ng/L (ref <18)

## 2023-05-10 LAB — LACTIC ACID, PLASMA
Lactic Acid, Venous: 1.5 mmol/L (ref 0.5–1.9)
Lactic Acid, Venous: 1 mmol/L (ref 0.5–1.9)

## 2023-05-10 LAB — BLOOD GAS, ARTERIAL
Acid-Base Excess: 5.6 mmol/L — ABNORMAL HIGH (ref 0.0–2.0)
Bicarbonate: 29.8 mmol/L — ABNORMAL HIGH (ref 20.0–28.0)
O2 Saturation: 97.9 %
Patient temperature: 36.8
pCO2 arterial: 41 mmHg (ref 32–48)
pH, Arterial: 7.47 — ABNORMAL HIGH (ref 7.35–7.45)
pO2, Arterial: 75 mmHg — ABNORMAL LOW (ref 83–108)

## 2023-05-10 LAB — PHOSPHORUS: Phosphorus: 4.5 mg/dL (ref 2.5–4.6)

## 2023-05-10 LAB — MAGNESIUM: Magnesium: 2.2 mg/dL (ref 1.7–2.4)

## 2023-05-10 MED ORDER — INSULIN DETEMIR 100 UNIT/ML ~~LOC~~ SOLN
20.0000 [IU] | Freq: Every day | SUBCUTANEOUS | Status: DC
  Filled 2023-05-10: qty 0.2

## 2023-05-10 MED ORDER — INSULIN DETEMIR 100 UNIT/ML ~~LOC~~ SOLN
10.0000 [IU] | Freq: Every day | SUBCUTANEOUS | Status: DC
  Administered 2023-05-10: 10 [IU] via SUBCUTANEOUS
  Filled 2023-05-10 (×2): qty 0.1

## 2023-05-10 MED ORDER — INSULIN ASPART 100 UNIT/ML IJ SOLN
0.0000 [IU] | Freq: Every day | INTRAMUSCULAR | Status: DC
  Administered 2023-05-10: 4 [IU] via SUBCUTANEOUS

## 2023-05-10 MED ORDER — ALPRAZOLAM 0.5 MG PO TABS
0.2500 mg | ORAL_TABLET | Freq: Once | ORAL | Status: AC
  Administered 2023-05-10: 0.25 mg via ORAL
  Filled 2023-05-10: qty 1

## 2023-05-10 MED ORDER — INSULIN ASPART 100 UNIT/ML IJ SOLN
0.0000 [IU] | Freq: Three times a day (TID) | INTRAMUSCULAR | Status: DC
  Administered 2023-05-10: 3 [IU] via SUBCUTANEOUS
  Administered 2023-05-10: 2 [IU] via SUBCUTANEOUS

## 2023-05-10 MED ORDER — METHYLPREDNISOLONE SODIUM SUCC 125 MG IJ SOLR
INTRAMUSCULAR | Status: AC
  Filled 2023-05-10: qty 2

## 2023-05-10 NOTE — Progress Notes (Signed)
Around 1445 PM, Patient was complaining of pins and needles in his throat when he swallows. He said its harder to swallow than yesterday. He has been using the chloraseptic spray without relief. Stated his breathing feels a bit more difficult. O2 sats got fine, initiated breathing treatment. Made the provider aware.
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
## 2023-05-10 NOTE — Progress Notes (Signed)
ASked by [facility] to evaluate patient at bedside along with rapid response because CCM Was familiar with patient yesterday. AT bedsie he c/o head hurting, pain in chest when breathing and also razors back in throat. RN says voice is worse than morning but to CCM MD and residen is much better than yesterday  OBje He looks better tha yesterda Face pink similar to yesterdaoiu Voice hoarse but bettter than yesterday and is only mildly haose CTA bilaterally. NO STRIDOR, No wheeze.  Nromal heart sounds BP 140/90 HR 84/min Pusle ox - 95% RA  EKG - NSR and nil acute. Normal EKG   Negative samson criteria for anaphylasix  LABS today normal  A Subjective hoarsenss and other symtoms SAMSON criteria negative for anaphylasix No objectie focal issues  Unclear what is going on  ? Anxiety  Plan - Check abg  - check lactate  - check trop - check tryptasue  - [facility] to consider ENT consult  - No indication for ICU transfer     SIGNATURE    Dr. Kalman Shan, M.D., F.C.C.P,  Pulmonary and Critical Care Medicine Staff Physician, [facility] Director - Interstitial Lung Disease  Program  Pulmonary Fibrosis [facility] at [facility] [place], [place], [address]   Pager: [phone], If no answer  -> Check AMION or Try [phone] Telephone (clinical office): [phone] Telephone (research): [phone]  3:49 PM 05/10/2023    xxxx    Anaphylaxis is highly likely when any ONE of the following three criteria is fulfilled: - DOES NOT HAVE any criteria  1. Acute onset of an illness (minutes to several hours) with involvement of the skin, mucosal tissue, or both (eg, generalized hives, pruritus or flushing, swollen lips-tongue-uvula)  - DOES NOT HAVE  AND AT LEAST ONE OF THE FOLLOWING: A. Respiratory compromise (eg, dyspnea, wheeze-bronchospasm, stridor, hypoxemia) - c/o RAZORS in throt but voice better than 24h ago when in ICU and not  evidence of any of these symtpoms crruent B. Reduced BP* or associated symptoms of end-organ dysfunction (eg, hypotonia, collapse, syncope, incontinence) - NO  2. TWO OR MORE OF THE FOLLOWING that occur rapidly after exposure to a LIKELY allergen for that patient (minutes to several hours): A. Involvement of the skin mucosal tissue (eg, generalized hives, itch-flush, swollen lips-tongue-uvula) -NO B. Respiratory compromise (eg, dyspnea, wheeze-bronchospasm, stridor, hypoxemia)  - NO C. Reduced BP* or associated symptoms (eg, hypotonia, collapse, syncope, incontinence) - NO D. Persistent gastrointestinal symptoms (eg, crampy abdominal pain, vomiting) - NO  3. Reduced BP* after exposure to a KNOWN allergen for that patient (minutes to several hours): A. Infants and children - Low systolic BP (age-specific)* or greater than 30% decrease in systolic BP B. Adults - Systolic BP of less than 90 mmHg or greater than 30% decrease from that person's baseline - NO  xxxxxxx =    LABS    PULMONARY Recent Labs  Lab 05/07/23 1352 05/07/23 1613  PHART  --  7.141*  PCO2ART  --  64.0*  PO2ART  --  87  HCO3 21.9 21.6  TCO2 23 23  O2SAT 99 92    CBC Recent Labs  Lab 05/08/23 0701 05/09/23 1230 05/10/23 0402  HGB 11.4* 11.6* 11.9*  HCT 34.0* 35.7* 36.3*  WBC 14.3* 18.0* 13.4*  PLT 206 237 251    COAGULATION No results for input(s): "INR" in the last 168 hours.  CARDIAC  No results for input(s): "TROPONINI" in  the last 168 hours. No results for input(s): "PROBNP" in the last 168 hours.   CHEMISTRY Recent Labs  Lab 05/07/23 1322 05/07/23 1352 05/07/23 1613 05/08/23 0701 05/09/23 1230 05/10/23 0402  NA 128* 130* 132* 134* 136 136  K 4.2 4.3 3.8 4.9 4.8 4.9  CL 96*  --   --  106 106 105  CO2 21*  --   --  22 23 24   GLUCOSE 166*  --   --  131* 146* 187*  BUN 35*  --   --  25* 26* 27*  CREATININE 2.04*  --   --  1.24 1.01 0.95  CALCIUM 8.6*  --   --  8.8* 8.5* 8.6*  MG 2.2   --   --  2.3 2.4 2.2  PHOS  --   --   --  2.1* 3.6 4.5   Estimated Creatinine Clearance: 146.7 mL/min (by C-G formula based on SCr of 0.95 mg/dL).   LIVER Recent Labs  Lab 05/07/23 1322  AST 44*  ALT 34  ALKPHOS 59  BILITOT 0.5  PROT 6.4*  ALBUMIN 3.7     INFECTIOUS Recent Labs  Lab 05/10/23 0402  LATICACIDVEN 1.5     ENDOCRINE CBG (last 3)  Recent Labs    05/10/23 0625 05/10/23 0756 05/10/23 1148  GLUCAP 191* 162* 138*         IMAGING x48h  - image(s) personally visualized  -   highlighted in bold No results found.

## 2023-05-10 NOTE — Plan of Care (Signed)

## 2023-05-11 ENCOUNTER — Encounter (HOSPITAL_COMMUNITY): Payer: Self-pay | Admitting: Pulmonary Disease

## 2023-05-11 ENCOUNTER — Other Ambulatory Visit: Payer: Self-pay

## 2023-05-11 DIAGNOSIS — Z91030 Bee allergy status: Secondary | ICD-10-CM

## 2023-05-11 DIAGNOSIS — T782XXA Anaphylactic shock, unspecified, initial encounter: Secondary | ICD-10-CM | POA: Diagnosis not present

## 2023-05-11 DIAGNOSIS — G894 Chronic pain syndrome: Secondary | ICD-10-CM | POA: Diagnosis not present

## 2023-05-11 DIAGNOSIS — F419 Anxiety disorder, unspecified: Secondary | ICD-10-CM | POA: Diagnosis not present

## 2023-05-11 LAB — GLUCOSE, CAPILLARY
Glucose-Capillary: 117 mg/dL — ABNORMAL HIGH (ref 70–99)
Glucose-Capillary: 129 mg/dL — ABNORMAL HIGH (ref 70–99)
Glucose-Capillary: 146 mg/dL — ABNORMAL HIGH (ref 70–99)
Glucose-Capillary: 70 mg/dL (ref 70–99)
Glucose-Capillary: 86 mg/dL (ref 70–99)

## 2023-05-11 LAB — BASIC METABOLIC PANEL
Anion gap: 8 (ref 5–15)
BUN: 22 mg/dL — ABNORMAL HIGH (ref 6–20)
CO2: 27 mmol/L (ref 22–32)
Calcium: 9 mg/dL (ref 8.9–10.3)
Chloride: 102 mmol/L (ref 98–111)
Creatinine, Ser: 0.96 mg/dL (ref 0.61–1.24)
GFR, Estimated: >60 mL/min (ref >60)
Glucose, Bld: 123 mg/dL — ABNORMAL HIGH (ref 70–99)
Potassium: 4.3 mmol/L (ref 3.5–5.1)
Sodium: 137 mmol/L (ref 135–145)

## 2023-05-11 MED ORDER — LORAZEPAM 2 MG/ML IJ SOLN
0.5000 mg | Freq: Once | INTRAMUSCULAR | Status: AC
  Administered 2023-05-11: 0.5 mg via INTRAVENOUS
  Filled 2023-05-11: qty 1

## 2023-05-11 MED ORDER — METHYLPREDNISOLONE SODIUM SUCC 125 MG IJ SOLR
80.0000 mg | INTRAMUSCULAR | Status: DC
  Administered 2023-05-11: 80 mg via INTRAVENOUS
  Filled 2023-05-11: qty 2

## 2023-05-11 MED ORDER — PANTOPRAZOLE SODIUM 40 MG PO TBEC
40.0000 mg | DELAYED_RELEASE_TABLET | Freq: Every day | ORAL | Status: DC
  Administered 2023-05-11: 40 mg via ORAL
  Filled 2023-05-11: qty 1

## 2023-05-11 MED ORDER — FAMOTIDINE 20 MG PO TABS
20.0000 mg | ORAL_TABLET | Freq: Two times a day (BID) | ORAL | Status: DC
  Administered 2023-05-11: 20 mg via ORAL
  Filled 2023-05-11: qty 1

## 2023-05-11 MED ORDER — EPINEPHRINE 0.3 MG/0.3ML IJ SOAJ: 0.3000 mg | INTRAMUSCULAR | 1 refills | Status: DC | PRN

## 2023-05-11 MED ORDER — ALPRAZOLAM 0.5 MG PO TABS
0.5000 mg | ORAL_TABLET | Freq: Every day | ORAL | Status: DC
  Administered 2023-05-11: 0.5 mg via ORAL
  Filled 2023-05-11: qty 1

## 2023-05-11 MED ORDER — OLOPATADINE HCL 0.1 % OP SOLN: 1.0000 [drp] | Freq: Two times a day (BID) | OPHTHALMIC | 0 refills | Status: AC

## 2023-05-11 MED ORDER — PREDNISONE 10 MG PO TABS: ORAL_TABLET | ORAL | 0 refills | Status: DC

## 2023-05-11 MED ORDER — MONTELUKAST SODIUM 10 MG PO TABS: 10.0000 mg | ORAL_TABLET | Freq: Every day | ORAL | Status: DC

## 2023-05-11 MED ORDER — OLOPATADINE HCL 0.1 % OP SOLN
1.0000 [drp] | Freq: Two times a day (BID) | OPHTHALMIC | Status: DC
  Administered 2023-05-11: 1 [drp] via OPHTHALMIC
  Filled 2023-05-11: qty 5

## 2023-05-11 MED ORDER — MONTELUKAST SODIUM 10 MG PO TABS: 10.0000 mg | ORAL_TABLET | Freq: Every day | ORAL | 3 refills | Status: DC

## 2023-05-11 MED ORDER — ALPRAZOLAM 0.5 MG PO TABS: 0.5000 mg | ORAL_TABLET | Freq: Two times a day (BID) | ORAL | 0 refills | Status: DC | PRN

## 2023-05-11 MED ORDER — ALPRAZOLAM 0.5 MG PO TABS: 0.5000 mg | ORAL_TABLET | Freq: Two times a day (BID) | ORAL | Status: DC | PRN

## 2023-05-11 NOTE — Plan of Care (Signed)
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
## 2023-05-11 NOTE — Procedures (Signed)
Preop diagnosis: Hoarseness and throat pain Postop diagnosis: same Procedure: Transnasal fiberoptic laryngoscopy Surgeon: Jenne Pane Anesth: Topical with 4% lidocaine Compl: None Findings: Renke's edema of vocal folds without mass or ulceration.  Normal vocal fold movement.  Pharynx and larynx structures otherwise normal. Description:  After discussing risks, benefits, and alternatives, the patient was placed in a seated position and the right nasal passage was sprayed with topical anesthetic.  The fiberoptic scope was passed through the right nasal passage to view the pharynx and larynx.  Findings are noted above.  The scope was then removed and he was returned to nursing care in stable condition.
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
## 2023-05-12 ENCOUNTER — Telehealth: Payer: Self-pay | Admitting: *Deleted

## 2023-05-12 LAB — TRYPTASE
Tryptase: 4.8 ug/L (ref 2.2–13.2)
Tryptase: 5.2 ug/L (ref 2.2–13.2)

## 2023-05-12 NOTE — Telephone Encounter (Signed)
Patient requesting refill on Xtampza

## 2023-05-12 NOTE — Telephone Encounter (Signed)
Return Mr. Ami call, no answer. Left message to return the call. He should call his pharmacy for refill on his Xtampza, prescription was already sent.

## 2023-05-27 ENCOUNTER — Emergency Department (HOSPITAL_COMMUNITY): Payer: Medicaid Other

## 2023-05-27 ENCOUNTER — Encounter (HOSPITAL_COMMUNITY): Payer: Self-pay

## 2023-05-27 ENCOUNTER — Emergency Department (HOSPITAL_COMMUNITY)
Admission: EM | Admit: 2023-05-27 | Discharge: 2023-05-27 | Disposition: A | Discharge status: Home / Self Care | Payer: Medicaid Other | Attending: Emergency Medicine | Admitting: Emergency Medicine

## 2023-05-27 DIAGNOSIS — Z7984 Long term (current) use of oral hypoglycemic drugs: Secondary | ICD-10-CM | POA: Diagnosis not present

## 2023-05-27 DIAGNOSIS — Z85528 Personal history of other malignant neoplasm of kidney: Secondary | ICD-10-CM | POA: Insufficient documentation

## 2023-05-27 DIAGNOSIS — G8929 Other chronic pain: Secondary | ICD-10-CM | POA: Insufficient documentation

## 2023-05-27 DIAGNOSIS — I10 Essential (primary) hypertension: Secondary | ICD-10-CM | POA: Insufficient documentation

## 2023-05-27 DIAGNOSIS — E119 Type 2 diabetes mellitus without complications: Secondary | ICD-10-CM | POA: Insufficient documentation

## 2023-05-27 DIAGNOSIS — Z79899 Other long term (current) drug therapy: Secondary | ICD-10-CM | POA: Diagnosis not present

## 2023-05-27 DIAGNOSIS — M545 Low back pain, unspecified: Secondary | ICD-10-CM | POA: Diagnosis present

## 2023-05-27 LAB — I-STAT CHEM 8, ED
BUN: 12 mg/dL (ref 6–20)
Calcium, Ion: 1.16 mmol/L (ref 1.15–1.40)
Chloride: 100 mmol/L (ref 98–111)
Creatinine, Ser: 0.9 mg/dL (ref 0.61–1.24)
Glucose, Bld: 163 mg/dL — ABNORMAL HIGH (ref 70–99)
HCT: 37 % — ABNORMAL LOW (ref 39.0–52.0)
Hemoglobin: 12.6 g/dL — ABNORMAL LOW (ref 13.0–17.0)
Potassium: 4.1 mmol/L (ref 3.5–5.1)
Sodium: 136 mmol/L (ref 135–145)
TCO2: 27 mmol/L (ref 22–32)

## 2023-05-27 MED ORDER — OXYCODONE-ACETAMINOPHEN 5-325 MG PO TABS
1.0000 | ORAL_TABLET | ORAL | Status: AC | PRN
  Administered 2023-05-27 (×2): 1 via ORAL
  Filled 2023-05-27 (×2): qty 1

## 2023-05-27 MED ORDER — HYDROMORPHONE HCL 1 MG/ML IJ SOLN
1.0000 mg | Freq: Once | INTRAMUSCULAR | Status: AC
  Administered 2023-05-27: 1 mg via INTRAVENOUS
  Filled 2023-05-27: qty 1

## 2023-05-27 MED ORDER — GADOBUTROL 1 MMOL/ML IV SOLN
10.0000 mL | Freq: Once | INTRAVENOUS | Status: AC | PRN
  Administered 2023-05-27: 10 mL via INTRAVENOUS

## 2023-05-27 MED ORDER — DEXAMETHASONE SODIUM PHOSPHATE 10 MG/ML IJ SOLN
10.0000 mg | Freq: Once | INTRAMUSCULAR | Status: AC
  Administered 2023-05-27: 10 mg via INTRAVENOUS
  Filled 2023-05-27: qty 1

## 2023-05-27 NOTE — Discharge Instructions (Signed)
You were seen in the emergency department today for back pain.  Please call [facility] to try and work in sooner to be seen for intervention to your back pain.  Additionally please call your pain management team for further control of your symptoms.  Please return to emergency department if you are unable to control your bowel or bladder, have numbness to your perineum/groin or weakness in your legs.

## 2023-05-27 NOTE — ED Notes (Signed)
Assumed care of patient to room via wheel chair c/o chronic back pain unrelieved by home medications. Patient sees a neurologist and has a nerve ablation scheduled but just can't take the pain tonight. Patient able to transfer from wheel chair to bed with some difficulty. Patient denies numbness tingling and loss of bowel or bladder. Patient a/o x 4 respirations even and non labored provided pain medication in triage. Patient pending medical evaluation
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
## 2023-05-27 NOTE — ED Triage Notes (Signed)
Patient arrives with back pain. States it hurts to sit back. Patent is tearful in triage. States he can't sit down or lay down without his back hurting. Patient sees a neurologist and is scheduled to have an ablation of the nerves. Patient is taking his prescribed medication with no relief. Patient took his home dose of medication at 3am.

## 2023-06-02 ENCOUNTER — Inpatient Hospital Stay (HOSPITAL_COMMUNITY): Payer: Medicaid Other

## 2023-06-02 ENCOUNTER — Encounter (HOSPITAL_COMMUNITY): Payer: Self-pay | Admitting: *Deleted

## 2023-06-02 ENCOUNTER — Observation Stay (HOSPITAL_COMMUNITY)
Admission: EM | Admit: 2023-06-02 | Discharge: 2023-06-03 | Disposition: A | Discharge status: Home / Self Care | Payer: Medicaid Other | Attending: Internal Medicine | Admitting: Internal Medicine

## 2023-06-02 ENCOUNTER — Other Ambulatory Visit: Payer: Self-pay

## 2023-06-02 ENCOUNTER — Emergency Department (HOSPITAL_COMMUNITY): Payer: Medicaid Other

## 2023-06-02 DIAGNOSIS — Z905 Acquired absence of kidney: Secondary | ICD-10-CM | POA: Diagnosis not present

## 2023-06-02 DIAGNOSIS — Z825 Family history of asthma and other chronic lower respiratory diseases: Secondary | ICD-10-CM

## 2023-06-02 DIAGNOSIS — F32A Depression, unspecified: Secondary | ICD-10-CM | POA: Diagnosis not present

## 2023-06-02 DIAGNOSIS — F419 Anxiety disorder, unspecified: Secondary | ICD-10-CM | POA: Diagnosis present

## 2023-06-02 DIAGNOSIS — Z79891 Long term (current) use of opiate analgesic: Secondary | ICD-10-CM | POA: Diagnosis not present

## 2023-06-02 DIAGNOSIS — T782XXA Anaphylactic shock, unspecified, initial encounter: Principal | ICD-10-CM

## 2023-06-02 DIAGNOSIS — Z8249 Family history of ischemic heart disease and other diseases of the circulatory system: Secondary | ICD-10-CM | POA: Diagnosis not present

## 2023-06-02 DIAGNOSIS — Z7951 Long term (current) use of inhaled steroids: Secondary | ICD-10-CM | POA: Insufficient documentation

## 2023-06-02 DIAGNOSIS — Z833 Family history of diabetes mellitus: Secondary | ICD-10-CM

## 2023-06-02 DIAGNOSIS — T783XXA Angioneurotic edema, initial encounter: Secondary | ICD-10-CM | POA: Diagnosis not present

## 2023-06-02 DIAGNOSIS — I10 Essential (primary) hypertension: Secondary | ICD-10-CM | POA: Diagnosis not present

## 2023-06-02 DIAGNOSIS — K7460 Unspecified cirrhosis of liver: Secondary | ICD-10-CM | POA: Diagnosis present

## 2023-06-02 DIAGNOSIS — Z823 Family history of stroke: Secondary | ICD-10-CM | POA: Diagnosis not present

## 2023-06-02 DIAGNOSIS — F17210 Nicotine dependence, cigarettes, uncomplicated: Secondary | ICD-10-CM | POA: Diagnosis present

## 2023-06-02 DIAGNOSIS — G894 Chronic pain syndrome: Secondary | ICD-10-CM | POA: Diagnosis present

## 2023-06-02 DIAGNOSIS — T63441A Toxic effect of venom of bees, accidental (unintentional), initial encounter: Principal | ICD-10-CM | POA: Insufficient documentation

## 2023-06-02 DIAGNOSIS — F4310 Post-traumatic stress disorder, unspecified: Secondary | ICD-10-CM | POA: Diagnosis present

## 2023-06-02 DIAGNOSIS — Z85528 Personal history of other malignant neoplasm of kidney: Secondary | ICD-10-CM

## 2023-06-02 DIAGNOSIS — T63481A Toxic effect of venom of other arthropod, accidental (unintentional), initial encounter: Secondary | ICD-10-CM | POA: Diagnosis not present

## 2023-06-02 DIAGNOSIS — Z91030 Bee allergy status: Secondary | ICD-10-CM

## 2023-06-02 DIAGNOSIS — X58XXXA Exposure to other specified factors, initial encounter: Secondary | ICD-10-CM | POA: Diagnosis present

## 2023-06-02 DIAGNOSIS — Z79899 Other long term (current) drug therapy: Secondary | ICD-10-CM | POA: Diagnosis not present

## 2023-06-02 LAB — COMPREHENSIVE METABOLIC PANEL
ALT: 22 U/L (ref 0–44)
AST: 23 U/L (ref 15–41)
Albumin: 3.6 g/dL (ref 3.5–5.0)
Alkaline Phosphatase: 69 U/L (ref 38–126)
Anion gap: 14 (ref 5–15)
BUN: 18 mg/dL (ref 6–20)
CO2: 23 mmol/L (ref 22–32)
Calcium: 8.9 mg/dL (ref 8.9–10.3)
Chloride: 102 mmol/L (ref 98–111)
Creatinine, Ser: 1.1 mg/dL (ref 0.61–1.24)
GFR, Estimated: >60 mL/min (ref >60)
Glucose, Bld: 158 mg/dL — ABNORMAL HIGH (ref 70–99)
Potassium: 4.3 mmol/L (ref 3.5–5.1)
Sodium: 139 mmol/L (ref 135–145)
Total Bilirubin: 0.3 mg/dL (ref 0.3–1.2)
Total Protein: 6.6 g/dL (ref 6.5–8.1)

## 2023-06-02 LAB — URINALYSIS, W/ REFLEX TO CULTURE (INFECTION SUSPECTED)
Bacteria, UA: NONE SEEN
Bilirubin Urine: NEGATIVE
Glucose, UA: NEGATIVE mg/dL
Hgb urine dipstick: NEGATIVE
Ketones, ur: NEGATIVE mg/dL
Leukocytes,Ua: NEGATIVE
Nitrite: NEGATIVE
Protein, ur: NEGATIVE mg/dL
Specific Gravity, Urine: 1.004 — ABNORMAL LOW (ref 1.005–1.030)
pH: 7 (ref 5.0–8.0)

## 2023-06-02 LAB — RAPID URINE DRUG SCREEN, HOSP PERFORMED
Amphetamines: NOT DETECTED
Barbiturates: NOT DETECTED
Benzodiazepines: NOT DETECTED
Cocaine: NOT DETECTED
Opiates: NOT DETECTED
Tetrahydrocannabinol: NOT DETECTED

## 2023-06-02 LAB — CBC
HCT: 33.8 % — ABNORMAL LOW (ref 39.0–52.0)
Hemoglobin: 11 g/dL — ABNORMAL LOW (ref 13.0–17.0)
MCH: 31.4 pg (ref 26.0–34.0)
MCHC: 32.5 g/dL (ref 30.0–36.0)
MCV: 96.6 fL (ref 80.0–100.0)
Platelets: 215 10*3/uL (ref 150–400)
RBC: 3.5 MIL/uL — ABNORMAL LOW (ref 4.22–5.81)
RDW: 14.4 % (ref 11.5–15.5)
WBC: 10.3 10*3/uL (ref 4.0–10.5)
nRBC: 0 % (ref 0.0–0.2)

## 2023-06-02 LAB — BASIC METABOLIC PANEL
Anion gap: 15 (ref 5–15)
BUN: 19 mg/dL (ref 6–20)
CO2: 18 mmol/L — ABNORMAL LOW (ref 22–32)
Calcium: 7.1 mg/dL — ABNORMAL LOW (ref 8.9–10.3)
Chloride: 107 mmol/L (ref 98–111)
Creatinine, Ser: 1.14 mg/dL (ref 0.61–1.24)
GFR, Estimated: >60 mL/min (ref >60)
Glucose, Bld: 200 mg/dL — ABNORMAL HIGH (ref 70–99)
Potassium: 3.5 mmol/L (ref 3.5–5.1)
Sodium: 140 mmol/L (ref 135–145)

## 2023-06-02 LAB — GLUCOSE, CAPILLARY
Glucose-Capillary: 230 mg/dL — ABNORMAL HIGH (ref 70–99)
Glucose-Capillary: 190 mg/dL — ABNORMAL HIGH (ref 70–99)
Glucose-Capillary: 151 mg/dL — ABNORMAL HIGH (ref 70–99)
Glucose-Capillary: 179 mg/dL — ABNORMAL HIGH (ref 70–99)
Glucose-Capillary: 149 mg/dL — ABNORMAL HIGH (ref 70–99)

## 2023-06-02 LAB — CBC WITH DIFFERENTIAL/PLATELET
Abs Immature Granulocytes: 0.06 10*3/uL (ref 0.00–0.07)
Basophils Absolute: 0 10*3/uL (ref 0.0–0.1)
Basophils Relative: 0 %
Eosinophils Absolute: 0.8 10*3/uL — ABNORMAL HIGH (ref 0.0–0.5)
Eosinophils Relative: 9 %
HCT: 40.1 % (ref 39.0–52.0)
Hemoglobin: 13.3 g/dL (ref 13.0–17.0)
Immature Granulocytes: 1 %
Lymphocytes Relative: 33 %
Lymphs Abs: 3.1 10*3/uL (ref 0.7–4.0)
MCH: 30.7 pg (ref 26.0–34.0)
MCHC: 33.2 g/dL (ref 30.0–36.0)
MCV: 92.6 fL (ref 80.0–100.0)
Monocytes Absolute: 0.7 10*3/uL (ref 0.1–1.0)
Monocytes Relative: 8 %
Neutro Abs: 4.6 10*3/uL (ref 1.7–7.7)
Neutrophils Relative %: 49 %
Platelets: 279 10*3/uL (ref 150–400)
RBC: 4.33 MIL/uL (ref 4.22–5.81)
RDW: 14.1 % (ref 11.5–15.5)
WBC: 9.3 10*3/uL (ref 4.0–10.5)
nRBC: 0 % (ref 0.0–0.2)

## 2023-06-02 LAB — I-STAT CHEM 8, ED
BUN: 23 mg/dL — ABNORMAL HIGH (ref 6–20)
Calcium, Ion: 1.14 mmol/L — ABNORMAL LOW (ref 1.15–1.40)
Chloride: 105 mmol/L (ref 98–111)
Creatinine, Ser: 1 mg/dL (ref 0.61–1.24)
Glucose, Bld: 154 mg/dL — ABNORMAL HIGH (ref 70–99)
HCT: 40 % (ref 39.0–52.0)
Hemoglobin: 13.6 g/dL (ref 13.0–17.0)
Potassium: 4.2 mmol/L (ref 3.5–5.1)
Sodium: 139 mmol/L (ref 135–145)
TCO2: 26 mmol/L (ref 22–32)

## 2023-06-02 LAB — TROPONIN I (HIGH SENSITIVITY)
Troponin I (High Sensitivity): 4 ng/L (ref <18)
Troponin I (High Sensitivity): 4 ng/L (ref <18)

## 2023-06-02 LAB — MAGNESIUM: Magnesium: 1.7 mg/dL (ref 1.7–2.4)

## 2023-06-02 LAB — BRAIN NATRIURETIC PEPTIDE: B Natriuretic Peptide: 18.8 pg/mL (ref 0.0–100.0)

## 2023-06-02 LAB — MRSA NEXT GEN BY PCR, NASAL: MRSA by PCR Next Gen: NOT DETECTED

## 2023-06-02 MED ORDER — HEPARIN SODIUM (PORCINE) 5000 UNIT/ML IJ SOLN
5000.0000 [IU] | Freq: Three times a day (TID) | INTRAMUSCULAR | Status: DC
  Administered 2023-06-02 – 2023-06-03 (×2): 5000 [IU] via SUBCUTANEOUS
  Filled 2023-06-02 (×2): qty 1

## 2023-06-02 MED ORDER — INSULIN ASPART 100 UNIT/ML IJ SOLN
0.0000 [IU] | Freq: Three times a day (TID) | INTRAMUSCULAR | Status: DC
  Administered 2023-06-02 (×3): 3 [IU] via SUBCUTANEOUS

## 2023-06-02 MED ORDER — LORATADINE 10 MG PO TABS: 10.0000 mg | ORAL_TABLET | Freq: Every day | ORAL | Status: DC

## 2023-06-02 MED ORDER — CHLORHEXIDINE GLUCONATE CLOTH 2 % EX PADS
6.0000 | MEDICATED_PAD | Freq: Every day | CUTANEOUS | Status: DC
  Administered 2023-06-02: 6 via TOPICAL

## 2023-06-02 MED ORDER — ORAL CARE MOUTH RINSE: 15.0000 mL | OROMUCOSAL | Status: DC | PRN

## 2023-06-02 MED ORDER — ALPRAZOLAM 0.5 MG PO TABS
0.5000 mg | ORAL_TABLET | Freq: Two times a day (BID) | ORAL | Status: DC | PRN
  Administered 2023-06-02: 0.5 mg via ORAL
  Filled 2023-06-02: qty 1

## 2023-06-02 MED ORDER — METHYLPREDNISOLONE SODIUM SUCC 40 MG IJ SOLR
40.0000 mg | Freq: Two times a day (BID) | INTRAMUSCULAR | Status: DC
  Administered 2023-06-02: 40 mg via INTRAVENOUS
  Filled 2023-06-02: qty 1

## 2023-06-02 MED ORDER — QUETIAPINE FUMARATE ER 50 MG PO TB24
50.0000 mg | ORAL_TABLET | Freq: Every day | ORAL | Status: DC
  Administered 2023-06-02 – 2023-06-03 (×2): 50 mg via ORAL
  Filled 2023-06-02 (×2): qty 1

## 2023-06-02 MED ORDER — ALPRAZOLAM 0.5 MG PO TABS
1.0000 mg | ORAL_TABLET | Freq: Three times a day (TID) | ORAL | Status: DC | PRN
  Administered 2023-06-02 – 2023-06-03 (×3): 1 mg via ORAL
  Filled 2023-06-02 (×3): qty 2

## 2023-06-02 MED ORDER — METAXALONE 800 MG PO TABS
800.0000 mg | ORAL_TABLET | Freq: Three times a day (TID) | ORAL | Status: DC | PRN
  Administered 2023-06-02: 800 mg via ORAL
  Filled 2023-06-02 (×2): qty 1

## 2023-06-02 MED ORDER — HYDROMORPHONE HCL 1 MG/ML IJ SOLN
1.0000 mg | INTRAMUSCULAR | Status: DC | PRN
  Administered 2023-06-02: 1 mg via INTRAVENOUS
  Filled 2023-06-02: qty 1

## 2023-06-02 MED ORDER — OXYCODONE HCL 5 MG PO TABS
5.0000 mg | ORAL_TABLET | ORAL | Status: DC | PRN
  Administered 2023-06-02: 5 mg via ORAL
  Filled 2023-06-02: qty 1

## 2023-06-02 MED ORDER — IPRATROPIUM-ALBUTEROL 0.5-2.5 (3) MG/3ML IN SOLN
3.0000 mL | Freq: Once | RESPIRATORY_TRACT | Status: AC
  Administered 2023-06-02: 3 mL via RESPIRATORY_TRACT
  Filled 2023-06-02: qty 3

## 2023-06-02 MED ORDER — PREDNISONE 10 MG PO TABS
10.0000 mg | ORAL_TABLET | Freq: Every day | ORAL | Status: DC
  Administered 2023-06-03: 10 mg via ORAL
  Filled 2023-06-02: qty 1

## 2023-06-02 MED ORDER — NICOTINE 21 MG/24HR TD PT24
21.0000 mg | MEDICATED_PATCH | Freq: Every day | TRANSDERMAL | Status: DC
  Administered 2023-06-02 – 2023-06-03 (×3): 21 mg via TRANSDERMAL
  Filled 2023-06-02 (×4): qty 1

## 2023-06-02 MED ORDER — LORAZEPAM 2 MG/ML IJ SOLN
1.0000 mg | Freq: Once | INTRAMUSCULAR | Status: AC
  Administered 2023-06-02: 1 mg via INTRAVENOUS
  Filled 2023-06-02: qty 1

## 2023-06-02 MED ORDER — PANTOPRAZOLE SODIUM 40 MG PO TBEC
40.0000 mg | DELAYED_RELEASE_TABLET | Freq: Every day | ORAL | Status: DC
  Administered 2023-06-02 – 2023-06-03 (×2): 40 mg via ORAL
  Filled 2023-06-02 (×2): qty 1

## 2023-06-02 MED ORDER — SODIUM CHLORIDE 0.9 % IV SOLN
40.0000 mg | Freq: Two times a day (BID) | INTRAVENOUS | Status: DC
  Filled 2023-06-02 (×4): qty 4

## 2023-06-02 MED ORDER — FLUTICASONE PROPIONATE 50 MCG/ACT NA SUSP
1.0000 | Freq: Every day | NASAL | Status: DC
  Administered 2023-06-02 (×2): 1 via NASAL
  Filled 2023-06-02: qty 16

## 2023-06-02 MED ORDER — LORATADINE 10 MG PO TABS
10.0000 mg | ORAL_TABLET | Freq: Every day | ORAL | Status: DC
  Administered 2023-06-02 – 2023-06-03 (×2): 10 mg via ORAL
  Filled 2023-06-02 (×2): qty 1

## 2023-06-02 MED ORDER — OXYCODONE HCL 5 MG PO TABS
5.0000 mg | ORAL_TABLET | ORAL | Status: DC | PRN
  Administered 2023-06-02 – 2023-06-03 (×3): 5 mg via ORAL
  Filled 2023-06-02 (×3): qty 1

## 2023-06-02 MED ORDER — POTASSIUM CHLORIDE CRYS ER 20 MEQ PO TBCR
40.0000 meq | EXTENDED_RELEASE_TABLET | Freq: Once | ORAL | Status: AC
  Administered 2023-06-02: 40 meq via ORAL
  Filled 2023-06-02: qty 2

## 2023-06-02 MED ORDER — FAMOTIDINE IN NACL 20-0.9 MG/50ML-% IV SOLN
20.0000 mg | Freq: Once | INTRAVENOUS | Status: AC
  Administered 2023-06-02: 20 mg via INTRAVENOUS
  Filled 2023-06-02: qty 50

## 2023-06-02 MED ORDER — PREDNISONE 10 MG PO TABS: 10.0000 mg | ORAL_TABLET | Freq: Every day | ORAL | Status: DC

## 2023-06-02 MED ORDER — MAGNESIUM SULFATE 2 GM/50ML IV SOLN
2.0000 g | Freq: Once | INTRAVENOUS | Status: AC
  Administered 2023-06-02: 2 g via INTRAVENOUS
  Filled 2023-06-02: qty 50

## 2023-06-02 MED ORDER — SODIUM CHLORIDE 0.9 % IV BOLUS: 1000.0000 mL | Freq: Once | INTRAVENOUS | Status: DC

## 2023-06-02 MED ORDER — FENTANYL CITRATE PF 50 MCG/ML IJ SOSY
50.0000 ug | PREFILLED_SYRINGE | Freq: Once | INTRAMUSCULAR | Status: AC
  Administered 2023-06-02: 50 ug via INTRAVENOUS
  Filled 2023-06-02: qty 1

## 2023-06-02 MED ORDER — MONTELUKAST SODIUM 10 MG PO TABS
10.0000 mg | ORAL_TABLET | Freq: Every day | ORAL | Status: DC
  Administered 2023-06-02: 10 mg via ORAL
  Filled 2023-06-02: qty 1

## 2023-06-02 MED ORDER — IPRATROPIUM-ALBUTEROL 0.5-2.5 (3) MG/3ML IN SOLN: 3.0000 mL | RESPIRATORY_TRACT | Status: DC | PRN

## 2023-06-02 MED ORDER — OXYCODONE HCL 5 MG PO TABS
5.0000 mg | ORAL_TABLET | Freq: Three times a day (TID) | ORAL | Status: DC | PRN
  Administered 2023-06-02: 5 mg via ORAL
  Filled 2023-06-02: qty 1

## 2023-06-02 MED ORDER — DIPHENHYDRAMINE HCL 25 MG PO CAPS: 25.0000 mg | ORAL_CAPSULE | Freq: Four times a day (QID) | ORAL | Status: DC | PRN

## 2023-06-02 MED ORDER — DIPHENHYDRAMINE HCL 50 MG/ML IJ SOLN
25.0000 mg | Freq: Once | INTRAMUSCULAR | Status: AC
  Administered 2023-06-02: 25 mg via INTRAVENOUS
  Filled 2023-06-02: qty 1

## 2023-06-02 MED ORDER — EPINEPHRINE HCL 5 MG/250ML IV SOLN IN NS: 0.5000 ug/min | INTRAVENOUS | Status: DC

## 2023-06-02 MED ORDER — SERTRALINE HCL 100 MG PO TABS
100.0000 mg | ORAL_TABLET | Freq: Every day | ORAL | Status: DC
  Administered 2023-06-02 – 2023-06-03 (×2): 100 mg via ORAL
  Filled 2023-06-02: qty 2
  Filled 2023-06-02: qty 1

## 2023-06-02 MED ORDER — HYDROXYZINE HCL 25 MG PO TABS
50.0000 mg | ORAL_TABLET | Freq: Two times a day (BID) | ORAL | Status: DC
  Administered 2023-06-02 – 2023-06-03 (×4): 50 mg via ORAL
  Filled 2023-06-02 (×4): qty 2

## 2023-06-02 MED ORDER — POLYETHYLENE GLYCOL 3350 17 G PO PACK: 17.0000 g | PACK | Freq: Every day | ORAL | Status: DC | PRN

## 2023-06-02 MED ORDER — ACETAMINOPHEN 325 MG PO TABS: 650.0000 mg | ORAL_TABLET | ORAL | Status: DC | PRN

## 2023-06-02 MED ORDER — GABAPENTIN 400 MG PO CAPS
400.0000 mg | ORAL_CAPSULE | Freq: Three times a day (TID) | ORAL | Status: DC
  Administered 2023-06-02 – 2023-06-03 (×5): 400 mg via ORAL
  Filled 2023-06-02: qty 1
  Filled 2023-06-02: qty 4
  Filled 2023-06-02: qty 1
  Filled 2023-06-02: qty 4
  Filled 2023-06-02 (×3): qty 1

## 2023-06-02 MED ORDER — FAMOTIDINE 20 MG PO TABS
40.0000 mg | ORAL_TABLET | Freq: Two times a day (BID) | ORAL | Status: DC
  Administered 2023-06-02 – 2023-06-03 (×3): 40 mg via ORAL
  Filled 2023-06-02 (×3): qty 2

## 2023-06-02 MED ORDER — DIPHENHYDRAMINE HCL 50 MG/ML IJ SOLN
50.0000 mg | Freq: Four times a day (QID) | INTRAMUSCULAR | Status: DC
  Administered 2023-06-02: 50 mg via INTRAVENOUS
  Filled 2023-06-02: qty 1

## 2023-06-02 MED ORDER — OXYCODONE HCL ER 15 MG PO T12A
40.0000 mg | EXTENDED_RELEASE_TABLET | Freq: Two times a day (BID) | ORAL | Status: DC
  Administered 2023-06-02 – 2023-06-03 (×4): 40 mg via ORAL
  Filled 2023-06-02: qty 1
  Filled 2023-06-02 (×2): qty 4
  Filled 2023-06-02: qty 1

## 2023-06-02 MED ORDER — SODIUM CHLORIDE 0.9 % IV SOLN: INTRAVENOUS | Status: DC

## 2023-06-02 MED ORDER — DOCUSATE SODIUM 100 MG PO CAPS
100.0000 mg | ORAL_CAPSULE | Freq: Two times a day (BID) | ORAL | Status: DC | PRN
  Administered 2023-06-02 (×2): 100 mg via ORAL
  Filled 2023-06-02 (×2): qty 1

## 2023-06-02 MED ORDER — SODIUM CHLORIDE 0.9 % IV BOLUS
1000.0000 mL | Freq: Once | INTRAVENOUS | Status: AC
  Administered 2023-06-02: 1000 mL via INTRAVENOUS

## 2023-06-02 NOTE — H&P (Addendum)
NAME:  Jaurice Schieffer, MRN:  [id number], DOB:  [birth year], LOS: 0 ADMISSION DATE:  06/02/2023, CONSULTATION DATE:  8/12 REFERRING MD:  Dr. Manus Gunning, CHIEF COMPLAINT:  anaphylactic shock   History of Present Illness:  Patient is a 47 yo M w/ pertinent PMH renal cell CA, anxiety, deperession, tobacco abuse, chronic pain syndrome presents to [facility] ED on 8/12 w/ anaphylaxis from bee sting.  Patient recently admitted to [facility] on 7/17 with similar incident of bee sting causing anaphylactic shock on epi drip.  Patient required intubation by ENT for angioedema. Patient was extubated and discharged on 7/21 w/ epi pen.  On 8/12, patient stung by hornet and developed difficulty breathing, chest pain, tongue swelling, rash, and wheezing. EMS called and gave 3 IM epi, benadryl, solumedrol, and duonebs. Transferred to [facility]. On arrival to [facility] patient tremulous and shaky. No stridor and normal respirations. Tongue and swelling appears to be improved. Patient given pepcid and started on epi drip. PCCM consulted for icu admission and airway monitoring.  Pertinent  Medical History   Past Medical History:  Diagnosis Date   Anxiety and depression 08/01/2013   Cancer (HCC)    Cellulitis    left leg and stomach   Chicken pox    Chronic pain    Diarrhea 08/01/2013   History of kidney cancer    Hx of vasectomy    Hypertension    Renal cell carcinoma (HCC) 06/01/2013     Significant Hospital Events: Including procedures, antibiotic start and stop dates in addition to other pertinent events   8/12 bee sting anaphylaxis on epi drip  Interim History / Subjective:  Patient off epi BP stable Patient jittery and states having lots of anxiety and pain States his breathing is still labored and feels like his tongue is still slightly swollen  Objective   Blood pressure 103/64, pulse (!) 119, resp. rate 16, height 6\' 2"  (1.88 m), weight (!) 149.5 kg, SpO2 95%.       No intake or output data in the 24 hours ending  06/02/23 0046 Filed Weights   06/02/23 0009  Weight: (!) 149.5 kg    Examination: General: ill appearing male in NAD HEENT: MM pink/moist; [place]im[place] in place; no acute swelling appreciated on tongue or lips Neuro: Aox3; MAE CV: s1s2, tachy 120s, no m/r/g PULM:  dim BS bilaterally; Foreman GI: soft, bsx4 active  Extremities: warm/dry, no edema  Skin: no rashes or lesions   Resolved Hospital Problem list     Assessment & Plan:   Anaphylaxis Hx of prior tracheostomy Plan: -admit to icu -close airway monitoring -cont to hold epi drip for now as breathing is improved; if patient having worsening swelling or sob will likely resume -ENT consulted  -h1/h2 blockers -scheduled iv steroids -prn duoneb for wheezing  Anxiety/depression Plan: -resume home xanax, hydroxyzine, seroquel, zoloft  Chronic pain Plan: -resume home oxy, gabapentin, skelaxin,   Tobacco abuse Plan: -nictoine patch -cessation counseling  HTN Plan: -hold metoprolol given soft bp  Best Practice (right click and "Reselect all SmartList Selections" daily)   Diet/type: NPO w/ oral meds DVT prophylaxis: prophylactic heparin  GI prophylaxis: H2B Lines: N/A Foley:  N/A Code Status:  full code Last date of multidisciplinary goals of care discussion [8/12 patient updated at bedside]  Labs   CBC: Recent Labs  Lab 05/27/23 0816  HGB 12.6*  HCT 37.0*    Basic Metabolic Panel: Recent Labs  Lab 05/27/23 0816  NA 136  K 4.1  CL  100  GLUCOSE 163*  BUN 12  CREATININE 0.90   GFR: Estimated Creatinine Clearance: 154.9 mL/min (by C-G formula based on SCr of 0.9 mg/dL). No results for input(s): "PROCALCITON", "WBC", "LATICACIDVEN" in the last 168 hours.  Liver Function Tests: No results for input(s): "AST", "ALT", "ALKPHOS", "BILITOT", "PROT", "ALBUMIN" in the last 168 hours. No results for input(s): "LIPASE", "AMYLASE" in the last 168 hours. No results for input(s): "AMMONIA" in the last 168  hours.  ABG    Component Value Date/Time   PHART 7.47 (H) 05/10/2023 1625   PCO2ART 41 05/10/2023 1625   PO2ART 75 (L) 05/10/2023 1625   HCO3 29.8 (H) 05/10/2023 1625   TCO2 27 05/27/2023 0816   ACIDBASEDEF 8.0 (H) 05/07/2023 1613   O2SAT 97.9 05/10/2023 1625     Coagulation Profile: No results for input(s): "INR", "PROTIME" in the last 168 hours.  Cardiac Enzymes: No results for input(s): "CKTOTAL", "CKMB", "CKMBINDEX", "TROPONINI" in the last 168 hours.  HbA1C: Hgb A1c MFr Bld  Date/Time Value Ref Range Status  05/08/2023 07:01 AM 6.5 (H) 4.8 - 5.6 % Final    Comment:    (NOTE)         Prediabetes: 5.7 - 6.4         Diabetes: >6.4         Glycemic control for adults with diabetes: <7.0   03/07/2015 09:05 AM 5.4 4.6 - 6.5 % Final    Comment:    Glycemic Control Guidelines for People with Diabetes:Non Diabetic:  <6%Goal of Therapy: <7%Additional Action Suggested:  >8%     CBG: No results for input(s): "GLUCAP" in the last 168 hours.  Review of Systems:   Review of Systems  Constitutional:  Negative for fever.  Respiratory:  Positive for shortness of breath and wheezing.   Cardiovascular:  Positive for chest pain and palpitations.  Gastrointestinal:  Negative for nausea and vomiting.  Skin:  Positive for itching and rash.     Past Medical History:  He,  has a past medical history of Anxiety and depression (08/01/2013), Cancer (HCC), Cellulitis, Chicken pox, Chronic pain, Diarrhea (08/01/2013), History of kidney cancer, vasectomy, Hypertension, and Renal cell carcinoma (HCC) (06/01/2013).   Surgical History:   Past Surgical History:  Procedure Laterality Date   COLONOSCOPY WITH PROPOFOL N/A 08/19/2013   Procedure: COLONOSCOPY WITH PROPOFOL;  Surgeon: Rachael Fee, MD;  Location: [facility];  Service: Endoscopy;  Laterality: N/A;   ESOPHAGOGASTRODUODENOSCOPY (EGD) WITH PROPOFOL N/A 08/19/2013   Procedure: ESOPHAGOGASTRODUODENOSCOPY (EGD) WITH PROPOFOL;   Surgeon: Rachael Fee, MD;  [facility]: [facility];  Service: Endoscopy;  Laterality: N/A;   INTUBATION-ENDOTRACHEAL WITH TRACHEOSTOMY STANDBY N/A 05/07/2023   Procedure: INTUBATION-ENDOTRACHEAL WITH TRACHEOSTOMY STANDBY;  Surgeon: Diamantina Monks, MD;  Location: [facility] OR;  Service: General;  Laterality: N/A;   IR RADIOLOGIST EVAL & MGMT  05/20/2019   KNEE ARTHROSCOPY Left 07/13/2020   Procedure: left knee arthroscopy, debridement, cyst decompression;  Surgeon: Cammy Copa, MD;  Location: [facility];  Service: Orthopedics;  Laterality: Left;   ORIF MANDIBULAR FRACTURE N/A 02/16/2016   Procedure: OPEN REDUCTION INTERNAL FIXATION (ORIF) MANDIBULAR FRACTURE;  Surgeon: Suzanna Obey, MD;  Location: [facility] OR;  Service: ENT;  Laterality: N/A;   RIB PLATING Left 02/20/2016   Procedure: LEFT RIB PLATING;  Surgeon: Kerin Perna, MD;  Location: [facility] OR;  Service: Thoracic;  Laterality: Left;   ROBOTIC ASSITED PARTIAL NEPHRECTOMY Left 06/17/2013   Procedure: ROBOTIC ASSITED PARTIAL NEPHRECTOMY;  Surgeon: Crecencio Mc, MD;  [facility]: [facility];  Service: Urology;  Laterality: Left;   TRACHEOSTOMY TUBE PLACEMENT N/A 02/16/2016   Procedure: TRACHEOSTOMY;  Surgeon: Suzanna Obey, MD;  Location: [facility] OR;  Service: ENT;  Laterality: N/A;   VASECTOMY  2012   WISDOM TOOTH EXTRACTION  middle school   WRIST SURGERY Left middle school   "arteries and nerves tangled up"     Social History:   reports that he has been smoking cigarettes. He has a 24 pack-year smoking history. He has never used smokeless tobacco. He reports that he does not currently use alcohol. He reports that he does not use drugs.   Family History:  His family history includes Alcohol abuse in his mother; Asthma in his father and son; Cirrhosis in his father; Colitis in his father; Colon polyps in his sister; Diabetes in his maternal grandfather; Heart attack in an other family member; Heart disease in his father; Other in his brother, brother,  and father; Prostate cancer in his paternal grandfather; Stroke in an other family member.   Allergies Allergies  Allergen Reactions   Bee Venom Shortness Of Breath and Swelling    Arm swells   Hornet Venom Anaphylaxis, Shortness Of Breath and Swelling    Arm swells     Home Medications  Prior to Admission medications   Medication Sig Start Date End Date Taking? Authorizing Provider  acetaminophen (TYLENOL) 500 MG tablet Take 1,000 mg by mouth every 6 (six) hours as needed for moderate pain.    [provider]  albuterol (VENTOLIN HFA) 108 (90 Base) MCG/ACT inhaler Inhale 1-2 puffs into the lungs every 6 (six) hours as needed for wheezing or shortness of breath. 04/10/23   Kozlow, Alvira Philips, MD  ALPRAZolam Prudy Feeler) 0.5 MG tablet Take 1 tablet (0.5 mg total) by mouth 2 (two) times daily as needed for anxiety. 05/11/23   Rai, Delene Ruffini, MD  ascorbic acid (VITAMIN C) 1000 MG tablet Take by mouth. 04/20/20   [provider]  cephALEXin (KEFLEX) 500 MG capsule Take 500 mg by mouth 2 (two) times daily. 05/15/23   [provider]  cetirizine (ZYRTEC) 10 MG tablet 2 tablets 2 times per day 04/10/23   Jessica Priest, MD  EPINEPHrine 0.3 mg/0.3 mL IJ SOAJ injection Inject 0.3 mg into the muscle as needed for anaphylaxis. As needed for life-threatening allergic reactions 05/11/23   Rai, Delene Ruffini, MD  famotidine (PEPCID) 40 MG tablet Take 1 tablet (40 mg total) by mouth 2 (two) times daily. 04/10/23   Kozlow, Alvira Philips, MD  fluticasone (FLONASE) 50 MCG/ACT nasal spray Place 1 spray into both nostrils at bedtime. 05/23/20   [provider]  gabapentin (NEURONTIN) 300 MG capsule TAKE 3 CAPSULES IN THE MORNING, 3 CAPSULES AT LUNCH, AND 3 CAPSULES ARE BEDTIME 02/05/23   Lovorn, Aundra Millet, MD  hydrOXYzine (ATARAX) 50 MG tablet Take 50 mg by mouth 2 (two) times daily. 09/06/22   [provider]  lidocaine (LIDODERM) 5 % PLACE 3 PATCHES ONTO THE SKIN DAILY. REMOVE & DISCARD PATCH  WITHIN 12 HOURS OR AS DIRECTED BY MD 12/16/22   Erick Colace, MD  meclizine (ANTIVERT) 25 MG tablet Take 25 mg by mouth daily as needed for dizziness or nausea. 04/25/23   [provider]  metaxalone (SKELAXIN) 800 MG tablet TAKE 1 TABLET (800 MG TOTAL) BY MOUTH 3 (THREE) TIMES DAILY. AS NEEDED 01/20/23   Lovorn, Aundra Millet, MD  metoprolol succinate (TOPROL-XL) 100 MG 24  hr tablet Take 100 mg by mouth daily. 03/07/21   [provider]  montelukast (SINGULAIR) 10 MG tablet Take 1 tablet (10 mg total) by mouth at bedtime. 05/11/23   Rai, Ripudeep Kirtland Bouchard, MD  omeprazole (PRILOSEC) 20 MG capsule TAKE 1 CAPSULE BY MOUTH EVERY DAY 02/05/23   Lovorn, Aundra Millet, MD  oxyCODONE (ROXICODONE) 5 MG immediate release tablet Take 1 tablet (5 mg total) by mouth 3 (three) times daily as needed for breakthrough pain. 04/17/23   Jones Bales, NP  oxyCODONE ER [facility] ER) 36 MG C12A Take 1 capsule (36 mg total) by mouth every 12 (twelve) hours. For chronic pain 04/17/23   Jones Bales, NP  phentermine (ADIPEX-P) 37.5 MG tablet Take 18.75 mg by mouth every morning. 08/19/22   [provider]  predniSONE (DELTASONE) 10 MG tablet Prednisone dosing: Take  Prednisone 40mg  (4 tabs) x 3 days, then taper to 30mg  (3 tabs) x 3 days, then 20mg  (2 tabs) x 3days, then 10mg  (1 tab) x 3days, then off. 05/11/23   Rai, Ripudeep K, MD  promethazine (PHENERGAN) 25 MG tablet Take 25 mg by mouth every 6 (six) hours as needed for nausea or vomiting.    [provider]  QUEtiapine (SEROQUEL XR) 50 MG TB24 24 hr tablet Take 50 mg by mouth. 04/20/20   [provider]  sertraline (ZOLOFT) 100 MG tablet Take 100 mg by mouth daily. 06/25/22   [provider]  sildenafil (REVATIO) 20 MG tablet Take 20-100 mg by mouth See admin instructions. As needed alone or take 2-3 tablets by mouth as needed with tadalafil daily. 05/23/23   [provider]  SUMAtriptan (IMITREX) 25 MG tablet Take 25 mg by mouth daily  as needed for migraine or headache. 04/28/23   [provider]  tadalafil (CIALIS) 5 MG tablet Take 5 mg by mouth daily as needed for erectile dysfunction.    [provider]  triamcinolone cream (KENALOG) 0.1 % Apply 1 Application topically 2 (two) times daily. 04/04/23   [provider]     Critical care time: 45 minutes     JD Anselm Lis [facility] 06/02/2023, 1:40 AM  Please see [email or web address] for pager details.  From 7A-7P if no response, please call [phone]. After hours, please call ELink [phone].

## 2023-06-02 NOTE — Op Note (Signed)
ENT CONSULT:  Reason for Consult: Rule out laryngeal edema after beesting  Referring Physician:  Dr. Manus Gunning  HPI: [name] is an 47 y.o. male with a history of tracheostomy and ORIF Mandible fractures 02/16/16 (Dr. 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 Given epi in field.   No stridor reported by ED or critical care team, however ENT has been requested for urgent consultation to "rule out laryngeal edema, in the setting of which the patient will have an epinephrine drip started". He has not required supplemental o2 in the ed.  Patient reports he is breathing comfortably right now. Son at bedside.    Past Medical History:  Diagnosis Date   Anxiety and depression 08/01/2013   Cancer (HCC)    Cellulitis    left leg and stomach   Chicken pox    Chronic pain    Diarrhea 08/01/2013   History of kidney cancer    Hx of vasectomy    Hypertension    Renal cell carcinoma (HCC) 06/01/2013    Past Surgical History:  Procedure Laterality Date   COLONOSCOPY WITH PROPOFOL N/A 08/19/2013   Procedure: COLONOSCOPY WITH PROPOFOL;  Surgeon: Rachael Fee, MD;  [facility];  Service: Endoscopy;  Laterality: N/A;   ESOPHAGOGASTRODUODENOSCOPY (EGD) WITH PROPOFOL N/A 08/19/2013   Procedure: ESOPHAGOGASTRODUODENOSCOPY (EGD) WITH PROPOFOL;  Surgeon: Rachael Fee, MD;  [facility];  Service: Endoscopy;  Laterality: N/A;   INTUBATION-ENDOTRACHEAL WITH TRACHEOSTOMY STANDBY N/A 05/07/2023   Procedure: INTUBATION-ENDOTRACHEAL WITH TRACHEOSTOMY STANDBY;  Surgeon: Diamantina Monks, MD;  Location: [facility] OR;  Service: General;  Laterality: N/A;   IR RADIOLOGIST EVAL & MGMT  05/20/2019   KNEE ARTHROSCOPY Left 07/13/2020   Procedure: left knee arthroscopy, debridement, cyst decompression;  Surgeon: Cammy Copa, MD;  Location: [facility];   Service: Orthopedics;  Laterality: Left;   ORIF MANDIBULAR FRACTURE N/A 02/16/2016   Procedure: OPEN REDUCTION INTERNAL FIXATION (ORIF) MANDIBULAR FRACTURE;  Surgeon: Suzanna Obey, MD;  Location: [facility] OR;  Service: ENT;  Laterality: N/A;   RIB PLATING Left 02/20/2016   Procedure: LEFT RIB PLATING;  Surgeon: Kerin Perna, MD;  Location: [facility] OR;  Service: Thoracic;  Laterality: Left;   ROBOTIC ASSITED PARTIAL NEPHRECTOMY Left 06/17/2013   Procedure: ROBOTIC ASSITED PARTIAL NEPHRECTOMY;  Surgeon: Crecencio Mc, MD;  [facility];  Service: Urology;  Laterality: Left;   TRACHEOSTOMY TUBE PLACEMENT N/A 02/16/2016   Procedure: TRACHEOSTOMY;  Surgeon: Suzanna Obey, MD;  [facility] OR;  Service: ENT;  Laterality: N/A;   VASECTOMY  2012   WISDOM TOOTH EXTRACTION  middle school   WRIST SURGERY Left middle school   "arteries and nerves tangled up"    Family History  Problem Relation Age of Onset   Alcohol abuse Mother    Cirrhosis Father    Colitis Father    Heart disease Father    Asthma Father    Other Father        Chemical Imbalance   Colon polyps Sister        intestinal problems   Other Brother        Intestinal Fissure   Other Brother        Chemical Imbalance   Diabetes Maternal Grandfather    Prostate cancer Paternal Grandfather    Asthma Son    Heart attack Other  Paternal Grandparents   Stroke Other        Paternal Grandparents    Social History:  reports that he has been smoking cigarettes. He has a 24 pack-year smoking history. He has never used smokeless tobacco. He reports that he does not currently use alcohol. He reports that he does not use drugs.  Allergies:  Allergies  Allergen Reactions   Bee Venom Shortness Of Breath and Swelling    Arm swells   Hornet Venom Anaphylaxis, Shortness Of Breath and Swelling    Arm swells    Medications: I have reviewed the patient's current medications.  Results for orders placed or performed during the hospital encounter of  06/02/23 (from the past 48 hour(s))  CBC with Differential     Status: Abnormal   Collection Time: 06/02/23 12:10 AM  Result Value Ref Range   WBC 9.3 4.0 - 10.5 K/uL   RBC 4.33 4.22 - 5.81 MIL/uL   Hemoglobin 13.3 13.0 - 17.0 g/dL   HCT 46.9 62.9 - 52.8 %   MCV 92.6 80.0 - 100.0 fL   MCH 30.7 26.0 - 34.0 pg   MCHC 33.2 30.0 - 36.0 g/dL   RDW 41.3 24.4 - 01.0 %   Platelets 279 150 - 400 K/uL   nRBC 0.0 0.0 - 0.2 %   Neutrophils Relative % 49 %   Neutro Abs 4.6 1.7 - 7.7 K/uL   Lymphocytes Relative 33 %   Lymphs Abs 3.1 0.7 - 4.0 K/uL   Monocytes Relative 8 %   Monocytes Absolute 0.7 0.1 - 1.0 K/uL   Eosinophils Relative 9 %   Eosinophils Absolute 0.8 (H) 0.0 - 0.5 K/uL   Basophils Relative 0 %   Basophils Absolute 0.0 0.0 - 0.1 K/uL   Immature Granulocytes 1 %   Abs Immature Granulocytes 0.06 0.00 - 0.07 K/uL    Comment: Performed at [facility], [address], [place], [place] [address]  Comprehensive metabolic panel     Status: Abnormal   Collection Time: 06/02/23 12:10 AM  Result Value Ref Range   Sodium 139 135 - 145 mmol/L   Potassium 4.3 3.5 - 5.1 mmol/L   Chloride 102 98 - 111 mmol/L   CO2 23 22 - 32 mmol/L   Glucose, Bld 158 (H) 70 - 99 mg/dL    Comment: Glucose reference range applies only to samples taken after fasting for at least 8 hours.   BUN 18 6 - 20 mg/dL   Creatinine, Ser 6.64 0.61 - 1.24 mg/dL   Calcium 8.9 8.9 - 40.3 mg/dL   Total Protein 6.6 6.5 - 8.1 g/dL   Albumin 3.6 3.5 - 5.0 g/dL   AST 23 15 - 41 U/L   ALT 22 0 - 44 U/L   Alkaline Phosphatase 69 38 - 126 U/L   Total Bilirubin 0.3 0.3 - 1.2 mg/dL   GFR, Estimated >47 >42 mL/min    Comment: (NOTE) Calculated using the CKD-EPI Creatinine Equation (2021)    Anion gap 14 5 - 15    Comment: Performed at [facility], [address], [place], [place] [address]  Troponin I (High Sensitivity)     Status: None   Collection Time: 06/02/23 12:10 AM  Result Value Ref Range   Troponin I  (High Sensitivity) 4 <18 ng/L    Comment: (NOTE) Elevated high sensitivity troponin I (hsTnI) values and significant  changes across serial measurements may suggest ACS but many other  chronic  and acute conditions are known to elevate hsTnI results.  Refer to the "Links" section for chest pain algorithms and additional  guidance. Performed at [facility], [address], [place], [place] [address]   Brain natriuretic peptide     Status: None   Collection Time: 06/02/23 12:10 AM  Result Value Ref Range   B Natriuretic Peptide 18.8 0.0 - 100.0 pg/mL    Comment: Performed at [facility], [address], [place], [place] [address]  I-stat chem 8, ED (not at [facility], [facility] or [facility])     Status: Abnormal   Collection Time: 06/02/23 12:37 AM  Result Value Ref Range   Sodium 139 135 - 145 mmol/L   Potassium 4.2 3.5 - 5.1 mmol/L   Chloride 105 98 - 111 mmol/L   BUN 23 (H) 6 - 20 mg/dL   Creatinine, Ser 0.98 0.61 - 1.24 mg/dL   Glucose, Bld 119 (H) 70 - 99 mg/dL    Comment: Glucose reference range applies only to samples taken after fasting for at least 8 hours.   Calcium, Ion 1.14 (L) 1.15 - 1.40 mmol/L   TCO2 26 22 - 32 mmol/L   Hemoglobin 13.6 13.0 - 17.0 g/dL   HCT 14.7 82.9 - 56.2 %    DG Chest Portable 1 View  Result Date: 06/02/2023 CLINICAL DATA:  Shortness of breath EXAM: PORTABLE CHEST 1 VIEW COMPARISON:  05/10/2023 FINDINGS: Cardiac shadow is enlarged but stable. Lungs are well aerated bilaterally. No focal infiltrate or effusion is seen. Postsurgical changes in the left ribcage are seen. No focal infiltrate is seen. Fractured sideplate is noted along the left seventh rib. This appears chronic. IMPRESSION: No acute abnormality noted. Electronically Signed   By: Alcide Clever M.D.   On: 06/02/2023 00:37    ZHY:QMVHQION other than stated per HPI  Blood pressure (!) 96/59, pulse (!) 114, resp. rate 14, height 6\' 2"  (1.88 m), weight (!) 149.5 kg, SpO2 92%.  PHYSICAL  EXAM:  CONSTITUTIONAL: well developed, nourished, no distress and alert and oriented x 3. NO STRIDOR OR INCREASED WORK OF BREATHING.  EYES: PERRL, EOMI  HENT: Head : normocephalic and atraumatic Ears: Right ear: External ears normal.  Nose: nose normal and no purulence Mouth/Throat:  Mouth: uvula midline and no oral lesions. No edema.  Throat: oropharynx clear and moist. No edema.  NECK: supple, trachea normal and no thyromegaly or cervical LAD. Prior tracheostomy.  NEURO: CN II-XII symmetric intact   Studies Reviewed:None  Preop diagnosis: Anaphylaxis  Postop diagnosis: same Procedure: Transnasal fiberoptic laryngoscopy Surgeon:  Anesth:none Compl: None Findings: Normal laryngoscopy without any acute findings of laryngeal angioedema. Mild Polypoid corditis (c/w Dr. Jenne Pane prior note 05/11/23):   Procedure Note: 31575(Flex lary)  Informed verbal consent was obtained after explaining the risks (including bleeding and infection), benefits and alternatives of the procedure. Verbal timeout was performed prior to the procedure. The nose was topicalized with topical lidocaine/oxymetazoline. The flexible laryngo scope was advanced through the right nasal cavity. The septum and turbinates appeared normal. The middle meatus was free of polyps of purulence. The eustachian tube, choana, and adenoids were normal in appearance. The hypopharynx, arytenoids, false vocal folds, and true vocal folds appeared normal. The visualized portion of the subglottis appeared normal. The patient tolerated the procedure with no immediate complications.    Assessment/Plan: 47 y/o M with history of tracheostomy/ORIF Mandible fractures in 2017, with recent intubation in the OR 1 month ago for anaphylaxis now with  a normal laryngoscopy - no endoscopic or exam evidence of angioedema.  Recommendations: - No impending airway intervention anticipate given the patient's current clinical exam and laryngoscopy  Dispo:  Medicine    Electronically signed by:  Scarlette Ar, MD  Staff Physician Facial Plastic & Reconstructive Surgery Otolaryngology - Head and Neck Surgery [facility]   06/02/2023, 2:15 AM    ADDENDUM:  This note was accidentally classified as a operative report when in fact it was a consult note.   Electronically signed by:  Scarlette Ar, MD  Staff Physician Facial Plastic & Reconstructive Surgery Otolaryngology - Head and Neck Surgery [facility] - [place]
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
## 2023-06-02 NOTE — Progress Notes (Addendum)
eLink Physician-Brief Progress Note Patient Name: [name] DOB: Oct 24, 1975 MRN: [id number]   Date of Service  06/02/2023  HPI/Events of Note  71M with hx of tracheostomy and ORIF and recent anaphylaxis after bee-sting on 7/17 who presents for bee sting associated with difficulty breathing and tongue swelling. EMS gave IM epi x 3, Benadryl and Solu-Medrol. In the ED he received racemic epinephrine nebs.   On camera, patient on room air. Not on epi gtt  eICU Interventions  Continue steroids, H2 blockers Monitor airway   4:15 AM: Patient requesting diet. Clinically improved since arrival to ICU. Will slowly advance to CLD first  Intervention Category Evaluation Type: New Patient Evaluation   Mechele Collin 06/02/2023, 3:04 AM

## 2023-06-02 NOTE — ED Provider Notes (Signed)
[place] EMERGENCY DEPARTMENT AT [facility] Provider Note   CSN: [id number] Arrival date & time: 06/02/23  0006     History  Chief Complaint  Patient presents with   Allergic Reaction    [name] is a 47 y.o. male.  Level 5 caveat for acuity of condition.  Patient brought in by EMS with anaphylaxis status post yellowjacket sting approximately 1 hour ago.  States he was stung to his left shoulder by yellowjacket immediately developed difficulty breathing, chest pain, tongue swelling, lip swelling rashing and wheezing.  EMS gave him a total of 3 IM EpiPen's, 50 mg of Benadryl, 2 DuoNeb's, 125 Solu-Medrol.  They did give 1 racemic epinephrine as well.  Patient had a similar presentation about a month ago that ultimately required intubation in the OR for throat swelling.  He developed chest pain after receiving his medications and shortness of breath.  He denies any abdominal pain, nausea or vomiting.  No cough or fever.  No rash.  No leg pain or leg swelling.  This is similar to what happened last month but states it is less severe in his throat.  He is tremulous and shaky on arrival.  States he has not had his Xanax for the past 3 days.  Normally take 0.5 mg twice daily.  He is also on chronic pain medication  The history is provided by the patient.  Allergic Reaction      Home Medications Prior to Admission medications   Medication Sig Start Date End Date Taking? Authorizing Provider  acetaminophen (TYLENOL) 500 MG tablet Take 1,000 mg by mouth every 6 (six) hours as needed for moderate pain.    [provider]  albuterol (VENTOLIN HFA) 108 (90 Base) MCG/ACT inhaler Inhale 1-2 puffs into the lungs every 6 (six) hours as needed for wheezing or shortness of breath. 04/10/23   Kozlow, Alvira Philips, MD  ALPRAZolam Prudy Feeler) 0.5 MG tablet Take 1 tablet (0.5 mg total) by mouth 2 (two) times daily as needed for anxiety. 05/11/23   Rai, Delene Ruffini, MD  ascorbic acid  (VITAMIN C) 1000 MG tablet Take by mouth. 04/20/20   [provider]  cephALEXin (KEFLEX) 500 MG capsule Take 500 mg by mouth 2 (two) times daily. 05/15/23   [provider]  cetirizine (ZYRTEC) 10 MG tablet 2 tablets 2 times per day 04/10/23   Jessica Priest, MD  EPINEPHrine 0.3 mg/0.3 mL IJ SOAJ injection Inject 0.3 mg into the muscle as needed for anaphylaxis. As needed for life-threatening allergic reactions 05/11/23   Rai, Delene Ruffini, MD  famotidine (PEPCID) 40 MG tablet Take 1 tablet (40 mg total) by mouth 2 (two) times daily. 04/10/23   Kozlow, Alvira Philips, MD  fluticasone (FLONASE) 50 MCG/ACT nasal spray Place 1 spray into both nostrils at bedtime. 05/23/20   [provider]  gabapentin (NEURONTIN) 300 MG capsule TAKE 3 CAPSULES IN THE MORNING, 3 CAPSULES AT LUNCH, AND 3 CAPSULES ARE BEDTIME 02/05/23   Lovorn, Aundra Millet, MD  hydrOXYzine (ATARAX) 50 MG tablet Take 50 mg by mouth 2 (two) times daily. 09/06/22   [provider]  lidocaine (LIDODERM) 5 % PLACE 3 PATCHES ONTO THE SKIN DAILY. REMOVE & DISCARD PATCH WITHIN 12 HOURS OR AS DIRECTED BY MD 12/16/22   Erick Colace, MD  meclizine (ANTIVERT) 25 MG tablet Take 25 mg by mouth daily as needed for dizziness or nausea. 04/25/23   [provider]  metaxalone (SKELAXIN) 800 MG tablet TAKE  1 TABLET (800 MG TOTAL) BY MOUTH 3 (THREE) TIMES DAILY. AS NEEDED 01/20/23   Lovorn, Aundra Millet, MD  metoprolol succinate (TOPROL-XL) 100 MG 24 hr tablet Take 100 mg by mouth daily. 03/07/21   [provider]  montelukast (SINGULAIR) 10 MG tablet Take 1 tablet (10 mg total) by mouth at bedtime. 05/11/23   Rai, Ripudeep Kirtland Bouchard, MD  omeprazole (PRILOSEC) 20 MG capsule TAKE 1 CAPSULE BY MOUTH EVERY DAY 02/05/23   Lovorn, Aundra Millet, MD  oxyCODONE (ROXICODONE) 5 MG immediate release tablet Take 1 tablet (5 mg total) by mouth 3 (three) times daily as needed for breakthrough pain. 04/17/23   Jones Bales, NP  oxyCODONE ER [facility] ER) 36 MG C12A  Take 1 capsule (36 mg total) by mouth every 12 (twelve) hours. For chronic pain 04/17/23   Jones Bales, NP  phentermine (ADIPEX-P) 37.5 MG tablet Take 18.75 mg by mouth every morning. 08/19/22   [provider]  predniSONE (DELTASONE) 10 MG tablet Prednisone dosing: Take  Prednisone 40mg  (4 tabs) x 3 days, then taper to 30mg  (3 tabs) x 3 days, then 20mg  (2 tabs) x 3days, then 10mg  (1 tab) x 3days, then off. 05/11/23   Rai, Ripudeep K, MD  promethazine (PHENERGAN) 25 MG tablet Take 25 mg by mouth every 6 (six) hours as needed for nausea or vomiting.    [provider]  QUEtiapine (SEROQUEL XR) 50 MG TB24 24 hr tablet Take 50 mg by mouth. 04/20/20   [provider]  sertraline (ZOLOFT) 100 MG tablet Take 100 mg by mouth daily. 06/25/22   [provider]  sildenafil (REVATIO) 20 MG tablet Take 20-100 mg by mouth See admin instructions. As needed alone or take 2-3 tablets by mouth as needed with tadalafil daily. 05/23/23   [provider]  SUMAtriptan (IMITREX) 25 MG tablet Take 25 mg by mouth daily as needed for migraine or headache. 04/28/23   [provider]  tadalafil (CIALIS) 5 MG tablet Take 5 mg by mouth daily as needed for erectile dysfunction.    [provider]  triamcinolone cream (KENALOG) 0.1 % Apply 1 Application topically 2 (two) times daily. 04/04/23   [provider]      Allergies    Bee venom and Hornet venom    Review of Systems   Review of Systems  Unable to perform ROS: Acuity of condition    Physical Exam Updated Vital Signs BP 114/64   Pulse 99   Temp 97.8 F (36.6 C) (Oral)   Resp 15   Ht 6\' 2"  (1.88 m)   Wt (!) 152.1 kg   SpO2 93%   BMI 43.05 kg/m  Physical Exam Vitals and nursing note reviewed.  Constitutional:      General: He is not in acute distress.    Appearance: He is well-developed.     Comments: Anxious, speaking short sentences, no appreciable stridor  HENT:     Head: Normocephalic  and atraumatic.     Mouth/Throat:     Pharynx: No oropharyngeal exudate.     Comments: Uvula is midline, tongue appears normal.  No obvious swelling seen of posterior pharynx. Eyes:     Conjunctiva/sclera: Conjunctivae normal.     Pupils: Pupils are equal, round, and reactive to light.  Neck:     Comments: No meningismus. Cardiovascular:     Rate and Rhythm: Normal rate and regular rhythm.     Heart sounds: Normal heart sounds. No murmur heard. Pulmonary:  Effort: Pulmonary effort is normal. No respiratory distress.     Breath sounds: Normal breath sounds. No stridor. No wheezing.  Abdominal:     Palpations: Abdomen is soft.     Tenderness: There is no abdominal tenderness. There is no guarding or rebound.  Musculoskeletal:        General: No tenderness. Normal range of motion.     Cervical back: Normal range of motion and neck supple.  Skin:    General: Skin is warm.     Capillary Refill: Capillary refill takes less than 2 seconds.     Findings: No rash.  Neurological:     General: No focal deficit present.     Mental Status: He is alert and oriented to person, place, and time. Mental status is at baseline.     Cranial Nerves: No cranial nerve deficit.     Motor: No abnormal muscle tone.     Coordination: Coordination normal.     Comments:  5/5 strength throughout. CN 2-12 intact.Equal grip strength.   Psychiatric:        Behavior: Behavior normal.     ED Results / Procedures / Treatments   Labs (all labs ordered are listed, but only abnormal results are displayed) Labs Reviewed  CBC WITH DIFFERENTIAL/PLATELET - Abnormal; Notable for the following components:      Result Value   Eosinophils Absolute 0.8 (*)    All other components within normal limits  COMPREHENSIVE METABOLIC PANEL - Abnormal; Notable for the following components:   Glucose, Bld 158 (*)    All other components within normal limits  CBC - Abnormal; Notable for the following components:   RBC 3.50  (*)    Hemoglobin 11.0 (*)    HCT 33.8 (*)    All other components within normal limits  BASIC METABOLIC PANEL - Abnormal; Notable for the following components:   CO2 18 (*)    Glucose, Bld 200 (*)    Calcium 7.1 (*)    All other components within normal limits  GLUCOSE, CAPILLARY - Abnormal; Notable for the following components:   Glucose-Capillary 190 (*)    All other components within normal limits  I-STAT CHEM 8, ED - Abnormal; Notable for the following components:   BUN 23 (*)    Glucose, Bld 154 (*)    Calcium, Ion 1.14 (*)    All other components within normal limits  MRSA NEXT GEN BY PCR, NASAL  BRAIN NATRIURETIC PEPTIDE  MAGNESIUM  RAPID URINE DRUG SCREEN, HOSP PERFORMED  URINALYSIS, W/ REFLEX TO CULTURE (INFECTION SUSPECTED)  TROPONIN I (HIGH SENSITIVITY)  TROPONIN I (HIGH SENSITIVITY)    EKG EKG Interpretation Date/Time:  Monday June 02 2023 00:10:55 EDT Ventricular Rate:  118 PR Interval:  154 QRS Duration:  100 QT Interval:  294 QTC Calculation: 412 R Axis:   81  Text Interpretation: Sinus tachycardia Low voltage, precordial leads No significant change was found Confirmed by Glynn Octave [id number]) on 06/02/2023 12:19:29 AM  Radiology DG Chest Portable 1 View  Result Date: 06/02/2023 CLINICAL DATA:  Shortness of breath EXAM: PORTABLE CHEST 1 VIEW COMPARISON:  05/10/2023 FINDINGS: Cardiac shadow is enlarged but stable. Lungs are well aerated bilaterally. No focal infiltrate or effusion is seen. Postsurgical changes in the left ribcage are seen. No focal infiltrate is seen. Fractured sideplate is noted along the left seventh rib. This appears chronic. IMPRESSION: No acute abnormality noted. Electronically Signed   By: Alcide Clever M.D.   On: 06/02/2023 00:37  Procedures .Critical Care  Performed by: Glynn Octave, MD Authorized by: Glynn Octave, MD   Critical care provider statement:    Critical care time (minutes):  60   Critical care time was  exclusive of:  Separately billable procedures and treating other patients   Critical care was necessary to treat or prevent imminent or life-threatening deterioration of the following conditions:  Respiratory failure and shock   Critical care was time spent personally by me on the following activities:  Development of treatment plan with patient or surrogate, discussions with consultants, evaluation of patient's response to treatment, examination of patient, ordering and review of laboratory studies, ordering and review of radiographic studies, ordering and performing treatments and interventions, pulse oximetry, re-evaluation of patient's condition, review of old charts, blood draw for specimens and obtaining history from patient or surrogate   I assumed direction of critical care for this patient from another provider in my specialty: no     Care discussed with: admitting provider       Medications Ordered in ED Medications  sodium chloride 0.9 % bolus 1,000 mL (has no administration in time range)  0.9 %  sodium chloride infusion (has no administration in time range)  diphenhydrAMINE (BENADRYL) injection 25 mg (has no administration in time range)  famotidine (PEPCID) IVPB 20 mg premix (has no administration in time range)  ipratropium-albuterol (DUONEB) 0.5-2.5 (3) MG/3ML nebulizer solution 3 mL (has no administration in time range)  LORazepam (ATIVAN) injection 1 mg (has no administration in time range)    ED Course/ Medical Decision Making/ A&P                                 Medical Decision Making Amount and/or Complexity of Data Reviewed Independent Historian: EMS Labs: ordered. Decision-making details documented in ED Course. Radiology: ordered and independent interpretation performed. Decision-making details documented in ED Course. ECG/medicine tests: ordered and independent interpretation performed. Decision-making details documented in ED Course.  Risk Prescription drug  management. Decision regarding hospitalization.   Anaphylaxis secondary to wasp sting.  Patient received IM epinephrine x 3, Solu-Medrol, Benadryl. No hypoxia or increased work of breathing.   On arrival is given additional DuoNebs and IV fluids.  Blood pressure 119 systolic.  Will hold epinephrine drip at this time. No stridor.  No appreciable oral swelling.  No indication for emergent intubation currently.  Patient  blood pressure has maintained about 90 systolic.  He is speaking in full sentences.  Does feel some tongue swelling.  Lungs are clear without wheezing. He is very anxious and c/o chronic pain which is exacerbating his perceived dyspnea.   Continue antihistamines, steroids.  No further epinephrine given throughout ED course will hold epinephrine drip at this time as blood pressure is improving.  Give patient's presentation last month that required intubation, discussed with critical care who consulted at bedside.  Recommend ENT evaluation for assessment of laryngeal or pharyngeal swelling.  Discussed with Dr. Ernestene Kiel and Dr. Vassie Loll.  Dr. Ernestene Kiel has performed laryngoscopy which was normal and showed no posterior edema.   Patient very anxious has not had his benzodiazepines for several days.  He is also quite fixated in containing getting pain medications.  Suspect likely epinephrine causing his increased tremors and anxiety.  Dr. Vassie Loll will admit to ICU for airway monitoring.        Final Clinical Impression(s) / ED Diagnoses Final diagnoses:  Anaphylaxis, initial encounter  Rx / DC Orders ED Discharge Orders     None         , Jeannett Senior, MD 06/02/23 [phone]

## 2023-06-02 NOTE — ED Notes (Signed)
.ED TO INPATIENT HANDOFF REPORT  ED Nurse Name and Phone #: chris [phone]   S Name/Age/Gender [name] 47 y.o. male Room/Bed: TRAAC/TRAAC  Code Status   Code Status: Full Code  Home/SNF/Other Home Patient oriented to: self, place, time, and situation Is this baseline? Yes   Triage Complete: Triage complete  Chief Complaint Anaphylactic shock [T78.2XXA]  Triage Note The pt arrived by [facility] from home stung by a hornett on the back of his  earlier  on arrival alert   Allergies Allergies  Allergen Reactions   Bee Venom Shortness Of Breath and Swelling    Arm swells   Hornet Venom Anaphylaxis, Shortness Of Breath and Swelling    Arm swells    Level of Care/Admitting Diagnosis ED Disposition     ED Disposition  Admit   Condition  --   Comment  Hospital Area: [facility] [[id number]]  Level of Care: ICU [6]  May admit patient to [facility] or [facility] if equivalent level of care is available:: Yes  Covid Evaluation: Asymptomatic - no recent exposure (last 10 days) testing not required  Diagnosis: Anaphylactic shock [[id number]]  Admitting Physician: Oretha Milch [[id number]]  Attending Physician: Oretha Milch [[id number]]  Certification:: I certify this patient will need inpatient services for at least 2 midnights  Estimated Length of Stay: 2          B Medical/Surgery History Past Medical History:  Diagnosis Date   Anxiety and depression 08/01/2013   Cancer (HCC)    Cellulitis    left leg and stomach   Chicken pox    Chronic pain    Diarrhea 08/01/2013   History of kidney cancer    Hx of vasectomy    Hypertension    Renal cell carcinoma (HCC) 06/01/2013   Past Surgical History:  Procedure Laterality Date   COLONOSCOPY WITH PROPOFOL N/A 08/19/2013   Procedure: COLONOSCOPY WITH PROPOFOL;  Surgeon: Rachael Fee, MD;  Location: [facility];  Service: Endoscopy;  Laterality: N/A;   ESOPHAGOGASTRODUODENOSCOPY (EGD) WITH PROPOFOL N/A  08/19/2013   Procedure: ESOPHAGOGASTRODUODENOSCOPY (EGD) WITH PROPOFOL;  Surgeon: Rachael Fee, MD;  Location: [facility];  Service: Endoscopy;  Laterality: N/A;   INTUBATION-ENDOTRACHEAL WITH TRACHEOSTOMY STANDBY N/A 05/07/2023   Procedure: INTUBATION-ENDOTRACHEAL WITH TRACHEOSTOMY STANDBY;  Surgeon: Diamantina Monks, MD;  Location: [facility] OR;  Service: General;  Laterality: N/A;   IR RADIOLOGIST EVAL & MGMT  05/20/2019   KNEE ARTHROSCOPY Left 07/13/2020   Procedure: left knee arthroscopy, debridement, cyst decompression;  Surgeon: Cammy Copa, MD;  Location: [facility];  Service: Orthopedics;  Laterality: Left;   ORIF MANDIBULAR FRACTURE N/A 02/16/2016   Procedure: OPEN REDUCTION INTERNAL FIXATION (ORIF) MANDIBULAR FRACTURE;  Surgeon: Suzanna Obey, MD;  Location: [facility] OR;  Service: ENT;  Laterality: N/A;   RIB PLATING Left 02/20/2016   Procedure: LEFT RIB PLATING;  Surgeon: Kerin Perna, MD;  Location: [facility] OR;  Service: Thoracic;  Laterality: Left;   ROBOTIC ASSITED PARTIAL NEPHRECTOMY Left 06/17/2013   Procedure: ROBOTIC ASSITED PARTIAL NEPHRECTOMY;  Surgeon: Crecencio Mc, MD;  Location: [facility];  Service: Urology;  Laterality: Left;   TRACHEOSTOMY TUBE PLACEMENT N/A 02/16/2016   Procedure: TRACHEOSTOMY;  Surgeon: Suzanna Obey, MD;  Location: [facility] OR;  Service: ENT;  Laterality: N/A;   VASECTOMY  2012   WISDOM TOOTH EXTRACTION  middle school   WRIST SURGERY Left middle school   "arteries and nerves tangled up"  A IV Location/Drains/Wounds Patient Lines/Drains/Airways Status     Active Line/Drains/Airways     Name Placement date Placement time Site Days   Peripheral IV 06/02/23 20 G Right Hand 06/02/23  0013  Hand  less than 1            Intake/Output Last 24 hours No intake or output data in the 24 hours ending 06/02/23 0143  Labs/Imaging Results for orders placed or performed during the hospital encounter of 06/02/23 (from the past 48 hour(s))  CBC with  Differential     Status: Abnormal   Collection Time: 06/02/23 12:10 AM  Result Value Ref Range   WBC 9.3 4.0 - 10.5 K/uL   RBC 4.33 4.22 - 5.81 MIL/uL   Hemoglobin 13.3 13.0 - 17.0 g/dL   HCT 40.9 81.1 - 91.4 %   MCV 92.6 80.0 - 100.0 fL   MCH 30.7 26.0 - 34.0 pg   MCHC 33.2 30.0 - 36.0 g/dL   RDW 78.2 95.6 - 21.3 %   Platelets 279 150 - 400 K/uL   nRBC 0.0 0.0 - 0.2 %   Neutrophils Relative % 49 %   Neutro Abs 4.6 1.7 - 7.7 K/uL   Lymphocytes Relative 33 %   Lymphs Abs 3.1 0.7 - 4.0 K/uL   Monocytes Relative 8 %   Monocytes Absolute 0.7 0.1 - 1.0 K/uL   Eosinophils Relative 9 %   Eosinophils Absolute 0.8 (H) 0.0 - 0.5 K/uL   Basophils Relative 0 %   Basophils Absolute 0.0 0.0 - 0.1 K/uL   Immature Granulocytes 1 %   Abs Immature Granulocytes 0.06 0.00 - 0.07 K/uL    Comment: Performed at [facility], [address], [place], [place] [address]  Comprehensive metabolic panel     Status: Abnormal   Collection Time: 06/02/23 12:10 AM  Result Value Ref Range   Sodium 139 135 - 145 mmol/L   Potassium 4.3 3.5 - 5.1 mmol/L   Chloride 102 98 - 111 mmol/L   CO2 23 22 - 32 mmol/L   Glucose, Bld 158 (H) 70 - 99 mg/dL    Comment: Glucose reference range applies only to samples taken after fasting for at least 8 hours.   BUN 18 6 - 20 mg/dL   Creatinine, Ser 8.46 0.61 - 1.24 mg/dL   Calcium 8.9 8.9 - 96.2 mg/dL   Total Protein 6.6 6.5 - 8.1 g/dL   Albumin 3.6 3.5 - 5.0 g/dL   AST 23 15 - 41 U/L   ALT 22 0 - 44 U/L   Alkaline Phosphatase 69 38 - 126 U/L   Total Bilirubin 0.3 0.3 - 1.2 mg/dL   GFR, Estimated >95 >28 mL/min    Comment: (NOTE) Calculated using the CKD-EPI Creatinine Equation (2021)    Anion gap 14 5 - 15    Comment: Performed at [facility], [address] [place], [place] [address]  Troponin I (High Sensitivity)     Status: None   Collection Time: 06/02/23 12:10 AM  Result Value Ref Range   Troponin I (High Sensitivity) 4 <18 ng/L    Comment:  (NOTE) Elevated high sensitivity troponin I (hsTnI) values and significant  changes across serial measurements may suggest ACS but many other  chronic and acute conditions are known to elevate hsTnI results.  Refer to the "Links" section for chest pain algorithms and additional  guidance. Performed at [facility], [address], [place], [place] [address]  Brain natriuretic peptide     Status: None   Collection Time: 06/02/23 12:10 AM  Result Value Ref Range   B Natriuretic Peptide 18.8 0.0 - 100.0 pg/mL    Comment: Performed at [facility], [address], [place], [place] [address]  I-stat chem 8, ED (not at [facility], [facility] or [facility])     Status: Abnormal   Collection Time: 06/02/23 12:37 AM  Result Value Ref Range   Sodium 139 135 - 145 mmol/L   Potassium 4.2 3.5 - 5.1 mmol/L   Chloride 105 98 - 111 mmol/L   BUN 23 (H) 6 - 20 mg/dL   Creatinine, Ser 2.95 0.61 - 1.24 mg/dL   Glucose, Bld 621 (H) 70 - 99 mg/dL    Comment: Glucose reference range applies only to samples taken after fasting for at least 8 hours.   Calcium, Ion 1.14 (L) 1.15 - 1.40 mmol/L   TCO2 26 22 - 32 mmol/L   Hemoglobin 13.6 13.0 - 17.0 g/dL   HCT 30.8 65.7 - 84.6 %   DG Chest Portable 1 View  Result Date: 06/02/2023 CLINICAL DATA:  Shortness of breath EXAM: PORTABLE CHEST 1 VIEW COMPARISON:  05/10/2023 FINDINGS: Cardiac shadow is enlarged but stable. Lungs are well aerated bilaterally. No focal infiltrate or effusion is seen. Postsurgical changes in the left ribcage are seen. No focal infiltrate is seen. Fractured sideplate is noted along the left seventh rib. This appears chronic. IMPRESSION: No acute abnormality noted. Electronically Signed   By: Alcide Clever M.D.   On: 06/02/2023 00:37    Pending Labs Unresulted Labs (From admission, onward)     Start     Ordered   06/02/23 0500  CBC  Tomorrow morning,   R        06/02/23 0127   06/02/23 0500  Basic metabolic panel  Tomorrow morning,   R         06/02/23 0127   06/02/23 0128  Magnesium  Add-on,   AD        06/02/23 0127            Vitals/Pain Today's Vitals   06/02/23 0009 06/02/23 0010  BP:  103/64  Pulse:  (!) 119  Resp:  16  SpO2:  95%  Weight: (!) 149.5 kg   Height: 6\' 2"  (1.88 m)   PainSc: 8      Isolation Precautions No active isolations  Medications Medications  sodium chloride 0.9 % bolus 1,000 mL (has no administration in time range)  0.9 %  sodium chloride infusion ( Intravenous New Bag/Given 06/02/23 0025)  EPINEPHrine (ADRENALIN) 5 mg in NS 250 mL (0.02 mg/mL) premix infusion (has no administration in time range)  montelukast (SINGULAIR) tablet 10 mg (has no administration in time range)  famotidine (PEPCID) 40 mg in sodium chloride 0.9 % 50 mL IVPB (has no administration in time range)  ipratropium-albuterol (DUONEB) 0.5-2.5 (3) MG/3ML nebulizer solution 3 mL (has no administration in time range)  docusate sodium (COLACE) capsule 100 mg (has no administration in time range)  polyethylene glycol (MIRALAX / GLYCOLAX) packet 17 g (has no administration in time range)  heparin injection 5,000 Units (has no administration in time range)  acetaminophen (TYLENOL) tablet 650 mg (has no administration in time range)  ALPRAZolam (XANAX) tablet 0.5 mg (has no administration in time range)  loratadine (CLARITIN) tablet 10 mg (has no administration in time range)  fluticasone (FLONASE) 50 MCG/ACT nasal spray 1 spray (has no  administration in time range)  gabapentin (NEURONTIN) capsule 400 mg (has no administration in time range)  hydrOXYzine (ATARAX) tablet 50 mg (has no administration in time range)  metaxalone (SKELAXIN) tablet 800 mg (has no administration in time range)  oxyCODONE (Oxy IR/ROXICODONE) immediate release tablet 5 mg (has no administration in time range)  oxyCODONE (OXYCONTIN) 12 hr tablet 40 mg (has no administration in time range)  QUEtiapine (SEROQUEL XR) 24 hr tablet 50 mg (has no  administration in time range)  sertraline (ZOLOFT) tablet 100 mg (has no administration in time range)  nicotine (NICODERM CQ - dosed in mg/24 hours) patch 21 mg (has no administration in time range)  diphenhydrAMINE (BENADRYL) injection 25 mg (25 mg Intravenous Given 06/02/23 0031)  famotidine (PEPCID) IVPB 20 mg premix (0 mg Intravenous Stopped 06/02/23 0100)  ipratropium-albuterol (DUONEB) 0.5-2.5 (3) MG/3ML nebulizer solution 3 mL (3 mLs Nebulization Given 06/02/23 0042)  LORazepam (ATIVAN) injection 1 mg (1 mg Intravenous Given 06/02/23 0022)  fentaNYL (SUBLIMAZE) injection 50 mcg (50 mcg Intravenous Given 06/02/23 0042)  sodium chloride 0.9 % bolus 1,000 mL (1,000 mLs Intravenous New Bag/Given 06/02/23 0142)    Mobility walks     Focused Assessments Cardiac Assessment Handoff:    Lab Results  Component Value Date   CKTOTAL 72 04/09/2023   TROPONINI <0.03 04/11/2019   Lab Results  Component Value Date   DDIMER 1.27 (H) 07/28/2020   Does the Patient currently have chest pain? No    R Recommendations: See Admitting Provider Note  Report given to:   Additional Notes:

## 2023-06-02 NOTE — Plan of Care (Signed)
Pt alert and oriented x 4. Up adlib. Vitals stable. Sinus Rhythm on tele. Med compliant. Tolerating heart heatlhy diet. BS obtained ac hs due to last order ac hs. BS 149. Pt received scheduled oxycontin for back pain which is chronic. Xanax and colace given at hs from prn list. Pt continent of bowel and bladder las bm 8/10 bs positive x 4.  Problem: Education: Goal: Ability to describe self-care measures that may prevent or decrease complications (Diabetes Survival Skills Education) will improve Outcome: Progressing Goal: Individualized Educational Video(s) Outcome: Progressing   Problem: Cardiac: Goal: Ability to maintain an adequate cardiac output will improve Outcome: Progressing   Problem: Health Behavior/Discharge Planning: Goal: Ability to identify and utilize available resources and services will improve Outcome: Progressing Goal: Ability to manage health-related needs will improve Outcome: Progressing   Problem: Fluid Volume: Goal: Ability to achieve a balanced intake and output will improve Outcome: Progressing   Problem: Metabolic: Goal: Ability to maintain appropriate glucose levels will improve Outcome: Progressing   Problem: Nutritional: Goal: Maintenance of adequate nutrition will improve Outcome: Progressing Goal: Maintenance of adequate weight for body size and type will improve Outcome: Progressing   Problem: Respiratory: Goal: Will regain and/or maintain adequate ventilation Outcome: Progressing   Problem: Urinary Elimination: Goal: Ability to achieve and maintain adequate renal perfusion and functioning will improve Outcome: Progressing   Problem: Education: Goal: Knowledge of General Education information will improve Description: Including pain rating scale, medication(s)/side effects and non-pharmacologic comfort measures Outcome: Progressing   Problem: Health Behavior/Discharge Planning: Goal: Ability to manage health-related needs will  improve Outcome: Progressing   Problem: Clinical Measurements: Goal: Ability to maintain clinical measurements within normal limits will improve Outcome: Progressing Goal: Will remain free from infection Outcome: Progressing Goal: Diagnostic test results will improve Outcome: Progressing Goal: Respiratory complications will improve Outcome: Progressing Goal: Cardiovascular complication will be avoided Outcome: Progressing   Problem: Activity: Goal: Risk for activity intolerance will decrease Outcome: Progressing   Problem: Nutrition: Goal: Adequate nutrition will be maintained Outcome: Progressing   Problem: Coping: Goal: Level of anxiety will decrease Outcome: Progressing   Problem: Elimination: Goal: Will not experience complications related to bowel motility Outcome: Progressing Goal: Will not experience complications related to urinary retention Outcome: Progressing   Problem: Pain Managment: Goal: General experience of comfort will improve Outcome: Progressing   Problem: Safety: Goal: Ability to remain free from injury will improve Outcome: Progressing   Problem: Skin Integrity: Goal: Risk for impaired skin integrity will decrease Outcome: Progressing   Problem: Education: Goal: Ability to describe self-care measures that may prevent or decrease complications (Diabetes Survival Skills Education) will improve Outcome: Progressing Goal: Individualized Educational Video(s) Outcome: Progressing   Problem: Coping: Goal: Ability to adjust to condition or change in health will improve Outcome: Progressing   Problem: Fluid Volume: Goal: Ability to maintain a balanced intake and output will improve Outcome: Progressing   Problem: Health Behavior/Discharge Planning: Goal: Ability to identify and utilize available resources and services will improve Outcome: Progressing Goal: Ability to manage health-related needs will improve Outcome: Progressing   Problem:  Metabolic: Goal: Ability to maintain appropriate glucose levels will improve Outcome: Progressing   Problem: Nutritional: Goal: Maintenance of adequate nutrition will improve Outcome: Progressing Goal: Progress toward achieving an optimal weight will improve Outcome: Progressing   Problem: Skin Integrity: Goal: Risk for impaired skin integrity will decrease Outcome: Progressing   Problem: Tissue Perfusion: Goal: Adequacy of tissue perfusion will improve Outcome: Progressing

## 2023-06-02 NOTE — ED Triage Notes (Signed)
The pt arrived by [facility] from home stung by a hornett on the back of his  earlier  on arrival alert

## 2023-06-02 NOTE — ED Notes (Signed)
The pt reports that he wa stung by a red hornett ust prior to arrival here.  However I do not see a bite or sting mark at present

## 2023-06-02 NOTE — Plan of Care (Signed)
  Problem: Education: Goal: Ability to describe self-care measures that may prevent or decrease complications (Diabetes Survival Skills Education) will improve Outcome: Progressing Goal: Individualized Educational Video(s) Outcome: Progressing   Problem: Cardiac: Goal: Ability to maintain an adequate cardiac output will improve Outcome: Progressing   Problem: Health Behavior/Discharge Planning: Goal: Ability to identify and utilize available resources and services will improve Outcome: Progressing Goal: Ability to manage health-related needs will improve Outcome: Progressing   Problem: Fluid Volume: Goal: Ability to achieve a balanced intake and output will improve Outcome: Progressing   Problem: Metabolic: Goal: Ability to maintain appropriate glucose levels will improve Outcome: Progressing   Problem: Nutritional: Goal: Maintenance of adequate nutrition will improve Outcome: Progressing Goal: Maintenance of adequate weight for body size and type will improve Outcome: Progressing   Problem: Respiratory: Goal: Will regain and/or maintain adequate ventilation Outcome: Progressing   Problem: Urinary Elimination: Goal: Ability to achieve and maintain adequate renal perfusion and functioning will improve Outcome: Progressing   Problem: Education: Goal: Knowledge of General Education information will improve Description: Including pain rating scale, medication(s)/side effects and non-pharmacologic comfort measures Outcome: Progressing   Problem: Health Behavior/Discharge Planning: Goal: Ability to manage health-related needs will improve Outcome: Progressing   Problem: Clinical Measurements: Goal: Ability to maintain clinical measurements within normal limits will improve Outcome: Progressing Goal: Will remain free from infection Outcome: Progressing Goal: Diagnostic test results will improve Outcome: Progressing Goal: Respiratory complications will improve Outcome:  Progressing Goal: Cardiovascular complication will be avoided Outcome: Progressing   Problem: Activity: Goal: Risk for activity intolerance will decrease Outcome: Progressing   Problem: Nutrition: Goal: Adequate nutrition will be maintained Outcome: Progressing   Problem: Coping: Goal: Level of anxiety will decrease Outcome: Progressing   Problem: Elimination: Goal: Will not experience complications related to bowel motility Outcome: Progressing Goal: Will not experience complications related to urinary retention Outcome: Progressing   Problem: Pain Managment: Goal: General experience of comfort will improve Outcome: Progressing   Problem: Safety: Goal: Ability to remain free from injury will improve Outcome: Progressing   Problem: Skin Integrity: Goal: Risk for impaired skin integrity will decrease Outcome: Progressing   Problem: Education: Goal: Ability to describe self-care measures that may prevent or decrease complications (Diabetes Survival Skills Education) will improve Outcome: Progressing Goal: Individualized Educational Video(s) Outcome: Progressing   Problem: Coping: Goal: Ability to adjust to condition or change in health will improve Outcome: Progressing   Problem: Fluid Volume: Goal: Ability to maintain a balanced intake and output will improve Outcome: Progressing   Problem: Health Behavior/Discharge Planning: Goal: Ability to identify and utilize available resources and services will improve Outcome: Progressing Goal: Ability to manage health-related needs will improve Outcome: Progressing   Problem: Metabolic: Goal: Ability to maintain appropriate glucose levels will improve Outcome: Progressing   Problem: Nutritional: Goal: Maintenance of adequate nutrition will improve Outcome: Progressing Goal: Progress toward achieving an optimal weight will improve Outcome: Progressing   Problem: Skin Integrity: Goal: Risk for impaired skin  integrity will decrease Outcome: Progressing   Problem: Tissue Perfusion: Goal: Adequacy of tissue perfusion will improve Outcome: Progressing   

## 2023-06-03 LAB — GLUCOSE, CAPILLARY
Glucose-Capillary: 117 mg/dL — ABNORMAL HIGH (ref 70–99)
Glucose-Capillary: 178 mg/dL — ABNORMAL HIGH (ref 70–99)

## 2023-06-03 MED ORDER — ALPRAZOLAM 1 MG PO TABS: 1.0000 mg | ORAL_TABLET | Freq: Three times a day (TID) | ORAL | 0 refills | Status: DC | PRN

## 2023-06-03 NOTE — Discharge Summary (Addendum)
Physician Discharge Summary  Patient ID: [name] MRN: [id number] DOB/AGE: Sep 12, 1976 47 y.o.  Admit date: 06/02/2023 Discharge date: 06/03/2023  Problem List Principal Problem:   Anaphylactic shock  HPI: Patient is a 47 yo M w/ pertinent PMH renal cell CA, anxiety, deperession, tobacco abuse, chronic pain syndrome presents to [facility] ED on 8/12 w/ anaphylaxis from bee sting.   Patient recently admitted to [facility] on 7/17 with similar incident of bee sting causing anaphylactic shock on epi drip.  Patient required intubation by ENT for angioedema. Patient was extubated and discharged on 7/21 w/ epi pen.   On 8/12, patient stung by hornet and developed difficulty breathing, chest pain, tongue swelling, rash, and wheezing. EMS called and gave 3 IM epi, benadryl, solumedrol, and duonebs. Transferred to [facility]. On arrival to [facility] patient tremulous and shaky. No stridor and normal respirations. Tongue and swelling appears to be improved. Patient given pepcid and started on epi drip. PCCM consulted for icu admission and airway monitoring.   Hospital Course:  Anaphylaxis Hx of prior tracheostomy Plan: Fiberoptic  evaluation is unremarkable. ENT consult with Dr.Hoshal essentially unremarkable He was treated with usual pharmaceutical regimen of epinephrine EpiPen, steroids, H2 blocker. He reached maximal hospital benefit by 05/2023 he is ready for discharge home. He will continue on prednisone 10 mg daily for a total of 5 days. Patient instructed to follow-up with his primary care physician soon as possible for -Anxiety/depression Plan: Continue angiolytics   Chronic pain Plan: Continue home pharmaceutical regimen   Tobacco abuse Plan: Currently has a nicotine patch Should stop smoking   HTN Plan: Resume antihypertensive     Labs at discharge Lab Results  Component Value Date   CREATININE 1.14 06/02/2023   BUN 19 06/02/2023   NA 140 06/02/2023   K 3.5 06/02/2023   CL 107 06/02/2023    CO2 18 (L) 06/02/2023   Lab Results  Component Value Date   WBC 10.3 06/02/2023   HGB 11.0 (L) 06/02/2023   HCT 33.8 (L) 06/02/2023   MCV 96.6 06/02/2023   PLT 215 06/02/2023   Lab Results  Component Value Date   ALT 22 06/02/2023   AST 23 06/02/2023   ALKPHOS 69 06/02/2023   BILITOT 0.3 06/02/2023   Lab Results  Component Value Date   INR 1.14 02/19/2016   INR 1.09 02/15/2016   INR 1.09 02/14/2016    Current radiology studies US RENAL  Result Date: 06/02/2023 CLINICAL DATA:  Flank pain EXAM: RENAL / URINARY TRACT ULTRASOUND COMPLETE COMPARISON:  CT done on 01/08/2023 FINDINGS: Right Kidney: Renal measurements: 10.5 by 6.1 x 5.6 cm = volume: 187 mL. Echogenicity within normal limits. No mass or hydronephrosis visualized. Left Kidney: Renal measurements: 12.2 x 5.5 x 5.6 cm = volume: 195 mL. Echogenicity within normal limits. No mass or hydronephrosis visualized. Bladder: Appears normal for degree of bladder distention. Other: None. IMPRESSION: No sonographic abnormalities are seen in the kidneys. Electronically Signed   By: Ernie Avena M.D.   On: 06/02/2023 11:10   DG Chest Portable 1 View  Result Date: 06/02/2023 CLINICAL DATA:  Shortness of breath EXAM: PORTABLE CHEST 1 VIEW COMPARISON:  05/10/2023 FINDINGS: Cardiac shadow is enlarged but stable. Lungs are well aerated bilaterally. No focal infiltrate or effusion is seen. Postsurgical changes in the left ribcage are seen. No focal infiltrate is seen. Fractured sideplate is noted along the left seventh rib. This appears chronic. IMPRESSION: No acute abnormality noted. Electronically Signed   By: Alcide Clever  M.D.   On: 06/02/2023 00:37    Disposition:     Allergies as of 06/03/2023       Reactions   Bee Venom Shortness Of Breath, Swelling   Arm swells   Hornet Venom Anaphylaxis, Shortness Of Breath, Swelling   Arm swells        Medication List     TAKE these medications    albuterol 108 (90 Base)  MCG/ACT inhaler Commonly known as: VENTOLIN HFA Inhale 1-2 puffs into the lungs every 6 (six) hours as needed for wheezing or shortness of breath.   ALPRAZolam 1 MG tablet Commonly known as: Xanax Take 1 tablet (1 mg total) by mouth 3 (three) times daily as needed for up to 30 doses for anxiety. What changed:  medication strength how much to take when to take this   cetirizine 10 MG tablet Commonly known as: ZYRTEC 2 tablets 2 times per day What changed:  how much to take how to take this when to take this additional instructions   EC-Naproxen 500 MG EC tablet Generic drug: naproxen Take 500 mg by mouth 2 (two) times daily.   EPINEPHrine 0.3 mg/0.3 mL Soaj injection Commonly known as: EPI-PEN Inject 0.3 mg into the muscle as needed for anaphylaxis. As needed for life-threatening allergic reactions   famotidine 40 MG tablet Commonly known as: PEPCID Take 1 tablet (40 mg total) by mouth 2 (two) times daily.   fluticasone 50 MCG/ACT nasal spray Commonly known as: FLONASE Place 1 spray into both nostrils at bedtime.   gabapentin 300 MG capsule Commonly known as: NEURONTIN TAKE 3 CAPSULES IN THE MORNING, 3 CAPSULES AT LUNCH, AND 3 CAPSULES ARE BEDTIME What changed: See the new instructions.   hydrOXYzine 50 MG tablet Commonly known as: ATARAX Take 50 mg by mouth 2 (two) times daily.   lidocaine 5 % Commonly known as: LIDODERM PLACE 3 PATCHES ONTO THE SKIN DAILY. REMOVE & DISCARD PATCH WITHIN 12 HOURS OR AS DIRECTED BY MD What changed: additional instructions   meclizine 25 MG tablet Commonly known as: ANTIVERT Take 25 mg by mouth daily as needed for dizziness or nausea.   metoprolol succinate 100 MG 24 hr tablet Commonly known as: TOPROL-XL Take 100 mg by mouth daily.   montelukast 10 MG tablet Commonly known as: SINGULAIR Take 1 tablet (10 mg total) by mouth at bedtime.   oxyCODONE 5 MG immediate release tablet Commonly known as: Roxicodone Take 1 tablet  (5 mg total) by mouth 3 (three) times daily as needed for breakthrough pain.   phentermine 37.5 MG tablet Commonly known as: ADIPEX-P Take 18.75 mg by mouth every morning.   promethazine 25 MG tablet Commonly known as: PHENERGAN Take 25 mg by mouth every 6 (six) hours as needed for nausea or vomiting.   QUEtiapine 50 MG Tb24 24 hr tablet Commonly known as: SEROQUEL XR Take 50 mg by mouth.   sertraline 100 MG tablet Commonly known as: ZOLOFT Take 100 mg by mouth daily.   sildenafil 20 MG tablet Commonly known as: REVATIO Take 20 mg by mouth daily as needed (for ED).   SUMAtriptan 25 MG tablet Commonly known as: IMITREX Take 25 mg by mouth daily as needed for migraine or headache.   triamcinolone cream 0.1 % Commonly known as: KENALOG Apply 1 Application topically 2 (two) times daily as needed (for rash).   Xtampza ER 36 MG C12a Generic drug: oxyCODONE ER Take 1 capsule (36 mg total) by mouth every 12 (twelve)  hours. For chronic pain          Discharged Condition: fair  Time spent on discharge  40 minutes.  Vital signs at Discharge. Temp:  [97.6 F (36.4 C)-98.7 F (37.1 C)] 97.7 F (36.5 C) (08/13 0815) Pulse Rate:  [70-108] 92 (08/13 0815) Resp:  [12-20] 18 (08/13 0815) BP: (95-133)/(66-100) 119/98 (08/13 0815) SpO2:  [91 %-99 %] 99 % (08/13 0815) Weight:  [152.1 kg] 152.1 kg (08/13 0500) Office follow up Special Information or instructions. Needs follow-up as pain management.  Dr. Berline Chough and his primary care physician Signed: Brett Canales  ACNP Acute Care Nurse Practitioner [facility] Pulmonary/Critical Care Please consult Amion 06/03/2023, 8:52 AM

## 2023-06-03 NOTE — Progress Notes (Unsigned)
Subjective:    Patient ID: [name], male    DOB: 1976-01-01, 47 y.o.   MRN: [id number]  HPI: [name] is a 47 y.o. male who returns for follow up appointment for chronic pain and medication refill. He states his  pain is located in his mid- lower back pain radiating into his left buttock and left lower extremity. He states he is scheduled for ablation with [facility] on 06/09/2023.   [name] was admitted to [facility] on 05/07/2023 and discharged on 05/11/2023, for Anaphylactic Shock discharge summary reviewed.   [name] was seen in [facility] ED for Chronic Mid-line Low Back Pain without Sciatica, note was reviewed.   [name] was admitted to [facility] on 06/02/2023 and discharged on 06/03/2023 for Anaphylaxis, note was reviewed.      [name] Morphine equivalent is 142.50 MME. He  is also prescribed Alprazolam  by Dr. Katrinka Blazing  .We have discussed the black box warning of using opioids and benzodiazepines. I highlighted the dangers of using these drugs together and discussed the adverse events including respiratory suppression, overdose, cognitive impairment and importance of compliance with current regimen. We will continue to monitor and adjust as indicated.     Last UDS was Performed on 01/10/2023, it was consistent.   [name] rates his pain 8. His current exercise regime is walking and performing stretching exercises.     Pain Inventory Average Pain 8 Pain Right Now 8 My pain is constant, sharp, burning, dull, stabbing, tingling, and aching  In the last 24 hours, has pain interfered with the following? General activity 8 Relation with others 8 Enjoyment of life 8 What TIME of day is your pain at its worst? morning , daytime, evening, and night Sleep (in general)  good with medication  Pain is worse with: walking, bending, sitting, standing, and some activites Pain improves with: rest, heat/ice, and medication Relief from Meds: 5  Family History   Problem Relation Age of Onset   Alcohol abuse Mother    Cirrhosis Father    Colitis Father    Heart disease Father    Asthma Father    Other Father        Chemical Imbalance   Colon polyps Sister        intestinal problems   Other Brother        Intestinal Fissure   Other Brother        Chemical Imbalance   Diabetes Maternal Grandfather    Prostate cancer Paternal Grandfather    Asthma Son    Heart attack Other        Paternal Grandparents   Stroke Other        Paternal Grandparents   Social History   Socioeconomic History   Marital status: Divorced    Spouse name: Not on file   Number of children: 3   Years of education: Not on file   Highest education level: Not on file  Occupational History   Not on file  Tobacco Use   Smoking status: Every Day    Current packs/day: 1.00    Average packs/day: 1 pack/day for 24.0 years (24.0 ttl pk-yrs)    Types: Cigarettes   Smokeless tobacco: Never  Vaping Use   Vaping status: Never Used  Substance and Sexual Activity   Alcohol use: Not Currently    Alcohol/week: 0.0 standard drinks of alcohol   Drug use: No   Sexual activity: Not on file  Other  Topics Concern   Not on file  Social History Narrative   ** Merged History Encounter **          Right handed    Lives on 2nd floor   Social Determinants of Health   Financial Resource Strain: Not on file  Food Insecurity: No Food Insecurity (06/02/2023)   Hunger Vital Sign    Worried About Running Out of Food in the Last Year: Never true    Ran Out of Food in the Last Year: Never true  Transportation Needs: No Transportation Needs (06/02/2023)   PRAPARE - Administrator, Civil Service (Medical): No    Lack of Transportation (Non-Medical): No  Physical Activity: Not on file  Stress: Not on file  Social Connections: Not on file   Past Surgical History:  Procedure Laterality Date   COLONOSCOPY WITH PROPOFOL N/A 08/19/2013   Procedure: COLONOSCOPY WITH  PROPOFOL;  Surgeon: Rachael Fee, MD;  Location: [facility];  Service: Endoscopy;  Laterality: N/A;   ESOPHAGOGASTRODUODENOSCOPY (EGD) WITH PROPOFOL N/A 08/19/2013   Procedure: ESOPHAGOGASTRODUODENOSCOPY (EGD) WITH PROPOFOL;  Surgeon: Rachael Fee, MD;  Location: [facility];  Service: Endoscopy;  Laterality: N/A;   INTUBATION-ENDOTRACHEAL WITH TRACHEOSTOMY STANDBY N/A 05/07/2023   Procedure: INTUBATION-ENDOTRACHEAL WITH TRACHEOSTOMY STANDBY;  Surgeon: Diamantina Monks, MD;  Location: [facility] OR;  Service: General;  Laterality: N/A;   IR RADIOLOGIST EVAL & MGMT  05/20/2019   KNEE ARTHROSCOPY Left 07/13/2020   Procedure: left knee arthroscopy, debridement, cyst decompression;  Surgeon: Cammy Copa, MD;  Location: [facility];  Service: Orthopedics;  Laterality: Left;   ORIF MANDIBULAR FRACTURE N/A 02/16/2016   Procedure: OPEN REDUCTION INTERNAL FIXATION (ORIF) MANDIBULAR FRACTURE;  Surgeon: Suzanna Obey, MD;  Location: [facility] OR;  Service: ENT;  Laterality: N/A;   RIB PLATING Left 02/20/2016   Procedure: LEFT RIB PLATING;  Surgeon: Kerin Perna, MD;  Location: [facility] OR;  Service: Thoracic;  Laterality: Left;   ROBOTIC ASSITED PARTIAL NEPHRECTOMY Left 06/17/2013   Procedure: ROBOTIC ASSITED PARTIAL NEPHRECTOMY;  Surgeon: Crecencio Mc, MD;  Location: [facility];  Service: Urology;  Laterality: Left;   TRACHEOSTOMY TUBE PLACEMENT N/A 02/16/2016   Procedure: TRACHEOSTOMY;  Surgeon: Suzanna Obey, MD;  Location: [facility] OR;  Service: ENT;  Laterality: N/A;   VASECTOMY  2012   WISDOM TOOTH EXTRACTION  middle school   WRIST SURGERY Left middle school   "arteries and nerves tangled up"   Past Surgical History:  Procedure Laterality Date   COLONOSCOPY WITH PROPOFOL N/A 08/19/2013   Procedure: COLONOSCOPY WITH PROPOFOL;  Surgeon: Rachael Fee, MD;  Location: [facility];  Service: Endoscopy;  Laterality: N/A;   ESOPHAGOGASTRODUODENOSCOPY (EGD) WITH PROPOFOL N/A 08/19/2013   Procedure:  ESOPHAGOGASTRODUODENOSCOPY (EGD) WITH PROPOFOL;  Surgeon: Rachael Fee, MD;  Location: [facility];  Service: Endoscopy;  Laterality: N/A;   INTUBATION-ENDOTRACHEAL WITH TRACHEOSTOMY STANDBY N/A 05/07/2023   Procedure: INTUBATION-ENDOTRACHEAL WITH TRACHEOSTOMY STANDBY;  Surgeon: Diamantina Monks, MD;  Location: [facility];  Service: General;  Laterality: N/A;   IR RADIOLOGIST EVAL & MGMT  05/20/2019   KNEE ARTHROSCOPY Left 07/13/2020   Procedure: left knee arthroscopy, debridement, cyst decompression;  Surgeon: Cammy Copa, MD;  Location: [facility];  Service: Orthopedics;  Laterality: Left;   ORIF MANDIBULAR FRACTURE N/A 02/16/2016   Procedure: OPEN REDUCTION INTERNAL FIXATION (ORIF) MANDIBULAR FRACTURE;  Surgeon: Suzanna Obey, MD;  Location: [facility] OR;  Service: ENT;  Laterality: N/A;   RIB PLATING  Left 02/20/2016   Procedure: LEFT RIB PLATING;  Surgeon: Kerin Perna, MD;  Location: [facility] OR;  Service: Thoracic;  Laterality: Left;   ROBOTIC ASSITED PARTIAL NEPHRECTOMY Left 06/17/2013   Procedure: ROBOTIC ASSITED PARTIAL NEPHRECTOMY;  Surgeon: Crecencio Mc, MD;  Location: [facility];  Service: Urology;  Laterality: Left;   TRACHEOSTOMY TUBE PLACEMENT N/A 02/16/2016   Procedure: TRACHEOSTOMY;  Surgeon: Suzanna Obey, MD;  Location: [facility] OR;  Service: ENT;  Laterality: N/A;   VASECTOMY  2012   WISDOM TOOTH EXTRACTION  middle school   WRIST SURGERY Left middle school   "arteries and nerves tangled up"   Past Medical History:  Diagnosis Date   Anxiety and depression 08/01/2013   Cancer (HCC)    Cellulitis    left leg and stomach   Chicken pox    Chronic pain    Diarrhea 08/01/2013   History of kidney cancer    Hx of vasectomy    Hypertension    Renal cell carcinoma (HCC) 06/01/2013   There were no vitals taken for this visit.  Opioid Risk Score:   Fall Risk Score:  `1  Depression screen PHQ 2/9     09/16/2022    1:51 PM 06/28/2022    2:49 PM 10/01/2021    2:49 PM 07/02/2021    2:26  PM 11/19/2019    9:11 AM 07/19/2019    3:02 PM 04/05/2019    9:54 AM  Depression screen PHQ 2/9  Decreased Interest 0 0 1 1 2 2  0  Down, Depressed, Hopeless 0 0 3 1 2 2 1   PHQ - 2 Score 0 0 4 2 4 4 1   Altered sleeping     2 2 0  Tired, decreased energy     3 2 1   Change in appetite     2 1 0  Feeling bad or failure about yourself      2 2 0  Trouble concentrating     2 2 0  Moving slowly or fidgety/restless     2 2 0  Suicidal thoughts     0 0 0  PHQ-9 Score     17 15 2   Difficult doing work/chores     Very difficult Very difficult Somewhat difficult    Review of Systems  Musculoskeletal:  Positive for back pain.       Pain in both hips down to both feet. Pain on upper left side torso down to hip  All other systems reviewed and are negative.      Objective:   Physical Exam Vitals and nursing note reviewed.  Constitutional:      Appearance: Normal appearance. He is obese.  Cardiovascular:     Rate and Rhythm: Normal rate and regular rhythm.     Pulses: Normal pulses.     Heart sounds: Normal heart sounds.  Pulmonary:     Effort: Pulmonary effort is normal.     Breath sounds: Normal breath sounds.  Musculoskeletal:     Cervical back: Normal range of motion and neck supple.     Comments: Normal Muscle Bulk and Muscle Testing Reveals:  Upper Extremities: Full ROM and Muscle Strength 5/5 Thoracic Paraspinal Tenderness: T-7-T-9 Lumbar Paraspinal Tenderness: L-3-L-5 Lower Extremities: Full ROM and Muscle Strength 5/5 Arises from table with ease Narrow Based  Gait     Skin:    General: Skin is warm and dry.  Neurological:     Mental Status: He is alert and oriented to person,  place, and time.  Psychiatric:        Mood and Affect: Mood normal.        Behavior: Behavior normal.         Assessment & Plan:  Lumbar Radiculitis: Continue Gabapentin. Continue HEP as Tolerated . Continue to Monitor. 06/04/2023 2. Chronic Post Thoracotomy Pain: No complaints today. Continue  current medication regimen. Continue to monitor. 06/04/2023 3. Chronic Pain Syndrome: Refilled: Xtampza 36 mg every 12 hours #60 and Oxycodone 5mg  three times a day as needed for pain #90. Second script sent for the following month. We will continue the opioid monitoring program, this consists of regular clinic visits, examinations, urine drug screen, pill counts as well as use of [place] Controlled Substance Reporting system. A 12 month History has been reviewed on the [place] Controlled Substance Reporting System on 06/04/2023.  3. Myofascial Pain: No complaints today. Continue current medication regimen. Continue to Monitor. 06/04/2023 4. Anxiety: Referral placed to [facility].  Patient reports past history with PTSD. Continue to monitor.    F/U in 2 months

## 2023-06-03 NOTE — Plan of Care (Addendum)
Patient AOX4. Independent. Calls appropriately. NSR on tele. Discharging. No objections for discharge. Pain control with PRN medication and scheduled medication.   Patient given all morning medication. Son at bedside. Son will be patients ride home. All belongings in patients possession. All discharge instructions explained to patient and son.   Problem: Education: Goal: Ability to describe self-care measures that may prevent or decrease complications (Diabetes Survival Skills Education) will improve Outcome: Adequate for Discharge Goal: Individualized Educational Video(s) Outcome: Adequate for Discharge   Problem: Cardiac: Goal: Ability to maintain an adequate cardiac output will improve Outcome: Adequate for Discharge   Problem: Health Behavior/Discharge Planning: Goal: Ability to identify and utilize available resources and services will improve Outcome: Adequate for Discharge Goal: Ability to manage health-related needs will improve Outcome: Adequate for Discharge   Problem: Fluid Volume: Goal: Ability to achieve a balanced intake and output will improve Outcome: Adequate for Discharge   Problem: Metabolic: Goal: Ability to maintain appropriate glucose levels will improve Outcome: Adequate for Discharge   Problem: Nutritional: Goal: Maintenance of adequate nutrition will improve Outcome: Adequate for Discharge Goal: Maintenance of adequate weight for body size and type will improve Outcome: Adequate for Discharge   Problem: Respiratory: Goal: Will regain and/or maintain adequate ventilation Outcome: Adequate for Discharge   Problem: Urinary Elimination: Goal: Ability to achieve and maintain adequate renal perfusion and functioning will improve Outcome: Adequate for Discharge   Problem: Education: Goal: Knowledge of General Education information will improve Description: Including pain rating scale, medication(s)/side effects and non-pharmacologic comfort  measures Outcome: Adequate for Discharge   Problem: Health Behavior/Discharge Planning: Goal: Ability to manage health-related needs will improve Outcome: Adequate for Discharge   Problem: Clinical Measurements: Goal: Ability to maintain clinical measurements within normal limits will improve Outcome: Adequate for Discharge Goal: Will remain free from infection Outcome: Adequate for Discharge Goal: Diagnostic test results will improve Outcome: Adequate for Discharge Goal: Respiratory complications will improve Outcome: Adequate for Discharge Goal: Cardiovascular complication will be avoided Outcome: Adequate for Discharge   Problem: Activity: Goal: Risk for activity intolerance will decrease Outcome: Adequate for Discharge   Problem: Nutrition: Goal: Adequate nutrition will be maintained Outcome: Adequate for Discharge   Problem: Coping: Goal: Level of anxiety will decrease Outcome: Adequate for Discharge   Problem: Elimination: Goal: Will not experience complications related to bowel motility Outcome: Adequate for Discharge Goal: Will not experience complications related to urinary retention Outcome: Adequate for Discharge   Problem: Pain Managment: Goal: General experience of comfort will improve Outcome: Adequate for Discharge   Problem: Safety: Goal: Ability to remain free from injury will improve Outcome: Adequate for Discharge   Problem: Skin Integrity: Goal: Risk for impaired skin integrity will decrease Outcome: Adequate for Discharge   Problem: Education: Goal: Ability to describe self-care measures that may prevent or decrease complications (Diabetes Survival Skills Education) will improve Outcome: Adequate for Discharge Goal: Individualized Educational Video(s) Outcome: Adequate for Discharge   Problem: Coping: Goal: Ability to adjust to condition or change in health will improve Outcome: Adequate for Discharge   Problem: Fluid Volume: Goal:  Ability to maintain a balanced intake and output will improve Outcome: Adequate for Discharge   Problem: Health Behavior/Discharge Planning: Goal: Ability to identify and utilize available resources and services will improve Outcome: Adequate for Discharge Goal: Ability to manage health-related needs will improve Outcome: Adequate for Discharge   Problem: Metabolic: Goal: Ability to maintain appropriate glucose levels will improve Outcome: Adequate for Discharge   Problem: Nutritional: Goal:  Maintenance of adequate nutrition will improve Outcome: Adequate for Discharge Goal: Progress toward achieving an optimal weight will improve Outcome: Adequate for Discharge   Problem: Skin Integrity: Goal: Risk for impaired skin integrity will decrease Outcome: Adequate for Discharge   Problem: Tissue Perfusion: Goal: Adequacy of tissue perfusion will improve Outcome: Adequate for Discharge

## 2023-06-03 NOTE — Consult Note (Signed)
ENT CONSULT:  Reason for Consult: Rule out laryngeal edema after beesting  Referring Physician:  Dr. Manus Gunning  HPI: [name] is an 47 y.o. male with a history of tracheostomy and ORIF Mandible fractures 02/16/16 (Dr. 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 Given epi in field.   No stridor reported by ED or critical care team, however ENT has been requested for urgent consultation to "rule out laryngeal edema, in the setting of which the patient will have an epinephrine drip started". He has not required supplemental o2 in the ed.  Patient reports he is breathing comfortably right now. Son at bedside.    Past Medical History: Diagnosis Date  Anxiety and depression 08/01/2013  Cancer (HCC)   Cellulitis   left leg and stomach  Chicken pox   Chronic pain   Diarrhea 08/01/2013  History of kidney cancer   Hx of vasectomy   Hypertension   Renal cell carcinoma (HCC) 06/01/2013   Past Surgical History: Procedure Laterality Date  COLONOSCOPY WITH PROPOFOL N/A 08/19/2013  Procedure: COLONOSCOPY WITH PROPOFOL;  Surgeon: Rachael Fee, MD;  Location: [facility];  Service: Endoscopy;  Laterality: N/A;  ESOPHAGOGASTRODUODENOSCOPY (EGD) WITH PROPOFOL N/A 08/19/2013  Procedure: ESOPHAGOGASTRODUODENOSCOPY (EGD) WITH PROPOFOL;  Surgeon: Rachael Fee, MD;  Location: [facility];  Service: Endoscopy;  Laterality: N/A;  INTUBATION-ENDOTRACHEAL WITH TRACHEOSTOMY STANDBY N/A 05/07/2023  Procedure: INTUBATION-ENDOTRACHEAL WITH TRACHEOSTOMY STANDBY;  Surgeon: Diamantina Monks, MD;  Location: [facility] OR;  Service: General;  Laterality: N/A;  IR RADIOLOGIST EVAL & MGMT  05/20/2019  KNEE ARTHROSCOPY Left 07/13/2020  Procedure: left knee arthroscopy, debridement, cyst decompression;  Surgeon: Cammy Copa, MD;  Location: [facility];  Service: Orthopedics;   Laterality: Left;  ORIF MANDIBULAR FRACTURE N/A 02/16/2016  Procedure: OPEN REDUCTION INTERNAL FIXATION (ORIF) MANDIBULAR FRACTURE;  Surgeon: Suzanna Obey, MD;  Location: [facility] OR;  Service: ENT;  Laterality: N/A;  RIB PLATING Left 02/20/2016  Procedure: LEFT RIB PLATING;  Surgeon: Kerin Perna, MD;  Location: [facility] OR;  Service: Thoracic;  Laterality: Left;  ROBOTIC ASSITED PARTIAL NEPHRECTOMY Left 06/17/2013  Procedure: ROBOTIC ASSITED PARTIAL NEPHRECTOMY;  Surgeon: Crecencio Mc, MD;  Location: [facility];  Service: Urology;  Laterality: Left;  TRACHEOSTOMY TUBE PLACEMENT N/A 02/16/2016  Procedure: TRACHEOSTOMY;  Surgeon: Suzanna Obey, MD;  Location[facility] OR;  Service: ENT;  Laterality: N/A;  VASECTOMY  2012  WISDOM TOOTH EXTRACTION  middle school  WRIST SURGERY Left middle school  "arteries and nerves tangled up"   Family History Problem Relation Age of Onset  Alcohol abuse Mother   Cirrhosis Father   Colitis Father   Heart disease Father   Asthma Father   Other Father       Chemical Imbalance  Colon polyps Sister       intestinal problems  Other Brother       Intestinal Fissure  Other Brother       Chemical Imbalance  Diabetes Maternal Grandfather   Prostate cancer Paternal Grandfather   Asthma Son   Heart attack Other       Paternal Grandparents  Stroke Other       Paternal Grandparents   Social History:  reports that he has been smoking cigarettes. He has a 24 pack-year smoking history. He has never used smokeless tobacco. He reports that he does not currently use alcohol. He reports that he does not use  drugs.  Allergies:  Allergies Allergen Reactions  Bee Venom Shortness Of Breath and Swelling   Arm swells  Hornet Venom Anaphylaxis, Shortness Of Breath and Swelling   Arm swells   Medications: I have reviewed the patient's current medications.  Results for orders placed or performed during the hospital encounter of 06/02/23 (from the past 48 hour(s)) CBC with Differential      Status: Abnormal  Collection Time: 06/02/23 12:10 AM Result Value Ref Range  WBC 9.3 4.0 - 10.5 K/uL  RBC 4.33 4.22 - 5.81 MIL/uL  Hemoglobin 13.3 13.0 - 17.0 g/dL  HCT 06.3 01.6 - 01.0 %  MCV 92.6 80.0 - 100.0 fL  MCH 30.7 26.0 - 34.0 pg  MCHC 33.2 30.0 - 36.0 g/dL  RDW 93.2 35.5 - 73.2 %  Platelets 279 150 - 400 K/uL  nRBC 0.0 0.0 - 0.2 %  Neutrophils Relative % 49 %  Neutro Abs 4.6 1.7 - 7.7 K/uL  Lymphocytes Relative 33 %  Lymphs Abs 3.1 0.7 - 4.0 K/uL  Monocytes Relative 8 %  Monocytes Absolute 0.7 0.1 - 1.0 K/uL  Eosinophils Relative 9 %  Eosinophils Absolute 0.8 (H) 0.0 - 0.5 K/uL  Basophils Relative 0 %  Basophils Absolute 0.0 0.0 - 0.1 K/uL  Immature Granulocytes 1 %  Abs Immature Granulocytes 0.06 0.00 - 0.07 K/uL   Comment: Performed at [facility], [address], [place], [place] [address] Comprehensive metabolic panel     Status: Abnormal  Collection Time: 06/02/23 12:10 AM Result Value Ref Range  Sodium 139 135 - 145 mmol/L  Potassium 4.3 3.5 - 5.1 mmol/L  Chloride 102 98 - 111 mmol/L  CO2 23 22 - 32 mmol/L  Glucose, Bld 158 (H) 70 - 99 mg/dL   Comment: Glucose reference range applies only to samples taken after fasting for at least 8 hours.  BUN 18 6 - 20 mg/dL  Creatinine, Ser 2.70 6.23 - 1.24 mg/dL  Calcium 8.9 8.9 - 76.2 mg/dL  Total Protein 6.6 6.5 - 8.1 g/dL  Albumin 3.6 3.5 - 5.0 g/dL  AST 23 15 - 41 U/L  ALT 22 0 - 44 U/L  Alkaline Phosphatase 69 38 - 126 U/L  Total Bilirubin 0.3 0.3 - 1.2 mg/dL  GFR, Estimated >83 >15 mL/min   Comment: (NOTE) Calculated using the CKD-EPI Creatinine Equation (2021)   Anion gap 14 5 - 15   Comment: Performed at [facility], [address] [place], [place] [address] Troponin I (High Sensitivity)     Status: None  Collection Time: 06/02/23 12:10 AM Result Value Ref Range  Troponin I (High Sensitivity) 4 <18 ng/L   Comment: (NOTE) Elevated high sensitivity troponin I (hsTnI) values and  significant  changes across serial measurements may suggest ACS but many other  chronic and acute conditions are known to elevate hsTnI results.  Refer to the "Links" section for chest pain algorithms and additional  guidance. Performed at [facility], [address] [place], [place] [address]  Brain natriuretic peptide     Status: None  Collection Time: 06/02/23 12:10 AM Result Value Ref Range  B Natriuretic Peptide 18.8 0.0 - 100.0 pg/mL   Comment: Performed at [facility], [address] [place], [place] [address] I-stat chem 8, ED (not at [facility], [facility] or [facility])     Status: Abnormal  Collection Time: 06/02/23 12:37 AM Result Value Ref Range  Sodium 139 135 - 145 mmol/L  Potassium  4.2 3.5 - 5.1 mmol/L  Chloride 105 98 - 111 mmol/L  BUN 23 (H) 6 - 20 mg/dL  Creatinine, Ser 0.98 1.19 - 1.24 mg/dL  Glucose, Bld 147 (H) 70 - 99 mg/dL   Comment: Glucose reference range applies only to samples taken after fasting for at least 8 hours.  Calcium, Ion 1.14 (L) 1.15 - 1.40 mmol/L  TCO2 26 22 - 32 mmol/L  Hemoglobin 13.6 13.0 - 17.0 g/dL  HCT 82.9 56.2 - 13.0 %   DG Chest Portable 1 View  Result Date: 06/02/2023 CLINICAL DATA:  Shortness of breath EXAM: PORTABLE CHEST 1 VIEW COMPARISON:  05/10/2023 FINDINGS: Cardiac shadow is enlarged but stable. Lungs are well aerated bilaterally. No focal infiltrate or effusion is seen. Postsurgical changes in the left ribcage are seen. No focal infiltrate is seen. Fractured sideplate is noted along the left seventh rib. This appears chronic. IMPRESSION: No acute abnormality noted. Electronically Signed   By: Alcide Clever M.D.   On: 06/02/2023 00:37    QMV:HQIONGEX other than stated per HPI  Blood pressure (!) 96/59, pulse (!) 114, resp. rate 14, height 6\' 2"  (1.88 m), weight (!) 149.5 kg, SpO2 92%.  PHYSICAL EXAM:  CONSTITUTIONAL: well developed, nourished, no distress and alert and oriented x 3. NO STRIDOR OR INCREASED WORK OF BREATHING.   EYES: PERRL, EOMI  HENT: Head : normocephalic and atraumatic Ears: Right ear: External ears normal.  Nose: nose normal and no purulence Mouth/Throat:  Mouth: uvula midline and no oral lesions. No edema.  Throat: oropharynx clear and moist. No edema.  NECK: supple, trachea normal and no thyromegaly or cervical LAD. Prior tracheostomy.  NEURO: CN II-XII symmetric intact   Studies Reviewed:None  Preop diagnosis: Anaphylaxis  Postop diagnosis: same Procedure: Transnasal fiberoptic laryngoscopy Surgeon:  Anesth:none Compl: None Findings: Normal laryngoscopy without any acute findings of laryngeal angioedema. Mild Polypoid corditis (c/w Dr. Jenne Pane prior note 05/11/23):   Procedure Note: 31575(Flex lary)  Informed verbal consent was obtained after explaining the risks (including bleeding and infection), benefits and alternatives of the procedure. Verbal timeout was performed prior to the procedure. The nose was topicalized with topical lidocaine/oxymetazoline. The flexible laryngo scope was advanced through the right nasal cavity. The septum and turbinates appeared normal. The middle meatus was free of polyps of purulence. The eustachian tube, choana, and adenoids were normal in appearance. The hypopharynx, arytenoids, false vocal folds, and true vocal folds appeared normal. The visualized portion of the subglottis appeared normal. The patient tolerated the procedure with no immediate complications.    Assessment/Plan: 47 y/o M with history of tracheostomy/ORIF Mandible fractures in 2017, with recent intubation in the OR 1 month ago for anaphylaxis now with a normal laryngoscopy - no endoscopic or exam evidence of angioedema.  Recommendations: - No impending airway intervention anticipate given the patient's current clinical exam and laryngoscopy  Dispo: Medicine    Electronically signed by:  Scarlette Ar, MD  Staff Physician Facial Plastic & Reconstructive Surgery Otolaryngology  - Head and Neck Surgery [facility]   06/02/2023, 2:15 AM

## 2023-06-04 ENCOUNTER — Encounter: Payer: Medicaid Other | Attending: Registered Nurse | Admitting: Registered Nurse

## 2023-06-04 ENCOUNTER — Encounter: Payer: Self-pay | Admitting: Registered Nurse

## 2023-06-04 VITALS — BP 144/90 | HR 94 | Ht 74.0 in | Wt 341.0 lb

## 2023-06-04 DIAGNOSIS — Z5181 Encounter for therapeutic drug level monitoring: Secondary | ICD-10-CM

## 2023-06-04 DIAGNOSIS — M546 Pain in thoracic spine: Secondary | ICD-10-CM

## 2023-06-04 DIAGNOSIS — G8929 Other chronic pain: Secondary | ICD-10-CM | POA: Insufficient documentation

## 2023-06-04 DIAGNOSIS — G894 Chronic pain syndrome: Secondary | ICD-10-CM

## 2023-06-04 DIAGNOSIS — F411 Generalized anxiety disorder: Secondary | ICD-10-CM

## 2023-06-04 DIAGNOSIS — M7918 Myalgia, other site: Secondary | ICD-10-CM | POA: Diagnosis present

## 2023-06-04 DIAGNOSIS — Z79891 Long term (current) use of opiate analgesic: Secondary | ICD-10-CM | POA: Diagnosis present

## 2023-06-04 DIAGNOSIS — M5416 Radiculopathy, lumbar region: Secondary | ICD-10-CM | POA: Diagnosis present

## 2023-06-04 MED ORDER — OXYCODONE HCL 5 MG PO TABS: 5.0000 mg | ORAL_TABLET | Freq: Three times a day (TID) | ORAL | 0 refills | Status: DC | PRN

## 2023-06-04 MED ORDER — XTAMPZA ER 36 MG PO C12A: 36.0000 mg | EXTENDED_RELEASE_CAPSULE | Freq: Two times a day (BID) | ORAL | 0 refills | Status: DC

## 2023-06-11 ENCOUNTER — Ambulatory Visit: Payer: Medicaid Other | Admitting: Gastroenterology

## 2023-06-11 ENCOUNTER — Encounter: Payer: Self-pay | Admitting: Gastroenterology

## 2023-06-11 VITALS — BP 130/80 | HR 100 | Ht 71.0 in | Wt 338.5 lb

## 2023-06-11 DIAGNOSIS — K921 Melena: Secondary | ICD-10-CM

## 2023-06-11 DIAGNOSIS — K6289 Other specified diseases of anus and rectum: Secondary | ICD-10-CM | POA: Diagnosis not present

## 2023-06-11 DIAGNOSIS — K582 Mixed irritable bowel syndrome: Secondary | ICD-10-CM

## 2023-06-11 DIAGNOSIS — K602 Anal fissure, unspecified: Secondary | ICD-10-CM | POA: Diagnosis not present

## 2023-06-11 DIAGNOSIS — K589 Irritable bowel syndrome without diarrhea: Secondary | ICD-10-CM

## 2023-06-11 DIAGNOSIS — R140 Abdominal distension (gaseous): Secondary | ICD-10-CM

## 2023-06-11 MED ORDER — NA SULFATE-K SULFATE-MG SULF 17.5-3.13-1.6 GM/177ML PO SOLN: 1.0000 | Freq: Once | ORAL | 0 refills | Status: AC

## 2023-06-11 NOTE — Patient Instructions (Addendum)
You have been scheduled for a colonoscopy. Please follow written instructions given to you at your visit today.   Please pick up your prep supplies at the pharmacy within the next 1-3 days.  If you use inhalers (even only as needed), please bring them with you on the day of your procedure.  DO NOT TAKE 7 DAYS PRIOR TO TEST- Trulicity (dulaglutide) Ozempic, Wegovy (semaglutide) Mounjaro (tirzepatide) Bydureon Bcise (exanatide extended release)  DO NOT TAKE 1 DAY PRIOR TO YOUR TEST Rybelsus (semaglutide) Adlyxin (lixisenatide) Victoza (liraglutide) Byetta (exanatide)  Please purchase Metamucil over the counter. Take as directed.  ___________________________________________________________________________   If your blood pressure at your visit was 140/90 or greater, please contact your primary care physician to follow up on this.  _______________________________________________________  If you are age 48 or older, your body mass index should be between 23-30. Your Body mass index is 47.21 kg/m. If this is out of the aforementioned range listed, please consider follow up with your Primary Care Provider.  If you are age 36 or younger, your body mass index should be between 19-25. Your Body mass index is 47.21 kg/m. If this is out of the aformentioned range listed, please consider follow up with your Primary Care Provider.   ________________________________________________________  The [facility] providers would like to encourage you to use [facility] to communicate with providers for non-urgent requests or questions.  Due to long hold times on the telephone, sending your provider a message by [facility] may be a faster and more efficient way to get a response.  Please allow 48 business hours for a response.  Please remember that this is for non-urgent requests.   It was a pleasure to see you today!  Thank you for trusting me with your gastrointestinal care!    Scott E.Cunningham,MD
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
## 2023-06-13 ENCOUNTER — Ambulatory Visit: Payer: Medicaid Other | Admitting: Gastroenterology

## 2023-06-13 ENCOUNTER — Encounter: Payer: Self-pay | Admitting: Gastroenterology

## 2023-06-13 VITALS — BP 115/60 | HR 84 | Temp 98.2°F | Resp 13 | Ht 71.0 in | Wt 338.0 lb

## 2023-06-13 DIAGNOSIS — D123 Benign neoplasm of transverse colon: Secondary | ICD-10-CM

## 2023-06-13 DIAGNOSIS — D125 Benign neoplasm of sigmoid colon: Secondary | ICD-10-CM

## 2023-06-13 DIAGNOSIS — D128 Benign neoplasm of rectum: Secondary | ICD-10-CM | POA: Diagnosis not present

## 2023-06-13 DIAGNOSIS — D124 Benign neoplasm of descending colon: Secondary | ICD-10-CM

## 2023-06-13 DIAGNOSIS — K635 Polyp of colon: Secondary | ICD-10-CM

## 2023-06-13 DIAGNOSIS — K6289 Other specified diseases of anus and rectum: Secondary | ICD-10-CM

## 2023-06-13 DIAGNOSIS — K621 Rectal polyp: Secondary | ICD-10-CM

## 2023-06-13 MED ORDER — AMBULATORY NON FORMULARY MEDICATION: 1.0000 | Freq: Two times a day (BID) | 0 refills | Status: DC

## 2023-06-13 MED ORDER — SODIUM CHLORIDE 0.9 % IV SOLN: 500.0000 mL | Freq: Once | INTRAVENOUS | Status: DC

## 2023-06-13 NOTE — Op Note (Signed)
[facility] Patient Name: [name] Procedure Date: 06/13/2023 12:09 PM MRN: [id number] Endoscopist: Lorin Picket E. Tomasa Rand , MD, [id number] Age: 47 Referring MD:  Date of Birth: [birth year] Gender: Male Account #: [id number] Procedure:                Colonoscopy Indications:              Rectal pain Medicines:                Monitored Anesthesia Care Procedure:                Pre-Anesthesia Assessment:                           - Prior to the procedure, a History and Physical                            was performed, and patient medications and                            allergies were reviewed. The patient's tolerance of                            previous anesthesia was also reviewed. The risks                            and benefits of the procedure and the sedation                            options and risks were discussed with the patient.                            All questions were answered, and informed consent                            was obtained. Prior Anticoagulants: The patient has                            taken no anticoagulant or antiplatelet agents. ASA                            Grade Assessment: III - A patient with severe                            systemic disease. After reviewing the risks and                            benefits, the patient was deemed in satisfactory                            condition to undergo the procedure.                           After obtaining informed consent, the colonoscope  was passed under direct vision. Throughout the                            procedure, the patient's blood pressure, pulse, and                            oxygen saturations were monitored continuously. The                            Olympus CF-HQ190L [id number]) Colonoscope was                            introduced through the anus and advanced to the the                            terminal ileum, with identification of  the                            appendiceal orifice and IC valve. The colonoscopy                            was performed without difficulty. The patient                            tolerated the procedure well. The quality of the                            bowel preparation was adequate. The terminal ileum,                            ileocecal valve, appendiceal orifice, and rectum                            were photographed. Scope In: 12:13:42 PM Scope Out: 12:34:42 PM Scope Withdrawal Time: 0 hours 13 minutes 12 seconds  Total Procedure Duration: 0 hours 21 minutes 0 seconds  Findings:                 An anal fissure was found on perianal exam.                           Hemorrhoids were found on perianal exam.                           The digital rectal exam was normal. Pertinent                            negatives include normal sphincter tone and no                            palpable rectal lesions.                           A 3 mm polyp was found in the transverse colon. The  polyp was sessile. The polyp was removed with a                            cold snare. Resection and retrieval were complete.                            Estimated blood loss was minimal.                           A 3 mm polyp was found in the descending colon. The                            polyp was sessile. The polyp was removed with a                            cold snare. Resection and retrieval were complete.                            Estimated blood loss was minimal.                           A 3 mm polyp was found in the sigmoid colon. The                            polyp was sessile. The polyp was removed with a                            cold snare. Resection and retrieval were complete.                            Estimated blood loss was minimal.                           A 4 mm polyp was found in the rectum. The polyp was                            sessile. The polyp  was removed with a cold snare.                            Resection and retrieval were complete. Estimated                            blood loss was minimal.                           Multiple large-mouthed and small-mouthed                            diverticula were found in the sigmoid colon and                            descending colon.  The exam was otherwise normal throughout the                            examined colon.                           The terminal ileum appeared normal.                           Non-bleeding internal hemorrhoids were found during                            retroflexion. The hemorrhoids were Grade I                            (internal hemorrhoids that do not prolapse).                           No additional abnormalities were found on                            retroflexion. Complications:            No immediate complications. Estimated Blood Loss:     Estimated blood loss was minimal. Impression:               - Anal fissure found on perianal exam.                           - Hemorrhoids found on perianal exam.                           - One 3 mm polyp in the transverse colon, removed                            with a cold snare. Resected and retrieved.                           - One 3 mm polyp in the descending colon, removed                            with a cold snare. Resected and retrieved.                           - One 3 mm polyp in the sigmoid colon, removed with                            a cold snare. Resected and retrieved.                           - One 4 mm polyp in the rectum, removed with a cold                            snare. Resected and retrieved.                           -  Moderate diverticulosis in the sigmoid colon and                            in the descending colon.                           - The examined portion of the ileum was normal.                           - Non-bleeding internal  hemorrhoids.                           - No abnormalities to explain rectal pain with                            defecation. Recommendation:           - Patient has a contact number available for                            emergencies. The signs and symptoms of potential                            delayed complications were discussed with the                            patient. Return to normal activities tomorrow.                            Written discharge instructions were provided to the                            patient.                           - Resume previous diet.                           - Continue present medications.                           - Await pathology results.                           - Repeat colonoscopy (date not yet determined) for                            surveillance based on pathology results.                           - Treat anal fissure as previously discussed with                            topical nitroglycerin and daily Metamucil. Cauy Melody E. Tomasa Rand, MD 06/13/2023 12:42:48 PM This report has been signed electronically.

## 2023-06-13 NOTE — Progress Notes (Signed)
Patient snored under anesthesia. Chin lift needed occasionally, but no OAW or NAW was needed. Uneventful anesthetic. Report to pacu rn. Vss. Care resumed by rn.

## 2023-06-13 NOTE — Progress Notes (Signed)
History and Physical Interval Note:  06/13/2023 12:07 PM  [name]  has presented today for endoscopic procedure(s), with the diagnosis of  Encounter Diagnosis  Name Primary?   Rectal pain Yes  .  The various methods of evaluation and treatment have been discussed with the patient and/or family. After consideration of risks, benefits and other options for treatment, the patient has consented to  the endoscopic procedure(s).   The patient's history has been reviewed, patient examined, no change in status, stable for endoscopic procedure(s).  I have reviewed the patient's chart and labs.  Questions were answered to the patient's satisfaction.     Mahad Newstrom E. Tomasa Rand, MD [facility]

## 2023-06-13 NOTE — Patient Instructions (Signed)
Discharge instructions given. Handouts on polyps and Hemorrhoids. Prescription given to patient  resume previous medications. YOU HAD AN ENDOSCOPIC PROCEDURE TODAY AT THE  ENDOSCOPY CENTER:   Refer to the procedure report that was given to you for any specific questions about what was found during the examination.  If the procedure report does not answer your questions, please call your gastroenterologist to clarify.  If you requested that your care partner not be given the details of your procedure findings, then the procedure report has been included in a sealed envelope for you to review at your convenience later.  YOU SHOULD EXPECT: Some feelings of bloating in the abdomen. Passage of more gas than usual.  Walking can help get rid of the air that was put into your GI tract during the procedure and reduce the bloating. If you had a lower endoscopy (such as a colonoscopy or flexible sigmoidoscopy) you may notice spotting of blood in your stool or on the toilet paper. If you underwent a bowel prep for your procedure, you may not have a normal bowel movement for a few days.  Please Note:  You might notice some irritation and congestion in your nose or some drainage.  This is from the oxygen used during your procedure.  There is no need for concern and it should clear up in a day or so.  SYMPTOMS TO REPORT IMMEDIATELY:  Following lower endoscopy (colonoscopy or flexible sigmoidoscopy):  Excessive amounts of blood in the stool  Significant tenderness or worsening of abdominal pains  Swelling of the abdomen that is new, acute  Fever of 100F or higher   For urgent or emergent issues, a gastroenterologist can be reached at any hour by calling [phone]. Do not use MyChart messaging for urgent concerns.    DIET:  We do recommend a small meal at first, but then you may proceed to your regular diet.  Drink plenty of fluids but you should avoid alcoholic beverages for 24  hours.  ACTIVITY:  You should plan to take it easy for the rest of today and you should NOT DRIVE or use heavy machinery until tomorrow (because of the sedation medicines used during the test).    FOLLOW UP: Our staff will call the number listed on your records the next business day following your procedure.  We will call around 7:15- 8:00 am to check on you and address any questions or concerns that you may have regarding the information given to you following your procedure. If we do not reach you, we will leave a message.     If any biopsies were taken you will be contacted by phone or by letter within the next 1-3 weeks.  Please call us at [phone] if you have not heard about the biopsies in 3 weeks.    SIGNATURES/CONFIDENTIALITY: You and/or your care partner have signed paperwork which will be entered into your electronic medical record.  These signatures attest to the fact that that the information above on your After Visit Summary has been reviewed and is understood.  Full responsibility of the confidentiality of this discharge information lies with you and/or your care-partner.

## 2023-06-13 NOTE — Progress Notes (Signed)
Called to room to assist during endoscopic procedure.  Patient ID and intended procedure confirmed with present staff. Received instructions for my participation in the procedure from the performing physician.  

## 2023-06-13 NOTE — Progress Notes (Signed)
VS completed by RY  Pt's states no medical or surgical changes since previsit or office visit.

## 2023-06-16 ENCOUNTER — Telehealth: Payer: Self-pay

## 2023-06-16 NOTE — Telephone Encounter (Signed)
  Follow up Call-     06/13/2023   11:26 AM  Call back number  Post procedure Call Back phone  # [phone]  Permission to leave phone message Yes     Post op call attempted, no answer, left WM.

## 2023-06-17 NOTE — Progress Notes (Signed)
Mr. Brocato,  Only one polyp which I removed during your recent procedure was proven to be completely benign but is considered a "pre-cancerous" polyp that MAY have grown into cancer if it had not been removed.  The other polyps were not precancerous.   Studies shows that at least 20% of women over age 48 and 30% of men over age 86 have pre-cancerous polyps.  Based on current nationally recognized surveillance guidelines, I recommend that you have a repeat colonoscopy in 7 years.   Please continue the medications prescribed and follow up with in the office in 8 weeks if your symptoms are not improving.

## 2023-06-24 ENCOUNTER — Telehealth: Payer: Self-pay | Admitting: *Deleted

## 2023-06-24 ENCOUNTER — Encounter: Payer: Self-pay | Admitting: *Deleted

## 2023-06-24 NOTE — Telephone Encounter (Signed)
Approved today by [employer] [place] Case: [id number], Status: Approved, Coverage Starts on: 06/24/2023 12:00:00 AM, Coverage Ends on: 12/21/2023 12:00:00 AM.

## 2023-06-24 NOTE — Telephone Encounter (Signed)
Prior auth submitted to [employer] via [employer].

## 2023-06-27 ENCOUNTER — Other Ambulatory Visit: Payer: Self-pay | Admitting: Physical Medicine & Rehabilitation

## 2023-06-27 DIAGNOSIS — M5414 Radiculopathy, thoracic region: Secondary | ICD-10-CM

## 2023-07-11 ENCOUNTER — Other Ambulatory Visit: Payer: Self-pay | Admitting: Internal Medicine

## 2023-07-11 ENCOUNTER — Telehealth: Payer: Self-pay | Admitting: *Deleted

## 2023-07-11 NOTE — Telephone Encounter (Signed)
Prior auth submitted to insurance via CoverMyMeds for [employer].

## 2023-07-18 ENCOUNTER — Other Ambulatory Visit: Payer: Self-pay | Admitting: Physical Medicine and Rehabilitation

## 2023-07-18 MED ORDER — EC-NAPROXEN 500 MG PO TBEC: 500.0000 mg | DELAYED_RELEASE_TABLET | Freq: Two times a day (BID) | ORAL | 5 refills | Status: DC

## 2023-07-20 ENCOUNTER — Ambulatory Visit
Admission: RE | Admit: 2023-07-20 | Discharge: 2023-07-20 | Disposition: A | Discharge status: Home / Self Care | Payer: Medicaid Other | Source: Ambulatory Visit | Attending: Physical Medicine & Rehabilitation | Admitting: Physical Medicine & Rehabilitation

## 2023-07-20 DIAGNOSIS — M5414 Radiculopathy, thoracic region: Secondary | ICD-10-CM

## 2023-07-24 NOTE — Telephone Encounter (Signed)
Outcome Approved on September 20 by [facility] [place] Case: [id number], Status: Approved, Coverage Starts on: 07/11/2023 12:00:00 AM, Coverage Ends on: 10/09/2023 12:00:00 AM.

## 2023-07-25 NOTE — Telephone Encounter (Signed)
Denied refill.

## 2023-07-28 ENCOUNTER — Other Ambulatory Visit: Payer: Self-pay | Admitting: Physical Medicine & Rehabilitation

## 2023-07-28 ENCOUNTER — Ambulatory Visit
Admission: RE | Admit: 2023-07-28 | Discharge: 2023-07-28 | Disposition: A | Discharge status: Home / Self Care | Payer: Medicaid Other | Source: Ambulatory Visit | Attending: Physical Medicine & Rehabilitation | Admitting: Physical Medicine & Rehabilitation

## 2023-07-28 DIAGNOSIS — R0781 Pleurodynia: Secondary | ICD-10-CM

## 2023-08-04 ENCOUNTER — Telehealth: Payer: Self-pay | Admitting: Registered Nurse

## 2023-08-04 NOTE — Telephone Encounter (Signed)
The patient had to cancel his appt for tomorrow due to being out of town taking care of his sick grandmother. He will be out of his xtampza 36 mg on Saturday

## 2023-08-05 ENCOUNTER — Other Ambulatory Visit: Payer: Self-pay | Admitting: Physical Medicine and Rehabilitation

## 2023-08-05 ENCOUNTER — Encounter: Payer: Medicaid Other | Admitting: Registered Nurse

## 2023-08-05 MED ORDER — XTAMPZA ER 36 MG PO C12A: 36.0000 mg | EXTENDED_RELEASE_CAPSULE | Freq: Two times a day (BID) | ORAL | 0 refills | Status: DC

## 2023-08-05 NOTE — Telephone Encounter (Signed)
PMP was Reviewed.  Spoke with Mr. Vandunk, he is out of town caring for his grandmother. His appointment was changed to 08/21/2023. Xtampza e-scribed to pharmacy. He verbalizes understanding.

## 2023-08-14 ENCOUNTER — Encounter: Payer: Medicaid Other | Admitting: Registered Nurse

## 2023-08-21 ENCOUNTER — Encounter: Payer: Self-pay | Admitting: Registered Nurse

## 2023-08-21 ENCOUNTER — Encounter: Payer: Medicaid Other | Attending: Registered Nurse | Admitting: Registered Nurse

## 2023-08-21 VITALS — BP 118/80 | HR 102 | Ht 71.0 in | Wt 330.0 lb

## 2023-08-21 DIAGNOSIS — G894 Chronic pain syndrome: Secondary | ICD-10-CM | POA: Diagnosis present

## 2023-08-21 DIAGNOSIS — G8929 Other chronic pain: Secondary | ICD-10-CM | POA: Insufficient documentation

## 2023-08-21 DIAGNOSIS — M5416 Radiculopathy, lumbar region: Secondary | ICD-10-CM | POA: Diagnosis present

## 2023-08-21 DIAGNOSIS — Z5181 Encounter for therapeutic drug level monitoring: Secondary | ICD-10-CM | POA: Diagnosis present

## 2023-08-21 DIAGNOSIS — Z79891 Long term (current) use of opiate analgesic: Secondary | ICD-10-CM | POA: Diagnosis present

## 2023-08-21 DIAGNOSIS — M546 Pain in thoracic spine: Secondary | ICD-10-CM | POA: Insufficient documentation

## 2023-08-21 MED ORDER — OXYCODONE HCL 5 MG PO TABS: 5.0000 mg | ORAL_TABLET | Freq: Three times a day (TID) | ORAL | 0 refills | Status: DC | PRN

## 2023-08-21 MED ORDER — XTAMPZA ER 36 MG PO C12A: 36.0000 mg | EXTENDED_RELEASE_CAPSULE | Freq: Two times a day (BID) | ORAL | 0 refills | Status: DC

## 2023-08-21 NOTE — Progress Notes (Signed)
Subjective:    Patient ID: [name], male    DOB: 13-Sep-1976, 47 y.o.   MRN: [id number]  HPI: [name] is a 47 y.o. male who returns for follow up appointment for chronic pain and medication refill. He states his pain is located in his lower back radiating into his left lower extremity. He rates his pain 8. His current exercise regime is walking and performing stretching exercises.  [name] reports he will be starting a new job on 08/25/2023, he will be traveling with this job, he was given permission to call office if he needs to change his appointments. He is aware he needs to be seen every 2 months an verbalizes understanding.   He reports  he was seen by Dr Clerance Lav and will be scheduled for surgery, he will keep this provider updated. Also states Dr Clerance Lav instructed him to take a extra  Oxycodone  when pain is severe. He was instructed to follow his medication as ordered, we will continue current medication regimen, he verbalizes understanding.   [name] Morphine equivalent is 142.50 MME.   UDS ordered today.      Pain Inventory Average Pain 7 Pain Right Now 8 My pain is sharp, burning, dull, stabbing, tingling, and aching  In the last 24 hours, has pain interfered with the following? General activity 6 Relation with others 6 Enjoyment of life 6 What TIME of day is your pain at its worst? morning , daytime, and evening Sleep (in general) Fair  Pain is worse with: walking, bending, and standing Pain improves with: rest, heat/ice, medication, and injections Relief from Meds: 8  Family History  Problem Relation Age of Onset   Alcohol abuse Mother    Cirrhosis Mother    Cirrhosis Father    Colitis Father    Heart disease Father    Asthma Father    Other Father        Chemical Imbalance   Colon polyps Sister        intestinal problems   Other Brother        Intestinal Fissure   Diabetes Maternal Grandfather    Prostate cancer Paternal Grandfather     Asthma Son    Heart attack Other        Paternal Grandparents   Stroke Other        Paternal Grandparents   Social History   Socioeconomic History   Marital status: Divorced    Spouse name: Not on file   Number of children: 3   Years of education: Not on file   Highest education level: Not on file  Occupational History   Not on file  Tobacco Use   Smoking status: Every Day    Current packs/day: 1.00    Average packs/day: 1 pack/day for 24.0 years (24.0 ttl pk-yrs)    Types: Cigarettes   Smokeless tobacco: Never  Vaping Use   Vaping status: Every Day  Substance and Sexual Activity   Alcohol use: Not Currently    Alcohol/week: 0.0 standard drinks of alcohol   Drug use: No   Sexual activity: Not on file  Other Topics Concern   Not on file  Social History Narrative   ** Merged History Encounter **          Right handed    Lives on 2nd floor   Social Determinants of Health   Financial Resource Strain: Not on file  Food Insecurity: No Food Insecurity (06/02/2023)   Hunger Vital  Sign    Worried About Programme researcher, broadcasting/film/video in the Last Year: Never true    Ran Out of Food in the Last Year: Never true  Transportation Needs: No Transportation Needs (06/02/2023)   PRAPARE - Administrator, Civil Service (Medical): No    Lack of Transportation (Non-Medical): No  Physical Activity: Not on file  Stress: Not on file  Social Connections: Not on file   Past Surgical History:  Procedure Laterality Date   ABLATION     Lumbar   COLONOSCOPY WITH PROPOFOL N/A 08/19/2013   Procedure: COLONOSCOPY WITH PROPOFOL;  Surgeon: Rachael Fee, MD;  Location: [facility];  Service: Endoscopy;  Laterality: N/A;   ESOPHAGOGASTRODUODENOSCOPY (EGD) WITH PROPOFOL N/A 08/19/2013   Procedure: ESOPHAGOGASTRODUODENOSCOPY (EGD) WITH PROPOFOL;  Surgeon: Rachael Fee, MD;  Location: [facility];  Service: Endoscopy;  Laterality: N/A;   INTUBATION-ENDOTRACHEAL WITH TRACHEOSTOMY STANDBY  N/A 05/07/2023   Procedure: INTUBATION-ENDOTRACHEAL WITH TRACHEOSTOMY STANDBY;  Surgeon: Diamantina Monks, MD;  Location: [facility] OR;  Service: General;  Laterality: N/A;   IR RADIOLOGIST EVAL & MGMT  05/20/2019   KNEE ARTHROSCOPY Left 07/13/2020   Procedure: left knee arthroscopy, debridement, cyst decompression;  Surgeon: Cammy Copa, MD;  Location: [facility];  Service: Orthopedics;  Laterality: Left;   ORIF MANDIBULAR FRACTURE N/A 02/16/2016   Procedure: OPEN REDUCTION INTERNAL FIXATION (ORIF) MANDIBULAR FRACTURE;  Surgeon: Suzanna Obey, MD;  Location: [facility] OR;  Service: ENT;  Laterality: N/A;   RIB PLATING Left 02/20/2016   Procedure: LEFT RIB PLATING;  Surgeon: Kerin Perna, MD;  Location: [facility] OR;  Service: Thoracic;  Laterality: Left;   ROBOTIC ASSITED PARTIAL NEPHRECTOMY Left 06/17/2013   Procedure: ROBOTIC ASSITED PARTIAL NEPHRECTOMY;  Surgeon: Crecencio Mc, MD;  Location: [facility];  Service: Urology;  Laterality: Left;   TRACHEOSTOMY TUBE PLACEMENT N/A 02/16/2016   Procedure: TRACHEOSTOMY;  Surgeon: Suzanna Obey, MD;  Location: [facility] OR;  Service: ENT;  Laterality: N/A;   VASECTOMY  10/21/2010   WISDOM TOOTH EXTRACTION  middle school   WRIST SURGERY Left middle school   "arteries and nerves tangled up"   Past Surgical History:  Procedure Laterality Date   ABLATION     Lumbar   COLONOSCOPY WITH PROPOFOL N/A 08/19/2013   Procedure: COLONOSCOPY WITH PROPOFOL;  Surgeon: Rachael Fee, MD;  Location: [facility];  Service: Endoscopy;  Laterality: N/A;   ESOPHAGOGASTRODUODENOSCOPY (EGD) WITH PROPOFOL N/A 08/19/2013   Procedure: ESOPHAGOGASTRODUODENOSCOPY (EGD) WITH PROPOFOL;  Surgeon: Rachael Fee, MD;  Location: [facility];  Service: Endoscopy;  Laterality: N/A;   INTUBATION-ENDOTRACHEAL WITH TRACHEOSTOMY STANDBY N/A 05/07/2023   Procedure: INTUBATION-ENDOTRACHEAL WITH TRACHEOSTOMY STANDBY;  Surgeon: Diamantina Monks, MD;  Location: [facility] OR;  Service: General;   Laterality: N/A;   IR RADIOLOGIST EVAL & MGMT  05/20/2019   KNEE ARTHROSCOPY Left 07/13/2020   Procedure: left knee arthroscopy, debridement, cyst decompression;  Surgeon: Cammy Copa, MD;  Location: [facility];  Service: Orthopedics;  Laterality: Left;   ORIF MANDIBULAR FRACTURE N/A 02/16/2016   Procedure: OPEN REDUCTION INTERNAL FIXATION (ORIF) MANDIBULAR FRACTURE;  Surgeon: Suzanna Obey, MD;  Location: [facility] OR;  Service: ENT;  Laterality: N/A;   RIB PLATING Left 02/20/2016   Procedure: LEFT RIB PLATING;  Surgeon: Kerin Perna, MD;  Location: [facility] OR;  Service: Thoracic;  Laterality: Left;   ROBOTIC ASSITED PARTIAL NEPHRECTOMY Left 06/17/2013   Procedure: ROBOTIC ASSITED PARTIAL NEPHRECTOMY;  Surgeon: Crecencio Mc, MD;  Location: [facility];  Service: Urology;  Laterality: Left;   TRACHEOSTOMY TUBE PLACEMENT N/A 02/16/2016   Procedure: TRACHEOSTOMY;  Surgeon: Suzanna Obey, MD;  Location: [facility] OR;  Service: ENT;  Laterality: N/A;   VASECTOMY  10/21/2010   WISDOM TOOTH EXTRACTION  middle school   WRIST SURGERY Left middle school   "arteries and nerves tangled up"   Past Medical History:  Diagnosis Date   Anxiety and depression 08/01/2013   Arthritis    Asthma    Cellulitis    left leg and stomach   Chicken pox    Chronic pain    Diarrhea 08/01/2013   History of kidney cancer    Hx of vasectomy    Hypertension    Renal cell carcinoma (HCC) 06/01/2013   There were no vitals taken for this visit.  Opioid Risk Score:   Fall Risk Score:  `1  Depression screen PHQ 2/9     06/04/2023    1:20 PM 09/16/2022    1:51 PM 06/28/2022    2:49 PM 10/01/2021    2:49 PM 07/02/2021    2:26 PM 11/19/2019    9:11 AM 07/19/2019    3:02 PM  Depression screen PHQ 2/9  Decreased Interest 1 0 0 1 1 2 2   Down, Depressed, Hopeless 1 0 0 3 1 2 2   PHQ - 2 Score 2 0 0 4 2 4 4   Altered sleeping      2 2  Tired, decreased energy      3 2  Change in appetite      2 1  Feeling bad or failure  about yourself       2 2  Trouble concentrating      2 2  Moving slowly or fidgety/restless      2 2  Suicidal thoughts      0 0  PHQ-9 Score      17 15  Difficult doing work/chores      Very difficult Very difficult    Review of Systems  Musculoskeletal:  Positive for back pain.       Left leg pain  All other systems reviewed and are negative.     Objective:   Physical Exam Vitals and nursing note reviewed.  Constitutional:      Appearance: Normal appearance. He is obese.  Cardiovascular:     Rate and Rhythm: Normal rate and regular rhythm.     Pulses: Normal pulses.     Heart sounds: Normal heart sounds.  Pulmonary:     Effort: Pulmonary effort is normal.     Breath sounds: Normal breath sounds.  Musculoskeletal:     Cervical back: Normal range of motion and neck supple.     Comments: Normal Muscle Bulk and Muscle Testing Reveals:  Upper Extremities: Full ROM and Muscle Strength 5/5 Lumbar Paraspinal  Tenderness: L-4-L-5 Left Greater Trochanter Tenderness Lower Extremities : Full ROM and Muscle Strength 5/5 Arises from Table slowly Narrow Based Gait     Skin:    General: Skin is warm and dry.  Neurological:     Mental Status: He is alert and oriented to person, place, and time.  Psychiatric:        Mood and Affect: Mood normal.        Behavior: Behavior normal.         Assessment & Plan:  Lumbar Radiculitis: Continue Gabapentin. Continue HEP as Tolerated . Continue to Monitor. 08/21/2023 2. Chronic Post Thoracotomy Pain: No  complaints today. Continue current medication regimen. Continue to monitor. 08/21/2023 3. Chronic Pain Syndrome: Refilled: Xtampza 36 mg every 12 hours #60 and Oxycodone 5mg  three times a day as needed for pain #90. Second script sent for the following month. We will continue the opioid monitoring program, this consists of regular clinic visits, examinations, urine drug screen, pill counts as well as use of [place] Controlled Substance  Reporting system. A 12 month History has been reviewed on the [place] Controlled Substance Reporting System on 08/21/2023.  3. Myofascial Pain: No complaints today. Continue current medication regimen. Continue to Monitor. 08/21/2023 4. Anxiety: No Complaints today.   Patient reported past history with PTSD. Continue to monitor.    F/U in 2 months

## 2023-08-26 LAB — DRUG TOX MONITOR 1 W/CONF, ORAL FLD
Amphetamines: NEGATIVE ng/mL (ref <10)
Barbiturates: NEGATIVE ng/mL (ref <10)
Benzodiazepines: NEGATIVE ng/mL (ref <0.50)
Buprenorphine: NEGATIVE ng/mL (ref <0.10)
Cocaine: NEGATIVE ng/mL (ref <5.0)
Codeine: NEGATIVE ng/mL (ref <2.5)
Cotinine: >250 ng/mL — ABNORMAL HIGH (ref <5.0)
Dihydrocodeine: NEGATIVE ng/mL (ref <2.5)
Fentanyl: NEGATIVE ng/mL (ref <0.10)
Heroin Metabolite: NEGATIVE ng/mL (ref <1.0)
Hydrocodone: NEGATIVE ng/mL (ref <2.5)
Hydromorphone: NEGATIVE ng/mL (ref <2.5)
MARIJUANA: NEGATIVE ng/mL (ref <2.5)
MDMA: NEGATIVE ng/mL (ref <10)
Meprobamate: NEGATIVE ng/mL (ref <2.5)
Methadone: NEGATIVE ng/mL (ref <5.0)
Morphine: NEGATIVE ng/mL (ref <2.5)
Nicotine Metabolite: POSITIVE ng/mL — AB (ref <5.0)
Norhydrocodone: NEGATIVE ng/mL (ref <2.5)
Noroxycodone: 96.8 ng/mL — ABNORMAL HIGH (ref <2.5)
Opiates: POSITIVE ng/mL — AB (ref <2.5)
Oxycodone: >250 ng/mL — ABNORMAL HIGH (ref <2.5)
Oxymorphone: 6.9 ng/mL — ABNORMAL HIGH (ref <2.5)
Phencyclidine: NEGATIVE ng/mL (ref <10)
Tapentadol: NEGATIVE ng/mL (ref <5.0)
Tramadol: NEGATIVE ng/mL (ref <5.0)
Zolpidem: NEGATIVE ng/mL (ref <5.0)

## 2023-08-26 LAB — DRUG TOX ALC METAB W/CON, ORAL FLD: Alcohol Metabolite: NEGATIVE ng/mL (ref <25)

## 2023-08-30 ENCOUNTER — Other Ambulatory Visit: Payer: Self-pay | Admitting: Allergy and Immunology
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
## 2023-09-08 ENCOUNTER — Ambulatory Visit (HOSPITAL_COMMUNITY): Payer: Medicaid Other | Admitting: Student

## 2023-09-13 ENCOUNTER — Other Ambulatory Visit: Payer: Self-pay | Admitting: Physical Medicine and Rehabilitation

## 2023-09-29 ENCOUNTER — Other Ambulatory Visit: Payer: Self-pay

## 2023-09-29 MED ORDER — OXYCODONE HCL 5 MG PO TABS: 5.0000 mg | ORAL_TABLET | Freq: Three times a day (TID) | ORAL | 0 refills | Status: DC | PRN

## 2023-09-29 NOTE — Telephone Encounter (Signed)
[name] is currently working in [place]. Has requested a refill of the Oxycodone 5 MG to be sent to [facility] in [place].   Call back phone [phone] or [phone]

## 2023-10-13 ENCOUNTER — Encounter: Payer: Self-pay | Admitting: Registered Nurse

## 2023-10-13 ENCOUNTER — Encounter: Payer: Medicaid Other | Attending: Registered Nurse | Admitting: Registered Nurse

## 2023-10-13 ENCOUNTER — Telehealth: Payer: Self-pay

## 2023-10-13 VITALS — BP 135/82 | HR 99 | Ht 71.0 in | Wt 333.0 lb

## 2023-10-13 DIAGNOSIS — M5416 Radiculopathy, lumbar region: Secondary | ICD-10-CM | POA: Diagnosis present

## 2023-10-13 DIAGNOSIS — Z79891 Long term (current) use of opiate analgesic: Secondary | ICD-10-CM | POA: Diagnosis present

## 2023-10-13 DIAGNOSIS — G894 Chronic pain syndrome: Secondary | ICD-10-CM | POA: Diagnosis present

## 2023-10-13 DIAGNOSIS — M546 Pain in thoracic spine: Secondary | ICD-10-CM | POA: Diagnosis not present

## 2023-10-13 DIAGNOSIS — G8929 Other chronic pain: Secondary | ICD-10-CM | POA: Insufficient documentation

## 2023-10-13 DIAGNOSIS — Z5181 Encounter for therapeutic drug level monitoring: Secondary | ICD-10-CM | POA: Diagnosis not present

## 2023-10-13 NOTE — Telephone Encounter (Signed)
Marlowe Kays submitted- Labrian Osuch (Key: [id number])  Lidocaine patches submitted- Talen Moen (Key: [id number])

## 2023-10-13 NOTE — Progress Notes (Signed)
Subjective:    Patient ID: [name], male    DOB: November 08, 1975, 47 y.o.   MRN: [id number]  HPI: [name] is a 47 y.o. male who returns for follow up appointment for chronic pain and medication refill. He states his pain is located in his mid- lower back radiating into his left lower extremity and left foot. He rates his pain 8. His current exercise regime is walking and performing stretching exercises.  [name] Morphine equivalent is 142.50  MME.   Last oral swab was performed 08/21/2023, it was consistent.   [name] has a new job, he will send a My-Chart message if his appointment conflicts with his scheduled, we will re-scheduled to accommodate his work schedule. He verbalizes understanding.    Pain Inventory Average Pain 8 Pain Right Now 8 My pain is sharp, burning, dull, stabbing, tingling, and aching  In the last 24 hours, has pain interfered with the following? General activity 7 Relation with others 7 Enjoyment of life 7 What TIME of day is your pain at its worst? morning , daytime, evening, and night Sleep (in general) Poor  Pain is worse with: walking, bending, sitting, inactivity, and standing Pain improves with: rest, heat/ice, medication, and injections Relief from Meds: 5  Family History  Problem Relation Age of Onset   Alcohol abuse Mother    Cirrhosis Mother    Cirrhosis Father    Colitis Father    Heart disease Father    Asthma Father    Other Father        Chemical Imbalance   Colon polyps Sister        intestinal problems   Other Brother        Intestinal Fissure   Diabetes Maternal Grandfather    Prostate cancer Paternal Grandfather    Asthma Son    Heart attack Other        Paternal Grandparents   Stroke Other        Paternal Grandparents   Social History   Socioeconomic History   Marital status: Divorced    Spouse name: Not on file   Number of children: 3   Years of education: Not on file   Highest education level: Not on  file  Occupational History   Not on file  Tobacco Use   Smoking status: Every Day    Current packs/day: 1.00    Average packs/day: 1 pack/day for 24.0 years (24.0 ttl pk-yrs)    Types: Cigarettes   Smokeless tobacco: Never  Vaping Use   Vaping status: Every Day  Substance and Sexual Activity   Alcohol use: Not Currently    Alcohol/week: 0.0 standard drinks of alcohol   Drug use: No   Sexual activity: Not on file  Other Topics Concern   Not on file  Social History Narrative   ** Merged History Encounter **          Right handed    Lives on 2nd floor   Social Drivers of Health   Financial Resource Strain: Not on file  Food Insecurity: No Food Insecurity (06/02/2023)   Hunger Vital Sign    Worried About Running Out of Food in the Last Year: Never true    Ran Out of Food in the Last Year: Never true  Transportation Needs: No Transportation Needs (06/02/2023)   PRAPARE - Administrator, Civil Service (Medical): No    Lack of Transportation (Non-Medical): No  Physical Activity: Not on file  Stress: Not on file  Social Connections: Not on file   Past Surgical History:  Procedure Laterality Date   ABLATION     Lumbar   COLONOSCOPY WITH PROPOFOL N/A 08/19/2013   Procedure: COLONOSCOPY WITH PROPOFOL;  Surgeon: Rachael Fee, MD;  [facility];  Service: Endoscopy;  Laterality: N/A;   ESOPHAGOGASTRODUODENOSCOPY (EGD) WITH PROPOFOL N/A 08/19/2013   Procedure: ESOPHAGOGASTRODUODENOSCOPY (EGD) WITH PROPOFOL;  Surgeon: Rachael Fee, MD;  [facility];  Service: Endoscopy;  Laterality: N/A;   INTUBATION-ENDOTRACHEAL WITH TRACHEOSTOMY STANDBY N/A 05/07/2023   Procedure: INTUBATION-ENDOTRACHEAL WITH TRACHEOSTOMY STANDBY;  Surgeon: Diamantina Monks, MD;  [facility];  Service: General;  Laterality: N/A;   IR RADIOLOGIST EVAL & MGMT  05/20/2019   KNEE ARTHROSCOPY Left 07/13/2020   Procedure: left knee arthroscopy, debridement, cyst decompression;   Surgeon: Cammy Copa, MD;  Location: [facility];  Service: Orthopedics;  Laterality: Left;   ORIF MANDIBULAR FRACTURE N/A 02/16/2016   Procedure: OPEN REDUCTION INTERNAL FIXATION (ORIF) MANDIBULAR FRACTURE;  Surgeon: Suzanna Obey, MD;  Location[facility] OR;  Service: ENT;  Laterality: N/A;   RIB PLATING Left 02/20/2016   Procedure: LEFT RIB PLATING;  Surgeon: Kerin Perna, MD;  Location: [facility] OR;  Service: Thoracic;  Laterality: Left;   ROBOTIC ASSITED PARTIAL NEPHRECTOMY Left 06/17/2013   Procedure: ROBOTIC ASSITED PARTIAL NEPHRECTOMY;  Surgeon: Crecencio Mc, MD;  [facility];  Service: Urology;  Laterality: Left;   TRACHEOSTOMY TUBE PLACEMENT N/A 02/16/2016   Procedure: TRACHEOSTOMY;  Surgeon: Suzanna Obey, MD;  Location[facility] OR;  Service: ENT;  Laterality: N/A;   VASECTOMY  10/21/2010   WISDOM TOOTH EXTRACTION  middle school   WRIST SURGERY Left middle school   "arteries and nerves tangled up"   Past Surgical History:  Procedure Laterality Date   ABLATION     Lumbar   COLONOSCOPY WITH PROPOFOL N/A 08/19/2013   Procedure: COLONOSCOPY WITH PROPOFOL;  Surgeon: Rachael Fee, MD;  [facility];  Service: Endoscopy;  Laterality: N/A;   ESOPHAGOGASTRODUODENOSCOPY (EGD) WITH PROPOFOL N/A 08/19/2013   Procedure: ESOPHAGOGASTRODUODENOSCOPY (EGD) WITH PROPOFOL;  Surgeon: Rachael Fee, MD;  [facility];  Service: Endoscopy;  Laterality: N/A;   INTUBATION-ENDOTRACHEAL WITH TRACHEOSTOMY STANDBY N/A 05/07/2023   Procedure: INTUBATION-ENDOTRACHEAL WITH TRACHEOSTOMY STANDBY;  Surgeon: Diamantina Monks, MD;  [facility];  Service: General;  Laterality: N/A;   IR RADIOLOGIST EVAL & MGMT  05/20/2019   KNEE ARTHROSCOPY Left 07/13/2020   Procedure: left knee arthroscopy, debridement, cyst decompression;  Surgeon: Cammy Copa, MD;  Location: [facility];  Service: Orthopedics;  Laterality: Left;   ORIF MANDIBULAR FRACTURE N/A 02/16/2016    Procedure: OPEN REDUCTION INTERNAL FIXATION (ORIF) MANDIBULAR FRACTURE;  Surgeon: Suzanna Obey, MD;  Location: [facility] OR;  Service: ENT;  Laterality: N/A;   RIB PLATING Left 02/20/2016   Procedure: LEFT RIB PLATING;  Surgeon: Kerin Perna, MD;  Location: [facility] OR;  Service: Thoracic;  Laterality: Left;   ROBOTIC ASSITED PARTIAL NEPHRECTOMY Left 06/17/2013   Procedure: ROBOTIC ASSITED PARTIAL NEPHRECTOMY;  Surgeon: Crecencio Mc, MD;  [facility];  Service: Urology;  Laterality: Left;   TRACHEOSTOMY TUBE PLACEMENT N/A 02/16/2016   Procedure: TRACHEOSTOMY;  Surgeon: Suzanna Obey, MD;  Location: [facility] OR;  Service: ENT;  Laterality: N/A;   VASECTOMY  10/21/2010   WISDOM TOOTH EXTRACTION  middle school   WRIST SURGERY Left middle school   "arteries and nerves tangled up"  Past Medical History:  Diagnosis Date   Anxiety and depression 08/01/2013   Arthritis    Asthma    Cellulitis    left leg and stomach   Chicken pox    Chronic pain    Diarrhea 08/01/2013   History of kidney cancer    Hx of vasectomy    Hypertension    Renal cell carcinoma (HCC) 06/01/2013   There were no vitals taken for this visit.  Opioid Risk Score:   Fall Risk Score:  `1  Depression screen PHQ 2/9     06/04/2023    1:20 PM 09/16/2022    1:51 PM 06/28/2022    2:49 PM 10/01/2021    2:49 PM 07/02/2021    2:26 PM 11/19/2019    9:11 AM 07/19/2019    3:02 PM  Depression screen PHQ 2/9  Decreased Interest 1 0 0 1 1 2 2   Down, Depressed, Hopeless 1 0 0 3 1 2 2   PHQ - 2 Score 2 0 0 4 2 4 4   Altered sleeping      2 2  Tired, decreased energy      3 2  Change in appetite      2 1  Feeling bad or failure about yourself       2 2  Trouble concentrating      2 2  Moving slowly or fidgety/restless      2 2  Suicidal thoughts      0 0  PHQ-9 Score      17 15  Difficult doing work/chores      Very difficult Very difficult      Review of Systems  Musculoskeletal:  Positive for back pain and gait problem.  All other  systems reviewed and are negative.     Objective:   Physical Exam Vitals and nursing note reviewed.  Constitutional:      Appearance: Normal appearance.  Cardiovascular:     Rate and Rhythm: Normal rate and regular rhythm.     Pulses: Normal pulses.     Heart sounds: Normal heart sounds.  Pulmonary:     Effort: Pulmonary effort is normal.     Breath sounds: Normal breath sounds.  Musculoskeletal:     Comments: Normal Muscle Bulk and Muscle Testing Reveals:  Upper Extremities: Full ROM and Muscle Strength  5/5  Lumbar Paraspinal Tenderness: L-3-L-5 Left Greater Trochanter Tenderness Lower Extremities: Full ROM and Muscle Strength 5/5 Arises from Table with ease Narrow Based  Gait     Skin:    General: Skin is warm and dry.  Neurological:     Mental Status: He is alert and oriented to person, place, and time.  Psychiatric:        Mood and Affect: Mood normal.        Behavior: Behavior normal.         Assessment & Plan:  Lumbar Radiculitis: Continue Gabapentin. Continue HEP as Tolerated . Continue to Monitor. 10/13/2023 2. Chronic Post Thoracotomy Pain: No complaints today. Continue current medication regimen. Continue to monitor. 10/13/2023 3. Chronic Pain Syndrome: Continue : Xtampza 36 mg every 12 hours #60 and Oxycodone 5mg  three times a day as needed for pain #90.  We will continue the opioid monitoring program, this consists of regular clinic visits, examinations, urine drug screen, pill counts as well as use of [place] Controlled Substance Reporting system. A 12 month History has been reviewed on the [place] Controlled Substance Reporting System on  10/13/2023.  3. Myofascial Pain: No complaints today. Continue current medication regimen. Continue to Monitor. 10/13/2023 4. Anxiety: No Complaints today.   Patient reported past history with PTSD. Continue to monitor. 10/13/2023   F/U in 2 months

## 2023-10-16 NOTE — Telephone Encounter (Signed)
Request #: [id number] PA approved for Lidocaine patches valid 10/13/23-10/12/24 Request #: [id number] PA approved for Xtampza ER 36 MG 10/13/23-01/11/2024

## 2023-10-20 ENCOUNTER — Encounter: Payer: Medicaid Other | Admitting: Registered Nurse

## 2023-11-17 ENCOUNTER — Telehealth: Payer: Self-pay

## 2023-11-17 MED ORDER — OXYCODONE HCL 5 MG PO TABS: 5.0000 mg | ORAL_TABLET | Freq: Three times a day (TID) | ORAL | 0 refills | Status: DC | PRN

## 2023-11-17 MED ORDER — XTAMPZA ER 36 MG PO C12A: 36.0000 mg | EXTENDED_RELEASE_CAPSULE | Freq: Two times a day (BID) | ORAL | 0 refills | Status: DC

## 2023-11-17 NOTE — Telephone Encounter (Signed)
Patient stated his out of Xtampza ER 36 MG. He would have call sooner but he has the Flu.  (Dr. Berline Chough is off this week).  PMP:  Filled  Written  ID  Drug  QTY  Days  Prescriber  RX #  Dispenser  Refill  Daily Dose*  Pymt Type  PMP  11/02/2023 08/21/2023 1  Oxycodone Hcl (Ir) 5 Mg Tablet 90.00 30 Eu Tho [id number] Nor (9825) 0/0 22.50 MME Medicaid [place] 10/13/2023 08/21/2023 1  Xtampza Er 36 Mg Capsule 60.00 30 Eu Tho [id number] Nor (9825) 0/0 120.00 MME Medicaid [place] 10/13/2023 09/15/2023 1  Gabapentin 300 Mg Capsule 270.00 30 Me Lov [id number] Nor (9825) 1/5  Medicaid [place]

## 2023-11-17 NOTE — Telephone Encounter (Signed)
[name] called office reporting he has the FLU, he is scheduled for an appointment in February.  Xtampza and Oxycodone e-scribed to pharmacy. [name] is aware of the above.

## 2023-11-18 ENCOUNTER — Telehealth: Payer: Self-pay

## 2023-11-18 NOTE — Telephone Encounter (Signed)
(  Key: [id number]) PA Case ID #: [id number] Marlowe Kays

## 2023-11-20 ENCOUNTER — Telehealth: Payer: Self-pay | Admitting: *Deleted

## 2023-11-20 NOTE — Telephone Encounter (Signed)
New insurance [employer] bin # [phone]  PCN Abbe [place]  ID [id number] GRP [id number]   Mr Epple has called to see if the prior aut is been done with correct insurance.Melton Alar is working on the prior [place].  Riley Lam spoke with Mr Bolander and he has been without his Marlowe Kays so he has had to take more of his oxycodone. He told her he had to take 5 of the oxycodone per day starting on 11/17/23

## 2023-11-20 NOTE — Telephone Encounter (Signed)
Contacted [name] and advised per [place] w/  insurance at [phone] that review should be completed by EOB today or sometime tomorrow, patient verbalized understanding

## 2023-11-21 ENCOUNTER — Telehealth: Payer: Self-pay | Admitting: Registered Nurse

## 2023-11-21 MED ORDER — OXYCODONE HCL 10 MG PO TABS: 10.0000 mg | ORAL_TABLET | Freq: Four times a day (QID) | ORAL | 0 refills | Status: DC | PRN

## 2023-11-21 MED ORDER — MORPHINE SULFATE ER 30 MG PO TBCR
30.0000 mg | EXTENDED_RELEASE_TABLET | Freq: Two times a day (BID) | ORAL | 0 refills | Status: DC
Start: 2023-11-21 — End: 2023-12-25

## 2023-11-21 NOTE — Telephone Encounter (Signed)
Mr. Ivy Meriwether insurance has denied his Xtampza. His  current MME 142.50 PMP was Reviewed. In the past he was prescribed MS Contin by Dr Berline Chough . His Xtampza will be discontinued, we will prescribe MS Contin  and his Oxycodone will be increased to 10 mg 4 times a day as needed for pain. His current MME will be 120.00, we will continue to observe.  Call place to Mr. Falwell, no answer, left messagae to return the call.

## 2023-11-21 NOTE — Telephone Encounter (Signed)
Denied because patient got new insurance. PA submitted to that insurance.

## 2023-11-24 ENCOUNTER — Telehealth: Payer: Self-pay

## 2023-11-24 NOTE — Telephone Encounter (Signed)
(  Key: [id number]) PA Case ID #: providers office Oxycodone IR

## 2023-11-24 NOTE — Telephone Encounter (Signed)
PA has been submitted to [employer] for both medications

## 2023-11-24 NOTE — Telephone Encounter (Signed)
PA for Morphine Sulfate submitted.

## 2023-11-28 NOTE — Telephone Encounter (Signed)
 [employer] Morphine  approved for 6 fills of 120 units 11/25/23 through 05/24/24. Oxycodone  approved for 6 fills of 120 units 11/24/23 through 05/23/24.  [employer] Morphine  ER approved for 120 units 11/24/23-02/22/24 Oxycodone  IR approved for 120 units 11/24/23-05/22/24

## 2023-12-05 ENCOUNTER — Encounter: Payer: Medicaid Other | Admitting: Registered Nurse

## 2023-12-22 NOTE — Progress Notes (Deleted)
 Subjective:    Patient ID: [name], male    DOB: 1976-03-11, 48 y.o.   MRN: [id number]  HPI: [name] is a 48 y.o. male who returns for follow up appointment for chronic pain and medication refill. states *** pain is located in  ***. rates pain ***. current exercise regime is walking and performing stretching exercises.  Mr. Blossom Morphine equivalent is 120.00 MME.   Oral Swab was Performed today.      Pain Inventory Average Pain 9 Pain Right Now 9 My pain is constant, sharp, burning, dull, stabbing, tingling, and aching  In the last 24 hours, has pain interfered with the following? General activity 8 Relation with others 8 Enjoyment of life 8 What TIME of day is your pain at its worst? morning , daytime, evening, and night Sleep (in general) Poor  Pain is worse with: walking, bending, standing, and some activites Pain improves with: rest and medication Relief from Meds: 6  Family History  Problem Relation Age of Onset   Alcohol abuse Mother    Cirrhosis Mother    Cirrhosis Father    Colitis Father    Heart disease Father    Asthma Father    Other Father        Chemical Imbalance   Colon polyps Sister        intestinal problems   Other Brother        Intestinal Fissure   Diabetes Maternal Grandfather    Prostate cancer Paternal Grandfather    Asthma Son    Heart attack Other        Paternal Grandparents   Stroke Other        Paternal Grandparents   Social History   Socioeconomic History   Marital status: Divorced    Spouse name: Not on file   Number of children: 3   Years of education: Not on file   Highest education level: Not on file  Occupational History   Not on file  Tobacco Use   Smoking status: Every Day    Current packs/day: 1.00    Average packs/day: 1 pack/day for 24.0 years (24.0 ttl pk-yrs)    Types: Cigarettes   Smokeless tobacco: Never  Vaping Use   Vaping status: Every Day  Substance and Sexual Activity   Alcohol use:  Not Currently    Alcohol/week: 0.0 standard drinks of alcohol   Drug use: No   Sexual activity: Not on file  Other Topics Concern   Not on file  Social History Narrative   ** Merged History Encounter **          Right handed    Lives on 2nd floor   Social Drivers of Health   Financial Resource Strain: Not on file  Food Insecurity: No Food Insecurity (06/02/2023)   Hunger Vital Sign    Worried About Running Out of Food in the Last Year: Never true    Ran Out of Food in the Last Year: Never true  Transportation Needs: No Transportation Needs (06/02/2023)   PRAPARE - Administrator, Civil Service (Medical): No    Lack of Transportation (Non-Medical): No  Physical Activity: Not on file  Stress: Not on file  Social Connections: Not on file   Past Surgical History:  Procedure Laterality Date   ABLATION     Lumbar   COLONOSCOPY WITH PROPOFOL N/A 08/19/2013   Procedure: COLONOSCOPY WITH PROPOFOL;  Surgeon: Rachael Fee, MD;  Location: [facility];  Service: Endoscopy;  Laterality: N/A;   ESOPHAGOGASTRODUODENOSCOPY (EGD) WITH PROPOFOL N/A 08/19/2013   Procedure: ESOPHAGOGASTRODUODENOSCOPY (EGD) WITH PROPOFOL;  Surgeon: Rachael Fee, MD;  [facility]: [facility];  Service: Endoscopy;  Laterality: N/A;   INTUBATION-ENDOTRACHEAL WITH TRACHEOSTOMY STANDBY N/A 05/07/2023   Procedure: INTUBATION-ENDOTRACHEAL WITH TRACHEOSTOMY STANDBY;  Surgeon: Diamantina Monks, MD;  [facility]: [facility];  Service: General;  Laterality: N/A;   IR RADIOLOGIST EVAL & MGMT  05/20/2019   KNEE ARTHROSCOPY Left 07/13/2020   Procedure: left knee arthroscopy, debridement, cyst decompression;  Surgeon: Cammy Copa, MD;  Location: [facility];  Service: Orthopedics;  Laterality: Left;   ORIF MANDIBULAR FRACTURE N/A 02/16/2016   Procedure: OPEN REDUCTION INTERNAL FIXATION (ORIF) MANDIBULAR FRACTURE;  Surgeon: Suzanna Obey, MD;  Location: [facility] OR;  Service: ENT;  Laterality: N/A;   RIB  PLATING Left 02/20/2016   Procedure: LEFT RIB PLATING;  Surgeon: Kerin Perna, MD;  Location: [facility] OR;  Service: Thoracic;  Laterality: Left;   ROBOTIC ASSITED PARTIAL NEPHRECTOMY Left 06/17/2013   Procedure: ROBOTIC ASSITED PARTIAL NEPHRECTOMY;  Surgeon: Crecencio Mc, MD;  [facility];  Service: Urology;  Laterality: Left;   TRACHEOSTOMY TUBE PLACEMENT N/A 02/16/2016   Procedure: TRACHEOSTOMY;  Surgeon: Suzanna Obey, MD;  Location: [facility] OR;  Service: ENT;  Laterality: N/A;   VASECTOMY  10/21/2010   WISDOM TOOTH EXTRACTION  middle school   WRIST SURGERY Left middle school   "arteries and nerves tangled up"   Past Surgical History:  Procedure Laterality Date   ABLATION     Lumbar   COLONOSCOPY WITH PROPOFOL N/A 08/19/2013   Procedure: COLONOSCOPY WITH PROPOFOL;  Surgeon: Rachael Fee, MD;  [facility];  Service: Endoscopy;  Laterality: N/A;   ESOPHAGOGASTRODUODENOSCOPY (EGD) WITH PROPOFOL N/A 08/19/2013   Procedure: ESOPHAGOGASTRODUODENOSCOPY (EGD) WITH PROPOFOL;  Surgeon: Rachael Fee, MD;  [facility];  Service: Endoscopy;  Laterality: N/A;   INTUBATION-ENDOTRACHEAL WITH TRACHEOSTOMY STANDBY N/A 05/07/2023   Procedure: INTUBATION-ENDOTRACHEAL WITH TRACHEOSTOMY STANDBY;  Surgeon: Diamantina Monks, MD;  [facility];  Service: General;  Laterality: N/A;   IR RADIOLOGIST EVAL & MGMT  05/20/2019   KNEE ARTHROSCOPY Left 07/13/2020   Procedure: left knee arthroscopy, debridement, cyst decompression;  Surgeon: Cammy Copa, MD;  Location: [facility];  Service: Orthopedics;  Laterality: Left;   ORIF MANDIBULAR FRACTURE N/A 02/16/2016   Procedure: OPEN REDUCTION INTERNAL FIXATION (ORIF) MANDIBULAR FRACTURE;  Surgeon: Suzanna Obey, MD;  Location: [facility] OR;  Service: ENT;  Laterality: N/A;   RIB PLATING Left 02/20/2016   Procedure: LEFT RIB PLATING;  Surgeon: Kerin Perna, MD;  Location: [facility] OR;  Service: Thoracic;  Laterality: Left;   ROBOTIC  ASSITED PARTIAL NEPHRECTOMY Left 06/17/2013   Procedure: ROBOTIC ASSITED PARTIAL NEPHRECTOMY;  Surgeon: Crecencio Mc, MD;  [facility];  Service: Urology;  Laterality: Left;   TRACHEOSTOMY TUBE PLACEMENT N/A 02/16/2016   Procedure: TRACHEOSTOMY;  Surgeon: Suzanna Obey, MD;  Location: [facility] OR;  Service: ENT;  Laterality: N/A;   VASECTOMY  10/21/2010   WISDOM TOOTH EXTRACTION  middle school   WRIST SURGERY Left middle school   "arteries and nerves tangled up"   Past Medical History:  Diagnosis Date   Anxiety and depression 08/01/2013   Arthritis    Asthma    Cellulitis    left leg and stomach   Chicken pox    Chronic pain    Diarrhea 08/01/2013   History of kidney cancer  Hx of vasectomy    Hypertension    Renal cell carcinoma (HCC) 06/01/2013   There were no vitals taken for this visit.  Opioid Risk Score:   Fall Risk Score:  `1  Depression screen [facility] 2/9     10/13/2023    9:43 AM 06/04/2023    1:20 PM 09/16/2022    1:51 PM 06/28/2022    2:49 PM 10/01/2021    2:49 PM 07/02/2021    2:26 PM 11/19/2019    9:11 AM  Depression screen PHQ 2/9  Decreased Interest 0 1 0 0 1 1 2   Down, Depressed, Hopeless 0 1 0 0 3 1 2   PHQ - 2 Score 0 2 0 0 4 2 4   Altered sleeping       2  Tired, decreased energy       3  Change in appetite       2  Feeling bad or failure about yourself        2  Trouble concentrating       2  Moving slowly or fidgety/restless       2  Suicidal thoughts       0  PHQ-9 Score       17  Difficult doing work/chores       Very difficult    Review of Systems  Musculoskeletal:  Positive for back pain.       PAIN IN LEFT LEG, PAIN IN BOTH HIPS, LEFT RIB AREA  Neurological:  Positive for headaches.  All other systems reviewed and are negative.      Objective:   Physical Exam        Assessment & Plan:  Lumbar Radiculitis: Continue Gabapentin. Continue HEP as Tolerated . Continue to Monitor. 10/13/2023 2. Chronic Post Thoracotomy Pain: No complaints  today. Continue current medication regimen. Continue to monitor. 10/13/2023 3. Chronic Pain Syndrome: Continue : Xtampza 36 mg every 12 hours #60 and Oxycodone 5mg  three times a day as needed for pain #90.  We will continue the opioid monitoring program, this consists of regular clinic visits, examinations, urine drug screen, pill counts as well as use of [place] Controlled Substance Reporting system. A 12 month History has been reviewed on the [place] Controlled Substance Reporting System on 10/13/2023.  3. Myofascial Pain: No complaints today. Continue current medication regimen. Continue to Monitor. 10/13/2023 4. Anxiety: No Complaints today.   Patient reported past history with PTSD. Continue to monitor. 10/13/2023   F/U in 2 months

## 2023-12-23 ENCOUNTER — Encounter: Admitting: Registered Nurse

## 2023-12-24 NOTE — Progress Notes (Signed)
 Subjective:    Patient ID: [name], male    DOB: 17-Apr-1976, 48 y.o.   MRN: [id number]  HPI: [name] is a 48 y.o. male who returns for follow up appointment for chronic pain and medication refill. He states his pain is located in his mid- lower back radiating into her bilateral lower extremities. He rates his pain 9. His current exercise regime is walking and performing stretching exercises.  [name] Morphine equivalent is 120.00 MME.   Oral Swab was Performed today.    Pain Inventory Average Pain 9 Pain Right Now 9 My pain is constant, sharp, burning, dull, stabbing, tingling, and aching  In the last 24 hours, has pain interfered with the following? General activity 8 Relation with others 8 Enjoyment of life 8 What TIME of day is your pain at its worst? morning , daytime, evening, and night Sleep (in general) Poor  Pain is worse with: walking, bending, standing, and some activites Pain improves with: rest and medication Relief from Meds: 6  Family History  Problem Relation Age of Onset   Alcohol abuse Mother    Cirrhosis Mother    Cirrhosis Father    Colitis Father    Heart disease Father    Asthma Father    Other Father        Chemical Imbalance   Colon polyps Sister        intestinal problems   Other Brother        Intestinal Fissure   Diabetes Maternal Grandfather    Prostate cancer Paternal Grandfather    Asthma Son    Heart attack Other        Paternal Grandparents   Stroke Other        Paternal Grandparents   Social History   Socioeconomic History   Marital status: Divorced    Spouse name: Not on file   Number of children: 3   Years of education: Not on file   Highest education level: Not on file  Occupational History   Not on file  Tobacco Use   Smoking status: Every Day    Current packs/day: 1.00    Average packs/day: 1 pack/day for 24.0 years (24.0 ttl pk-yrs)    Types: Cigarettes   Smokeless tobacco: Never  Vaping Use    Vaping status: Every Day  Substance and Sexual Activity   Alcohol use: Not Currently    Alcohol/week: 0.0 standard drinks of alcohol   Drug use: No   Sexual activity: Not on file  Other Topics Concern   Not on file  Social History Narrative   ** Merged History Encounter **          Right handed    Lives on 2nd floor   Social Drivers of Health   Financial Resource Strain: Not on file  Food Insecurity: No Food Insecurity (06/02/2023)   Hunger Vital Sign    Worried About Running Out of Food in the Last Year: Never true    Ran Out of Food in the Last Year: Never true  Transportation Needs: No Transportation Needs (06/02/2023)   PRAPARE - Administrator, Civil Service (Medical): No    Lack of Transportation (Non-Medical): No  Physical Activity: Not on file  Stress: Not on file  Social Connections: Not on file   Past Surgical History:  Procedure Laterality Date   ABLATION     Lumbar   COLONOSCOPY WITH PROPOFOL N/A 08/19/2013   Procedure: COLONOSCOPY WITH PROPOFOL;  Surgeon: Rachael Fee, MD;  Location: [facility];  Service: Endoscopy;  Laterality: N/A;   ESOPHAGOGASTRODUODENOSCOPY (EGD) WITH PROPOFOL N/A 08/19/2013   Procedure: ESOPHAGOGASTRODUODENOSCOPY (EGD) WITH PROPOFOL;  Surgeon: Rachael Fee, MD;  [facility];  Service: Endoscopy;  Laterality: N/A;   INTUBATION-ENDOTRACHEAL WITH TRACHEOSTOMY STANDBY N/A 05/07/2023   Procedure: INTUBATION-ENDOTRACHEAL WITH TRACHEOSTOMY STANDBY;  Surgeon: Diamantina Monks, MD;  Location[facility];  Service: General;  Laterality: N/A;   IR RADIOLOGIST EVAL & MGMT  05/20/2019   KNEE ARTHROSCOPY Left 07/13/2020   Procedure: left knee arthroscopy, debridement, cyst decompression;  Surgeon: Cammy Copa, MD;  Location: [facility];  Service: Orthopedics;  Laterality: Left;   ORIF MANDIBULAR FRACTURE N/A 02/16/2016   Procedure: OPEN REDUCTION INTERNAL FIXATION (ORIF) MANDIBULAR FRACTURE;  Surgeon: Suzanna Obey, MD;  Location: [facility] OR;  Service: ENT;  Laterality: N/A;   RIB PLATING Left 02/20/2016   Procedure: LEFT RIB PLATING;  Surgeon: Kerin Perna, MD;  Location: [facility] OR;  Service: Thoracic;  Laterality: Left;   ROBOTIC ASSITED PARTIAL NEPHRECTOMY Left 06/17/2013   Procedure: ROBOTIC ASSITED PARTIAL NEPHRECTOMY;  Surgeon: Crecencio Mc, MD;  [facility];  Service: Urology;  Laterality: Left;   TRACHEOSTOMY TUBE PLACEMENT N/A 02/16/2016   Procedure: TRACHEOSTOMY;  Surgeon: Suzanna Obey, MD;  Location: [facility] OR;  Service: ENT;  Laterality: N/A;   VASECTOMY  10/21/2010   WISDOM TOOTH EXTRACTION  middle school   WRIST SURGERY Left middle school   "arteries and nerves tangled up"   Past Surgical History:  Procedure Laterality Date   ABLATION     Lumbar   COLONOSCOPY WITH PROPOFOL N/A 08/19/2013   Procedure: COLONOSCOPY WITH PROPOFOL;  Surgeon: Rachael Fee, MD;  [facility];  Service: Endoscopy;  Laterality: N/A;   ESOPHAGOGASTRODUODENOSCOPY (EGD) WITH PROPOFOL N/A 08/19/2013   Procedure: ESOPHAGOGASTRODUODENOSCOPY (EGD) WITH PROPOFOL;  Surgeon: Rachael Fee, MD;  [facility];  Service: Endoscopy;  Laterality: N/A;   INTUBATION-ENDOTRACHEAL WITH TRACHEOSTOMY STANDBY N/A 05/07/2023   Procedure: INTUBATION-ENDOTRACHEAL WITH TRACHEOSTOMY STANDBY;  Surgeon: Diamantina Monks, MD;  Location[facility];  Service: General;  Laterality: N/A;   IR RADIOLOGIST EVAL & MGMT  05/20/2019   KNEE ARTHROSCOPY Left 07/13/2020   Procedure: left knee arthroscopy, debridement, cyst decompression;  Surgeon: Cammy Copa, MD;  Location: [facility];  Service: Orthopedics;  Laterality: Left;   ORIF MANDIBULAR FRACTURE N/A 02/16/2016   Procedure: OPEN REDUCTION INTERNAL FIXATION (ORIF) MANDIBULAR FRACTURE;  Surgeon: Suzanna Obey, MD;  Location: [facility] OR;  Service: ENT;  Laterality: N/A;   RIB PLATING Left 02/20/2016   Procedure: LEFT RIB PLATING;  Surgeon: Kerin Perna, MD;   Location: [facility] OR;  Service: Thoracic;  Laterality: Left;   ROBOTIC ASSITED PARTIAL NEPHRECTOMY Left 06/17/2013   Procedure: ROBOTIC ASSITED PARTIAL NEPHRECTOMY;  Surgeon: Crecencio Mc, MD;  [facility];  Service: Urology;  Laterality: Left;   TRACHEOSTOMY TUBE PLACEMENT N/A 02/16/2016   Procedure: TRACHEOSTOMY;  Surgeon: Suzanna Obey, MD;  Location: [facility] OR;  Service: ENT;  Laterality: N/A;   VASECTOMY  10/21/2010   WISDOM TOOTH EXTRACTION  middle school   WRIST SURGERY Left middle school   "arteries and nerves tangled up"   Past Medical History:  Diagnosis Date   Anxiety and depression 08/01/2013   Arthritis    Asthma    Cellulitis    left leg and stomach   Chicken pox    Chronic pain  Diarrhea 08/01/2013   History of kidney cancer    Hx of vasectomy    Hypertension    Renal cell carcinoma (HCC) 06/01/2013   There were no vitals taken for this visit.  Opioid Risk Score:   Fall Risk Score:  `1  Depression screen [facility] 2/9     10/13/2023    9:43 AM 06/04/2023    1:20 PM 09/16/2022    1:51 PM 06/28/2022    2:49 PM 10/01/2021    2:49 PM 07/02/2021    2:26 PM 11/19/2019    9:11 AM  Depression screen PHQ 2/9  Decreased Interest 0 1 0 0 1 1 2   Down, Depressed, Hopeless 0 1 0 0 3 1 2   PHQ - 2 Score 0 2 0 0 4 2 4   Altered sleeping       2  Tired, decreased energy       3  Change in appetite       2  Feeling bad or failure about yourself        2  Trouble concentrating       2  Moving slowly or fidgety/restless       2  Suicidal thoughts       0  PHQ-9 Score       17  Difficult doing work/chores       Very difficult    Review of Systems  Musculoskeletal:  Positive for back pain.       PAIN IN LEFT LEG, PAIN IN BOTH HIPS, LEFT RIB AREA  Neurological:  Positive for headaches.  All other systems reviewed and are negative.      Objective:   Physical Exam Vitals and nursing note reviewed.  Constitutional:      Appearance: Normal appearance. He is obese.  Cardiovascular:      Rate and Rhythm: Normal rate and regular rhythm.     Pulses: Normal pulses.     Heart sounds: Normal heart sounds.  Pulmonary:     Effort: Pulmonary effort is normal.     Breath sounds: Normal breath sounds.  Musculoskeletal:     Comments: Normal Muscle Bulk and Muscle Testing Reveals:  Upper Extremities: Full ROM and Muscle Strength 5/5 Lumbar Paraspinal Tenderness: L-4- L-5 Bilateral Greater Trochanter Tenderness Lower Extremities: Full ROM and Muscle Strength 5/5 Arises from Table Slowly Narrow Based  Gait     Skin:    General: Skin is warm and dry.  Neurological:     Mental Status: He is alert and oriented to person, place, and time.  Psychiatric:        Mood and Affect: Mood normal.        Behavior: Behavior normal.         Assessment & Plan:  Lumbar Radiculitis: Continue Gabapentin. Continue HEP as Tolerated . Continue to Monitor. 12/25/2023 2. Chronic Post Thoracotomy Pain: No complaints today. Continue current medication regimen. Continue to monitor. 12/25/2023 3. Chronic Pain Syndrome: Refilled: Morphine 30 mg every 12 hours #60 and Oxycodone 10 mg one tablet 4  times a day as needed for pain #120.  We will continue the opioid monitoring program, this consists of regular clinic visits, examinations, urine drug screen, pill counts as well as use of [place] Controlled Substance Reporting system. A 12 month History has been reviewed on the [place] Controlled Substance Reporting System on 12/25/2023.  3. Myofascial Pain: NContinue current medication regimen. Continue to Monitor. 12/25/2023 4. Anxiety: No Complaints today.  Patient reported past history with PTSD. Continue to monitor. 12/25/2023   F/U in 2 months

## 2023-12-25 ENCOUNTER — Encounter: Payer: Self-pay | Admitting: Registered Nurse

## 2023-12-25 ENCOUNTER — Encounter: Attending: Registered Nurse | Admitting: Registered Nurse

## 2023-12-25 ENCOUNTER — Telehealth: Payer: Self-pay | Admitting: Registered Nurse

## 2023-12-25 VITALS — BP 114/78 | HR 91 | Ht 71.0 in | Wt 329.0 lb

## 2023-12-25 DIAGNOSIS — G894 Chronic pain syndrome: Secondary | ICD-10-CM | POA: Insufficient documentation

## 2023-12-25 DIAGNOSIS — Z79891 Long term (current) use of opiate analgesic: Secondary | ICD-10-CM | POA: Diagnosis present

## 2023-12-25 DIAGNOSIS — M546 Pain in thoracic spine: Secondary | ICD-10-CM | POA: Insufficient documentation

## 2023-12-25 DIAGNOSIS — G8929 Other chronic pain: Secondary | ICD-10-CM | POA: Insufficient documentation

## 2023-12-25 DIAGNOSIS — Z5181 Encounter for therapeutic drug level monitoring: Secondary | ICD-10-CM | POA: Insufficient documentation

## 2023-12-25 DIAGNOSIS — M5416 Radiculopathy, lumbar region: Secondary | ICD-10-CM | POA: Insufficient documentation

## 2023-12-25 MED ORDER — OXYCODONE HCL 10 MG PO TABS: 10.0000 mg | ORAL_TABLET | Freq: Four times a day (QID) | ORAL | 0 refills | Status: DC | PRN

## 2023-12-25 MED ORDER — MORPHINE SULFATE ER 30 MG PO TBCR: 30.0000 mg | EXTENDED_RELEASE_TABLET | Freq: Two times a day (BID) | ORAL | 0 refills | Status: DC

## 2023-12-25 NOTE — Telephone Encounter (Signed)
 Patient called in states PA is needed for Oxycodone 10mg  and is out of medication

## 2023-12-26 ENCOUNTER — Other Ambulatory Visit: Payer: Self-pay

## 2023-12-26 ENCOUNTER — Emergency Department (HOSPITAL_COMMUNITY)

## 2023-12-26 ENCOUNTER — Emergency Department (HOSPITAL_COMMUNITY)
Admission: EM | Admit: 2023-12-26 | Discharge: 2023-12-26 | Disposition: A | Discharge status: Home / Self Care | Attending: Emergency Medicine | Admitting: Emergency Medicine

## 2023-12-26 ENCOUNTER — Encounter (HOSPITAL_COMMUNITY): Payer: Self-pay | Admitting: *Deleted

## 2023-12-26 DIAGNOSIS — Z85528 Personal history of other malignant neoplasm of kidney: Secondary | ICD-10-CM | POA: Diagnosis not present

## 2023-12-26 DIAGNOSIS — R0602 Shortness of breath: Secondary | ICD-10-CM | POA: Insufficient documentation

## 2023-12-26 DIAGNOSIS — R1110 Vomiting, unspecified: Secondary | ICD-10-CM | POA: Insufficient documentation

## 2023-12-26 DIAGNOSIS — Z79899 Other long term (current) drug therapy: Secondary | ICD-10-CM | POA: Insufficient documentation

## 2023-12-26 DIAGNOSIS — R0789 Other chest pain: Secondary | ICD-10-CM | POA: Diagnosis not present

## 2023-12-26 DIAGNOSIS — I10 Essential (primary) hypertension: Secondary | ICD-10-CM | POA: Diagnosis not present

## 2023-12-26 DIAGNOSIS — R1013 Epigastric pain: Secondary | ICD-10-CM | POA: Diagnosis not present

## 2023-12-26 DIAGNOSIS — J45909 Unspecified asthma, uncomplicated: Secondary | ICD-10-CM | POA: Insufficient documentation

## 2023-12-26 DIAGNOSIS — R1012 Left upper quadrant pain: Secondary | ICD-10-CM | POA: Diagnosis not present

## 2023-12-26 LAB — HEPATIC FUNCTION PANEL
ALT: 23 U/L (ref 0–44)
AST: 22 U/L (ref 15–41)
Albumin: 3.8 g/dL (ref 3.5–5.0)
Alkaline Phosphatase: 55 U/L (ref 38–126)
Bilirubin, Direct: <0.1 mg/dL (ref 0.0–0.2)
Total Bilirubin: 0.5 mg/dL (ref 0.0–1.2)
Total Protein: 6.6 g/dL (ref 6.5–8.1)

## 2023-12-26 LAB — CBC
HCT: 42.6 % (ref 39.0–52.0)
Hemoglobin: 14.3 g/dL (ref 13.0–17.0)
MCH: 29.9 pg (ref 26.0–34.0)
MCHC: 33.6 g/dL (ref 30.0–36.0)
MCV: 88.9 fL (ref 80.0–100.0)
Platelets: 289 10*3/uL (ref 150–400)
RBC: 4.79 MIL/uL (ref 4.22–5.81)
RDW: 13.1 % (ref 11.5–15.5)
WBC: 9 10*3/uL (ref 4.0–10.5)
nRBC: 0 % (ref 0.0–0.2)

## 2023-12-26 LAB — BASIC METABOLIC PANEL
Anion gap: 11 (ref 5–15)
BUN: 24 mg/dL — ABNORMAL HIGH (ref 6–20)
CO2: 26 mmol/L (ref 22–32)
Calcium: 9.5 mg/dL (ref 8.9–10.3)
Chloride: 103 mmol/L (ref 98–111)
Creatinine, Ser: 1.1 mg/dL (ref 0.61–1.24)
GFR, Estimated: >60 mL/min (ref >60)
Glucose, Bld: 125 mg/dL — ABNORMAL HIGH (ref 70–99)
Potassium: 3.9 mmol/L (ref 3.5–5.1)
Sodium: 140 mmol/L (ref 135–145)

## 2023-12-26 LAB — LIPASE, BLOOD: Lipase: 27 U/L (ref 11–51)

## 2023-12-26 LAB — TROPONIN I (HIGH SENSITIVITY): Troponin I (High Sensitivity): 5 ng/L (ref <18)

## 2023-12-26 MED ORDER — OXYCODONE HCL 5 MG PO TABS
5.0000 mg | ORAL_TABLET | Freq: Once | ORAL | Status: AC
  Administered 2023-12-26: 5 mg via ORAL
  Filled 2023-12-26: qty 1

## 2023-12-26 MED ORDER — ONDANSETRON HCL 4 MG/2ML IJ SOLN
4.0000 mg | Freq: Once | INTRAMUSCULAR | Status: AC
  Administered 2023-12-26: 4 mg via INTRAVENOUS
  Filled 2023-12-26: qty 2

## 2023-12-26 MED ORDER — DIPHENHYDRAMINE HCL 50 MG/ML IJ SOLN
25.0000 mg | Freq: Once | INTRAMUSCULAR | Status: AC
  Administered 2023-12-26: 25 mg via INTRAVENOUS
  Filled 2023-12-26: qty 1

## 2023-12-26 MED ORDER — FENTANYL CITRATE PF 50 MCG/ML IJ SOSY
50.0000 ug | PREFILLED_SYRINGE | Freq: Once | INTRAMUSCULAR | Status: AC
  Administered 2023-12-26: 50 ug via INTRAVENOUS
  Filled 2023-12-26: qty 1

## 2023-12-26 MED ORDER — SODIUM CHLORIDE 0.9 % IV BOLUS (SEPSIS)
1000.0000 mL | Freq: Once | INTRAVENOUS | Status: AC
  Administered 2023-12-26: 1000 mL via INTRAVENOUS

## 2023-12-26 MED ORDER — IOHEXOL 350 MG/ML SOLN
75.0000 mL | Freq: Once | INTRAVENOUS | Status: AC | PRN
  Administered 2023-12-26: 75 mL via INTRAVENOUS

## 2023-12-26 MED ORDER — FENTANYL CITRATE PF 50 MCG/ML IJ SOSY
100.0000 ug | PREFILLED_SYRINGE | Freq: Once | INTRAMUSCULAR | Status: AC
  Administered 2023-12-26: 100 ug via INTRAVENOUS
  Filled 2023-12-26: qty 2

## 2023-12-26 MED ORDER — METOCLOPRAMIDE HCL 5 MG/ML IJ SOLN
10.0000 mg | Freq: Once | INTRAMUSCULAR | Status: AC
  Administered 2023-12-26: 10 mg via INTRAVENOUS
  Filled 2023-12-26: qty 2

## 2023-12-26 NOTE — Telephone Encounter (Signed)
 Patient called and LVM , says he contacted insurance and pharmacy and they need to know that medication is ongoing and not just a one time thing, patient states he is leaving out of town this weekend and needs assistance with medication

## 2023-12-26 NOTE — ED Triage Notes (Signed)
 Pt reports pain in the center of his sternum, goes straight through his back and on the left side. Pt was sitting on couch at time of onset. He was having nausea and took tums for the pain, without relief. Pain worse to take a deep breath.

## 2023-12-26 NOTE — Discharge Instructions (Signed)
   SEEK IMMEDIATE MEDICAL ATTENTION IF: The pain does not go away or becomes severe, particularly over the next 8-12 hours.  A temperature above 100.75F develops.  Repeated vomiting occurs (multiple episodes).  The pain becomes localized to portions of the abdomen. The right side could possibly be appendicitis. In an adult, the left lower portion of the abdomen could be colitis or diverticulitis.  Blood is being passed in stools or vomit (bright red or black tarry stools).  Return also if you develop chest pain, difficulty breathing, dizziness or fainting, or become confused, poorly responsive, or inconsolable.

## 2023-12-26 NOTE — ED Provider Notes (Signed)
 [facility] AT [facility] Provider Note   CSN: [id number] Arrival date & time: 12/26/23  0240     History  Chief Complaint  Patient presents with   Chest Pain    [name] is a 48 y.o. male.   Chest Pain Associated symptoms: vomiting   Associated symptoms: no fever   Patient with history of hypertension, chronic pain presents for upper abdominal pain and left-sided chest wall pain.  Patient reports previous surgical fixation of left rib fractures. He reports several hours ago he began having pain in his upper abdomen and around his left flank.  He reports associated nonbloody emesis.  He reports shortness of breath.  This pain is worse than typical pain for him. Patient reports pain is worse with palpation and deep breathing  No trauma No urinary symptoms Past Medical History:  Diagnosis Date   Anxiety and depression 08/01/2013   Arthritis    Asthma    Cellulitis    left leg and stomach   Chicken pox    Chronic pain    Diarrhea 08/01/2013   History of kidney cancer    Hx of vasectomy    Hypertension    Renal cell carcinoma (HCC) 06/01/2013    Home Medications Prior to Admission medications   Medication Sig Start Date End Date Taking? Authorizing Provider  albuterol (VENTOLIN HFA) 108 (90 Base) MCG/ACT inhaler Inhale 1-2 puffs into the lungs every 6 (six) hours as needed for wheezing or shortness of breath. 04/10/23   Kozlow, Alvira Philips, MD  ALPRAZolam Prudy Feeler) 1 MG tablet Take 1 tablet (1 mg total) by mouth 3 (three) times daily as needed for up to 30 doses for anxiety. 06/03/23   Lorin Glass, MD  AMBULATORY NON FORMULARY MEDICATION Apply 1 Application topically 2 (two) times daily. Medication Name: Nitroglycerin 0.125% gel. Apply pea size amount to the rectum two times a day as needed for 6 weeks 06/13/23   Jenel Lucks, MD  cetirizine (ZYRTEC) 10 MG tablet 2 tablets 2 times per day Patient taking differently: Take 20 mg by mouth  2 (two) times daily. 04/10/23   Kozlow, Alvira Philips, MD  EC-NAPROXEN 500 MG EC tablet Take 1 tablet (500 mg total) by mouth 2 (two) times daily. 07/18/23   Lovorn, Aundra Millet, MD  EPINEPHrine 0.3 mg/0.3 mL IJ SOAJ injection Inject 0.3 mg into the muscle as needed for anaphylaxis. As needed for life-threatening allergic reactions 05/11/23   Rai, Delene Ruffini, MD  famotidine (PEPCID) 40 MG tablet TAKE 1 TABLET BY MOUTH TWICE A DAY 09/01/23   Kozlow, Alvira Philips, MD  fluticasone (FLONASE) 50 MCG/ACT nasal spray Place 1 spray into both nostrils at bedtime. 05/23/20   [provider]  gabapentin (NEURONTIN) 300 MG capsule TAKE 3 CAPSULES IN THE MORNING, 3 CAPSULES AT LUNCH, AND 3 CAPSULES ARE BEDTIME 09/15/23   Lovorn, Megan, MD  lidocaine (LIDODERM) 5 % PLACE 3 PATCHES ONTO THE SKIN DAILY. REMOVE & DISCARD PATCH WITHIN 12 HOURS OR AS DIRECTED BY MD Patient taking differently: Place 3 patches onto the skin daily. 12/16/22   Kirsteins, Victorino Sparrow, MD  meclizine (ANTIVERT) 25 MG tablet Take 25 mg by mouth daily as needed for dizziness or nausea. 04/25/23   [provider]  metoprolol succinate (TOPROL-XL) 100 MG 24 hr tablet Take 100 mg by mouth daily. 03/07/21   [provider]  montelukast (SINGULAIR) 10 MG tablet Take 1 tablet (10 mg total) by mouth at bedtime.  05/11/23   Rai, Ripudeep Kirtland Bouchard, MD  morphine (MS CONTIN) 30 MG 12 hr tablet Take 1 tablet (30 mg total) by mouth every 12 (twelve) hours. 12/25/23   Jones Bales, NP  Oxycodone HCl 10 MG TABS Take 1 tablet (10 mg total) by mouth 4 (four) times daily as needed. 12/25/23   Jones Bales, NP  phentermine (ADIPEX-P) 37.5 MG tablet Take 18.75 mg by mouth every morning. 08/19/22   [provider]  promethazine (PHENERGAN) 25 MG tablet Take 25 mg by mouth every 6 (six) hours as needed for nausea or vomiting.    [provider]  QUEtiapine (SEROQUEL XR) 50 MG TB24 24 hr tablet Take 50 mg by mouth. 04/20/20   [provider]   sertraline (ZOLOFT) 100 MG tablet Take 100 mg by mouth daily. 06/25/22   [provider]  sildenafil (REVATIO) 20 MG tablet Take 20 mg by mouth daily as needed (for ED). 05/23/23   [provider]  SUMAtriptan (IMITREX) 25 MG tablet Take 25 mg by mouth daily as needed for migraine or headache. 04/28/23   [provider]  Testosterone 1.62 % GEL Apply 40.5 mg topically daily. 06/10/23   [provider]  triamcinolone cream (KENALOG) 0.1 % Apply 1 Application topically 2 (two) times daily as needed (for rash). 04/04/23   [provider]      Allergies    Bee venom and Hornet venom    Review of Systems   Review of Systems  Constitutional:  Negative for fever.  Cardiovascular:  Positive for chest pain.  Gastrointestinal:  Positive for vomiting.    Physical Exam Updated Vital Signs BP 104/61   Pulse 84   Temp (!) 97.5 F (36.4 C) (Oral)   Resp 16   Ht 1.88 m (6\' 2" )   Wt (!) 145.2 kg   SpO2 95%   BMI 41.09 kg/m  Physical Exam CONSTITUTIONAL: Well developed/well nourished, anxious HEAD: Normocephalic/atraumatic EYES: EOMI/PERRL ENMT: Mucous membranes moist NECK: supple no meningeal signs SPINE/BACK:entire spine nontender CV: S1/S2 noted, no murmurs/rubs/gallops noted LUNGS: Lungs are clear to auscultation bilaterally, no apparent distress Chest-diffuse left-sided chest wall tenderness, healed scar noted, no crepitus ABDOMEN: soft, moderate epigastric left upper quadrant tenderness, no rebound or guarding, bowel sounds noted throughout abdomen, no bruising or rash noted GU:no cva tenderness NEURO: Pt is awake/alert/appropriate, moves all extremitiesx4.  No facial droop.   EXTREMITIES: pulses normal/equalx4, full ROM SKIN: warm, color normal PSYCH: Anxious  ED Results / Procedures / Treatments   Labs (all labs ordered are listed, but only abnormal results are displayed) Labs Reviewed  BASIC METABOLIC PANEL - Abnormal; Notable for the  following components:      Result Value   Glucose, Bld 125 (*)    BUN 24 (*)    All other components within normal limits  CBC  LIPASE, BLOOD  HEPATIC FUNCTION PANEL  TROPONIN I (HIGH SENSITIVITY)    EKG EKG Interpretation Date/Time:  Friday December 26 2023 02:46:15 EST Ventricular Rate:  97 PR Interval:  166 QRS Duration:  107 QT Interval:  348 QTC Calculation: 442 R Axis:   89  Text Interpretation: Sinus rhythm Inferior infarct, old No significant change since last tracing Confirmed by Zadie Rhine ([id number]) on 12/26/2023 2:48:53 AM  Radiology CT ABDOMEN PELVIS W CONTRAST Result Date: 12/26/2023 CLINICAL DATA:  Abdominal pain with nausea vomiting. EXAM: CT ABDOMEN AND PELVIS WITH CONTRAST TECHNIQUE: Multidetector CT imaging of the abdomen and pelvis was performed  using the standard protocol following bolus administration of intravenous contrast. RADIATION DOSE REDUCTION: This exam was performed according to the departmental dose-optimization program which includes automated exposure control, adjustment of the mA and/or kV according to patient size and/or use of iterative reconstruction technique. CONTRAST:  75mL OMNIPAQUE IOHEXOL 350 MG/ML SOLN COMPARISON:  01/08/2023 FINDINGS: Lower chest: Deformity inferior left chest wall secondary to multiple rib fracture status post ORIF. Hepatobiliary: A tiny hypodensity in the posterior right liver parenchyma is too small to characterize but is stable and statistically most likely benign. No followup imaging is recommended. Gallbladder is decompressed. No intrahepatic or extrahepatic biliary dilation. Pancreas: No focal mass lesion. No dilatation of the main duct. No intraparenchymal cyst. No peripancreatic edema. Spleen: No splenomegaly. No suspicious focal mass lesion. Adrenals/Urinary Tract: No adrenal nodule or mass. Cortical scarring noted interpolar left kidney. Right kidney unremarkable. No evidence for hydroureter. The urinary bladder appears  normal for the degree of distention. Stomach/Bowel: Stomach is moderately distended with food and fluid. Duodenum is normally positioned as is the ligament of Treitz. No small bowel wall thickening. No small bowel dilatation. The terminal ileum is normal. The appendix is normal. No gross colonic mass. No colonic wall thickening. Vascular/Lymphatic: No abdominal aortic aneurysm. No abdominal aortic atherosclerotic calcification. There is no gastrohepatic or hepatoduodenal ligament lymphadenopathy. No retroperitoneal or mesenteric lymphadenopathy. No pelvic sidewall lymphadenopathy. Reproductive: The prostate gland and seminal vesicles are unremarkable. Other: No intraperitoneal free fluid. Musculoskeletal: No worrisome lytic or sclerotic osseous abnormality. IMPRESSION: No acute findings in the abdomen or pelvis. Specifically, no findings to explain the patient's history of abdominal pain with nausea and vomiting Electronically Signed   By: Kennith Center M.D.   On: 12/26/2023 06:28   DG Chest Portable 1 View Result Date: 12/26/2023 CLINICAL DATA:  Chest pain EXAM: PORTABLE CHEST 1 VIEW COMPARISON:  07/28/2023 FINDINGS: The lungs are clear without focal pneumonia, edema, pneumothorax or pleural effusion. Cardiopericardial silhouette is at upper limits of normal for size. Pleuroparenchymal scarring noted left base with overlying fixation hardware in multiple ribs. Telemetry leads overlie the chest. IMPRESSION: No acute cardiopulmonary findings. Electronically Signed   By: Kennith Center M.D.   On: 12/26/2023 06:23    Procedures Procedures    Medications Ordered in ED Medications  oxyCODONE (Oxy IR/ROXICODONE) immediate release tablet 5 mg (has no administration in time range)  fentaNYL (SUBLIMAZE) injection 100 mcg (100 mcg Intravenous Given 12/26/23 0309)  ondansetron (ZOFRAN) injection 4 mg (4 mg Intravenous Given 12/26/23 0309)  fentaNYL (SUBLIMAZE) injection 50 mcg (50 mcg Intravenous Given 12/26/23 0446)   ondansetron (ZOFRAN) injection 4 mg (4 mg Intravenous Given 12/26/23 0446)  sodium chloride 0.9 % bolus 1,000 mL (0 mLs Intravenous Stopped 12/26/23 0637)  iohexol (OMNIPAQUE) 350 MG/ML injection 75 mL (75 mLs Intravenous Contrast Given 12/26/23 0505)  metoCLOPramide (REGLAN) injection 10 mg (10 mg Intravenous Given 12/26/23 0654)  diphenhydrAMINE (BENADRYL) injection 25 mg (25 mg Intravenous Given 12/26/23 0653)    ED Course/ Medical Decision Making/ A&P Clinical Course as of 12/26/23 0711  Fri Dec 26, 2023  0439 Patient somewhat improved but still having pain.  Reports the rib pain is chronic, but the pain in his upper abdomen and left upper quadrant is new and he still has tenderness on exam.  Will obtain CT imaging. [DW]  1478 CT imaging does not reveal any acute process. Most of the patient's pain is in his left upper quadrant, though due to his chronic pain in his ribs  it is unclear how acute this is.  He is in no acute distress.  Now reporting a headache. He is also reporting his home pain medications. He is also requesting referral to a new CT surgeon [DW]  [phone] Overall patient safe for discharge home.  I have low suspicion for ACS/PE/dissection at this time  Will refer to GI [DW]    Clinical Course User Index [DW] Zadie Rhine, MD                                 Medical Decision Making Amount and/or Complexity of Data Reviewed Labs: ordered. Radiology: ordered.  Risk Prescription drug management.   This patient presents to the ED for concern of chest and abdominal pain, this involves an extensive number of treatment options, and is a complaint that carries with it a high risk of complications and morbidity.  The differential diagnosis includes but is not limited to acute coronary syndrome, aortic dissection, pulmonary embolism, pericarditis, pneumothorax, pneumonia, myocarditis, pleurisy, esophageal rupture Cholelithiasis, cholecystitis  Comorbidities that complicate the  patient evaluation: Patient's presentation is complicated by their history of hypertension  Social Determinants of Health: Patient's  chronic pain   increases the complexity of managing their presentation  Additional history obtained: Records reviewed  outpatient records reviewed  Lab Tests: I Ordered, and personally interpreted labs.  The pertinent results include: Labs unremarkable  Imaging Studies ordered: I ordered imaging studies including X-ray chest   I independently visualized and interpreted imaging which showed no acute findings I agree with the radiologist interpretation  Medicines ordered and prescription drug management: I ordered medication including fentanyl for pain Reevaluation of the patient after these medicines showed that the patient    improved   Reevaluation: After the interventions noted above, I reevaluated the patient and found that they have :improved  Complexity of problems addressed: Patient's presentation is most consistent with  acute presentation with potential threat to life or bodily function  Disposition: After consideration of the diagnostic results and the patient's response to treatment,  I feel that the patent would benefit from discharge   .    Overall patient appears improved, he is sitting up at the side of the bed. Will give dose of his home chronic pain medicine.  Will also treat his headache Patient safe for discharge home       Final Clinical Impression(s) / ED Diagnoses Final diagnoses:  Chest wall pain  LUQ abdominal pain    Rx / DC Orders ED Discharge Orders     None         Zadie Rhine, MD 12/26/23 [phone]

## 2023-12-26 NOTE — ED Triage Notes (Signed)
 Pt arrives via EMS, coming from home. Per report, about 2200 onset of CP that has become gradually worse, along diaphragm, sharp. Lung sounds clear. Nausea. Zofran 4mg  given. 12 lead wnl, hr 80, 96 RA, 150/100.

## 2023-12-26 NOTE — Telephone Encounter (Signed)
 [facility] in [place] is calling about prior auth for patient's oxycodone.  Please call them at [phone].

## 2023-12-29 ENCOUNTER — Telehealth: Payer: Self-pay | Admitting: Registered Nurse

## 2023-12-29 NOTE — Telephone Encounter (Signed)
 PMP was reviewed viewed.  MS Contin was filled on 12/25/2023 Sybil RN, submitted to PA last week.  Call place to Mr. Mierzwa, he states pharmacist stating we have to call his insurance company, to override the prescription request. Will have Sybil RN call his insurance regarding the above, he verbalizes understanding.

## 2023-12-30 LAB — DRUG TOX MONITOR 1 W/CONF, ORAL FLD
Amphetamines: NEGATIVE ng/mL (ref <10)
Barbiturates: NEGATIVE ng/mL (ref <10)
Benzodiazepines: NEGATIVE ng/mL (ref <0.50)
Buprenorphine: NEGATIVE ng/mL (ref <0.10)
Cocaine: NEGATIVE ng/mL (ref <5.0)
Codeine: NEGATIVE ng/mL (ref <2.5)
Cotinine: >250 ng/mL — ABNORMAL HIGH (ref <5.0)
Dihydrocodeine: NEGATIVE ng/mL (ref <2.5)
Fentanyl: NEGATIVE ng/mL (ref <0.10)
Heroin Metabolite: NEGATIVE ng/mL (ref <1.0)
Hydrocodone: NEGATIVE ng/mL (ref <2.5)
Hydromorphone: NEGATIVE ng/mL (ref <2.5)
MARIJUANA: NEGATIVE ng/mL (ref <2.5)
MDMA: NEGATIVE ng/mL (ref <10)
Meprobamate: NEGATIVE ng/mL (ref <2.5)
Methadone: NEGATIVE ng/mL (ref <5.0)
Morphine: 2.7 ng/mL — ABNORMAL HIGH (ref <2.5)
Nicotine Metabolite: POSITIVE ng/mL — AB (ref <5.0)
Norhydrocodone: NEGATIVE ng/mL (ref <2.5)
Noroxycodone: 14.8 ng/mL — ABNORMAL HIGH (ref <2.5)
Opiates: POSITIVE ng/mL — AB (ref <2.5)
Oxycodone: 55.4 ng/mL — ABNORMAL HIGH (ref <2.5)
Oxymorphone: NEGATIVE ng/mL (ref <2.5)
Phencyclidine: NEGATIVE ng/mL (ref <10)
Tapentadol: NEGATIVE ng/mL (ref <5.0)
Tramadol: NEGATIVE ng/mL (ref <5.0)
Zolpidem: NEGATIVE ng/mL (ref <5.0)

## 2023-12-30 LAB — DRUG TOX ALC METAB W/CON, ORAL FLD: Alcohol Metabolite: NEGATIVE ng/mL (ref <25)

## 2024-01-20 ENCOUNTER — Telehealth: Payer: Self-pay | Admitting: Physical Medicine and Rehabilitation

## 2024-01-20 DIAGNOSIS — G894 Chronic pain syndrome: Secondary | ICD-10-CM

## 2024-01-20 DIAGNOSIS — G8929 Other chronic pain: Secondary | ICD-10-CM

## 2024-01-20 NOTE — Telephone Encounter (Signed)
 Patient is going to need refills on his pain medication--wanted to know if he could get them filled on Thursday due to him going to [place] on Friday.  He said his pharmacy is [facility].

## 2024-01-21 MED ORDER — OXYCODONE HCL 10 MG PO TABS: 10.0000 mg | ORAL_TABLET | Freq: Four times a day (QID) | ORAL | 0 refills | Status: DC | PRN

## 2024-01-21 MED ORDER — MORPHINE SULFATE ER 30 MG PO TBCR: 30.0000 mg | EXTENDED_RELEASE_TABLET | Freq: Two times a day (BID) | ORAL | 0 refills | Status: DC

## 2024-01-21 NOTE — Telephone Encounter (Signed)
 PMP was Reviewed.  Call placed to Mr. Warzecha, no answer left message to return the call.  He reports he will be going out of town on Friday. According to PMP Oxycodone was filled on 12/29/2023, not sure if insurance will approve the early refill.  MS Contin filled on 12/25/2023.  Prescriptions sent to pharmacy.

## 2024-01-21 NOTE — Telephone Encounter (Signed)
 RETURNED EUNICE'S PHONE CALL.

## 2024-01-22 NOTE — Telephone Encounter (Signed)
 Patient called to let us know that Morphine needs to be prior auth'd by [employer].  Please call patient.

## 2024-01-23 ENCOUNTER — Telehealth: Payer: Self-pay | Admitting: Registered Nurse

## 2024-01-23 ENCOUNTER — Encounter: Payer: Self-pay | Admitting: *Deleted

## 2024-01-23 NOTE — Telephone Encounter (Signed)
 See another phone message regarding this.

## 2024-01-23 NOTE — Telephone Encounter (Signed)
 [facility] to state that patient's secondary insurance needs prior auth for morphine.

## 2024-01-23 NOTE — Telephone Encounter (Signed)
 Call made to [employer] to do override on MME due to over the amount allowed. Authorization initiated and will have 24 hour turn around time  Ref # [id number]

## 2024-01-26 NOTE — Telephone Encounter (Signed)
 Approved through 01/23/24-04/22/24. Mr Edick notfied by VM.

## 2024-02-02 ENCOUNTER — Other Ambulatory Visit: Payer: Self-pay | Admitting: Allergy and Immunology

## 2024-02-13 ENCOUNTER — Encounter: Admitting: Physical Medicine and Rehabilitation

## 2024-02-23 ENCOUNTER — Telehealth: Payer: Self-pay | Admitting: Physical Medicine and Rehabilitation

## 2024-02-23 DIAGNOSIS — G894 Chronic pain syndrome: Secondary | ICD-10-CM

## 2024-02-23 DIAGNOSIS — G8929 Other chronic pain: Secondary | ICD-10-CM

## 2024-02-23 NOTE — Telephone Encounter (Signed)
 Requesting refill for morphine .

## 2024-02-24 MED ORDER — OXYCODONE HCL 10 MG PO TABS: 10.0000 mg | ORAL_TABLET | Freq: Four times a day (QID) | ORAL | 0 refills | Status: DC | PRN

## 2024-02-24 MED ORDER — MORPHINE SULFATE ER 30 MG PO TBCR: 30.0000 mg | EXTENDED_RELEASE_TABLET | Freq: Two times a day (BID) | ORAL | 0 refills | Status: DC

## 2024-02-24 NOTE — Telephone Encounter (Signed)
Prescription e scribed 

## 2024-02-24 NOTE — Telephone Encounter (Signed)
 PMP was Reviewed.  MS Contin  and Oxycodone  e- scribed  to Pharmacy.  He has a scheduled appointment with Dr Raynaldo Call.

## 2024-03-02 ENCOUNTER — Telehealth: Payer: Self-pay

## 2024-03-02 NOTE — Telephone Encounter (Signed)
 Pt. Needed a PA for his prescriptions, he didn't pick them up last week. He only has 2 pills left. He want to be called [facility]. Are ready. [phone]

## 2024-03-02 NOTE — Telephone Encounter (Signed)
 Pt. [name] appointment set for tomorrow 5/14, and want to change to Thursday or Friday due to upset stomach.

## 2024-03-03 ENCOUNTER — Encounter: Admitting: Physical Medicine and Rehabilitation

## 2024-03-03 NOTE — Telephone Encounter (Signed)
 I am not sure what the problem is. I have faxed to his pharmacy the documentation from both insurance companies saying his medications are approved through August.

## 2024-03-08 ENCOUNTER — Encounter: Attending: Registered Nurse | Admitting: Physical Medicine and Rehabilitation

## 2024-03-08 ENCOUNTER — Encounter: Payer: Self-pay | Admitting: Physical Medicine and Rehabilitation

## 2024-03-08 VITALS — BP 128/86 | HR 104 | Ht 74.0 in | Wt 330.0 lb

## 2024-03-08 DIAGNOSIS — M62838 Other muscle spasm: Secondary | ICD-10-CM | POA: Insufficient documentation

## 2024-03-08 DIAGNOSIS — M792 Neuralgia and neuritis, unspecified: Secondary | ICD-10-CM | POA: Diagnosis not present

## 2024-03-08 DIAGNOSIS — G894 Chronic pain syndrome: Secondary | ICD-10-CM | POA: Insufficient documentation

## 2024-03-08 DIAGNOSIS — G8912 Acute post-thoracotomy pain: Secondary | ICD-10-CM | POA: Insufficient documentation

## 2024-03-08 MED ORDER — OXYCODONE HCL ER 20 MG PO T12A: 20.0000 mg | EXTENDED_RELEASE_TABLET | Freq: Two times a day (BID) | ORAL | 0 refills | Status: DC

## 2024-03-08 MED ORDER — METAXALONE 800 MG PO TABS: 800.0000 mg | ORAL_TABLET | Freq: Three times a day (TID) | ORAL | 1 refills | Status: DC | PRN

## 2024-03-08 MED ORDER — LIDOCAINE 5 % EX PTCH: 3.0000 | MEDICATED_PATCH | CUTANEOUS | 1 refills | Status: AC

## 2024-03-08 MED ORDER — GABAPENTIN 300 MG PO CAPS: ORAL_CAPSULE | ORAL | 5 refills | Status: DC

## 2024-03-08 MED ORDER — GABAPENTIN 300 MG PO CAPS: ORAL_CAPSULE | ORAL | 1 refills | Status: DC
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
## 2024-03-09 ENCOUNTER — Telehealth: Payer: Self-pay

## 2024-03-09 NOTE — Telephone Encounter (Signed)
 Error

## 2024-03-16 ENCOUNTER — Telehealth: Payer: Self-pay

## 2024-03-16 NOTE — Telephone Encounter (Signed)
 PA for Oxy ER approved 03/16/24-06/14/24 ref# [id number]

## 2024-03-24 ENCOUNTER — Telehealth: Payer: Self-pay

## 2024-03-24 NOTE — Telephone Encounter (Signed)
 One more pill of Morphine  left. Dr. Lovorn switch to Oxycodone  ER 20 mg is on back order. Can you address in Dr. Roxan Copes absence? [facility] [place]

## 2024-03-25 MED ORDER — FENTANYL 12 MCG/HR TD PT72: 1.0000 | MEDICATED_PATCH | TRANSDERMAL | 0 refills | Status: DC

## 2024-03-25 NOTE — Telephone Encounter (Signed)
 He stated that he has called around. Other patients have had the same issue not finding it.

## 2024-03-26 ENCOUNTER — Telehealth: Payer: Self-pay

## 2024-03-26 NOTE — Telephone Encounter (Signed)
 PA needed for Fentanyl  12 Mcg/Hr Patch. Patient picked up the Rx today. Per Pharmacy a PA will still be needed.

## 2024-03-29 NOTE — Telephone Encounter (Signed)
 Fentanyl  patches approved 03/26/24-09/25/24

## 2024-03-30 ENCOUNTER — Telehealth: Payer: Self-pay

## 2024-03-30 DIAGNOSIS — G894 Chronic pain syndrome: Secondary | ICD-10-CM

## 2024-03-30 DIAGNOSIS — G8929 Other chronic pain: Secondary | ICD-10-CM

## 2024-03-30 MED ORDER — OXYCODONE HCL 10 MG PO TABS: 10.0000 mg | ORAL_TABLET | Freq: Four times a day (QID) | ORAL | 0 refills | Status: DC | PRN

## 2024-03-30 NOTE — Telephone Encounter (Signed)
 Patient notified

## 2024-03-30 NOTE — Telephone Encounter (Signed)
 Patient states he needs a refill on Oxycodone  IR for breakthrough pain.

## 2024-04-11 ENCOUNTER — Other Ambulatory Visit: Payer: Self-pay | Admitting: Physical Medicine and Rehabilitation

## 2024-04-16 ENCOUNTER — Telehealth: Payer: Self-pay

## 2024-04-16 ENCOUNTER — Other Ambulatory Visit: Payer: Self-pay | Admitting: Physical Medicine and Rehabilitation

## 2024-04-16 DIAGNOSIS — G8929 Other chronic pain: Secondary | ICD-10-CM

## 2024-04-16 DIAGNOSIS — G894 Chronic pain syndrome: Secondary | ICD-10-CM

## 2024-04-16 MED ORDER — GABAPENTIN 300 MG PO CAPS: ORAL_CAPSULE | ORAL | 1 refills | Status: AC

## 2024-04-16 NOTE — Telephone Encounter (Signed)
 Pt requested a refill on his gabapentin . [facility] [address] [place] [address]. He states that he needs his medication urgently due to him being out and he works out of town. Pt sees Dr Lovorn. Please advise.

## 2024-04-16 NOTE — Telephone Encounter (Signed)
 Dr. Urbano authorized rx for Gabapentin .  Patient is currently working in [place]  and requesting a refill of Gabapentin .  [facility] [place] and patient made aware that due to state rules/laws with certain controlled substances there's a possibility that the medication cannot be filled however, the Provider did try to send it to the pharmacy in [place] .

## 2024-04-26 ENCOUNTER — Other Ambulatory Visit: Payer: Self-pay

## 2024-04-26 DIAGNOSIS — G894 Chronic pain syndrome: Secondary | ICD-10-CM

## 2024-04-26 DIAGNOSIS — G8929 Other chronic pain: Secondary | ICD-10-CM

## 2024-04-26 MED ORDER — OXYCODONE HCL 10 MG PO TABS: 10.0000 mg | ORAL_TABLET | Freq: Four times a day (QID) | ORAL | 0 refills | Status: DC | PRN

## 2024-04-26 MED ORDER — FENTANYL 12 MCG/HR TD PT72: 1.0000 | MEDICATED_PATCH | TRANSDERMAL | 0 refills | Status: DC

## 2024-04-26 MED ORDER — METAXALONE 800 MG PO TABS: 800.0000 mg | ORAL_TABLET | Freq: Three times a day (TID) | ORAL | 1 refills | Status: AC | PRN

## 2024-04-26 NOTE — Addendum Note (Signed)
 Addended by: Safiya Girdler on: 04/26/2024 04:27 PM   Modules accepted: Orders

## 2024-04-28 ENCOUNTER — Telehealth: Payer: Self-pay

## 2024-04-28 ENCOUNTER — Other Ambulatory Visit: Payer: Self-pay | Admitting: Physical Medicine and Rehabilitation

## 2024-04-28 MED ORDER — FENTANYL 12 MCG/HR TD PT72: 1.0000 | MEDICATED_PATCH | TRANSDERMAL | 0 refills | Status: DC

## 2024-04-28 NOTE — Telephone Encounter (Signed)
 [facility] requesting send a new rx of Fentanyl  with diagnosis code attached to it. I will put the fax in your folder.

## 2024-04-28 NOTE — Telephone Encounter (Signed)
 Dr. Lovorn is off this afternoon. Please review or advise.   Thom Ollinger is out of state. Dr. Cornelio sent his pain medication refills to the pharmacy but Fentanyl  is out of stock. Will [facility] [place]. They are holding the medication for him to be picked up today.   Please put diagnosis codes on the Rx G.89.21, M62.838, M79.2.  (Prior Rx has been called and cancelled).   Thank you

## 2024-05-03 ENCOUNTER — Telehealth: Payer: Self-pay

## 2024-05-03 NOTE — Telephone Encounter (Signed)
PA for Oxycodone submitted 

## 2024-05-07 ENCOUNTER — Telehealth: Payer: Self-pay

## 2024-05-07 ENCOUNTER — Encounter: Admitting: Registered Nurse

## 2024-05-07 NOTE — Telephone Encounter (Signed)
PA submitted for Morphine Sulfate ER °

## 2024-05-10 NOTE — Telephone Encounter (Signed)
 Approved 05/24/24-11/24/24 6 fills of 120

## 2024-05-14 ENCOUNTER — Telehealth: Payer: Self-pay | Admitting: Physical Medicine and Rehabilitation

## 2024-05-14 MED ORDER — NALOXONE HCL 4 MG/0.1ML NA LIQD: 1.0000 | Freq: Once | NASAL | 5 refills | Status: AC

## 2024-05-14 NOTE — Telephone Encounter (Signed)
 Got a denial for pt's MS Contin - which I changed to Oxycontin  then Duragesic  because the local and remote pharmacies have no ability to fill any of them and have no stock-   supposedly, the denial is due to pain meds amount above 50 MME- also wanted pt to have Narcan - which he's had an Rx for this in past- we also check PDMP and do regular Oral or urine drug screens on patient which have been appropriate.   Will resend an Rx for Narcan  nasal spray as well.   They said since they approved the Duragesic  and Oxycontin , cannot approve the MS Contin  at this time.  Will have staff inform patient.

## 2024-05-19 ENCOUNTER — Encounter (HOSPITAL_COMMUNITY): Payer: Self-pay

## 2024-05-19 ENCOUNTER — Other Ambulatory Visit: Payer: Self-pay

## 2024-05-19 ENCOUNTER — Emergency Department (HOSPITAL_COMMUNITY)

## 2024-05-19 ENCOUNTER — Inpatient Hospital Stay (HOSPITAL_COMMUNITY)
Admission: EM | Admit: 2024-05-19 | Discharge: 2024-05-21 | DRG: 918 | Disposition: A | Discharge status: Home / Self Care | Attending: Internal Medicine | Admitting: Internal Medicine

## 2024-05-19 DIAGNOSIS — M549 Dorsalgia, unspecified: Secondary | ICD-10-CM | POA: Diagnosis present

## 2024-05-19 DIAGNOSIS — Z7985 Long-term (current) use of injectable non-insulin antidiabetic drugs: Secondary | ICD-10-CM

## 2024-05-19 DIAGNOSIS — R42 Dizziness and giddiness: Secondary | ICD-10-CM | POA: Diagnosis present

## 2024-05-19 DIAGNOSIS — T63441A Toxic effect of venom of bees, accidental (unintentional), initial encounter: Secondary | ICD-10-CM | POA: Diagnosis present

## 2024-05-19 DIAGNOSIS — D72829 Elevated white blood cell count, unspecified: Secondary | ICD-10-CM | POA: Diagnosis present

## 2024-05-19 DIAGNOSIS — T380X5A Adverse effect of glucocorticoids and synthetic analogues, initial encounter: Secondary | ICD-10-CM | POA: Diagnosis present

## 2024-05-19 DIAGNOSIS — Z811 Family history of alcohol abuse and dependence: Secondary | ICD-10-CM

## 2024-05-19 DIAGNOSIS — T782XXA Anaphylactic shock, unspecified, initial encounter: Secondary | ICD-10-CM | POA: Diagnosis present

## 2024-05-19 DIAGNOSIS — E1165 Type 2 diabetes mellitus with hyperglycemia: Secondary | ICD-10-CM | POA: Diagnosis present

## 2024-05-19 DIAGNOSIS — I4510 Unspecified right bundle-branch block: Secondary | ICD-10-CM | POA: Diagnosis present

## 2024-05-19 DIAGNOSIS — G8922 Chronic post-thoracotomy pain: Secondary | ICD-10-CM | POA: Diagnosis present

## 2024-05-19 DIAGNOSIS — I10 Essential (primary) hypertension: Secondary | ICD-10-CM | POA: Diagnosis present

## 2024-05-19 DIAGNOSIS — F419 Anxiety disorder, unspecified: Secondary | ICD-10-CM | POA: Diagnosis present

## 2024-05-19 DIAGNOSIS — E876 Hypokalemia: Secondary | ICD-10-CM | POA: Diagnosis present

## 2024-05-19 DIAGNOSIS — R6889 Other general symptoms and signs: Secondary | ICD-10-CM | POA: Insufficient documentation

## 2024-05-19 DIAGNOSIS — J386 Stenosis of larynx: Secondary | ICD-10-CM | POA: Diagnosis present

## 2024-05-19 DIAGNOSIS — Z823 Family history of stroke: Secondary | ICD-10-CM

## 2024-05-19 DIAGNOSIS — J45909 Unspecified asthma, uncomplicated: Secondary | ICD-10-CM | POA: Diagnosis present

## 2024-05-19 DIAGNOSIS — F17290 Nicotine dependence, other tobacco product, uncomplicated: Secondary | ICD-10-CM | POA: Diagnosis present

## 2024-05-19 DIAGNOSIS — M792 Neuralgia and neuritis, unspecified: Secondary | ICD-10-CM | POA: Diagnosis present

## 2024-05-19 DIAGNOSIS — R1310 Dysphagia, unspecified: Secondary | ICD-10-CM | POA: Diagnosis present

## 2024-05-19 DIAGNOSIS — Z8042 Family history of malignant neoplasm of prostate: Secondary | ICD-10-CM

## 2024-05-19 DIAGNOSIS — Z91030 Bee allergy status: Secondary | ICD-10-CM | POA: Diagnosis present

## 2024-05-19 DIAGNOSIS — Z85528 Personal history of other malignant neoplasm of kidney: Secondary | ICD-10-CM

## 2024-05-19 DIAGNOSIS — X58XXXA Exposure to other specified factors, initial encounter: Secondary | ICD-10-CM | POA: Diagnosis present

## 2024-05-19 DIAGNOSIS — R490 Dysphonia: Secondary | ICD-10-CM | POA: Diagnosis present

## 2024-05-19 DIAGNOSIS — G4733 Obstructive sleep apnea (adult) (pediatric): Secondary | ICD-10-CM | POA: Diagnosis present

## 2024-05-19 DIAGNOSIS — Z905 Acquired absence of kidney: Secondary | ICD-10-CM | POA: Diagnosis not present

## 2024-05-19 DIAGNOSIS — F4310 Post-traumatic stress disorder, unspecified: Secondary | ICD-10-CM | POA: Diagnosis present

## 2024-05-19 DIAGNOSIS — K219 Gastro-esophageal reflux disease without esophagitis: Secondary | ICD-10-CM | POA: Diagnosis present

## 2024-05-19 DIAGNOSIS — Z8249 Family history of ischemic heart disease and other diseases of the circulatory system: Secondary | ICD-10-CM | POA: Diagnosis not present

## 2024-05-19 DIAGNOSIS — Z83719 Family history of colon polyps, unspecified: Secondary | ICD-10-CM

## 2024-05-19 DIAGNOSIS — E119 Type 2 diabetes mellitus without complications: Secondary | ICD-10-CM

## 2024-05-19 DIAGNOSIS — Z79899 Other long term (current) drug therapy: Secondary | ICD-10-CM

## 2024-05-19 DIAGNOSIS — R531 Weakness: Secondary | ICD-10-CM

## 2024-05-19 DIAGNOSIS — E8770 Fluid overload, unspecified: Secondary | ICD-10-CM | POA: Diagnosis present

## 2024-05-19 DIAGNOSIS — E669 Obesity, unspecified: Secondary | ICD-10-CM | POA: Diagnosis present

## 2024-05-19 DIAGNOSIS — Z720 Tobacco use: Secondary | ICD-10-CM | POA: Diagnosis present

## 2024-05-19 DIAGNOSIS — Z825 Family history of asthma and other chronic lower respiratory diseases: Secondary | ICD-10-CM

## 2024-05-19 DIAGNOSIS — F418 Other specified anxiety disorders: Secondary | ICD-10-CM | POA: Insufficient documentation

## 2024-05-19 DIAGNOSIS — Z6841 Body Mass Index (BMI) 40.0 and over, adult: Secondary | ICD-10-CM

## 2024-05-19 DIAGNOSIS — Z833 Family history of diabetes mellitus: Secondary | ICD-10-CM

## 2024-05-19 LAB — URINALYSIS, ROUTINE W REFLEX MICROSCOPIC
Bilirubin Urine: NEGATIVE
Glucose, UA: NEGATIVE mg/dL
Hgb urine dipstick: NEGATIVE
Ketones, ur: NEGATIVE mg/dL
Leukocytes,Ua: NEGATIVE
Nitrite: NEGATIVE
Protein, ur: NEGATIVE mg/dL
Specific Gravity, Urine: 1.003 — ABNORMAL LOW (ref 1.005–1.030)
pH: 7 (ref 5.0–8.0)

## 2024-05-19 LAB — RAPID URINE DRUG SCREEN, HOSP PERFORMED
Amphetamines: NOT DETECTED
Barbiturates: NOT DETECTED
Benzodiazepines: NOT DETECTED
Cocaine: NOT DETECTED
Opiates: NOT DETECTED
Tetrahydrocannabinol: NOT DETECTED

## 2024-05-19 LAB — COMPREHENSIVE METABOLIC PANEL WITH GFR
ALT: 26 U/L (ref 0–44)
AST: 25 U/L (ref 15–41)
Albumin: 3.9 g/dL (ref 3.5–5.0)
Alkaline Phosphatase: 66 U/L (ref 38–126)
Anion gap: 14 (ref 5–15)
BUN: 11 mg/dL (ref 6–20)
CO2: 21 mmol/L — ABNORMAL LOW (ref 22–32)
Calcium: 9.1 mg/dL (ref 8.9–10.3)
Chloride: 102 mmol/L (ref 98–111)
Creatinine, Ser: 0.99 mg/dL (ref 0.61–1.24)
GFR, Estimated: >60 mL/min (ref >60)
Glucose, Bld: 130 mg/dL — ABNORMAL HIGH (ref 70–99)
Potassium: 3.1 mmol/L — ABNORMAL LOW (ref 3.5–5.1)
Sodium: 137 mmol/L (ref 135–145)
Total Bilirubin: 0.6 mg/dL (ref 0.0–1.2)
Total Protein: 6.8 g/dL (ref 6.5–8.1)

## 2024-05-19 LAB — CBC WITH DIFFERENTIAL/PLATELET
Abs Immature Granulocytes: 0.11 K/uL — ABNORMAL HIGH (ref 0.00–0.07)
Basophils Absolute: 0.1 K/uL (ref 0.0–0.1)
Basophils Relative: 1 %
Eosinophils Absolute: 0.6 K/uL — ABNORMAL HIGH (ref 0.0–0.5)
Eosinophils Relative: 4 %
HCT: 41.1 % (ref 39.0–52.0)
Hemoglobin: 14.1 g/dL (ref 13.0–17.0)
Immature Granulocytes: 1 %
Lymphocytes Relative: 24 %
Lymphs Abs: 3.5 K/uL (ref 0.7–4.0)
MCH: 31.3 pg (ref 26.0–34.0)
MCHC: 34.3 g/dL (ref 30.0–36.0)
MCV: 91.3 fL (ref 80.0–100.0)
Monocytes Absolute: 1.3 K/uL — ABNORMAL HIGH (ref 0.1–1.0)
Monocytes Relative: 9 %
Neutro Abs: 9.1 K/uL — ABNORMAL HIGH (ref 1.7–7.7)
Neutrophils Relative %: 61 %
Platelets: 341 K/uL (ref 150–400)
RBC: 4.5 MIL/uL (ref 4.22–5.81)
RDW: 13.2 % (ref 11.5–15.5)
WBC: 14.6 K/uL — ABNORMAL HIGH (ref 4.0–10.5)
nRBC: 0 % (ref 0.0–0.2)

## 2024-05-19 LAB — HEMOGLOBIN A1C
Hgb A1c MFr Bld: 5.5 % (ref 4.8–5.6)
Mean Plasma Glucose: 111.15 mg/dL

## 2024-05-19 LAB — MAGNESIUM: Magnesium: 1.7 mg/dL (ref 1.7–2.4)

## 2024-05-19 LAB — HEPATITIS PANEL, ACUTE
HCV Ab: NONREACTIVE
Hep A IgM: NONREACTIVE
Hep B C IgM: NONREACTIVE
Hepatitis B Surface Ag: NONREACTIVE

## 2024-05-19 LAB — BRAIN NATRIURETIC PEPTIDE: B Natriuretic Peptide: 9.2 pg/mL (ref 0.0–100.0)

## 2024-05-19 LAB — GLUCOSE, CAPILLARY: Glucose-Capillary: 211 mg/dL — ABNORMAL HIGH (ref 70–99)

## 2024-05-19 LAB — MRSA NEXT GEN BY PCR, NASAL: MRSA by PCR Next Gen: NOT DETECTED

## 2024-05-19 LAB — TROPONIN I (HIGH SENSITIVITY)
Troponin I (High Sensitivity): 5 ng/L (ref <18)
Troponin I (High Sensitivity): 4 ng/L (ref <18)

## 2024-05-19 MED ORDER — METAXALONE 800 MG PO TABS
800.0000 mg | ORAL_TABLET | Freq: Three times a day (TID) | ORAL | Status: DC | PRN
  Administered 2024-05-20: 800 mg via ORAL
  Filled 2024-05-19 (×2): qty 1

## 2024-05-19 MED ORDER — IPRATROPIUM-ALBUTEROL 0.5-2.5 (3) MG/3ML IN SOLN
3.0000 mL | RESPIRATORY_TRACT | Status: DC | PRN
  Administered 2024-05-20: 3 mL via RESPIRATORY_TRACT
  Filled 2024-05-19 (×2): qty 3

## 2024-05-19 MED ORDER — POTASSIUM CHLORIDE 10 MEQ/100ML IV SOLN
10.0000 meq | INTRAVENOUS | Status: DC
  Administered 2024-05-19 – 2024-05-20 (×2): 10 meq via INTRAVENOUS
  Filled 2024-05-19 (×4): qty 100

## 2024-05-19 MED ORDER — PROCHLORPERAZINE EDISYLATE 10 MG/2ML IJ SOLN
10.0000 mg | Freq: Once | INTRAMUSCULAR | Status: AC
  Administered 2024-05-19: 10 mg via INTRAVENOUS
  Filled 2024-05-19: qty 2

## 2024-05-19 MED ORDER — ALPRAZOLAM 0.5 MG PO TABS
1.0000 mg | ORAL_TABLET | Freq: Three times a day (TID) | ORAL | Status: DC | PRN
  Administered 2024-05-20 – 2024-05-21 (×5): 1 mg via ORAL
  Filled 2024-05-19 (×5): qty 2

## 2024-05-19 MED ORDER — FAMOTIDINE 20 MG PO TABS
40.0000 mg | ORAL_TABLET | Freq: Two times a day (BID) | ORAL | Status: DC
  Administered 2024-05-19 – 2024-05-20 (×2): 40 mg via ORAL
  Filled 2024-05-19 (×2): qty 2

## 2024-05-19 MED ORDER — LIDOCAINE VISCOUS HCL 2 % MT SOLN
15.0000 mL | Freq: Once | OROMUCOSAL | Status: AC
  Administered 2024-05-19: 15 mL via OROMUCOSAL
  Filled 2024-05-19: qty 15

## 2024-05-19 MED ORDER — LACTATED RINGERS IV SOLN: INTRAVENOUS | Status: DC

## 2024-05-19 MED ORDER — KETOROLAC TROMETHAMINE 15 MG/ML IJ SOLN
15.0000 mg | Freq: Once | INTRAMUSCULAR | Status: AC
  Administered 2024-05-19: 15 mg via INTRAVENOUS
  Filled 2024-05-19: qty 1

## 2024-05-19 MED ORDER — CHLORHEXIDINE GLUCONATE CLOTH 2 % EX PADS
6.0000 | MEDICATED_PAD | Freq: Every day | CUTANEOUS | Status: DC
  Administered 2024-05-19 – 2024-05-20 (×2): 6 via TOPICAL

## 2024-05-19 MED ORDER — EPINEPHRINE HCL 5 MG/250ML IV SOLN IN NS
0.0000 ug/min | INTRAVENOUS | Status: DC
  Administered 2024-05-19: 1 ug/min via INTRAVENOUS

## 2024-05-19 MED ORDER — DIPHENHYDRAMINE HCL 50 MG/ML IJ SOLN
25.0000 mg | Freq: Once | INTRAMUSCULAR | Status: AC
  Administered 2024-05-19: 25 mg via INTRAVENOUS
  Filled 2024-05-19: qty 1

## 2024-05-19 MED ORDER — PREDNISONE 20 MG PO TABS: 40.0000 mg | ORAL_TABLET | Freq: Every day | ORAL | Status: DC

## 2024-05-19 MED ORDER — DOCUSATE SODIUM 100 MG PO CAPS: 100.0000 mg | ORAL_CAPSULE | Freq: Two times a day (BID) | ORAL | Status: DC | PRN

## 2024-05-19 MED ORDER — GABAPENTIN 300 MG PO CAPS
300.0000 mg | ORAL_CAPSULE | Freq: Three times a day (TID) | ORAL | Status: DC
  Administered 2024-05-19: 300 mg via ORAL
  Filled 2024-05-19: qty 1

## 2024-05-19 MED ORDER — DEXAMETHASONE SODIUM PHOSPHATE 10 MG/ML IJ SOLN
10.0000 mg | Freq: Once | INTRAMUSCULAR | Status: AC
  Administered 2024-05-19: 10 mg via INTRAVENOUS
  Filled 2024-05-19: qty 1

## 2024-05-19 MED ORDER — MAGNESIUM SULFATE 2 GM/50ML IV SOLN
2.0000 g | Freq: Once | INTRAVENOUS | Status: AC
  Administered 2024-05-19: 2 g via INTRAVENOUS
  Filled 2024-05-19: qty 50

## 2024-05-19 MED ORDER — INSULIN ASPART 100 UNIT/ML IJ SOLN
0.0000 [IU] | INTRAMUSCULAR | Status: DC
  Administered 2024-05-19: 3 [IU] via SUBCUTANEOUS
  Administered 2024-05-20 (×2): 2 [IU] via SUBCUTANEOUS
  Administered 2024-05-20: 3 [IU] via SUBCUTANEOUS

## 2024-05-19 MED ORDER — METOPROLOL SUCCINATE ER 50 MG PO TB24: 100.0000 mg | ORAL_TABLET | Freq: Every day | ORAL | Status: DC

## 2024-05-19 MED ORDER — MONTELUKAST SODIUM 10 MG PO TABS
10.0000 mg | ORAL_TABLET | Freq: Every day | ORAL | Status: DC
Start: 2024-05-20 — End: 2024-05-21
  Administered 2024-05-20 (×2): 10 mg via ORAL
  Filled 2024-05-19 (×2): qty 1

## 2024-05-19 MED ORDER — POLYETHYLENE GLYCOL 3350 17 G PO PACK: 17.0000 g | PACK | Freq: Every day | ORAL | Status: DC | PRN

## 2024-05-19 MED ORDER — ORAL CARE MOUTH RINSE: 15.0000 mL | OROMUCOSAL | Status: DC | PRN

## 2024-05-19 MED ORDER — LORATADINE 10 MG PO TABS
10.0000 mg | ORAL_TABLET | Freq: Every day | ORAL | Status: DC
  Administered 2024-05-20 – 2024-05-21 (×2): 10 mg via ORAL
  Filled 2024-05-19 (×2): qty 1

## 2024-05-19 MED ORDER — IRBESARTAN 75 MG PO TABS
37.5000 mg | ORAL_TABLET | Freq: Every day | ORAL | Status: DC
  Filled 2024-05-19: qty 0.5

## 2024-05-19 MED ORDER — LORAZEPAM 2 MG/ML IJ SOLN
0.5000 mg | Freq: Once | INTRAMUSCULAR | Status: AC
  Administered 2024-05-19: 0.5 mg via INTRAVENOUS
  Filled 2024-05-19: qty 1

## 2024-05-19 MED ORDER — FENTANYL CITRATE PF 50 MCG/ML IJ SOSY
50.0000 ug | PREFILLED_SYRINGE | Freq: Once | INTRAMUSCULAR | Status: AC
  Administered 2024-05-19: 50 ug via INTRAVENOUS
  Filled 2024-05-19: qty 1

## 2024-05-19 MED ORDER — METHYLPREDNISOLONE SODIUM SUCC 125 MG IJ SOLR
125.0000 mg | Freq: Once | INTRAMUSCULAR | Status: DC
  Filled 2024-05-19: qty 2

## 2024-05-19 MED ORDER — ENOXAPARIN SODIUM 40 MG/0.4ML IJ SOSY
40.0000 mg | PREFILLED_SYRINGE | INTRAMUSCULAR | Status: DC
  Administered 2024-05-19: 40 mg via SUBCUTANEOUS
  Filled 2024-05-19: qty 0.4

## 2024-05-19 NOTE — ED Triage Notes (Addendum)
 Pt was bib [facility] for anaphylaxis related to a bee sting. Sting happened on back of neck on arrival pt was SHOB grasping to breath. He took his epi 0.3 and 25mg  benadryl . Patient reported SHOB weakness, dizziness and nausea with cyanosis around the lips and tingling in the extremities EMS gave 2mg  of epi, 25 benadryl , 250 NS. En route pt became cyanotic. 0.5 epi last dose 7:15pm EMS also reported bilateral diminished breath sounds and wheezing BP 120 systolic HR 100 Cap 32  Spo2 96% RA

## 2024-05-19 NOTE — H&P (Signed)
 NAME:  [name], MRN:  [id number], DOB:  1976-08-22, LOS: 0 ADMISSION DATE:  05/19/2024, CONSULTATION DATE: 05/19/2024 REFERRING MD: Albertina Dixon, MD, CHIEF COMPLAINT: SOB 2 hrs before ED presentation    History of Present Illness:  A 48 yr old male patient who has PTSD, HTN, diet controlled DM-2, chronic pain syndrome, and well controlled asthma, who was stung by bees happened on back of neck. On arrival pt was had SOB, nausea, dizziness, tingling in the extremities, and weakness. He took his epi pen 0.3 mg and 25mg  benadryl  po. EMS gave 2mg  of epi, 25 benadryl , 250 cc NS 0.9%. En route pt became cyanotic, another 0.5 mg epi given. EMS also reported bilateral diminished breath sounds and wheezing. BP 120 systolic, HR 100, Cap 32, SpO2 96% RA. Placed on Epi drip @ 1 mcg/min and given Decadron  10 mg IV x1, Benadryl  25 mg IV x1, Fentanyl  50 mcg IV x1, Toradol  15 mg IV x1, Ativan  0.5 mg IV x1, and Compazine  10 mg IV x1. Vaping. No tobacco smoking, alcohol drinking, or illicit drug use.   Pertinent  Medical History  PTSD, HTN, diet controlled DM-2, chronic pain syndrome, well controlled asthma, morbid obesity   Significant Hospital Events: Including procedures, antibiotic start and stop dates in addition to other pertinent events   05/19/2024: ED eval and Rx as above  Interim History / Subjective:    Objective    Blood pressure 121/77, pulse (!) 104, temperature 97.6 F (36.4 C), temperature source Oral, resp. rate 16, height 6' 2 (1.88 m), weight (!) 141.5 kg, SpO2 98%.       No intake or output data in the 24 hours ending 05/19/24 2029 Filed Weights   05/19/24 1925  Weight: (!) 141.5 kg    Examination: General: alert, oriented x4, and comfortable. On RA. SpO2 99%  HENT: PERL, normal pharynx and oral mucosa. No LNE or thyromegaly. No JVD Lungs: symmetrical air entry bilaterally. No crackles or wheezing Cardiovascular: NL S1/S2. No m/g/r Abdomen: no distension or  tenderness Extremities: no edema. Symmetrical  Neuro: nonfocal    Resolved problem list   Assessment and Plan  Anaphylactic reaction for bees stings -Admit to ICU for closing monitoring -Wean Epi drip  -Steroids -H1B PRN -ISS for glycemic control -Duoneb PRN -I/S -NPO for now  PTSD -Resume home meds  HTN -Monitor BP -Resume home meds  Diet controlled DM-2 -ISS  Chronic pain syndrome -Resume home meds  Well controlled asthma -Duoneb PRN  Morbid obesity, ?OSA -Needs outpatient NPSG   Best Practice (right click and Reselect all SmartList Selections daily)   Diet/type: NPO w/ oral meds DVT prophylaxis LMWH Pressure ulcer(s): N/A GI prophylaxis: H2B Lines: N/A Foley:  N/A Code Status:  full code Last date of multidisciplinary goals of care discussion []   Labs   CBC: Recent Labs  Lab 05/19/24 1953  WBC 14.6*  NEUTROABS 9.1*  HGB 14.1  HCT 41.1  MCV 91.3  PLT 341    Basic Metabolic Panel: No results for input(s): NA, K, CL, CO2, GLUCOSE, BUN, CREATININE, CALCIUM, MG, PHOS in the last 168 hours. GFR: CrCl cannot be calculated (Patient's most recent lab result is older than the maximum 21 days allowed.). Recent Labs  Lab 05/19/24 1953  WBC 14.6*    Liver Function Tests: No results for input(s): AST, ALT, ALKPHOS, BILITOT, PROT, ALBUMIN in the last 168 hours. No results for input(s): LIPASE, AMYLASE in the last 168 hours. No results for input(s): AMMONIA in  the last 168 hours.  ABG    Component Value Date/Time   PHART 7.47 (H) 05/10/2023 1625   PCO2ART 41 05/10/2023 1625   PO2ART 75 (L) 05/10/2023 1625   HCO3 29.8 (H) 05/10/2023 1625   TCO2 26 06/02/2023 0037   ACIDBASEDEF 8.0 (H) 05/07/2023 1613   O2SAT 97.9 05/10/2023 1625     Coagulation Profile: No results for input(s): INR, PROTIME in the last 168 hours.  Cardiac Enzymes: No results for input(s): CKTOTAL, CKMB, CKMBINDEX,  TROPONINI in the last 168 hours.  HbA1C: Hgb A1c MFr Bld  Date/Time Value Ref Range Status  05/08/2023 07:01 AM 6.5 (H) 4.8 - 5.6 % Final    Comment:    (NOTE)         Prediabetes: 5.7 - 6.4         Diabetes: >6.4         Glycemic control for adults with diabetes: <7.0   03/07/2015 09:05 AM 5.4 4.6 - 6.5 % Final    Comment:    Glycemic Control Guidelines for People with Diabetes:Non Diabetic:  <6%Goal of Therapy: <7%Additional Action Suggested:  >8%     CBG: No results for input(s): GLUCAP in the last 168 hours.  Review of Systems:   Review of Systems  Constitutional:  Positive for malaise/fatigue. Negative for chills, diaphoresis, fever and weight loss.  HENT:  Negative for congestion, sinus pain and sore throat.   Eyes:  Negative for double vision.  Respiratory:  Positive for shortness of breath. Negative for cough, hemoptysis, sputum production, wheezing and stridor.   Cardiovascular:  Negative for chest pain, palpitations, orthopnea, claudication, leg swelling and PND.  Gastrointestinal:  Positive for nausea. Negative for abdominal pain, constipation, diarrhea, heartburn and vomiting.  Musculoskeletal:  Negative for back pain, joint pain, myalgias and neck pain.  Skin:  Negative for itching and rash.  Neurological:  Positive for dizziness and tingling. Negative for speech change, focal weakness and headaches.     Past Medical History:  He,  has a past medical history of Anxiety and depression (08/01/2013), Arthritis, Asthma, Cellulitis, Chicken pox, Chronic pain, Diarrhea (08/01/2013), History of kidney cancer, vasectomy, Hypertension, and Renal cell carcinoma (HCC) (06/01/2013).   Surgical History:   Past Surgical History:  Procedure Laterality Date   ABLATION     Lumbar   COLONOSCOPY WITH PROPOFOL  N/A 08/19/2013   Procedure: COLONOSCOPY WITH PROPOFOL ;  Surgeon: Toribio SHAUNNA Cedar, MD;  Location: [facility];  Service: Endoscopy;  Laterality: N/A;    ESOPHAGOGASTRODUODENOSCOPY (EGD) WITH PROPOFOL  N/A 08/19/2013   Procedure: ESOPHAGOGASTRODUODENOSCOPY (EGD) WITH PROPOFOL ;  Surgeon: Toribio SHAUNNA Cedar, MD;  Location: [facility];  Service: Endoscopy;  Laterality: N/A;   INTUBATION-ENDOTRACHEAL WITH TRACHEOSTOMY STANDBY N/A 05/07/2023   Procedure: INTUBATION-ENDOTRACHEAL WITH TRACHEOSTOMY STANDBY;  Surgeon: Paola Dreama SAILOR, MD;  Location: [facility] OR;  Service: General;  Laterality: N/A;   IR RADIOLOGIST EVAL & MGMT  05/20/2019   KNEE ARTHROSCOPY Left 07/13/2020   Procedure: left knee arthroscopy, debridement, cyst decompression;  Surgeon: Addie Cordella Hamilton, MD;  Location: [facility];  Service: Orthopedics;  Laterality: Left;   ORIF MANDIBULAR FRACTURE N/A 02/16/2016   Procedure: OPEN REDUCTION INTERNAL FIXATION (ORIF) MANDIBULAR FRACTURE;  Surgeon: Norleen Notice, MD;  Location: [facility] OR;  Service: ENT;  Laterality: N/A;   RIB PLATING Left 02/20/2016   Procedure: LEFT RIB PLATING;  Surgeon: Maude Fleeta Ochoa, MD;  Location: [facility] OR;  Service: Thoracic;  Laterality: Left;   ROBOTIC ASSITED PARTIAL NEPHRECTOMY Left 06/17/2013  Procedure: ROBOTIC ASSITED PARTIAL NEPHRECTOMY;  Surgeon: Noretta Ferrara, MD;  Location: [facility];  Service: Urology;  Laterality: Left;   TRACHEOSTOMY TUBE PLACEMENT N/A 02/16/2016   Procedure: TRACHEOSTOMY;  Surgeon: Norleen Notice, MD;  Location: [facility] OR;  Service: ENT;  Laterality: N/A;   VASECTOMY  10/21/2010   WISDOM TOOTH EXTRACTION  middle school   WRIST SURGERY Left middle school   arteries and nerves tangled up     Social History:   reports that he has quit smoking. His smoking use included cigarettes. He has a 24 pack-year smoking history. He uses smokeless tobacco. He reports that he does not currently use alcohol. He reports that he does not use drugs.   Family History:  His family history includes Alcohol abuse in his mother; Asthma in his father and son; Cirrhosis in his father and mother; Colitis in his father;  Colon polyps in his sister; Diabetes in his maternal grandfather; Heart attack in an other family member; Heart disease in his father; Other in his brother and father; Prostate cancer in his paternal grandfather; Stroke in an other family member.   Allergies Allergies  Allergen Reactions   Bee Venom Shortness Of Breath, Swelling and Anaphylaxis    Arm swells   Hornet Venom Anaphylaxis, Shortness Of Breath and Swelling    Arm swells     Home Medications  Prior to Admission medications   Medication Sig Start Date End Date Taking? Authorizing Provider  albuterol  (VENTOLIN  HFA) 108 (90 Base) MCG/ACT inhaler Inhale 1-2 puffs into the lungs every 6 (six) hours as needed for wheezing or shortness of breath. 04/10/23   Kozlow, Camellia PARAS, MD  ALPRAZolam  (XANAX ) 1 MG tablet Take 1 tablet (1 mg total) by mouth 3 (three) times daily as needed for up to 30 doses for anxiety. 06/03/23   Claudene Toribio BROCKS, MD  AMBULATORY NON FORMULARY MEDICATION Apply 1 Application topically 2 (two) times daily. Medication Name: Nitroglycerin 0.125% gel. Apply pea size amount to the rectum two times a day as needed for 6 weeks 06/13/23   Stacia Glendia BRAVO, MD  cetirizine  (ZYRTEC ) 10 MG tablet 2 tablets 2 times per day Patient taking differently: Take 20 mg by mouth 2 (two) times daily. 04/10/23   Kozlow, Camellia PARAS, MD  diazepam  (VALIUM ) 5 MG tablet Take by mouth. 02/23/24   [provider]  EPINEPHrine  0.3 mg/0.3 mL IJ SOAJ injection Inject 0.3 mg into the muscle as needed for anaphylaxis. As needed for life-threatening allergic reactions 05/11/23   Rai, Ripudeep K, MD  famotidine  (PEPCID ) 40 MG tablet TAKE 1 TABLET BY MOUTH TWICE A DAY 09/01/23   Kozlow, Camellia PARAS, MD  fentaNYL  (DURAGESIC ) 12 MCG/HR Place 1 patch onto the skin every 3 (three) days. For long acting pain relief- to take the place of MS Contin /Oxycontin - since they are both not available- 04/28/24   Emeline Joesph BROCKS, DO  fexofenadine  (ALLEGRA ) 180 MG tablet Take 180 mg  by mouth daily. 03/03/24   [provider]  fluticasone  (FLONASE ) 50 MCG/ACT nasal spray Place 1 spray into both nostrils at bedtime. 05/23/20   [provider]  gabapentin  (NEURONTIN ) 300 MG capsule TAKE 3 CAPSULES IN THE MORNING, 3 CAPSULES AT LUNCH, AND 3 CAPSULES ARE BEDTIME 04/16/24   Urbano Albright, MD  hydrOXYzine  (ATARAX ) 10 MG tablet Take 10 mg by mouth 2 (two) times daily. 09/16/23   [provider]  lidocaine  (LIDODERM ) 5 % Place 3 patches onto the skin daily. Remove & Discard  patch within 12 hours or as directed by MD 03/08/24   Lovorn, Megan, MD  meclizine (ANTIVERT) 25 MG tablet Take 25 mg by mouth daily as needed for dizziness or nausea. 04/25/23   [provider]  metaxalone  (SKELAXIN ) 800 MG tablet Take 1 tablet (800 mg total) by mouth 3 (three) times daily as needed for muscle spasms. 04/26/24   Lovorn, Megan, MD  methylPREDNISolone  (MEDROL  DOSEPAK) 4 MG TBPK tablet Take by mouth. 02/23/24   [provider]  metoprolol  succinate (TOPROL -XL) 100 MG 24 hr tablet Take 100 mg by mouth daily. 03/07/21   [provider]  montelukast  (SINGULAIR ) 10 MG tablet Take 1 tablet (10 mg total) by mouth at bedtime. 05/11/23   Rai, Ripudeep K, MD  Naproxen  DR 500 MG TBEC TAKE 1 TABLET BY MOUTH TWICE A DAY 04/12/24   Lovorn, Megan, MD  olmesartan (BENICAR) 40 MG tablet Take 40 mg by mouth daily. 03/02/24   [provider]  Oxycodone  HCl 10 MG TABS Take 1 tablet (10 mg total) by mouth 4 (four) times daily as needed. 04/26/24   Lovorn, Megan, MD  OZEMPIC, 0.25 OR 0.5 MG/DOSE, 2 MG/3ML SOPN 0.25 MILLIGRAM WEEKLY 10/14/23   [provider]  phentermine  (ADIPEX-P ) 37.5 MG tablet Take 18.75 mg by mouth every morning. 08/19/22   [provider]  promethazine  (PHENERGAN ) 25 MG tablet Take 25 mg by mouth every 6 (six) hours as needed for nausea or vomiting.    [provider]  QUEtiapine  (SEROQUEL  XR) 50 MG TB24 24 hr tablet Take 50  mg by mouth. 04/20/20   [provider]  sertraline  (ZOLOFT ) 100 MG tablet Take 100 mg by mouth daily. 06/25/22   [provider]  sildenafil  (REVATIO ) 20 MG tablet Take 20 mg by mouth daily as needed (for ED). 05/23/23   [provider]  SUMAtriptan  (IMITREX ) 25 MG tablet Take 25 mg by mouth daily as needed for migraine or headache. 04/28/23   [provider]  tadalafil  (CIALIS ) 5 MG tablet Take 5 mg by mouth every morning. 02/04/24   [provider]  Testosterone  1.62 % GEL Apply 40.5 mg topically daily. 06/10/23   [provider]  triamcinolone  cream (KENALOG) 0.1 % Apply 1 Application topically 2 (two) times daily as needed (for rash). 04/04/23   [provider]  WEGOVY 0.25 MG/0.5ML SOAJ SMARTSIG:0.25 Milligram(s) SUB-Q Once a Week 02/05/24   [provider]     Critical care time: 55 min    Mancel Ply, MD, [facility]  [facility] [phone] or if no answer before 7:00PM call [phone] For any issues after 7:00PM please call eLink [phone]

## 2024-05-19 NOTE — Progress Notes (Signed)
 eLink Physician-Brief Progress Note Patient Name: [name] DOB: [birth year] MRN: [id number]   Date of Service  05/19/2024  HPI/Events of Note  48 yr old male patient who has PTSD, HTN, diet controlled DM-2, chronic pain syndrome, and well controlled asthma, who was stung by bees happened on back of neck and developed anaphylaxis  Vital signs are within normal limits.  Saturating 94% on room air.  Results show hypokalemia and leukocytosis.  Radiograph with mild fluid overload.  EKG with right bundle branch block.  BMI of 40 with weight 141 kg.  eICU Interventions  Epinephrine  infusion, antihistamines, steroids  Continue crystalloid infusion with limited duration  Sliding scale insulin   DVT prophylaxis with enoxaparin  GI prophylaxis with anticipated famotidine    0036 - Resume chronic oxycodone  and lidocaine  patches for 9/10 chronic pain in back and LEs  0440 -staff notes that pain extending from the upper back to the back of the neck.  Primary concern is with his having increasing difficulty managing his secretions and having significant throat discomfort.  No dysphonia, stridor, or hypoxemia.  For now, still speaking in sentences.  Escalated epinephrine  initially to 2 mcg/min then to 5 mcg.  Switch back to IV steroids.  Maintain scheduled IV Benadryl .  Famotidine  already in place  0516 -continues to feel more throat discomfort and tightness despite Benadryl , escalation of epi.  Vital signs are stable for the time being and not dysphonic, but worrisome overall.  Will request ground team evaluation.  Intervention Category Evaluation Type: New Patient Evaluation  [name] 05/19/2024, 9:13 PM
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
## 2024-05-20 ENCOUNTER — Inpatient Hospital Stay (HOSPITAL_COMMUNITY)

## 2024-05-20 DIAGNOSIS — K219 Gastro-esophageal reflux disease without esophagitis: Secondary | ICD-10-CM | POA: Diagnosis present

## 2024-05-20 DIAGNOSIS — T782XXA Anaphylactic shock, unspecified, initial encounter: Secondary | ICD-10-CM | POA: Diagnosis not present

## 2024-05-20 DIAGNOSIS — R6889 Other general symptoms and signs: Secondary | ICD-10-CM | POA: Insufficient documentation

## 2024-05-20 DIAGNOSIS — T63441A Toxic effect of venom of bees, accidental (unintentional), initial encounter: Secondary | ICD-10-CM | POA: Diagnosis not present

## 2024-05-20 DIAGNOSIS — F418 Other specified anxiety disorders: Secondary | ICD-10-CM | POA: Insufficient documentation

## 2024-05-20 LAB — BASIC METABOLIC PANEL WITH GFR
Anion gap: 11 (ref 5–15)
BUN: 11 mg/dL (ref 6–20)
CO2: 21 mmol/L — ABNORMAL LOW (ref 22–32)
Calcium: 9.2 mg/dL (ref 8.9–10.3)
Chloride: 103 mmol/L (ref 98–111)
Creatinine, Ser: 1.02 mg/dL (ref 0.61–1.24)
GFR, Estimated: >60 mL/min (ref >60)
Glucose, Bld: 149 mg/dL — ABNORMAL HIGH (ref 70–99)
Potassium: 4.4 mmol/L (ref 3.5–5.1)
Sodium: 135 mmol/L (ref 135–145)

## 2024-05-20 LAB — MAGNESIUM: Magnesium: 1.9 mg/dL (ref 1.7–2.4)

## 2024-05-20 LAB — GLUCOSE, CAPILLARY
Glucose-Capillary: 130 mg/dL — ABNORMAL HIGH (ref 70–99)
Glucose-Capillary: 151 mg/dL — ABNORMAL HIGH (ref 70–99)
Glucose-Capillary: 163 mg/dL — ABNORMAL HIGH (ref 70–99)
Glucose-Capillary: 204 mg/dL — ABNORMAL HIGH (ref 70–99)
Glucose-Capillary: 217 mg/dL — ABNORMAL HIGH (ref 70–99)
Glucose-Capillary: 237 mg/dL — ABNORMAL HIGH (ref 70–99)

## 2024-05-20 LAB — TROPONIN I (HIGH SENSITIVITY)
Troponin I (High Sensitivity): 3 ng/L (ref <18)
Troponin I (High Sensitivity): 5 ng/L (ref <18)

## 2024-05-20 MED ORDER — DEXAMETHASONE SODIUM PHOSPHATE 10 MG/ML IJ SOLN
10.0000 mg | Freq: Four times a day (QID) | INTRAMUSCULAR | Status: DC
  Administered 2024-05-20: 10 mg via INTRAVENOUS
  Filled 2024-05-20 (×2): qty 1

## 2024-05-20 MED ORDER — PANTOPRAZOLE SODIUM 40 MG PO TBEC: 40.0000 mg | DELAYED_RELEASE_TABLET | Freq: Every day | ORAL | 2 refills | Status: DC

## 2024-05-20 MED ORDER — TRAMADOL HCL 50 MG PO TABS
50.0000 mg | ORAL_TABLET | Freq: Four times a day (QID) | ORAL | Status: DC | PRN
  Administered 2024-05-20 – 2024-05-21 (×2): 50 mg via ORAL
  Filled 2024-05-20 (×2): qty 1

## 2024-05-20 MED ORDER — FAMOTIDINE 40 MG PO TABS: 40.0000 mg | ORAL_TABLET | Freq: Two times a day (BID) | ORAL | 0 refills | Status: AC

## 2024-05-20 MED ORDER — SERTRALINE HCL 100 MG PO TABS
100.0000 mg | ORAL_TABLET | Freq: Every day | ORAL | Status: DC
  Administered 2024-05-20: 100 mg via ORAL
  Filled 2024-05-20: qty 1

## 2024-05-20 MED ORDER — INSULIN ASPART 100 UNIT/ML IJ SOLN
0.0000 [IU] | Freq: Every day | INTRAMUSCULAR | Status: DC
  Administered 2024-05-20: 2 [IU] via SUBCUTANEOUS

## 2024-05-20 MED ORDER — SERTRALINE HCL 50 MG PO TABS
150.0000 mg | ORAL_TABLET | Freq: Every day | ORAL | Status: DC
  Administered 2024-05-21: 150 mg via ORAL
  Filled 2024-05-20: qty 1

## 2024-05-20 MED ORDER — OXYCODONE HCL 5 MG PO TABS
10.0000 mg | ORAL_TABLET | Freq: Once | ORAL | Status: AC
  Administered 2024-05-20: 10 mg via ORAL
  Filled 2024-05-20: qty 2

## 2024-05-20 MED ORDER — DEXAMETHASONE SODIUM PHOSPHATE 10 MG/ML IJ SOLN
10.0000 mg | Freq: Two times a day (BID) | INTRAMUSCULAR | Status: DC
  Filled 2024-05-20: qty 1

## 2024-05-20 MED ORDER — QUETIAPINE FUMARATE ER 50 MG PO TB24: 50.0000 mg | ORAL_TABLET | Freq: Every day | ORAL | Status: DC

## 2024-05-20 MED ORDER — DIPHENHYDRAMINE HCL 25 MG PO CAPS
25.0000 mg | ORAL_CAPSULE | Freq: Four times a day (QID) | ORAL | Status: DC
  Administered 2024-05-20 – 2024-05-21 (×4): 25 mg via ORAL
  Filled 2024-05-20 (×4): qty 1

## 2024-05-20 MED ORDER — HYDROMORPHONE HCL 1 MG/ML IJ SOLN
1.0000 mg | Freq: Once | INTRAMUSCULAR | Status: AC
  Administered 2024-05-20: 1 mg via INTRAVENOUS

## 2024-05-20 MED ORDER — LIDOCAINE 5 % EX PTCH
3.0000 | MEDICATED_PATCH | CUTANEOUS | Status: DC
  Administered 2024-05-20 – 2024-05-21 (×2): 3 via TRANSDERMAL
  Filled 2024-05-20 (×2): qty 3

## 2024-05-20 MED ORDER — LOPERAMIDE HCL 2 MG PO CAPS: 2.0000 mg | ORAL_CAPSULE | ORAL | Status: DC | PRN

## 2024-05-20 MED ORDER — INSULIN ASPART 100 UNIT/ML IJ SOLN
0.0000 [IU] | Freq: Three times a day (TID) | INTRAMUSCULAR | Status: DC
  Administered 2024-05-20: 3 [IU] via SUBCUTANEOUS
  Administered 2024-05-20 – 2024-05-21 (×2): 7 [IU] via SUBCUTANEOUS
  Administered 2024-05-21: 4 [IU] via SUBCUTANEOUS

## 2024-05-20 MED ORDER — POTASSIUM CHLORIDE CRYS ER 20 MEQ PO TBCR
40.0000 meq | EXTENDED_RELEASE_TABLET | Freq: Once | ORAL | Status: AC
  Administered 2024-05-20: 40 meq via ORAL
  Filled 2024-05-20: qty 2

## 2024-05-20 MED ORDER — OXYCODONE HCL 5 MG PO TABS
10.0000 mg | ORAL_TABLET | Freq: Four times a day (QID) | ORAL | Status: DC | PRN
  Administered 2024-05-20 (×2): 10 mg via ORAL
  Filled 2024-05-20 (×2): qty 2

## 2024-05-20 MED ORDER — MAGNESIUM SULFATE 2 GM/50ML IV SOLN
2.0000 g | Freq: Once | INTRAVENOUS | Status: AC
  Administered 2024-05-20: 2 g via INTRAVENOUS
  Filled 2024-05-20: qty 50

## 2024-05-20 MED ORDER — DEXMEDETOMIDINE HCL IN NACL 400 MCG/100ML IV SOLN: 0.0000 ug/kg/h | INTRAVENOUS | Status: DC

## 2024-05-20 MED ORDER — OXYCODONE-ACETAMINOPHEN 5-325 MG PO TABS: 1.0000 | ORAL_TABLET | Freq: Four times a day (QID) | ORAL | Status: DC | PRN

## 2024-05-20 MED ORDER — LOPERAMIDE HCL 2 MG PO CAPS
4.0000 mg | ORAL_CAPSULE | Freq: Once | ORAL | Status: AC
  Administered 2024-05-20: 4 mg via ORAL
  Filled 2024-05-20: qty 2

## 2024-05-20 MED ORDER — PANTOPRAZOLE SODIUM 40 MG IV SOLR
40.0000 mg | Freq: Two times a day (BID) | INTRAVENOUS | Status: AC
  Administered 2024-05-20 (×2): 40 mg via INTRAVENOUS
  Filled 2024-05-20 (×2): qty 10

## 2024-05-20 MED ORDER — NICOTINE 14 MG/24HR TD PT24
14.0000 mg | MEDICATED_PATCH | Freq: Every day | TRANSDERMAL | Status: DC
  Administered 2024-05-20 – 2024-05-21 (×2): 14 mg via TRANSDERMAL
  Filled 2024-05-20 (×2): qty 1

## 2024-05-20 MED ORDER — EPINEPHRINE 0.3 MG/0.3ML IJ SOAJ
0.3000 mg | INTRAMUSCULAR | 1 refills | Status: AC | PRN
Start: 2024-05-20 — End: ?

## 2024-05-20 MED ORDER — DIPHENHYDRAMINE HCL 50 MG/ML IJ SOLN
25.0000 mg | Freq: Four times a day (QID) | INTRAMUSCULAR | Status: DC
  Administered 2024-05-20 (×2): 25 mg via INTRAVENOUS
  Filled 2024-05-20 (×2): qty 1

## 2024-05-20 MED ORDER — FENTANYL 12 MCG/HR TD PT72
1.0000 | MEDICATED_PATCH | TRANSDERMAL | Status: DC
  Administered 2024-05-20: 1 via TRANSDERMAL
  Filled 2024-05-20: qty 1

## 2024-05-20 MED ORDER — PANTOPRAZOLE SODIUM 40 MG PO TBEC
40.0000 mg | DELAYED_RELEASE_TABLET | Freq: Every day | ORAL | Status: DC
  Administered 2024-05-21: 40 mg via ORAL
  Filled 2024-05-20: qty 1

## 2024-05-20 MED ORDER — METHYLPREDNISOLONE SODIUM SUCC 125 MG IJ SOLR
80.0000 mg | Freq: Once | INTRAMUSCULAR | Status: AC
  Administered 2024-05-20: 80 mg via INTRAVENOUS
  Filled 2024-05-20: qty 2

## 2024-05-20 MED ORDER — FAMOTIDINE 20 MG PO TABS
40.0000 mg | ORAL_TABLET | Freq: Two times a day (BID) | ORAL | Status: DC
  Administered 2024-05-20 – 2024-05-21 (×2): 40 mg via ORAL
  Filled 2024-05-20 (×2): qty 2

## 2024-05-20 MED ORDER — GABAPENTIN 300 MG PO CAPS
900.0000 mg | ORAL_CAPSULE | Freq: Three times a day (TID) | ORAL | Status: DC
  Administered 2024-05-20 – 2024-05-21 (×4): 900 mg via ORAL
  Filled 2024-05-20 (×4): qty 3

## 2024-05-20 MED ORDER — FLUTICASONE PROPIONATE 50 MCG/ACT NA SUSP
1.0000 | Freq: Every day | NASAL | Status: DC
  Administered 2024-05-21: 1 via NASAL
  Filled 2024-05-20 (×2): qty 16

## 2024-05-20 MED ORDER — METHYLPREDNISOLONE SODIUM SUCC 40 MG IJ SOLR: 40.0000 mg | Freq: Two times a day (BID) | INTRAMUSCULAR | Status: DC

## 2024-05-20 MED ORDER — OXYCODONE HCL 5 MG PO TABS
20.0000 mg | ORAL_TABLET | Freq: Two times a day (BID) | ORAL | Status: DC
  Administered 2024-05-20 – 2024-05-21 (×2): 20 mg via ORAL
  Filled 2024-05-20 (×2): qty 4

## 2024-05-20 MED ORDER — FLUTICASONE PROPIONATE 50 MCG/ACT NA SUSP: 1.0000 | Freq: Every day | NASAL | 2 refills | Status: AC

## 2024-05-20 MED ORDER — RACEPINEPHRINE HCL 2.25 % IN NEBU
0.2500 mL | INHALATION_SOLUTION | Freq: Once | RESPIRATORY_TRACT | Status: AC
  Administered 2024-05-20: 0.25 mL via RESPIRATORY_TRACT
  Filled 2024-05-20: qty 0.5

## 2024-05-20 MED ORDER — ENOXAPARIN SODIUM 60 MG/0.6ML IJ SOSY
60.0000 mg | PREFILLED_SYRINGE | INTRAMUSCULAR | Status: DC
  Administered 2024-05-20: 60 mg via SUBCUTANEOUS
  Filled 2024-05-20 (×2): qty 0.6

## 2024-05-20 MED ORDER — FEXOFENADINE HCL 180 MG PO TABS: 180.0000 mg | ORAL_TABLET | Freq: Every day | ORAL | 2 refills | Status: DC

## 2024-05-20 MED ORDER — HYDROMORPHONE HCL 1 MG/ML IJ SOLN
INTRAMUSCULAR | Status: AC
  Filled 2024-05-20: qty 1

## 2024-05-20 MED ORDER — PREDNISONE 20 MG PO TABS: 50.0000 mg | ORAL_TABLET | Freq: Every day | ORAL | Status: DC

## 2024-05-20 MED ORDER — PREDNISONE 50 MG PO TABS
50.0000 mg | ORAL_TABLET | Freq: Every day | ORAL | Status: DC
  Administered 2024-05-21: 50 mg via ORAL
  Filled 2024-05-20: qty 1

## 2024-05-20 NOTE — Discharge Instructions (Signed)
 The pepcid  you will take for 2 more days as part of completion of treatment for anaphylaxis  Starting you on protonix  -think some of your sore throat and hoarse voice is complicated by undertreated reflux  -if you continue to have vocal hoarseness would ask your PCP to consider ENT evaluation  Specifically because of prior intubation and trach you are at risk for subglottic stenosis as well as possibly vocal cord injury (would have been from oral endotracheal tube prior to trach)  Ask your PCP about allergist referral  -specifically about concern for biphasic anaphylaxis     Psychiatry and Counseling [facility] [address] [place], [place] [phone]  Psychiatrists [facility]   [facility] [address]    [address] [place], [place] [address]    [place], [place] [address] [phone]      [phone]  Dr. Elna Lo     [facility] [address]    [address] [place] [address]    [place] [address] [phone]      [phone]  Ima Anon, MD     [facility] [address]    [address] [place] [address]    [place] [address] [phone]       [phone]  [facility]rap[facility]    [facility] [address]   [address] [place], [place] [phone]      [phone]  [facility]  [facility] [address]     [address] [place] [address]    [place], [place] [phone]      [phone]   [facility]   [facility] [address]    [address] [place], [place] [address]    [place], [place] [address] [phone]      [phone]  [facility]   [facility] [address]    [address] [place], [place] [address]    [place], [place] [address] [phone]      [phone]

## 2024-05-20 NOTE — Plan of Care (Signed)
  Problem: Coping: Goal: Ability to adjust to condition or change in health will improve Outcome: Progressing   Problem: Metabolic: Goal: Ability to maintain appropriate glucose levels will improve Outcome: Progressing   Problem: Education: Goal: Knowledge of General Education information will improve Description: Including pain rating scale, medication(s)/side effects and non-pharmacologic comfort measures Outcome: Progressing   Problem: Clinical Measurements: Goal: Cardiovascular complication will be avoided Outcome: Progressing   Problem: Clinical Measurements: Goal: Respiratory complications will improve Outcome: Not Progressing

## 2024-05-20 NOTE — Discharge Summary (Signed)
 Physician Discharge Summary         Patient ID: [name] MRN: [id number] DOB/AGE: 49-Sep-1977 49 y.o.  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 On arrival pt was had SOB, nausea, dizziness, tingling in the extremities, and weakness. He took his epi pen 0.3 mg and 25mg  benadryl  po. EMS gave 2mg  of epi, 25 benadryl , 250 cc NS 0.9%. En route pt became cyanotic, another 0.5 mg epi given. EMS also reported bilateral diminished breath sounds and wheezing. BP 120 systolic, HR 100, Cap 32, SpO2 96% RA. Placed on Epi drip @ 1 mcg/min and given Decadron  10 mg IV x1, Benadryl  25 mg IV x1, Fentanyl  50 mcg IV x1, Toradol  15 mg IV x1, Ativan  0.5 mg IV x1, and Compazine  10 mg IV x1. Vaping. No tobacco smoking, alcohol drinking, or illicit drug use.   Admitted to ICU. Initially felt better, then once in ICU about 5:30 in am  reported recurrent onset of throat irritation, feeling like he could not breath, also reported head ache with multiple other acute on chronic areas of pain including back and neck. His epinephrine  was continued as were steroids and concern for possible biphasic anaphylactic  reaction.   On am rounds was hemodynamically stable. Remained on epinephrine  gtt. Continued to have sore throat, but on exam he had no swelling, no stridor, was swallowing with out difficulty and thre was good visualization of uvula. His epinephrine  was weaned to off by 10am. He remained hemodynamically stable, was ambulatory in room. Requiring no oxygen, tolerating diet. Still complained of sore throat and generalized discomfort most of which seemed consistent with his chronic pain and perhaps exacerbated by missing doses of his typical regimen when acutely ill. His throat was again evaluated and remained unchanged from early exam. Felt vocal hoarseness and sore throat were likely more reflux related than anaphylaxis. He has history of reflux and has not been taking therapy for it although was placed on H2B on admit.   He was deemed ready for transfer out if the ICU, we decided to keep him in the hospital overnight for on-going evaluation given concern earlier for biphasic anaphylactic response.   On 8/1 he was seen by physical therapy and discharged home.     Discharge Plan by Active Problems     Bee sting allergy: Presented with anaphylaxis and concern for biphasic response. Anaphylaxis resolved.  Completed steroids Plan Home with on-going H1 and H2B. With pepcid  to stop after 2 days Will place refill for his epipen  Needs to stay away from bees Would consider allergist referral from his PCP re: possibility of biphasic response   Chronic post-thoracotomy pain, nerve pain and  PTSD (post-traumatic  stress disorder) and situational anxiety  Certainly complicated treatment and difficult to tell how much of his symptoms were truly from his anaphylaxis vs situational anxiety exacerbating his baseline PTSD and pain  -completed med reconciliation  -home meds restarted. Did add PRN tramadol  -frankly discussed that pain management at discharge would continue to be the sole responsibility of current  pain provider and that we would not be further addressing his analgesic regimen  Plan Home on his current pain and anxiety regimen F/u w/ his regular provider to discuss other options   Acid reflux: had on going sore throat, vocal hoarseness.  -reports does have baseline reflux. Typically on H2B but has not been on recently. He did get H2B in form of pepcid  but did not seem to resolve his reflux issues.  Plan Home on PPI  Re: vocal hoarseness. Note he has had trach in past as well as having been previously intubated. Suspect some of this is reflux related but if not better with reflux therapy would be reasonable to be seen by ENT in out-pt referral setting for a) concern about possible vocal cord injury and b) also possible element of subglottic stenosis. Although does not seem apparent on upper airway exam he at times has to push on old trach site in order to facilitate adequate clearance with cough   Tobacco abuse Plan Continue to encourage cessation   Obesity and Controlled type 2 diabetes mellitus without complication, without long-term current use of insulin  (HCC) Plan Completed steroids so no reason to cont ssi Resume diet control   Hypertension Plan Resume home metoprolol  and ARB  Dizziness P He was seen by PT prior to discharge and outpatient PT has been recommended      Consults      Discharge Exam: BP (!) 131/91 (BP Location: Left Arm)   Pulse 84   Temp (!) 97.4 F (36.3 C) (Oral)   Resp 19   Ht 6' 2 (1.88 m)   Wt (!) 141.5 kg   SpO2 98%   BMI 40.05 kg/m     General- obese wdwn M NAD Neuro- AAOx4 Psych- anxious mood and affect HEENT- NCAT. Healed trach scar Pulm- even unlabored on RA CV- rrr GI- round MSK- no obvious acute deformity     Labs at discharge   Lab Results  Component Value Date   CREATININE 1.04 05/21/2024   BUN 15 05/21/2024   NA 137 05/21/2024   K 3.9 05/21/2024   CL 106 05/21/2024   CO2 22 05/21/2024   Lab Results   Component Value Date   WBC 19.3 (H) 05/21/2024   HGB 12.9 (L) 05/21/2024   HCT 38.1 (L) 05/21/2024   MCV 92.7 05/21/2024   PLT 265 05/21/2024   Lab Results  Component Value Date   ALT 26 05/19/2024   AST 25 05/19/2024   ALKPHOS 66 05/19/2024   BILITOT 0.6 05/19/2024   Lab Results  Component Value Date   INR 1.14 02/19/2016   INR 1.09 02/15/2016   INR 1.09 02/14/2016    Current radiological studies    DG Chest Portable 1 View Result Date: 05/19/2024 CLINICAL DATA:  Chest pain. Anaphylaxis after a bee sting. Shortness of breath. EXAM: PORTABLE CHEST 1 VIEW COMPARISON:  12/26/2023 FINDINGS: Cardiac enlargement. Mild central vascular congestion. Perihilar and basilar interstitial changes may indicate early edema. Bronchiectasis with bronchial wall thickening consistent with airways disease. No focal consolidation. No pleural effusion or pneumothorax. Mediastinal contours appear intact. Postoperative fixation of multiple left  ribs. IMPRESSION: Cardiac enlargement with mild vascular congestion and perihilar interstitial edema. Bronchitic changes. Electronically Signed   By: Elsie Gravely M.D.   On: 05/19/2024 19:58    Disposition:        Allergies as of 05/21/2024       Reactions   Bee Venom Shortness Of Breath, Swelling, Anaphylaxis   Arm swells   Hornet Venom Anaphylaxis, Shortness Of Breath, Swelling   Arm swells        Medication List     STOP taking these medications    olmesartan 40 MG tablet Commonly known as: BENICAR   sildenafil  20 MG tablet Commonly known as: REVATIO    Wegovy 0.25 MG/0.5ML Soaj Generic drug: Semaglutide-Weight Management       TAKE these medications    albuterol  108 (90 Base) MCG/ACT inhaler Commonly known as: VENTOLIN  HFA Inhale 1-2 puffs into the lungs every 6 (six) hours as needed for wheezing or shortness of breath.   ALPRAZolam  1 MG tablet Commonly known as: Xanax  Take 1 tablet (1 mg total) by mouth 3 (three) times  daily as needed for up to 30 doses for anxiety.   cetirizine  10 MG tablet Commonly known as: ZYRTEC  Take 10 mg by mouth daily.   diphenhydrAMINE  25 MG tablet Commonly known as: BENADRYL  Take 25-50 mg by mouth every 6 (six) hours as needed for itching or allergies.   EPINEPHrine  0.3 mg/0.3 mL Soaj injection Commonly known as: EPI-PEN Inject 0.3 mg into the muscle as needed for anaphylaxis. As needed for life-threatening allergic reactions   famotidine  40 MG tablet Commonly known as: PEPCID  Take 1 tablet (40 mg total) by mouth 2 (two) times daily for 2 days.   fentaNYL  12 MCG/HR Commonly known as: DURAGESIC  Place 1 patch onto the skin every 3 (three) days. For long acting pain relief- to take the place of MS Contin /Oxycontin - since they are both not available-   fexofenadine  180 MG tablet Commonly known as: ALLEGRA  Take 1 tablet (180 mg total) by mouth daily.   fluticasone  50 MCG/ACT nasal spray Commonly known as: FLONASE  Place 1 spray into both nostrils at bedtime. What changed:  when to take this reasons to take this   gabapentin  300 MG capsule Commonly known as: NEURONTIN  TAKE 3 CAPSULES IN THE MORNING, 3 CAPSULES AT LUNCH, AND 3 CAPSULES ARE BEDTIME   lidocaine  5 % Commonly known as: LIDODERM  Place 3 patches onto the skin daily. Remove & Discard patch within 12 hours or as directed by MD What changed:  when to take this reasons to take this additional instructions   meclizine 25 MG tablet Commonly known as: ANTIVERT Take 25 mg by mouth daily as needed for dizziness or nausea.   metaxalone  800 MG tablet Commonly known as: SKELAXIN  Take 1 tablet (800 mg total) by mouth 3 (three) times daily as needed for muscle spasms.   Naproxen  DR 500 MG Tbec TAKE 1 TABLET BY MOUTH TWICE A DAY   Oxycodone  HCl 10 MG Tabs Take 1 tablet (10 mg total) by mouth 4 (four) times daily as needed. What changed:  how much to take when to take this   pantoprazole  40 MG  tablet Commonly known as: PROTONIX  Take 1 tablet (40 mg total) by mouth daily.   promethazine  25 MG tablet Commonly known as: PHENERGAN  Take 25 mg by mouth every 6 (six) hours as needed for nausea or vomiting.   sertraline  100 MG tablet Commonly known as: ZOLOFT  Take 100 mg by mouth daily.  SUMAtriptan  25 MG tablet Commonly known as: IMITREX  Take 25 mg by mouth daily as needed for migraine or headache.   tadalafil  5 MG tablet Commonly known as: CIALIS  Take 5 mg by mouth daily as needed for erectile dysfunction.   telmisartan 40 MG tablet Commonly known as: MICARDIS Take 40 mg by mouth every morning.   Testosterone  1.62 % Gel Apply 1 Pump topically daily.   triamcinolone  cream 0.1 % Commonly known as: KENALOG Apply 1 Application topically 2 (two) times daily as needed (for rash).         Follow-up appointment   Follow up with your PCP in 1-2 weeks  Discharge Condition:    stable  Physician Statement:   The Patient was personally examined, the discharge assessment and plan has been personally reviewed and I agree with ACNP Krisy Dix's assessment and plan. 30 minutes of time have been dedicated to discharge assessment, planning and discharge instructions.     Ronnald Gave MSN, AGACNP-BC [facility] Amion for pager  05/21/2024, 12:55 PM

## 2024-05-20 NOTE — Progress Notes (Signed)
 Off epi Hemodynamics stable Continues to c/o sore throat and does have vocal hoarseness Endorses h/o reflux  Still has break thru pain. We restarted his fent and other meds this am Plan Dc epi from meds 1 more dose pred in am Stop date placed on H2B Adding PPI. He has sig vocal hoarseness. I think his upper airway complaints are reflux  Will move him out

## 2024-05-20 NOTE — Progress Notes (Signed)
 Interim CCM Progress Note  General: acute on chronic adult male, sitting up in ICU bed in NAD  HEENT: Normocephalic, PERRLA intact, Pink MM, scaring at mid neck- previous trache site CV: s1,s2, RRR, no MRG, No JVD  pulm: clear, diminished, no distress on 4L [place]- O2 sats 99%  Abs: bs active, soft  Extremities: no edema, no deformity, moves all extremities on command Skin: no rash  Neuro: Rass 0 to -1,oriented x4, follows commands  GU: deferred    Anaphylaxis  Called to reassess patient at beside due to complaints of worsening throat irritation-Concern for refractory anaphylaxis despite interventions- benadryl , epi gtt, steroids  Upon arrival to beside, patient's VSS and in no distress. Auscultated lung fields, no evidence of stridor, clear throughout.  Patient appears to be slightly anxious from past experience with anaphylaxis as well as chronic pain syndrome  P: Continue airway watch for now  Continue Oxygen currently 4 Liters [place], O2 goal > 92%  Will order racemic epi neb to assist with complaints of throat irritation Continue pepcid , benadryl , epi gtt, and steroids for now  Continue PRN pain regimen for now   Sherlean Sharps AGACNP-BC   [facility] 05/20/2024, 5:45 AM  Please see [email or web address] for pager details.  From 7A-7P if no response, please call [phone]. After hours, please call ELink [phone].

## 2024-05-20 NOTE — Progress Notes (Signed)
 NAME:  [name], MRN:  [id number], DOB:  07-25-1976, LOS: 1 ADMISSION DATE:  05/19/2024, CONSULTATION DATE: 05/19/2024 REFERRING MD: Albertina Dixon, MD, CHIEF COMPLAINT: SOB 2 hrs before ED presentation    History of Present Illness:  A 48 yr old male patient who has PTSD, HTN, diet controlled DM-2, chronic pain syndrome, and well controlled asthma, who was stung by bees happened on back of neck. On arrival pt was had SOB, nausea, dizziness, tingling in the extremities, and weakness. He took his epi pen 0.3 mg and 25mg  benadryl  po. EMS gave 2mg  of epi, 25 benadryl , 250 cc NS 0.9%. En route pt became cyanotic, another 0.5 mg epi given. EMS also reported bilateral diminished breath sounds and wheezing. BP 120 systolic, HR 100, Cap 32, SpO2 96% RA. Placed on Epi drip @ 1 mcg/min and given Decadron  10 mg IV x1, Benadryl  25 mg IV x1, Fentanyl  50 mcg IV x1, Toradol  15 mg IV x1, Ativan  0.5 mg IV x1, and Compazine  10 mg IV x1. Vaping. No tobacco smoking, alcohol drinking, or illicit drug use.   Pertinent  Medical History  PTSD, HTN, diet controlled DM-2, chronic pain syndrome, well controlled asthma, morbid obesity   Significant Hospital Events: Including procedures, antibiotic start and stop dates in addition to other pertinent events   05/19/2024: ED eval and Rx as above 7/31 weaning epinephrine  starting diet  Interim History / Subjective:   Still reports some painful swallowing.  No difficulty breathing.  Also having a headache.  Multiple somatic complaints but nothing really suggesting airway compromise Objective    Blood pressure 119/65, pulse 93, temperature (!) 96.8 F (36 C), temperature source Axillary, resp. rate 15, height 6' 2 (1.88 m), weight (!) 141.5 kg, SpO2 96%.        Intake/Output Summary (Last 24 hours) at 05/20/2024 0844 Last data filed at 05/20/2024 0700 Gross per 24 hour  Intake 756.65 ml  Output 1265 ml  Net -508.35 ml   Filed Weights   05/19/24 1925 05/20/24 0500   Weight: (!) 141.5 kg (!) 141.5 kg    Examination: General This is a 48 year old white male he sitting up in bed he is in no acute distress this morning HEENT normal cephalic atraumatic speech is clear tongue is not swollen lips are not swollen uvula is easily visualized no oral secretions no stridor Pulmonary clear to auscultation on room air Cardiac regular rate and rhythm Abdomen is soft and nontender Extremities are warm and dry Neuro intact  Resolved problem list   Assessment and Plan  Anaphylactic reaction for bees stings w/ possible biphasic reaction Clinically better but reports still feels like difficulty swallowing.  No tongue swelling Speech clear No stridor Plan We can initiate epi wean. Will decrease by 1 mcg/min every 30min to maintain MAP > 65 Cont steroids. (Will get 80mg  solumedrol today).   Cont H2 blockers PRN Nebs Cont IVFs Cont to monitor in ICU for now. Will re-assess this afternoon OK to start diet   PTSD w/ chronic pain and situational anxiety  Plan We have resumed his home meds in effort to avoid potential wd   HTN Plan Holding for now   Steroid-induced hyperglycemia with history of diet controlled DM-2 Plan SSI,Change to resistant scale   Well controlled asthma Plan Duoneb PRN  Morbid obesity, ?OSA Plan Needs outpatient NPSG   Best Practice (right click and Reselect all SmartList Selections daily)   Diet/type: Regular consistency (see orders) DVT prophylaxis LMWH Pressure ulcer(s): N/A  GI prophylaxis: H2B Lines: N/A Foley:  N/A Code Status:  full code Last date of multidisciplinary goals of care discussion []   Critical care time: 35 min

## 2024-05-20 NOTE — Plan of Care (Signed)
  Problem: Coping: Goal: Ability to adjust to condition or change in health will improve Outcome: Progressing   Problem: Fluid Volume: Goal: Ability to maintain a balanced intake and output will improve Outcome: Progressing   Problem: Health Behavior/Discharge Planning: Goal: Ability to identify and utilize available resources and services will improve Outcome: Progressing Goal: Ability to manage health-related needs will improve Outcome: Progressing

## 2024-05-21 ENCOUNTER — Other Ambulatory Visit (HOSPITAL_COMMUNITY): Payer: Self-pay

## 2024-05-21 LAB — CBC
HCT: 38.1 % — ABNORMAL LOW (ref 39.0–52.0)
Hemoglobin: 12.9 g/dL — ABNORMAL LOW (ref 13.0–17.0)
MCH: 31.4 pg (ref 26.0–34.0)
MCHC: 33.9 g/dL (ref 30.0–36.0)
MCV: 92.7 fL (ref 80.0–100.0)
Platelets: 265 K/uL (ref 150–400)
RBC: 4.11 MIL/uL — ABNORMAL LOW (ref 4.22–5.81)
RDW: 13.8 % (ref 11.5–15.5)
WBC: 19.3 K/uL — ABNORMAL HIGH (ref 4.0–10.5)
nRBC: 0 % (ref 0.0–0.2)

## 2024-05-21 LAB — GLUCOSE, CAPILLARY
Glucose-Capillary: 226 mg/dL — ABNORMAL HIGH (ref 70–99)
Glucose-Capillary: 135 mg/dL — ABNORMAL HIGH (ref 70–99)

## 2024-05-21 LAB — BASIC METABOLIC PANEL WITH GFR
Anion gap: 9 (ref 5–15)
BUN: 15 mg/dL (ref 6–20)
CO2: 22 mmol/L (ref 22–32)
Calcium: 9.1 mg/dL (ref 8.9–10.3)
Chloride: 106 mmol/L (ref 98–111)
Creatinine, Ser: 1.04 mg/dL (ref 0.61–1.24)
GFR, Estimated: >60 mL/min (ref >60)
Glucose, Bld: 181 mg/dL — ABNORMAL HIGH (ref 70–99)
Potassium: 3.9 mmol/L (ref 3.5–5.1)
Sodium: 137 mmol/L (ref 135–145)

## 2024-05-21 NOTE — Progress Notes (Signed)
 Seen today-- think he is likely ready for discharge but c/o some vague and variable dizziness. He says he is ambulating around his room without change. Does not feel faint.   I am going to put in for a PT consult to have him see, imminent dc. If he is safe, will dc. Lives w his son.   If he is stable for dc, we talked about PCP follow up as well as return ED precautions    Ronnald Gave MSN, AGACNP-BC [facility] 05/21/2024, 11:16 AM
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
## 2024-05-21 NOTE — Care Plan (Signed)
  05/21/2024  To whom it may concern   Mr. Parodi is recovery from an acute illness which will require home rest through 05/25/2024, and he will require work absence during that time to facilitate recovery.   Thank you,       Ronnald Gave MSN, AGACNP-BC [facility] 05/21/2024, 1:40 PM

## 2024-05-21 NOTE — TOC Transition Note (Signed)
 Transition of Care [facility]) - Discharge Note   Patient Details  Name: [name] MRN: [id number] Date of Birth: 1976/07/27  Transition of Care [facility]) CM/SW Contact:  Tom-Johnson, Breeona Waid Daphne, RN Phone Number: 05/21/2024, 1:36 PM   Clinical Narrative:     Patient is scheduled for discharge today.  Readmission Risk Assessment done. Outpatient PT, hospital f/u, Outpatient Psychiatry & Counseling resources  and discharge instructions on AVS. Son, Lang to transport at discharge.  No further TOC needs noted.        Final next level of care: OP Rehab Barriers to Discharge: Barriers Resolved   Patient Goals and CMS Choice Patient states their goals for this hospitalization and ongoing recovery are:: To return home CMS Medicare.gov Compare Post Acute Care list provided to:: Patient Choice offered to / list presented to : Patient      Discharge Placement                Patient to be transferred to facility by: Son Name of family member notified: Lang    Discharge Plan and Services Additional resources added to the After Visit Summary for                  DME Arranged: N/A DME Agency: NA       HH Arranged: NA HH Agency: NA        Social Drivers of Health (SDOH) Interventions SDOH Screenings   Food Insecurity: No Food Insecurity (05/19/2024)  Housing: Low Risk  (05/20/2024)  Transportation Needs: No Transportation Needs (05/19/2024)  Utilities: Not At Risk (05/19/2024)  Depression (PHQ2-9): Low Risk  (12/25/2023)  Social Connections: Socially Isolated (05/19/2024)  Tobacco Use: High Risk (05/19/2024)     Readmission Risk Interventions    05/21/2024    1:35 PM  Readmission Risk Prevention Plan  Post Dischage Appt Complete  Medication Screening Complete  Transportation Screening Complete

## 2024-05-23 ENCOUNTER — Emergency Department (HOSPITAL_COMMUNITY)
Admission: EM | Admit: 2024-05-23 | Discharge: 2024-05-24 | Disposition: A | Discharge status: Home / Self Care | Attending: Emergency Medicine | Admitting: Emergency Medicine

## 2024-05-23 ENCOUNTER — Other Ambulatory Visit: Payer: Self-pay

## 2024-05-23 DIAGNOSIS — Z85528 Personal history of other malignant neoplasm of kidney: Secondary | ICD-10-CM | POA: Diagnosis not present

## 2024-05-23 DIAGNOSIS — I10 Essential (primary) hypertension: Secondary | ICD-10-CM | POA: Diagnosis not present

## 2024-05-23 DIAGNOSIS — J45909 Unspecified asthma, uncomplicated: Secondary | ICD-10-CM | POA: Insufficient documentation

## 2024-05-23 DIAGNOSIS — R7309 Other abnormal glucose: Secondary | ICD-10-CM | POA: Insufficient documentation

## 2024-05-23 DIAGNOSIS — T782XXA Anaphylactic shock, unspecified, initial encounter: Secondary | ICD-10-CM | POA: Diagnosis not present

## 2024-05-23 DIAGNOSIS — R519 Headache, unspecified: Secondary | ICD-10-CM | POA: Diagnosis present

## 2024-05-23 LAB — URINALYSIS, ROUTINE W REFLEX MICROSCOPIC
Bilirubin Urine: NEGATIVE
Glucose, UA: NEGATIVE mg/dL
Hgb urine dipstick: NEGATIVE
Ketones, ur: NEGATIVE mg/dL
Leukocytes,Ua: NEGATIVE
Nitrite: NEGATIVE
Protein, ur: NEGATIVE mg/dL
Specific Gravity, Urine: 1.003 — ABNORMAL LOW (ref 1.005–1.030)
pH: 7 (ref 5.0–8.0)

## 2024-05-23 LAB — CBG MONITORING, ED: Glucose-Capillary: 166 mg/dL — ABNORMAL HIGH (ref 70–99)

## 2024-05-23 MED ORDER — DIPHENHYDRAMINE HCL 50 MG/ML IJ SOLN
50.0000 mg | Freq: Once | INTRAMUSCULAR | Status: AC
  Administered 2024-05-23: 50 mg via INTRAVENOUS

## 2024-05-23 MED ORDER — ONDANSETRON HCL 4 MG/2ML IJ SOLN
INTRAMUSCULAR | Status: AC
Start: 2024-05-23 — End: 2024-05-23
  Filled 2024-05-23: qty 2

## 2024-05-23 MED ORDER — LORAZEPAM 2 MG/ML IJ SOLN
INTRAMUSCULAR | Status: AC
Start: 2024-05-23 — End: 2024-05-23
  Filled 2024-05-23: qty 1

## 2024-05-23 MED ORDER — MAGNESIUM SULFATE 2 GM/50ML IV SOLN
2.0000 g | Freq: Once | INTRAVENOUS | Status: AC
  Administered 2024-05-23: 2 g via INTRAVENOUS
  Filled 2024-05-23: qty 50

## 2024-05-23 MED ORDER — LORAZEPAM 2 MG/ML IJ SOLN
1.0000 mg | Freq: Once | INTRAMUSCULAR | Status: AC
  Administered 2024-05-23: 1 mg via INTRAVENOUS

## 2024-05-23 MED ORDER — FAMOTIDINE IN NACL 20-0.9 MG/50ML-% IV SOLN
20.0000 mg | Freq: Once | INTRAVENOUS | Status: AC
  Administered 2024-05-23: 20 mg via INTRAVENOUS

## 2024-05-23 MED ORDER — ONDANSETRON HCL 4 MG/2ML IJ SOLN
4.0000 mg | Freq: Once | INTRAMUSCULAR | Status: AC
  Administered 2024-05-23: 4 mg via INTRAVENOUS

## 2024-05-23 MED ORDER — PROCHLORPERAZINE EDISYLATE 10 MG/2ML IJ SOLN
10.0000 mg | Freq: Once | INTRAMUSCULAR | Status: AC
  Administered 2024-05-23: 10 mg via INTRAVENOUS
  Filled 2024-05-23: qty 2

## 2024-05-23 MED ORDER — KETOROLAC TROMETHAMINE 15 MG/ML IJ SOLN
15.0000 mg | Freq: Once | INTRAMUSCULAR | Status: AC
  Administered 2024-05-23: 15 mg via INTRAVENOUS
  Filled 2024-05-23: qty 1

## 2024-05-23 MED ORDER — SODIUM CHLORIDE 0.9 % IV BOLUS
1000.0000 mL | Freq: Once | INTRAVENOUS | Status: AC
  Administered 2024-05-23: 1000 mL via INTRAVENOUS

## 2024-05-23 MED ORDER — FENTANYL CITRATE PF 50 MCG/ML IJ SOSY
50.0000 ug | PREFILLED_SYRINGE | Freq: Once | INTRAMUSCULAR | Status: AC
  Administered 2024-05-23: 50 ug via INTRAVENOUS
  Filled 2024-05-23: qty 1

## 2024-05-23 MED ORDER — BENZOCAINE 20 % MT AERO
INHALATION_SPRAY | Freq: Once | OROMUCOSAL | Status: AC
  Filled 2024-05-23: qty 57

## 2024-05-23 MED ORDER — LIDOCAINE HCL (PF) 2% IJ FOR NEBU
5.0000 mL | Freq: Once | RESPIRATORY_TRACT | Status: AC
  Administered 2024-05-23: 5 mL via RESPIRATORY_TRACT
  Filled 2024-05-23 (×2): qty 5

## 2024-05-23 MED ORDER — LIDOCAINE HCL 2 % IJ SOLN
INTRAMUSCULAR | Status: AC
  Administered 2024-05-23: 5 mL
  Filled 2024-05-23: qty 20

## 2024-05-23 NOTE — ED Provider Notes (Signed)
 [place] [facility] Provider Note  MDM   HPI/ROS:  [name] is a 48 y.o. male with a medical history as below who presents with concern for anaphylaxis.  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  He gave himself his EpiPen  and took Benadryl  immediately however approximately an hour later he was still feeling poorly with difficulty breathing and a severe headache which is normal for him during these events.  He states the last time it happened was Monday.  He does report he is but had to be intubated for this in the past.  EMS reports 2 additional rounds of epinephrine  and Solu-Medrol  was given.  Physical exam is notable for: - Respiratory distress with prolonged expiration, scleral injection, tongue swelling, communicate in short sentences with clear lung sounds bilaterally.  No stridor.  On my initial evaluation, patient is:  -Vital signs stable. Patient afebrile, hemodynamically stable, and ill appearing. -Additional history obtained from EMS  This patient's current presentation, including their history and physical exam, is most consistent with anaphylaxis. Differentials include allergic reaction, anaphylaxis, epiglottitis, tracheitis, asthma.    On my physical exam he is saturating well on room air.  He does appear to have a hoarse voice however his great breath sounds bilaterally without wheezing or stridor.  He does appear to have some tongue swelling and states that he is feeling swelling in his throat.  He has some splotchiness but no obvious urticarial rash.  No abdominal pain but does have some nausea and a headache.  Additional Benadryl  and Pepcid  were given as well as fentanyl  and Ativan .  Patient appears anxious and reports history of the same.***  Interpretations, interventions, and the patient's course of care are documented below.      ***   Disposition:  {ED  Dispo:29898}  Clinical Impression:  1. Anaphylaxis, initial encounter     Rx / DC Orders ED Discharge Orders     None       The plan for this patient was discussed with Dr. Franklyn, who voiced agreement and who oversaw evaluation and treatment of this patient.   Clinical Complexity A medically appropriate history, review of systems, and physical exam was performed.  My independent interpretations of EKG, labs, and radiology are documented in the ED course above.   If decision rules were used in this patient's evaluation, they are listed below.   Click here for ABCD2, HEART and other calculatorsREFRESH Note before signing   Patient's presentation is most consistent with acute presentation with potential threat to life or bodily function.  Medical Decision Making Risk OTC drugs. Prescription drug management.    HPI/ROS      See MDM section for pertinent HPI and ROS. A complete ROS was performed with pertinent positives/negatives noted above.   Past Medical History:  Diagnosis Date   Anxiety and depression 08/01/2013   Arthritis    Asthma    Cellulitis    left leg and stomach   Chicken pox    Chronic pain    Diarrhea 08/01/2013   History of kidney cancer    Hx of vasectomy    Hypertension    Renal cell carcinoma (HCC) 06/01/2013    Past Surgical History:  Procedure Laterality Date   ABLATION     Lumbar   COLONOSCOPY WITH PROPOFOL  N/A 08/19/2013   Procedure: COLONOSCOPY WITH PROPOFOL ;  Surgeon: Toribio SHAUNNA Cedar, MD;  Location: [facility];  Service: Endoscopy;  Laterality: N/A;   ESOPHAGOGASTRODUODENOSCOPY (EGD) WITH PROPOFOL  N/A 08/19/2013   Procedure: ESOPHAGOGASTRODUODENOSCOPY (EGD) WITH PROPOFOL ;  Surgeon: Toribio SHAUNNA Cedar, MD;  [facility]: [facility];  Service: Endoscopy;  Laterality: N/A;   INTUBATION-ENDOTRACHEAL WITH TRACHEOSTOMY STANDBY N/A 05/07/2023   Procedure: INTUBATION-ENDOTRACHEAL WITH TRACHEOSTOMY STANDBY;  Surgeon: Paola Dreama SAILOR, MD;   Location: [facility];  Service: General;  Laterality: N/A;   IR RADIOLOGIST EVAL & MGMT  05/20/2019   KNEE ARTHROSCOPY Left 07/13/2020   Procedure: left knee arthroscopy, debridement, cyst decompression;  Surgeon: Addie Cordella Hamilton, MD;  Location: [facility];  Service: Orthopedics;  Laterality: Left;   ORIF MANDIBULAR FRACTURE N/A 02/16/2016   Procedure: OPEN REDUCTION INTERNAL FIXATION (ORIF) MANDIBULAR FRACTURE;  Surgeon: Norleen Notice, MD;  Location: [facility] OR;  Service: ENT;  Laterality: N/A;   RIB PLATING Left 02/20/2016   Procedure: LEFT RIB PLATING;  Surgeon: Maude Fleeta Ochoa, MD;  Location: [facility] OR;  Service: Thoracic;  Laterality: Left;   ROBOTIC ASSITED PARTIAL NEPHRECTOMY Left 06/17/2013   Procedure: ROBOTIC ASSITED PARTIAL NEPHRECTOMY;  Surgeon: Noretta Ferrara, MD;  [facility];  Service: Urology;  Laterality: Left;   TRACHEOSTOMY TUBE PLACEMENT N/A 02/16/2016   Procedure: TRACHEOSTOMY;  Surgeon: Norleen Notice, MD;  Location: [facility] OR;  Service: ENT;  Laterality: N/A;   VASECTOMY  10/21/2010   WISDOM TOOTH EXTRACTION  middle school   WRIST SURGERY Left middle school   arteries and nerves tangled up      Physical Exam   Vitals:   05/23/24 2138 05/23/24 2145  BP:  130/85  Pulse:  (!) 104  Resp:  (!) 23  Temp: 98.6 F (37 C)   TempSrc: Temporal   SpO2:  95%    Physical Exam Vitals and nursing note reviewed.  Constitutional:      General: He is in acute distress.     Appearance: He is well-developed.  HENT:     Head: Normocephalic and atraumatic.     Mouth/Throat:     Comments: Tongue swelling Eyes:     Conjunctiva/sclera: Conjunctivae normal.  Cardiovascular:     Rate and Rhythm: Normal rate and regular rhythm.     Heart sounds: No murmur heard. Pulmonary:     Effort: Tachypnea, accessory muscle usage and respiratory distress present.     Breath sounds: Normal breath sounds. No stridor. No wheezing.     Comments:  Respiratory distress with prolonged expiration,  scleral injection, communicate in short sentences with clear lung sounds bilaterally.  No stridor. Abdominal:     Palpations: Abdomen is soft.     Tenderness: There is no abdominal tenderness.  Musculoskeletal:        General: No swelling.     Cervical back: Neck supple.  Skin:    General: Skin is warm and dry.     Capillary Refill: Capillary refill takes less than 2 seconds.  Neurological:     Mental Status: He is alert.  Psychiatric:        Mood and Affect: Mood normal.      Procedures   If procedures were preformed on this patient, they are listed below:  Procedures   @BBSIG @   Please note that this documentation was produced with the assistance of voice-to-text technology and may contain errors.

## 2024-05-23 NOTE — ED Triage Notes (Signed)
 Pt bibrems pt stung by 2 yellow jacket on foot 2 hours ago. Hour later took epi pen 35 mins later called ems. Difficulty breathing with ems and swollen tongue.   .5mg  epis x2, 125mg  solumedrol , 2.5 albuterol  x2 with ems Bp 180/120 Hr 113 93% 2l

## 2024-05-24 ENCOUNTER — Other Ambulatory Visit: Payer: Self-pay

## 2024-05-24 ENCOUNTER — Encounter (HOSPITAL_COMMUNITY): Payer: Self-pay

## 2024-05-24 NOTE — ED Provider Notes (Signed)
 1:15 AM Patient reassessed.  No evidence of recurrent symptoms.  Feels well for discharge.  Outpatient follow-up is recommend.  Return precautions discussed.  He appears stable for discharge.   Vicky Charleston, PA-C 05/24/24 0115    Franklyn Sid SAILOR, MD 05/27/24 1020

## 2024-05-28 ENCOUNTER — Telehealth: Payer: Self-pay

## 2024-05-28 DIAGNOSIS — G894 Chronic pain syndrome: Secondary | ICD-10-CM

## 2024-05-28 DIAGNOSIS — G8929 Other chronic pain: Secondary | ICD-10-CM

## 2024-05-28 NOTE — Addendum Note (Signed)
 Addended by: Nitzia Perren M on: 05/28/2024 04:55 PM   Modules accepted: Orders

## 2024-05-28 NOTE — Telephone Encounter (Addendum)
 Patient called for his pain medication refills.   I left a message for him to call back. So we will know what pharmacy to send the refill to, and what state?

## 2024-05-28 NOTE — Telephone Encounter (Signed)
 Patient was in the hospital after being stung by hornets. That was his reason for calling so late in the week.

## 2024-05-31 ENCOUNTER — Telehealth: Payer: Self-pay | Admitting: Registered Nurse

## 2024-05-31 DIAGNOSIS — G8929 Other chronic pain: Secondary | ICD-10-CM

## 2024-05-31 DIAGNOSIS — G894 Chronic pain syndrome: Secondary | ICD-10-CM

## 2024-05-31 LAB — GLUCOSE, CAPILLARY: Glucose-Capillary: 164 mg/dL — ABNORMAL HIGH (ref 70–99)

## 2024-05-31 MED ORDER — OXYCODONE HCL 10 MG PO TABS: 10.0000 mg | ORAL_TABLET | Freq: Four times a day (QID) | ORAL | 0 refills | Status: DC | PRN

## 2024-05-31 MED ORDER — FENTANYL 12 MCG/HR TD PT72: 1.0000 | MEDICATED_PATCH | TRANSDERMAL | 0 refills | Status: DC

## 2024-05-31 NOTE — Telephone Encounter (Signed)
 PMP was Reviewed. Last Prescription was filled in June 2025 Last prescriptions was sent to pharmacy in [place].  Fentanyl  and Oxycodone  e-scribed to pharmacy / per LDr Lovorn request.  Call placed to Mr. Selover, no answer. Left message to return the call.

## 2024-06-03 NOTE — Telephone Encounter (Signed)
 Task completed.

## 2024-06-29 ENCOUNTER — Encounter: Attending: Registered Nurse | Admitting: Registered Nurse

## 2024-06-29 ENCOUNTER — Other Ambulatory Visit: Payer: Self-pay | Admitting: Registered Nurse

## 2024-06-29 ENCOUNTER — Encounter: Payer: Self-pay | Admitting: Registered Nurse

## 2024-06-29 VITALS — BP 143/95 | HR 112 | Ht 74.0 in | Wt 325.0 lb

## 2024-06-29 DIAGNOSIS — Z79891 Long term (current) use of opiate analgesic: Secondary | ICD-10-CM | POA: Insufficient documentation

## 2024-06-29 DIAGNOSIS — G8929 Other chronic pain: Secondary | ICD-10-CM | POA: Insufficient documentation

## 2024-06-29 DIAGNOSIS — G894 Chronic pain syndrome: Secondary | ICD-10-CM | POA: Insufficient documentation

## 2024-06-29 DIAGNOSIS — Z5181 Encounter for therapeutic drug level monitoring: Secondary | ICD-10-CM | POA: Diagnosis not present

## 2024-06-29 DIAGNOSIS — M546 Pain in thoracic spine: Secondary | ICD-10-CM | POA: Insufficient documentation

## 2024-06-29 DIAGNOSIS — M5416 Radiculopathy, lumbar region: Secondary | ICD-10-CM | POA: Insufficient documentation

## 2024-06-29 MED ORDER — FENTANYL 12 MCG/HR TD PT72: 1.0000 | MEDICATED_PATCH | TRANSDERMAL | 0 refills | Status: DC

## 2024-06-29 MED ORDER — NAPROXEN DR 500 MG PO TBEC: 1.0000 | DELAYED_RELEASE_TABLET | Freq: Two times a day (BID) | ORAL | 3 refills | Status: DC

## 2024-06-29 MED ORDER — OXYCODONE HCL 10 MG PO TABS: 10.0000 mg | ORAL_TABLET | Freq: Four times a day (QID) | ORAL | 0 refills | Status: DC | PRN

## 2024-06-29 NOTE — Progress Notes (Signed)
 Subjective:    Patient ID: [name], male    DOB: 1976/03/28, 48 y.o.   MRN: [id number]  HPI: [name] is a 48 y.o. male who returns for follow up appointment for chronic pain and medication refill.He states his pain is located in his  mid- lower backradiating into his left lower extremity and bilateral feet L>R. He rates his pain 7. His current exercise regime is walking and performing stretching exercises.  [name] Morphine  equivalent is 88.80 MME.   Oral Swab was Performed today.      Pain Inventory Average Pain 7 Pain Right Now 7 My pain is sharp, dull, stabbing, tingling, and aching  In the last 24 hours, has pain interfered with the following? General activity 6 Relation with others 6 Enjoyment of life 8 What TIME of day is your pain at its worst? morning , daytime, evening, and night Sleep (in general) Poor  Pain is worse with: walking, bending, sitting, standing, and some activites Pain improves with: rest, heat/ice, and medication Relief from Meds: na  Family History  Problem Relation Age of Onset   Alcohol abuse Mother    Cirrhosis Mother    Cirrhosis Father    Colitis Father    Heart disease Father    Asthma Father    Other Father        Chemical Imbalance   Colon polyps Sister        intestinal problems   Other Brother        Intestinal Fissure   Diabetes Maternal Grandfather    Prostate cancer Paternal Grandfather    Asthma Son    Heart attack Other        Paternal Grandparents   Stroke Other        Paternal Grandparents   Social History   Socioeconomic History   Marital status: Divorced    Spouse name: Not on file   Number of children: 3   Years of education: Not on file   Highest education level: Not on file  Occupational History   Not on file  Tobacco Use   Smoking status: Former    Current packs/day: 1.00    Average packs/day: 1 pack/day for 24.0 years (24.0 ttl pk-yrs)    Types: Cigarettes   Smokeless tobacco: Current   Vaping Use   Vaping status: Every Day  Substance and Sexual Activity   Alcohol use: Not Currently    Alcohol/week: 0.0 standard drinks of alcohol   Drug use: No   Sexual activity: Not on file  Other Topics Concern   Not on file  Social History Narrative   ** Merged History Encounter **          Right handed    Lives on 2nd floor   Social Drivers of Health   Financial Resource Strain: Not on file  Food Insecurity: No Food Insecurity (05/19/2024)   Hunger Vital Sign    Worried About Running Out of Food in the Last Year: Never true    Ran Out of Food in the Last Year: Never true  Transportation Needs: No Transportation Needs (05/19/2024)   PRAPARE - Administrator, Civil Service (Medical): No    Lack of Transportation (Non-Medical): No  Physical Activity: Not on file  Stress: Not on file  Social Connections: Socially Isolated (05/19/2024)   Social Connection and Isolation Panel    Frequency of Communication with Friends and Family: Twice a week    Frequency of Social  Gatherings with Friends and Family: Twice a week    Attends Religious Services: Never    Database administrator or Organizations: No    Attends Banker Meetings: Never    Marital Status: Divorced   Past Surgical History:  Procedure Laterality Date   ABLATION     Lumbar   COLONOSCOPY WITH PROPOFOL  N/A 08/19/2013   Procedure: COLONOSCOPY WITH PROPOFOL ;  Surgeon: Toribio SHAUNNA Cedar, MD;  [facility];  Service: Endoscopy;  Laterality: N/A;   ESOPHAGOGASTRODUODENOSCOPY (EGD) WITH PROPOFOL  N/A 08/19/2013   Procedure: ESOPHAGOGASTRODUODENOSCOPY (EGD) WITH PROPOFOL ;  Surgeon: Toribio SHAUNNA Cedar, MD;  [facility]: [facility];  Service: Endoscopy;  Laterality: N/A;   INTUBATION-ENDOTRACHEAL WITH TRACHEOSTOMY STANDBY N/A 05/07/2023   Procedure: INTUBATION-ENDOTRACHEAL WITH TRACHEOSTOMY STANDBY;  Surgeon: Paola Dreama SAILOR, MD;  Location: [facility];  Service: General;  Laterality: N/A;   IR  RADIOLOGIST EVAL & MGMT  05/20/2019   KNEE ARTHROSCOPY Left 07/13/2020   Procedure: left knee arthroscopy, debridement, cyst decompression;  Surgeon: Addie Cordella Hamilton, MD;  Location: [facility];  Service: Orthopedics;  Laterality: Left;   ORIF MANDIBULAR FRACTURE N/A 02/16/2016   Procedure: OPEN REDUCTION INTERNAL FIXATION (ORIF) MANDIBULAR FRACTURE;  Surgeon: Norleen Notice, MD;  Location: [facility];  Service: ENT;  Laterality: N/A;   RIB PLATING Left 02/20/2016   Procedure: LEFT RIB PLATING;  Surgeon: Maude Fleeta Ochoa, MD;  Location: [facility] OR;  Service: Thoracic;  Laterality: Left;   ROBOTIC ASSITED PARTIAL NEPHRECTOMY Left 06/17/2013   Procedure: ROBOTIC ASSITED PARTIAL NEPHRECTOMY;  Surgeon: Noretta Ferrara, MD;  [facility];  Service: Urology;  Laterality: Left;   TRACHEOSTOMY TUBE PLACEMENT N/A 02/16/2016   Procedure: TRACHEOSTOMY;  Surgeon: Norleen Notice, MD;  Location: [facility] OR;  Service: ENT;  Laterality: N/A;   VASECTOMY  10/21/2010   WISDOM TOOTH EXTRACTION  middle school   WRIST SURGERY Left middle school   arteries and nerves tangled up   Past Surgical History:  Procedure Laterality Date   ABLATION     Lumbar   COLONOSCOPY WITH PROPOFOL  N/A 08/19/2013   Procedure: COLONOSCOPY WITH PROPOFOL ;  Surgeon: Toribio SHAUNNA Cedar, MD;  [facility];  Service: Endoscopy;  Laterality: N/A;   ESOPHAGOGASTRODUODENOSCOPY (EGD) WITH PROPOFOL  N/A 08/19/2013   Procedure: ESOPHAGOGASTRODUODENOSCOPY (EGD) WITH PROPOFOL ;  Surgeon: Toribio SHAUNNA Cedar, MD;  [facility];  Service: Endoscopy;  Laterality: N/A;   INTUBATION-ENDOTRACHEAL WITH TRACHEOSTOMY STANDBY N/A 05/07/2023   Procedure: INTUBATION-ENDOTRACHEAL WITH TRACHEOSTOMY STANDBY;  Surgeon: Paola Dreama SAILOR, MD;  Location: [facility];  Service: General;  Laterality: N/A;   IR RADIOLOGIST EVAL & MGMT  05/20/2019   KNEE ARTHROSCOPY Left 07/13/2020   Procedure: left knee arthroscopy, debridement, cyst decompression;  Surgeon: Addie Cordella Hamilton, MD;  Location: [facility];  Service: Orthopedics;  Laterality: Left;   ORIF MANDIBULAR FRACTURE N/A 02/16/2016   Procedure: OPEN REDUCTION INTERNAL FIXATION (ORIF) MANDIBULAR FRACTURE;  Surgeon: Norleen Notice, MD;  Location: [facility] OR;  Service: ENT;  Laterality: N/A;   RIB PLATING Left 02/20/2016   Procedure: LEFT RIB PLATING;  Surgeon: Maude Fleeta Ochoa, MD;  Location: [facility] OR;  Service: Thoracic;  Laterality: Left;   ROBOTIC ASSITED PARTIAL NEPHRECTOMY Left 06/17/2013   Procedure: ROBOTIC ASSITED PARTIAL NEPHRECTOMY;  Surgeon: Noretta Ferrara, MD;  [facility]: [facility];  Service: Urology;  Laterality: Left;   TRACHEOSTOMY TUBE PLACEMENT N/A 02/16/2016   Procedure: TRACHEOSTOMY;  Surgeon: Norleen Notice, MD;  Location: [facility] OR;  Service: ENT;  Laterality:  N/A;   VASECTOMY  10/21/2010   WISDOM TOOTH EXTRACTION  middle school   WRIST SURGERY Left middle school   arteries and nerves tangled up   Past Medical History:  Diagnosis Date   Anxiety and depression 08/01/2013   Arthritis    Asthma    Cellulitis    left leg and stomach   Chicken pox    Chronic pain    Diarrhea 08/01/2013   History of kidney cancer    Hx of vasectomy    Hypertension    Renal cell carcinoma (HCC) 06/01/2013   BP (!) 143/95   Pulse (!) 112   Ht 6' 2 (1.88 m)   Wt (!) 325 lb (147.4 kg)   SpO2 95%   BMI 41.73 kg/m   Opioid Risk Score:   Fall Risk Score:  `1  Depression screen [facility] 2/9     06/29/2024   11:59 AM 12/25/2023   11:14 AM 10/13/2023    9:43 AM 06/04/2023    1:20 PM 09/16/2022    1:51 PM 06/28/2022    2:49 PM 10/01/2021    2:49 PM  Depression screen PHQ 2/9  Decreased Interest 0 1 0 1 0 0 1  Down, Depressed, Hopeless 0 1 0 1 0 0 3  PHQ - 2 Score 0 2 0 2 0 0 4    Review of Systems  Musculoskeletal:  Positive for back pain.       Objective:   Physical Exam Vitals and nursing note reviewed.  Constitutional:      Appearance: Normal appearance.  Cardiovascular:     Rate and  Rhythm: Regular rhythm. Tachycardia present.     Pulses: Normal pulses.     Heart sounds: Normal heart sounds.  Pulmonary:     Effort: Pulmonary effort is normal.     Breath sounds: Normal breath sounds.  Musculoskeletal:     Comments: Normal Muscle Bulk and Muscle Testing Reveals:  Upper Extremities: Full ROM and Muscle Strength 5/5  Thoracic Paraspinal Tenderness: T-1-T-2 Lumbar Paraspinal Tenderness: L-3-L-5 Bilateral Greater Trochanter Tenderness Lower Extremities: Full ROM and Muscle Strength 5/5 Arises from Table with ease Narrow Based Gait     Skin:    General: Skin is warm and dry.  Neurological:     Mental Status: He is alert and oriented to person, place, and time.  Psychiatric:        Mood and Affect: Mood normal.        Behavior: Behavior normal.           Assessment & Plan:   Lumbar Radiculitis: Continue Gabapentin . Continue HEP as Tolerated . Continue to Monitor. 06/29/2024 2. Chronic Post Thoracotomy Pain: No complaints today. Continue current medication regimen. Continue to monitor. 06/29/2024 3. Chronic Bilateral Thoracic Back Pain: Continue HEP as Tolerated. Continue to monitor.  4. Chronic Pain Syndrome: Refilled: Fentanyl  12 mcg one patch every third day/ 10 and Oxycodone  10 mg one tablet 4  times a day as needed for pain #120.  We will continue the opioid monitoring program, this consists of regular clinic visits, examinations, urine drug screen, pill counts as well as use of [place]  Controlled Substance Reporting system. A 12 month History has been reviewed on the [place]  Controlled Substance Reporting System on 06/29/2024.  3. Myofascial Pain: Continue current medication regimen. Continue to Monitor. 06/29/2024 4. Anxiety: No Complaints today.   Patient reported past history with PTSD. Continue to monitor. 06/29/2024   F/U in 2 months

## 2024-07-01 ENCOUNTER — Other Ambulatory Visit: Payer: Self-pay | Admitting: Registered Nurse

## 2024-07-03 LAB — DRUG TOX MONITOR 1 W/CONF, ORAL FLD
Amphetamines: NEGATIVE ng/mL (ref <10)
Barbiturates: NEGATIVE ng/mL (ref <10)
Benzodiazepines: NEGATIVE ng/mL (ref <0.50)
Buprenorphine: NEGATIVE ng/mL (ref <0.10)
Cocaine: NEGATIVE ng/mL (ref <5.0)
Codeine: NEGATIVE ng/mL (ref <2.5)
Cotinine: >250 ng/mL — ABNORMAL HIGH (ref <5.0)
Dihydrocodeine: NEGATIVE ng/mL (ref <2.5)
Fentanyl: NEGATIVE ng/mL (ref <0.10)
Heroin Metabolite: NEGATIVE ng/mL (ref <1.0)
Hydrocodone: NEGATIVE ng/mL (ref <2.5)
Hydromorphone: NEGATIVE ng/mL (ref <2.5)
MARIJUANA: NEGATIVE ng/mL (ref <2.5)
MDMA: NEGATIVE ng/mL (ref <10)
Meprobamate: NEGATIVE ng/mL (ref <2.5)
Methadone: NEGATIVE ng/mL (ref <5.0)
Morphine: NEGATIVE ng/mL (ref <2.5)
Nicotine Metabolite: POSITIVE ng/mL — AB (ref <5.0)
Norhydrocodone: NEGATIVE ng/mL (ref <2.5)
Noroxycodone: 31 ng/mL — ABNORMAL HIGH (ref <2.5)
Opiates: POSITIVE ng/mL — AB (ref <2.5)
Oxycodone: >250 ng/mL — ABNORMAL HIGH (ref <2.5)
Oxymorphone: NEGATIVE ng/mL (ref <2.5)
Phencyclidine: NEGATIVE ng/mL (ref <10)
Tapentadol: NEGATIVE ng/mL (ref <5.0)
Tramadol: NEGATIVE ng/mL (ref <5.0)
Zolpidem: NEGATIVE ng/mL (ref <5.0)

## 2024-07-03 LAB — DRUG TOX ALC METAB W/CON, ORAL FLD: Alcohol Metabolite: NEGATIVE ng/mL (ref <25)

## 2024-07-27 ENCOUNTER — Telehealth: Payer: Self-pay

## 2024-07-27 NOTE — Telephone Encounter (Addendum)
 Patient called for a refill of the Oxycodone . A Rx should be available for release at the pharmacy. Message has been left  for Zyshonne to call the pharmacy also to call back if needed.

## 2024-08-26 ENCOUNTER — Other Ambulatory Visit: Payer: Self-pay

## 2024-08-26 DIAGNOSIS — G894 Chronic pain syndrome: Secondary | ICD-10-CM

## 2024-08-26 DIAGNOSIS — G8929 Other chronic pain: Secondary | ICD-10-CM

## 2024-08-26 MED ORDER — OXYCODONE HCL 10 MG PO TABS: 10.0000 mg | ORAL_TABLET | ORAL | 0 refills | Status: DC | PRN

## 2024-08-26 MED ORDER — OXYCODONE HCL 10 MG PO TABS
10.0000 mg | ORAL_TABLET | Freq: Four times a day (QID) | ORAL | 0 refills | Status: DC | PRN
Start: 2024-08-26 — End: 2024-08-26

## 2024-08-26 NOTE — Telephone Encounter (Addendum)
 [name]  reported having only one tablet of Oxycodone  10 MG on hand.If possible please send a refill today.  He stated that he has pending surgery on 09/08/2024. And will need to know how to control his pain / medication? The usual post surgical guidelines for pain and medication  were reviewed with [name]. He did not seem to think the process would will work for his situation. Patient will need Dr. Lovorn to call him for go over post op pain & medication guidelines.    Call back Phone: [phone]

## 2024-08-26 NOTE — Telephone Encounter (Signed)
 Dr. Cornelio reply:  I increased [name]'s Pain meds to 6 pills/day for the next month for his surgery- I'm not increasing past that at this time- I also sent in pain meds earlier. Can you make sure that the pharmacy doesn't stop the order because of 2 orders- cancel the 4x/day and let them fil the 6x/day- thanks   Rx discussed with the Pharmacy adjustments made. Patient will be able to pick up an script today.

## 2024-08-26 NOTE — Addendum Note (Signed)
 Addended by: Nafeesa Dils M on: 08/26/2024 11:49 AM   Modules accepted: Orders

## 2024-08-31 ENCOUNTER — Other Ambulatory Visit: Payer: Self-pay | Admitting: Orthopedic Surgery

## 2024-09-03 ENCOUNTER — Encounter: Attending: Registered Nurse | Admitting: Physical Medicine and Rehabilitation

## 2024-09-03 ENCOUNTER — Encounter: Payer: Self-pay | Admitting: Physical Medicine and Rehabilitation

## 2024-09-03 VITALS — BP 135/89 | HR 95 | Ht 74.0 in | Wt 335.4 lb

## 2024-09-03 DIAGNOSIS — F329 Major depressive disorder, single episode, unspecified: Secondary | ICD-10-CM | POA: Diagnosis present

## 2024-09-03 DIAGNOSIS — H8110 Benign paroxysmal vertigo, unspecified ear: Secondary | ICD-10-CM | POA: Insufficient documentation

## 2024-09-03 DIAGNOSIS — G8929 Other chronic pain: Secondary | ICD-10-CM | POA: Insufficient documentation

## 2024-09-03 DIAGNOSIS — G894 Chronic pain syndrome: Secondary | ICD-10-CM | POA: Insufficient documentation

## 2024-09-03 DIAGNOSIS — M546 Pain in thoracic spine: Secondary | ICD-10-CM | POA: Insufficient documentation

## 2024-09-03 DIAGNOSIS — G4452 New daily persistent headache (NDPH): Secondary | ICD-10-CM | POA: Insufficient documentation

## 2024-09-03 MED ORDER — FENTANYL 12 MCG/HR TD PT72: 1.0000 | MEDICATED_PATCH | TRANSDERMAL | 0 refills | Status: DC

## 2024-09-03 MED ORDER — ARIPIPRAZOLE 2 MG PO TABS: 2.0000 mg | ORAL_TABLET | Freq: Every evening | ORAL | 5 refills | Status: DC

## 2024-09-03 MED ORDER — SUMATRIPTAN SUCCINATE 100 MG PO TABS: 100.0000 mg | ORAL_TABLET | ORAL | 5 refills | Status: AC | PRN

## 2024-09-03 MED ORDER — PROMETHAZINE HCL 12.5 MG PO TABS: 12.5000 mg | ORAL_TABLET | Freq: Four times a day (QID) | ORAL | 5 refills | Status: AC | PRN

## 2024-09-03 MED ORDER — CITALOPRAM HYDROBROMIDE 20 MG PO TABS: 20.0000 mg | ORAL_TABLET | Freq: Every day | ORAL | 5 refills | Status: DC
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
## 2024-09-06 ENCOUNTER — Other Ambulatory Visit (HOSPITAL_COMMUNITY)

## 2024-09-06 NOTE — Progress Notes (Signed)
 Surgical Instructions   Your procedure is scheduled on Wednesday, November 19th. Report to [facility] Main Entrance A at 9:15 A.M., then check in with the Admitting office. Any questions or running late day of surgery: call [phone]  Questions prior to your surgery date: call [phone], Monday-Friday, 8am-4pm. If you experience any cold or flu symptoms such as cough, fever, chills, shortness of breath, etc. between now and your scheduled surgery, please notify us  at the above number.     Remember:  Do not eat after midnight the night before your surgery  You may drink clear liquids until 8:45 the morning of your surgery.   Clear liquids allowed are: Water , Non-Citrus Juices (without pulp), Carbonated Beverages, Clear Tea (no milk, honey, etc.), Black Coffee Only (NO MILK, CREAM OR POWDERED CREAMER of any kind), and Gatorade.  Patient Instructions  The night before surgery:  No food after midnight. ONLY clear liquids after midnight  The day of surgery (if you have diabetes): Drink ONE (1) 12 oz G2 given to you in your pre admission testing appointment by 8:45 the morning of surgery. Drink in one sitting. Do not sip.  This drink was given to you during your hospital pre-op appointment visit.   Nothing else to drink after completing the 12 oz bottle of G2.         If you have questions, please contact your surgeon's office.     Take these medicines the morning of surgery with A SIP OF WATER   cetirizine  (ZYRTEC )  citalopram (CELEXA)  famotidine  (PEPCID )  gabapentin  (NEURONTIN )  pantoprazole  (PROTONIX )  sertraline  (ZOLOFT )    May take these medicines IF NEEDED: albuterol  (VENTOLIN  HFA) inhaler - bring with you on day of surgery  ALPRAZolam  (XANAX )  diphenhydrAMINE  (BENADRYL )  EPINEPHrine  pen meclizine (ANTIVERT)   Oxycodone   promethazine  (PHENERGAN )  SUMAtriptan  (IMITREX )   Follow your surgeon's instructions on when to stop metaxalone  (SKELAXIN ).  If no  instructions were given by your surgeon then you will need to call the office to get those instructions.     One week prior to surgery, STOP taking any Aspirin  (unless otherwise instructed by your surgeon) Aleve , Naproxen , Ibuprofen , Motrin , Advil , Goody's, BC's, all herbal medications, fish oil, and non-prescription vitamins.                     Do NOT Smoke (Tobacco/Vaping) for 24 hours prior to your procedure.  If you use a CPAP at night, you may bring your mask/headgear for your overnight stay.   You will be asked to remove any contacts, glasses, piercing's, hearing aid's, dentures/partials prior to surgery. Please bring cases for these items if needed.    Patients discharged the day of surgery will not be allowed to drive home, and someone needs to stay with them for 24 hours.  SURGICAL WAITING ROOM VISITATION Patients may have no more than 2 support people in the waiting area - these visitors may rotate.   Pre-op nurse will coordinate an appropriate time for 1 ADULT support person, who may not rotate, to accompany patient in pre-op.  Children under the age of 24 must have an adult with them who is not the patient and must remain in the main waiting area with an adult.  If the patient needs to stay at the hospital during part of their recovery, the visitor guidelines for inpatient rooms apply.  Please refer to the [facility] website for the visitor guidelines for any additional information.   If  you received a COVID test during your pre-op visit  it is requested that you wear a mask when out in public, stay away from anyone that may not be feeling well and notify your surgeon if you develop symptoms. If you have been in contact with anyone that has tested positive in the last 10 days please notify you surgeon.      Pre-operative 4 CHG Bathing Instructions   You can play a key role in reducing the risk of infection after surgery. Your skin needs to be as free of germs as possible.  You can reduce the number of germs on your skin by washing with CHG (chlorhexidine  gluconate) soap before surgery. CHG is an antiseptic soap that kills germs and continues to kill germs even after washing.   DO NOT use if you have an allergy to chlorhexidine /CHG or antibacterial soaps. If your skin becomes reddened or irritated, stop using the CHG and notify one of our RNs at [phone].   Please shower with the CHG soap starting 4 days before surgery using the following schedule:     Please keep in mind the following:  DO NOT shave, including legs and underarms, starting the day of your first shower.   You may shave your face at any point before/day of surgery.  Place clean sheets on your bed the day you start using CHG soap. Use a clean washcloth (not used since being washed) for each shower. DO NOT sleep with pets once you start using the CHG.   CHG Shower Instructions:  Wash your face and private area with normal soap. If you choose to wash your hair, wash first with your normal shampoo.  After you use shampoo/soap, rinse your hair and body thoroughly to remove shampoo/soap residue.  Turn the water  OFF and apply  bottle of CHG soap to a CLEAN washcloth.  Apply CHG soap ONLY FROM YOUR NECK DOWN TO YOUR TOES (washing for 3-5 minutes)  DO NOT use CHG soap on face, private areas, open wounds, or sores.  Pay special attention to the area where your surgery is being performed.  If you are having back surgery, having someone wash your back for you may be helpful. Wait 2 minutes after CHG soap is applied, then you may rinse off the CHG soap.  Pat dry with a clean towel  Put on clean clothes/pajamas   If you choose to wear lotion, please use ONLY the CHG-compatible lotions that are listed below.  Additional instructions for the day of surgery:  If you choose, you may shower the morning of surgery with an antibacterial soap.  DO NOT APPLY any lotions, deodorants, cologne, or perfumes.    Do not bring valuables to the hospital. [facility] is not responsible for any belongings/valuables. Do not wear nail polish, gel polish, artificial nails, or any other type of covering on natural nails (fingers and toes) Do not wear jewelry or makeup Put on clean/comfortable clothes.  Please brush your teeth.  Ask your nurse before applying any prescription medications to the skin.     CHG Compatible Lotions   Aveeno Moisturizing lotion  Cetaphil Moisturizing Cream  Cetaphil Moisturizing Lotion  Clairol Herbal Essence Moisturizing Lotion, Dry Skin  Clairol Herbal Essence Moisturizing Lotion, Extra Dry Skin  Clairol Herbal Essence Moisturizing Lotion, Normal Skin  Curel Age Defying Therapeutic Moisturizing Lotion with Alpha Hydroxy  Curel Extreme Care Body Lotion  Curel Soothing Hands Moisturizing Hand Lotion  Curel Therapeutic Moisturizing Cream, Fragrance-Free  Curel  Therapeutic Moisturizing Lotion, Fragrance-Free  Curel Therapeutic Moisturizing Lotion, Original Formula  Eucerin Daily Replenishing Lotion  Eucerin Dry Skin Therapy Plus Alpha Hydroxy Crme  Eucerin Dry Skin Therapy Plus Alpha Hydroxy Lotion  Eucerin Original Crme  Eucerin Original Lotion  Eucerin Plus Crme Eucerin Plus Lotion  Eucerin TriLipid Replenishing Lotion  Keri Anti-Bacterial Hand Lotion  Keri Deep Conditioning Original Lotion Dry Skin Formula Softly Scented  Keri Deep Conditioning Original Lotion, Fragrance Free Sensitive Skin Formula  Keri Lotion Fast Absorbing Fragrance Free Sensitive Skin Formula  Keri Lotion Fast Absorbing Softly Scented Dry Skin Formula  Keri Original Lotion  Keri Skin Renewal Lotion Keri Silky Smooth Lotion  Keri Silky Smooth Sensitive Skin Lotion  Nivea Body Creamy Conditioning Oil  Nivea Body Extra Enriched Lotion  Nivea Body Original Lotion  Nivea Body Sheer Moisturizing Lotion Nivea Crme  Nivea Skin Firming Lotion  NutraDerm 30 Skin Lotion  NutraDerm Skin Lotion   NutraDerm Therapeutic Skin Cream  NutraDerm Therapeutic Skin Lotion  ProShield Protective Hand Cream  Provon moisturizing lotion  Please read over the following fact sheets that you were given.

## 2024-09-07 ENCOUNTER — Other Ambulatory Visit: Payer: Self-pay

## 2024-09-07 ENCOUNTER — Inpatient Hospital Stay (HOSPITAL_COMMUNITY)
Admission: RE | Admit: 2024-09-07 | Discharge: 2024-09-07 | Disposition: A | Source: Ambulatory Visit | Attending: Orthopedic Surgery

## 2024-09-07 ENCOUNTER — Encounter (HOSPITAL_COMMUNITY): Payer: Self-pay

## 2024-09-07 ENCOUNTER — Telehealth: Payer: Self-pay

## 2024-09-07 VITALS — BP 147/89 | HR 102 | Temp 98.3°F | Resp 18 | Ht 74.0 in | Wt 334.7 lb

## 2024-09-07 DIAGNOSIS — Z01812 Encounter for preprocedural laboratory examination: Secondary | ICD-10-CM | POA: Diagnosis present

## 2024-09-07 DIAGNOSIS — I10 Essential (primary) hypertension: Secondary | ICD-10-CM | POA: Diagnosis not present

## 2024-09-07 DIAGNOSIS — Z01818 Encounter for other preprocedural examination: Secondary | ICD-10-CM

## 2024-09-07 HISTORY — DX: Post-traumatic stress disorder, unspecified: F43.10

## 2024-09-07 LAB — CBC
HCT: 42.9 % (ref 39.0–52.0)
Hemoglobin: 14.7 g/dL (ref 13.0–17.0)
MCH: 30.5 pg (ref 26.0–34.0)
MCHC: 34.3 g/dL (ref 30.0–36.0)
MCV: 89 fL (ref 80.0–100.0)
Platelets: 262 K/uL (ref 150–400)
RBC: 4.82 MIL/uL (ref 4.22–5.81)
RDW: 12.8 % (ref 11.5–15.5)
WBC: 6.6 K/uL (ref 4.0–10.5)
nRBC: 0 % (ref 0.0–0.2)

## 2024-09-07 LAB — BASIC METABOLIC PANEL WITH GFR
Anion gap: 14 (ref 5–15)
BUN: 10 mg/dL (ref 6–20)
CO2: 24 mmol/L (ref 22–32)
Calcium: 9.1 mg/dL (ref 8.9–10.3)
Chloride: 98 mmol/L (ref 98–111)
Creatinine, Ser: 0.95 mg/dL (ref 0.61–1.24)
GFR, Estimated: >60 mL/min (ref >60)
Glucose, Bld: 120 mg/dL — ABNORMAL HIGH (ref 70–99)
Potassium: 4.2 mmol/L (ref 3.5–5.1)
Sodium: 136 mmol/L (ref 135–145)

## 2024-09-07 LAB — SURGICAL PCR SCREEN
MRSA, PCR: NEGATIVE
Staphylococcus aureus: POSITIVE — AB

## 2024-09-07 MED ORDER — FENTANYL 25 MCG/HR TD PT72: 1.0000 | MEDICATED_PATCH | TRANSDERMAL | 0 refills | Status: DC

## 2024-09-07 NOTE — Pre-Procedure Instructions (Signed)
 PCP - Tarry Sender Cardiologist - denies  PPM/ICD - denies Device Orders - n/a Rep Notified - n/a  Chest x-ray - 05/19/2024 EKG - 05/25/2024 Stress Test - denies ECHO - denies Cardiac Cath - denies  Sleep Study - denies CPAP - n/a  Fasting Blood Sugar - no DM Checks Blood Sugar _____ times a day  Last dose of GLP1 agonist-  July 2025 (has not restarted) GLP1 instructions: hold before surgery  Blood Thinner Instructions: n/a Aspirin  Instructions: n/a  ERAS Protcol -yes; till 0845 am PRE-SURGERY Ensure or G2- ensure  COVID TEST- n/a   Anesthesia review: yes, hx of HTN, kidney disease, recent hospital stay (August 2025), EKG review  Patient denies shortness of breath, fever, cough and chest pain at PAT appointment   All instructions explained to the patient, with a verbal understanding of the material. Patient agrees to go over the instructions while at home for a better understanding. Patient also instructed to self quarantine after being tested for COVID-19. The opportunity to ask questions was provided.  [name]

## 2024-09-07 NOTE — Telephone Encounter (Signed)
 [id number] - [place], [place] - [address] [place] [address] Phone: [phone]  Fax: [phone]   Due to state regulations the Fentanyl  Patch refill cannot be filled until 09/14/2024. He last picked 08/18/2024. Surgery is tomorrow.   Patient state he has been placing 2 patches on  every 3 days since last Friday. He stated he will soon be out of the RX. Please call [place] [phone] or guidance.

## 2024-09-07 NOTE — Pre-Procedure Instructions (Addendum)
 Surgical Instructions   Your procedure is scheduled tomorrow, on November 19th.  Report to [facility] Main Entrance A at 0845 A.M., then check in with the Admitting office. Any questions or running late day of surgery: call [phone]  Questions prior to your surgery date: call [phone], Monday-Friday, 8am-4pm. If you experience any cold or flu symptoms such as cough, fever, chills, shortness of breath, etc. between now and your scheduled surgery, please notify us  at the above number.     Remember:  Do not eat after midnight the night before your surgery   You may drink clear liquids until 0845 the morning of your surgery.   Clear liquids allowed are: Water , Non-Citrus Juices (without pulp), Carbonated Beverages, Clear Tea (no milk, honey, etc.), Black Coffee Only (NO MILK, CREAM OR POWDERED CREAMER of any kind), and Gatorade. Patient Instructions  The night before surgery:  No food after midnight. ONLY clear liquids after midnight  The day of surgery (if you do NOT have diabetes):  Drink ONE (1) Pre-Surgery Clear Ensure by 0845 the morning of surgery. Drink in one sitting. Do not sip.  This drink was given to you during your hospital  pre-op appointment visit.  Nothing else to drink after completing the  Pre-Surgery Clear Ensure.         If you have questions, please contact your surgeon's office.          If you have questions, please contact your surgeon's office.  Take these medicines the morning of surgery with A SIP OF WATER   ARIPiprazole (ABILIFY)  busPIRone  (BUSPAR )        citalopram (CELEXA)  gabapentin  (NEURONTIN )    May take these medicines IF NEEDED: albuterol  (VENTOLIN  HFA) inhaler - bring with you on day of surgery  cetirizine  (ZYRTEC )  diphenhydrAMINE  (BENADRYL )  EPINEPHrine  pen famotidine  (PEPCID )  meclizine (ANTIVERT)   methocarbamol  (ROBAXIN )  Oxycodone   promethazine  (PHENERGAN )  SUMAtriptan  (IMITREX )    Follow your surgeon's  instructions on when to stop metaxalone  (SKELAXIN ).  If no instructions were given by your surgeon then you will need to call the office to get those instructions.    Please hold Semaglutide-weight management (WEGOVY) 7 days prior to surgery.    One week prior to surgery, STOP taking any Aspirin  (unless otherwise instructed by your surgeon) Aleve , Naproxen , Ibuprofen , Motrin , Advil , Goody's, BC's, all herbal medications, fish oil, and non-prescription vitamins.                     Do NOT Smoke (Tobacco/Vaping) for 24 hours prior to your procedure.  If you use a CPAP at night, you may bring your mask/headgear for your overnight stay.   You will be asked to remove any contacts, glasses, piercing's, hearing aid's, dentures/partials prior to surgery. Please bring cases for these items if needed.    Patients discharged the day of surgery will not be allowed to drive home, and someone needs to stay with them for 24 hours.  SURGICAL WAITING ROOM VISITATION Patients may have no more than 2 support people in the waiting area - these visitors may rotate.   Pre-op nurse will coordinate an appropriate time for 1 ADULT support person, who may not rotate, to accompany patient in pre-op.  Children under the age of 50 must have an adult with them who is not the patient and must remain in the main waiting area with an adult.  If the patient needs to stay at the hospital during part of  their recovery, the visitor guidelines for inpatient rooms apply.  Please refer to the [facility] website for the visitor guidelines for any additional information.   If you received a COVID test during your pre-op visit  it is requested that you wear a mask when out in public, stay away from anyone that may not be feeling well and notify your surgeon if you develop symptoms. If you have been in contact with anyone that has tested positive in the last 10 days please notify you surgeon.      Pre-operative CHG Bathing  Instructions   You can play a key role in reducing the risk of infection after surgery. Your skin needs to be as free of germs as possible. You can reduce the number of germs on your skin by washing with CHG (chlorhexidine  gluconate) soap before surgery. CHG is an antiseptic soap that kills germs and continues to kill germs even after washing.   DO NOT use if you have an allergy to chlorhexidine /CHG or antibacterial soaps. If your skin becomes reddened or irritated, stop using the CHG and notify one of our RNs at [phone].              TAKE A SHOWER THE NIGHT BEFORE SURGERY   Please keep in mind the following:  DO NOT shave, including legs and underarms, 48 hours prior to surgery.   You may shave your face before/day of surgery.  Place clean sheets on your bed the night before surgery Use a clean washcloth (not used since being washed) for shower. DO NOT sleep with pet's night before surgery.  CHG Shower Instructions:  Wash your face and private area with normal soap. If you choose to wash your hair, wash first with your normal shampoo.  After you use shampoo/soap, rinse your hair and body thoroughly to remove shampoo/soap residue.  Turn the water  OFF and apply half the bottle of CHG soap to a CLEAN washcloth.  Apply CHG soap ONLY FROM YOUR NECK DOWN TO YOUR TOES (washing for 3-5 minutes)  DO NOT use CHG soap on face, private areas, open wounds, or sores.  Pay special attention to the area where your surgery is being performed.  If you are having back surgery, having someone wash your back for you may be helpful. Wait 2 minutes after CHG soap is applied, then you may rinse off the CHG soap.  Pat dry with a clean towel  Put on clean pajamas    Additional instructions for the day of surgery: If you choose, you may shower the morning of surgery with an antibacterial soap.  DO NOT APPLY any lotions, deodorants, cologne, or perfumes.   Do not wear jewelry or makeup Do not wear nail  polish, gel polish, artificial nails, or any other type of covering on natural nails (fingers and toes) Do not bring valuables to the hospital. [facility] is not responsible for valuables/personal belongings. Put on clean/comfortable clothes.  Please brush your teeth.  Ask your nurse before applying any prescription medications to the skin.

## 2024-09-08 ENCOUNTER — Encounter: Admitting: Physical Medicine and Rehabilitation

## 2024-09-08 ENCOUNTER — Ambulatory Visit: Admitting: Physical Medicine and Rehabilitation

## 2024-09-08 ENCOUNTER — Ambulatory Visit (HOSPITAL_COMMUNITY)
Admission: RE | Admit: 2024-09-08 | Discharge: 2024-09-08 | Disposition: A | Discharge status: Home / Self Care | Attending: Orthopedic Surgery | Admitting: Orthopedic Surgery

## 2024-09-08 ENCOUNTER — Ambulatory Visit (HOSPITAL_BASED_OUTPATIENT_CLINIC_OR_DEPARTMENT_OTHER)

## 2024-09-08 ENCOUNTER — Ambulatory Visit (HOSPITAL_COMMUNITY): Payer: Self-pay | Admitting: Physician Assistant

## 2024-09-08 ENCOUNTER — Ambulatory Visit (HOSPITAL_COMMUNITY)

## 2024-09-08 ENCOUNTER — Other Ambulatory Visit: Payer: Self-pay

## 2024-09-08 ENCOUNTER — Ambulatory Visit (HOSPITAL_COMMUNITY)
Admission: RE | Disposition: A | Discharge status: Home / Self Care | Payer: Self-pay | Source: Home / Self Care | Attending: Orthopedic Surgery

## 2024-09-08 ENCOUNTER — Encounter (HOSPITAL_COMMUNITY): Payer: Self-pay | Admitting: Orthopedic Surgery

## 2024-09-08 DIAGNOSIS — M1990 Unspecified osteoarthritis, unspecified site: Secondary | ICD-10-CM | POA: Insufficient documentation

## 2024-09-08 DIAGNOSIS — F17290 Nicotine dependence, other tobacco product, uncomplicated: Secondary | ICD-10-CM | POA: Diagnosis not present

## 2024-09-08 DIAGNOSIS — K219 Gastro-esophageal reflux disease without esophagitis: Secondary | ICD-10-CM | POA: Diagnosis not present

## 2024-09-08 DIAGNOSIS — M48061 Spinal stenosis, lumbar region without neurogenic claudication: Secondary | ICD-10-CM | POA: Diagnosis not present

## 2024-09-08 DIAGNOSIS — E119 Type 2 diabetes mellitus without complications: Secondary | ICD-10-CM | POA: Insufficient documentation

## 2024-09-08 DIAGNOSIS — F17210 Nicotine dependence, cigarettes, uncomplicated: Secondary | ICD-10-CM

## 2024-09-08 DIAGNOSIS — Z7985 Long-term (current) use of injectable non-insulin antidiabetic drugs: Secondary | ICD-10-CM | POA: Insufficient documentation

## 2024-09-08 DIAGNOSIS — I10 Essential (primary) hypertension: Secondary | ICD-10-CM | POA: Diagnosis not present

## 2024-09-08 DIAGNOSIS — M48062 Spinal stenosis, lumbar region with neurogenic claudication: Secondary | ICD-10-CM | POA: Diagnosis present

## 2024-09-08 DIAGNOSIS — Z85528 Personal history of other malignant neoplasm of kidney: Secondary | ICD-10-CM | POA: Diagnosis not present

## 2024-09-08 DIAGNOSIS — J45909 Unspecified asthma, uncomplicated: Secondary | ICD-10-CM | POA: Insufficient documentation

## 2024-09-08 HISTORY — PX: LUMBAR LAMINECTOMY/DECOMPRESSION MICRODISCECTOMY: SHX5026

## 2024-09-08 SURGERY — LUMBAR LAMINECTOMY/DECOMPRESSION MICRODISCECTOMY 1 LEVEL
Anesthesia: General | Site: Back

## 2024-09-08 MED ORDER — POVIDONE-IODINE 7.5 % EX SOLN
Freq: Once | CUTANEOUS | Status: DC
  Filled 2024-09-08: qty 118

## 2024-09-08 MED ORDER — ONDANSETRON HCL 4 MG/2ML IJ SOLN: 4.0000 mg | Freq: Once | INTRAMUSCULAR | Status: DC | PRN

## 2024-09-08 MED ORDER — OXYCODONE HCL 5 MG PO TABS
5.0000 mg | ORAL_TABLET | Freq: Once | ORAL | Status: AC | PRN
  Administered 2024-09-08: 5 mg via ORAL

## 2024-09-08 MED ORDER — METHYLENE BLUE 20 MG/2ML IV SOSY
PREFILLED_SYRINGE | INTRAVENOUS | Status: AC
  Filled 2024-09-08: qty 2

## 2024-09-08 MED ORDER — 0.9 % SODIUM CHLORIDE (POUR BTL) OPTIME
TOPICAL | Status: DC | PRN
  Administered 2024-09-08 (×2): 1000 mL

## 2024-09-08 MED ORDER — MUPIROCIN 2 % EX OINT
1.0000 | TOPICAL_OINTMENT | Freq: Two times a day (BID) | CUTANEOUS | Status: DC
  Filled 2024-09-08 (×2): qty 22

## 2024-09-08 MED ORDER — CHLORHEXIDINE GLUCONATE CLOTH 2 % EX PADS: 6.0000 | MEDICATED_PAD | Freq: Every day | CUTANEOUS | Status: DC

## 2024-09-08 MED ORDER — MIDAZOLAM HCL (PF) 2 MG/2ML IJ SOLN
INTRAMUSCULAR | Status: DC | PRN
Start: 2024-09-08 — End: 2024-09-08
  Administered 2024-09-08: 2 mg via INTRAVENOUS

## 2024-09-08 MED ORDER — CEFAZOLIN SODIUM-DEXTROSE 3-4 GM/150ML-% IV SOLN
3.0000 g | INTRAVENOUS | Status: AC
  Administered 2024-09-08: 3 g via INTRAVENOUS
  Filled 2024-09-08: qty 150

## 2024-09-08 MED ORDER — OXYCODONE HCL 5 MG PO TABS
ORAL_TABLET | ORAL | Status: AC
  Filled 2024-09-08: qty 1

## 2024-09-08 MED ORDER — BUPIVACAINE LIPOSOME 1.3 % IJ SUSP
INTRAMUSCULAR | Status: AC
  Filled 2024-09-08: qty 20

## 2024-09-08 MED ORDER — BUPIVACAINE-EPINEPHRINE 0.25% -1:200000 IJ SOLN
INTRAMUSCULAR | Status: DC | PRN
  Administered 2024-09-08: 27 mL

## 2024-09-08 MED ORDER — AMISULPRIDE (ANTIEMETIC) 5 MG/2ML IV SOLN: 10.0000 mg | Freq: Once | INTRAVENOUS | Status: DC | PRN

## 2024-09-08 MED ORDER — FENTANYL CITRATE (PF) 250 MCG/5ML IJ SOLN
INTRAMUSCULAR | Status: DC | PRN
  Administered 2024-09-08: 50 ug via INTRAVENOUS
  Administered 2024-09-08 (×2): 100 ug via INTRAVENOUS

## 2024-09-08 MED ORDER — ONDANSETRON HCL 4 MG/2ML IJ SOLN
INTRAMUSCULAR | Status: DC | PRN
Start: 2024-09-08 — End: 2024-09-08
  Administered 2024-09-08: 4 mg via INTRAVENOUS

## 2024-09-08 MED ORDER — HYDROMORPHONE HCL 1 MG/ML IJ SOLN
INTRAMUSCULAR | Status: AC
  Filled 2024-09-08: qty 1

## 2024-09-08 MED ORDER — LACTATED RINGERS IV SOLN: INTRAVENOUS | Status: DC

## 2024-09-08 MED ORDER — BUPIVACAINE LIPOSOME 1.3 % IJ SUSP
INTRAMUSCULAR | Status: DC | PRN
  Administered 2024-09-08: 20 mL

## 2024-09-08 MED ORDER — FENTANYL CITRATE (PF) 250 MCG/5ML IJ SOLN
INTRAMUSCULAR | Status: AC
  Filled 2024-09-08: qty 5

## 2024-09-08 MED ORDER — SURGIFLO WITH THROMBIN (HEMOSTATIC MATRIX KIT) OPTIME
TOPICAL | Status: DC | PRN
  Administered 2024-09-08: 1 via TOPICAL

## 2024-09-08 MED ORDER — DEXMEDETOMIDINE HCL IN NACL 80 MCG/20ML IV SOLN
INTRAVENOUS | Status: DC | PRN
  Administered 2024-09-08 (×2): 12 ug via INTRAVENOUS

## 2024-09-08 MED ORDER — OXYCODONE HCL 5 MG/5ML PO SOLN: 5.0000 mg | Freq: Once | ORAL | Status: AC | PRN

## 2024-09-08 MED ORDER — KETAMINE HCL 50 MG/5ML IJ SOSY
PREFILLED_SYRINGE | INTRAMUSCULAR | Status: AC
  Filled 2024-09-08: qty 5

## 2024-09-08 MED ORDER — BUPIVACAINE-EPINEPHRINE (PF) 0.25% -1:200000 IJ SOLN
INTRAMUSCULAR | Status: AC
Start: 2024-09-08 — End: 2024-09-08
  Filled 2024-09-08: qty 30

## 2024-09-08 MED ORDER — ACETAMINOPHEN 10 MG/ML IV SOLN
1000.0000 mg | Freq: Once | INTRAVENOUS | Status: DC | PRN
  Administered 2024-09-08: 1000 mg via INTRAVENOUS

## 2024-09-08 MED ORDER — SUCCINYLCHOLINE CHLORIDE 200 MG/10ML IV SOSY
PREFILLED_SYRINGE | INTRAVENOUS | Status: DC | PRN
Start: 2024-09-08 — End: 2024-09-08
  Administered 2024-09-08: 160 mg via INTRAVENOUS

## 2024-09-08 MED ORDER — METHYLPREDNISOLONE ACETATE 40 MG/ML IJ SUSP
INTRAMUSCULAR | Status: DC | PRN
  Administered 2024-09-08: 40 mg

## 2024-09-08 MED ORDER — CHLORHEXIDINE GLUCONATE 0.12 % MT SOLN
15.0000 mL | Freq: Once | OROMUCOSAL | Status: AC
  Administered 2024-09-08: 15 mL via OROMUCOSAL
  Filled 2024-09-08: qty 15

## 2024-09-08 MED ORDER — FENTANYL CITRATE (PF) 100 MCG/2ML IJ SOLN
50.0000 ug | Freq: Once | INTRAMUSCULAR | Status: AC
  Administered 2024-09-08: 50 ug via INTRAVENOUS
  Filled 2024-09-08: qty 2

## 2024-09-08 MED ORDER — MIDAZOLAM HCL 2 MG/2ML IJ SOLN
INTRAMUSCULAR | Status: AC
Start: 2024-09-08 — End: 2024-09-08
  Filled 2024-09-08: qty 2

## 2024-09-08 MED ORDER — METHYLPREDNISOLONE ACETATE 40 MG/ML IJ SUSP
INTRAMUSCULAR | Status: AC
  Filled 2024-09-08: qty 1

## 2024-09-08 MED ORDER — PHENYLEPHRINE HCL-NACL 20-0.9 MG/250ML-% IV SOLN
INTRAVENOUS | Status: DC | PRN
  Administered 2024-09-08: 50 ug/min via INTRAVENOUS

## 2024-09-08 MED ORDER — PROPOFOL 10 MG/ML IV BOLUS
INTRAVENOUS | Status: AC
Start: 2024-09-08 — End: 2024-09-08
  Filled 2024-09-08: qty 20

## 2024-09-08 MED ORDER — PROPOFOL 10 MG/ML IV BOLUS
INTRAVENOUS | Status: DC | PRN
Start: 2024-09-08 — End: 2024-09-08
  Administered 2024-09-08 (×2): 50 mg via INTRAVENOUS
  Administered 2024-09-08: 150 mg via INTRAVENOUS
  Administered 2024-09-08: 70 mg via INTRAVENOUS
  Administered 2024-09-08: 10 mg via INTRAVENOUS

## 2024-09-08 MED ORDER — ROCURONIUM BROMIDE 10 MG/ML (PF) SYRINGE
PREFILLED_SYRINGE | INTRAVENOUS | Status: DC | PRN
  Administered 2024-09-08: 50 mg via INTRAVENOUS
  Administered 2024-09-08: 10 mg via INTRAVENOUS
  Administered 2024-09-08: 20 mg via INTRAVENOUS
  Administered 2024-09-08: 10 mg via INTRAVENOUS
  Administered 2024-09-08: 20 mg via INTRAVENOUS

## 2024-09-08 MED ORDER — ORAL CARE MOUTH RINSE: 15.0000 mL | Freq: Once | OROMUCOSAL | Status: AC

## 2024-09-08 MED ORDER — HYDROMORPHONE HCL 1 MG/ML IJ SOLN
0.2500 mg | INTRAMUSCULAR | Status: DC | PRN
  Administered 2024-09-08 (×4): 0.5 mg via INTRAVENOUS

## 2024-09-08 MED ORDER — PHENYLEPHRINE 80 MCG/ML (10ML) SYRINGE FOR IV PUSH (FOR BLOOD PRESSURE SUPPORT)
PREFILLED_SYRINGE | INTRAVENOUS | Status: DC | PRN
Start: 2024-09-08 — End: 2024-09-08
  Administered 2024-09-08: 160 ug via INTRAVENOUS
  Administered 2024-09-08: 80 ug via INTRAVENOUS
  Administered 2024-09-08: 160 ug via INTRAVENOUS
  Administered 2024-09-08 (×2): 80 ug via INTRAVENOUS
  Administered 2024-09-08: 160 ug via INTRAVENOUS
  Administered 2024-09-08: 80 ug via INTRAVENOUS

## 2024-09-08 MED ORDER — KETAMINE HCL 10 MG/ML IJ SOLN
INTRAMUSCULAR | Status: DC | PRN
  Administered 2024-09-08: 50 mg via INTRAVENOUS

## 2024-09-08 MED ORDER — ACETAMINOPHEN 10 MG/ML IV SOLN
INTRAVENOUS | Status: AC
  Filled 2024-09-08: qty 100

## 2024-09-08 MED ORDER — DEXAMETHASONE SOD PHOSPHATE PF 10 MG/ML IJ SOLN
INTRAMUSCULAR | Status: DC | PRN
  Administered 2024-09-08: 5 mg via INTRAVENOUS

## 2024-09-08 MED ORDER — SUGAMMADEX SODIUM 200 MG/2ML IV SOLN
INTRAVENOUS | Status: DC | PRN
  Administered 2024-09-08: 400 mg via INTRAVENOUS

## 2024-09-08 MED ORDER — LIDOCAINE 2% (20 MG/ML) 5 ML SYRINGE
INTRAMUSCULAR | Status: DC | PRN
  Administered 2024-09-08: 100 mg via INTRAVENOUS

## 2024-09-08 MED ORDER — THROMBIN (RECOMBINANT) 20000 UNITS EX SOLR
CUTANEOUS | Status: AC
  Filled 2024-09-08: qty 20000

## 2024-09-08 SURGICAL SUPPLY — 58 items
BAG COUNTER SPONGE SURGICOUNT (BAG) ×1 IMPLANT
BENZOIN TINCTURE PRP APPL 2/3 (GAUZE/BANDAGES/DRESSINGS) IMPLANT
BNDG GAUZE DERMACEA FLUFF 4 (GAUZE/BANDAGES/DRESSINGS) IMPLANT
BUR ROUND PRECISION 4.0 (BURR) ×1 IMPLANT
CABLE BIPOLOR RESECTION CORD (MISCELLANEOUS) ×1 IMPLANT
CANISTER SUCTION 3000ML PPV (SUCTIONS) ×1 IMPLANT
CLSR STERI-STRIP ANTIMIC 1/2X4 (GAUZE/BANDAGES/DRESSINGS) IMPLANT
COVER SURGICAL LIGHT HANDLE (MISCELLANEOUS) ×1 IMPLANT
DRAPE POUCH INSTRU U-SHP 10X18 (DRAPES) ×2 IMPLANT
DRAPE SURG 17X23 STRL (DRAPES) ×4 IMPLANT
DURAPREP 26ML APPLICATOR (WOUND CARE) ×1 IMPLANT
ELECT CAUTERY BLADE 6.4 (BLADE) ×1 IMPLANT
ELECTRODE BLDE 4.0 EZ CLN MEGD (MISCELLANEOUS) IMPLANT
ELECTRODE REM PT RTRN 9FT ADLT (ELECTROSURGICAL) ×1 IMPLANT
EVACUATOR SILICONE 100CC (DRAIN) IMPLANT
FILTER STRAW FLUID ASPIR (MISCELLANEOUS) ×1 IMPLANT
GAUZE 4X4 16PLY ~~LOC~~+RFID DBL (SPONGE) ×2 IMPLANT
GAUZE SPONGE 4X4 12PLY STRL (GAUZE/BANDAGES/DRESSINGS) ×1 IMPLANT
GLOVE BIO SURGEON STRL SZ 6.5 (GLOVE) ×1 IMPLANT
GLOVE BIO SURGEON STRL SZ8 (GLOVE) ×1 IMPLANT
GLOVE BIOGEL PI IND STRL 7.0 (GLOVE) ×1 IMPLANT
GLOVE BIOGEL PI IND STRL 8 (GLOVE) ×1 IMPLANT
GOWN STRL REUS W/ TWL LRG LVL3 (GOWN DISPOSABLE) ×1 IMPLANT
GOWN STRL REUS W/ TWL XL LVL3 (GOWN DISPOSABLE) ×2 IMPLANT
IV CATH 14GX2 1/4 (CATHETERS) ×1 IMPLANT
KIT BASIN OR (CUSTOM PROCEDURE TRAY) ×1 IMPLANT
KIT POSITIONER JACKSON TABLE (MISCELLANEOUS) ×1 IMPLANT
KIT TURNOVER KIT B (KITS) ×1 IMPLANT
NDL 18GX1X1/2 (RX/OR ONLY) (NEEDLE) ×1 IMPLANT
NDL 22X1.5 STRL (OR ONLY) (MISCELLANEOUS) ×1 IMPLANT
NDL HYPO 25GX1X1/2 BEV (NEEDLE) ×1 IMPLANT
NDL SPNL 18GX3.5 QUINCKE PK (NEEDLE) ×2 IMPLANT
NEEDLE 18GX1X1/2 (RX/OR ONLY) (NEEDLE) ×1 IMPLANT
NEEDLE 22X1.5 STRL (OR ONLY) (MISCELLANEOUS) ×1 IMPLANT
NEEDLE HYPO 25GX1X1/2 BEV (NEEDLE) ×1 IMPLANT
NEEDLE SPNL 18GX3.5 QUINCKE PK (NEEDLE) ×2 IMPLANT
PACK LAMINECTOMY ORTHO (CUSTOM PROCEDURE TRAY) ×1 IMPLANT
PACK UNIVERSAL I (CUSTOM PROCEDURE TRAY) ×1 IMPLANT
PAD ARMBOARD POSITIONER FOAM (MISCELLANEOUS) ×2 IMPLANT
PATTIES SURGICAL .5 X.5 (GAUZE/BANDAGES/DRESSINGS) IMPLANT
PATTIES SURGICAL .5 X1 (DISPOSABLE) ×1 IMPLANT
SOLN 0.9% NACL POUR BTL 1000ML (IV SOLUTION) ×1 IMPLANT
SOLN STERILE WATER BTL 1000 ML (IV SOLUTION) ×1 IMPLANT
SPONGE INTESTINAL PEANUT (DISPOSABLE) ×1 IMPLANT
SPONGE SURGIFOAM ABS GEL SZ50 (HEMOSTASIS) ×1 IMPLANT
STRIP CLOSURE SKIN 1/2X4 (GAUZE/BANDAGES/DRESSINGS) IMPLANT
SURGIFLO W/THROMBIN 8M KIT (HEMOSTASIS) IMPLANT
SUT MNCRL AB 4-0 PS2 18 (SUTURE) ×1 IMPLANT
SUT VIC AB 0 CT1 18XCR BRD 8 (SUTURE) IMPLANT
SUT VIC AB 1 CT1 18XCR BRD 8 (SUTURE) ×1 IMPLANT
SUT VIC AB 2-0 CT2 18 VCP726D (SUTURE) ×1 IMPLANT
SYR 20ML LL LF (SYRINGE) IMPLANT
SYR BULB IRRIG 60ML STRL (SYRINGE) ×1 IMPLANT
SYR CONTROL 10ML LL (SYRINGE) ×2 IMPLANT
SYR TB 1ML LUER SLIP (SYRINGE) ×4 IMPLANT
TOWEL GREEN STERILE (TOWEL DISPOSABLE) ×1 IMPLANT
TOWEL GREEN STERILE FF (TOWEL DISPOSABLE) ×1 IMPLANT
YANKAUER SUCT BULB TIP NO VENT (SUCTIONS) ×1 IMPLANT
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
## 2024-09-08 NOTE — Anesthesia Procedure Notes (Addendum)
 Procedure Name: Intubation Date/Time: 09/08/2024 11:27 AM  Performed by: Jolynn Mage, CRNAPre-anesthesia Checklist: Patient identified, Patient being monitored, Timeout performed, Emergency Drugs available and Suction available Patient Re-evaluated:Patient Re-evaluated prior to induction Oxygen Delivery Method: Circle system utilized Preoxygenation: Pre-oxygenation with 100% oxygen Induction Type: IV induction Ventilation: Mask ventilation without difficulty, Two handed mask ventilation required and Oral airway inserted - appropriate to patient size Laryngoscope Size: Mac and 4 Grade View: Grade II Tube type: Oral Tube size: 7.5 mm Number of attempts: 1 Airway Equipment and Method: Stylet Placement Confirmation: ETT inserted through vocal cords under direct vision, positive ETCO2 and breath sounds checked- equal and bilateral Secured at: 23 cm Tube secured with: Tape Dental Injury: Teeth and Oropharynx as per pre-operative assessment

## 2024-09-08 NOTE — Transfer of Care (Signed)
 Immediate Anesthesia Transfer of Care Note  Patient: [name]  Procedure(s) Performed: LUMBAR LAMINECTOMY/DECOMPRESSION MICRODISCECTOMY 1 LEVEL (Back)  Patient Location: PACU  Anesthesia Type:General  Level of Consciousness: awake, alert , patient cooperative, and responds to stimulation  Airway & Oxygen Therapy: Patient Spontanous Breathing and Patient connected to face mask oxygen  Post-op Assessment: Report given to RN, Post -op Vital signs reviewed and stable, and Patient moving all extremities X 4  Post vital signs: Reviewed and stable  Last Vitals:  Vitals Value Taken Time  BP 126/82 09/08/24 14:23  Temp 37 C 09/08/24 14:23  Pulse 90 09/08/24 14:27  Resp 27 09/08/24 14:27  SpO2 86 % 09/08/24 14:27  Vitals shown include unfiled device data.  Last Pain:  Vitals:   09/08/24 1045  TempSrc:   PainSc: 9          Complications: No notable events documented.

## 2024-09-08 NOTE — Telephone Encounter (Signed)
 I check with the pharmacy today. The Rx is ready for pick-up.

## 2024-09-08 NOTE — Progress Notes (Signed)
    Patient underwent uneventful 1 level decompression in the OR. Pt expected to D/C home. He has pain meds through his pain management team, so no scripts will be sent to pharmacy. LSO brace in PACU to be used at all times when upright. F/U in the office in 2 weeks, already scheduled.   Ulises Wolfinger PA-C

## 2024-09-08 NOTE — Anesthesia Preprocedure Evaluation (Addendum)
 Anesthesia Evaluation  Patient identified by MRN, date of birth, ID band Patient awake    Reviewed: Allergy & Precautions, NPO status , Patient's Chart, lab work & pertinent test results  History of Anesthesia Complications Negative for: history of anesthetic complications  Airway Mallampati: II  TM Distance: >3 FB Neck ROM: Full   Comment: Large Beard Dental  (+) Teeth Intact, Dental Advisory Given, Chipped   Pulmonary asthma , Current Smoker   breath sounds clear to auscultation       Cardiovascular hypertension, Pt. on medications  Rhythm:Regular Rate:Normal     Neuro/Psych  PSYCHIATRIC DISORDERS Anxiety Depression       GI/Hepatic ,GERD  ,,  Endo/Other  diabetes (Last A1c of 5.5), Type 2    Renal/GU Renal diseaseHx of RCC     Musculoskeletal  (+) Arthritis ,    Abdominal   Peds  Hematology  (+) Blood dyscrasia, anemia Hgb 14.7, Plts 262K (09/07/24)   Anesthesia Other Findings   Reproductive/Obstetrics                              Anesthesia Physical Anesthesia Plan  ASA: 3  Anesthesia Plan: General   Post-op Pain Management:    Induction: Intravenous  PONV Risk Score and Plan: 1 and Ondansetron , Dexamethasone , Treatment may vary due to age or medical condition and Midazolam   Airway Management Planned: Oral ETT  Additional Equipment:   Intra-op Plan:   Post-operative Plan: Extubation in OR  Informed Consent:      Dental advisory given  Plan Discussed with: CRNA  Anesthesia Plan Comments:          Anesthesia Quick Evaluation

## 2024-09-08 NOTE — Op Note (Signed)
 PATIENT NAME: [name]   MEDICAL RECORD NO.:   [id number]   DATE OF BIRTH: 1975/11/29   DATE OF PROCEDURE: 09/08/2024                               OPERATIVE REPORT   PREOPERATIVE DIAGNOSES: 1. Bilateral leg pain. 2. Neurogenic claudication. 3. Spinal stenosis, L4-5.  POSTOPERATIVE DIAGNOSES: 1. Bilateral leg pain. 2. Neurogenic claudication. 3. Spinal stenosis, L4-5.  PROCEDURE:  L4-5 laminectomy with bilateral partial facetectomy and bilateral foraminotomy.  SURGEON:  Oneil Priestly, MD.  ASSISTANTBETHA Ileana Clara, PA-C.  ANESTHESIA:  General endotracheal anesthesia.  COMPLICATIONS:  None.  DISPOSITION:  Stable.  ESTIMATED BLOOD LOSS:  Minimal.  INDICATIONS FOR SURGERY:  Briefly, [name] is a very pleasant 48- year-old male, who did present to me with pain in the bilateral legs status post a work injury. The patient's MRI did reveal spinal stenosis at L4-5.  We did proceed with appropriate conservative treatment, but the patient did continue to have ongoing pain, which he did feel was limiting his function substantially.  Given the patient's ongoing pain and dysfunction, we did discuss proceeding with the procedure reflected above.  The patient was fully aware of the risks and limitations of surgery and did wish to proceed.  OPERATIVE DETAILS:  On 09/08/2024, the patient was brought to surgery and general endotracheal anesthesia was administered.  The patient was placed prone on a well-padded flat Jackson bed with a jackson frame. Antibiotics were given and the back was prepped and draped in the usual sterile fashion.  A time-out procedure was performed.  I then made a midline incision overlying the L4-5 intervertebral space.  The fascia was incised at the midline.  The paraspinal musculature was bluntly retracted laterally and held retracted with a self-retaining retractor. After confirming the appropriate operative level, I did remove the spinous  process of L4.  At this point, I proceeded with a partial facetectomy on the right and left sides.  Of note, there was very substantial overgrowth of the facet joints bilaterally, and there was also very substantial hypertrophy of the ligamentum flavum.  This was causing severe spinal stenosis.  The lateral recess and neuroforaminal stenosis was addressed by using a high-speed bur in addition to a series of Kerrison punches to thoroughly and decompress the right and left lateral recess and the bilateral neuroforamina.  At the termination of the decompression, I was able to pass a [facility] out the neuroforamen on the right and left sides at the L4-5 level.  I was able to easily pass the [facility] elevator medial to the L5 pedicles as well.  The spinal canal was entirely decompressed.  All bleeding was then adequately controlled.  At this point, 40 mg of Depo-Medrol  was introduced about the epidural space.  Prior to this, the wound was copiously irrigated with a total of approximately 2 L of normal saline. Gelfoam was placed over the laminectomy site.  I was very pleased with the decompression.  There was no extravasation of cerebrospinal fluid noted throughout the entire surgery.  At this point, the wound was closed in layers using #1 Vicryl, followed by 2-0 Vicryl, followed by 4- 0 Monocryl.  Benzoin and Steri-Strips were applied, followed by a sterile dressing.  All instrument counts were correct at the termination of the procedure.  Of note, Ileana Clara, PA-C, was my assistant throughout surgery, and did aid in retraction, suctioning,  and closure from start to finish.   Oneil Priestly, MD

## 2024-09-09 ENCOUNTER — Encounter (HOSPITAL_COMMUNITY): Payer: Self-pay | Admitting: Orthopedic Surgery

## 2024-09-09 MED FILL — Thrombin (Recombinant) For Soln 20000 Unit: CUTANEOUS | Qty: 1 | Status: AC

## 2024-09-09 NOTE — Anesthesia Postprocedure Evaluation (Signed)
 Anesthesia Post Note  Patient: [name]  Procedure(s) Performed: LUMBAR LAMINECTOMY/DECOMPRESSION MICRODISCECTOMY 1 LEVEL (Back)     Patient location during evaluation: PACU Anesthesia Type: General Level of consciousness: awake Pain management: pain level controlled Vital Signs Assessment: post-procedure vital signs reviewed and stable Respiratory status: spontaneous breathing Cardiovascular status: blood pressure returned to baseline Postop Assessment: no apparent nausea or vomiting Anesthetic complications: no   No notable events documented.                 Lauraine KATHEE [place]

## 2024-09-24 ENCOUNTER — Telehealth: Payer: Self-pay

## 2024-09-24 DIAGNOSIS — G8929 Other chronic pain: Secondary | ICD-10-CM

## 2024-09-24 DIAGNOSIS — G894 Chronic pain syndrome: Secondary | ICD-10-CM

## 2024-09-24 MED ORDER — OXYCODONE HCL 10 MG PO TABS: 10.0000 mg | ORAL_TABLET | ORAL | 0 refills | Status: DC | PRN

## 2024-09-24 NOTE — Telephone Encounter (Signed)
 Called and spoke with patient to make aware that the increased amount for the Oxycodone  will be approved for the December refill but, January and moving forward will go back down to the original amount.  Patients phone was losing signal but, patient was able to acknowledge the understanding of refills moving forward from Dr. Lovorn.Patient verbalized understanding.

## 2024-09-24 NOTE — Telephone Encounter (Signed)
 Patient calling into the office requesting a refill on the oxycodone  10 mg.  Patient states that the increase helped with post operative pain and is hoping to get a refill.  Message will be sent to the Provider for review.

## 2024-09-25 ENCOUNTER — Other Ambulatory Visit: Payer: Self-pay | Admitting: Physical Medicine and Rehabilitation

## 2024-10-28 ENCOUNTER — Telehealth: Payer: Self-pay | Admitting: *Deleted

## 2024-10-28 DIAGNOSIS — G894 Chronic pain syndrome: Secondary | ICD-10-CM

## 2024-10-28 DIAGNOSIS — G8929 Other chronic pain: Secondary | ICD-10-CM

## 2024-10-28 MED ORDER — OXYCODONE HCL 10 MG PO TABS: 10.0000 mg | ORAL_TABLET | ORAL | 0 refills | Status: AC | PRN

## 2024-10-28 MED ORDER — FENTANYL 25 MCG/HR TD PT72: 1.0000 | MEDICATED_PATCH | TRANSDERMAL | 0 refills | Status: AC

## 2024-10-28 NOTE — Telephone Encounter (Signed)
 [name] called for a refill on his oxycodone  and fentanyl  patches. He is requesting his fentanyl  be left at 25 mcg. He is going to have another MRI on Monday and possibly have to have surgery again on his back .

## 2024-11-02 NOTE — Progress Notes (Unsigned)
 "  Subjective:    Patient ID: [name], male    DOB: 1976/07/27, 49 y.o.   MRN: [id number]  HPI   Pain Inventory Average Pain {NUMBERS; 0-10:5044} Pain Right Now {NUMBERS; 0-10:5044} My pain is {PAIN DESCRIPTION:21022940}  In the last 24 hours, has pain interfered with the following? General activity {NUMBERS; 0-10:5044} Relation with others {NUMBERS; 0-10:5044} Enjoyment of life {NUMBERS; 0-10:5044} What TIME of day is your pain at its worst? {time of day:24191} Sleep (in general) {BHH GOOD/FAIR/POOR:22877}  Pain is worse with: {ACTIVITIES:21022942} Pain improves with: {PAIN IMPROVES TPUY:78977056} Relief from Meds: {NUMBERS; 0-10:5044}  Family History  Problem Relation Age of Onset   Alcohol abuse Mother    Cirrhosis Mother    Cirrhosis Father    Colitis Father    Heart disease Father    Asthma Father    Other Father        Chemical Imbalance   Colon polyps Sister        intestinal problems   Other Brother        Intestinal Fissure   Diabetes Maternal Grandfather    Prostate cancer Paternal Grandfather    Asthma Son    Heart attack Other        Paternal Grandparents   Stroke Other        Paternal Grandparents   Social History   Socioeconomic History   Marital status: Divorced    Spouse name: Not on file   Number of children: 3   Years of education: Not on file   Highest education level: Not on file  Occupational History   Not on file  Tobacco Use   Smoking status: Every Day    Current packs/day: 1.00    Average packs/day: 1 pack/day for 24.0 years (24.0 ttl pk-yrs)    Types: Cigarettes   Smokeless tobacco: Current  Vaping Use   Vaping status: Every Day  Substance and Sexual Activity   Alcohol use: Not Currently    Alcohol/week: 0.0 standard drinks of alcohol   Drug use: No   Sexual activity: Not on file  Other Topics Concern   Not on file  Social History Narrative   ** Merged History Encounter **          Right handed    Lives on 2nd  floor   Social Drivers of Health   Tobacco Use: High Risk (09/08/2024)   Patient History    Smoking Tobacco Use: Every Day    Smokeless Tobacco Use: Current    Passive Exposure: Not on file  Financial Resource Strain: Not on file  Food Insecurity: No Food Insecurity (05/19/2024)   Epic    Worried About Programme Researcher, Broadcasting/film/video in the Last Year: Never true    Ran Out of Food in the Last Year: Never true  Transportation Needs: No Transportation Needs (05/19/2024)   Epic    Lack of Transportation (Medical): No    Lack of Transportation (Non-Medical): No  Physical Activity: Not on file  Stress: Not on file  Social Connections: Socially Isolated (05/19/2024)   Social Connection and Isolation Panel    Frequency of Communication with Friends and Family: Twice a week    Frequency of Social Gatherings with Friends and Family: Twice a week    Attends Religious Services: Never    Database Administrator or Organizations: No    Attends Banker Meetings: Never    Marital Status: Divorced  Depression (PHQ2-9): Medium Risk (09/03/2024)  Depression (PHQ2-9)    PHQ-2 Score: 6  Alcohol Screen: Not on file  Housing: Low Risk (05/20/2024)   Epic    Unable to Pay for Housing in the Last Year: No    Number of Times Moved in the Last Year: 0    Homeless in the Last Year: No  Utilities: Not At Risk (05/19/2024)   Epic    Threatened with loss of utilities: No  Health Literacy: Not on file   Past Surgical History:  Procedure Laterality Date   ABLATION     Lumbar   COLONOSCOPY WITH PROPOFOL  N/A 08/19/2013   Procedure: COLONOSCOPY WITH PROPOFOL ;  Surgeon: Toribio SHAUNNA Cedar, MD;  Location: [facility];  Service: Endoscopy;  Laterality: N/A;   ESOPHAGOGASTRODUODENOSCOPY (EGD) WITH PROPOFOL  N/A 08/19/2013   Procedure: ESOPHAGOGASTRODUODENOSCOPY (EGD) WITH PROPOFOL ;  Surgeon: Toribio SHAUNNA Cedar, MD;  Location: [facility];  Service: Endoscopy;  Laterality: N/A;   INTUBATION-ENDOTRACHEAL WITH  TRACHEOSTOMY STANDBY N/A 05/07/2023   Procedure: INTUBATION-ENDOTRACHEAL WITH TRACHEOSTOMY STANDBY;  Surgeon: Paola Dreama SAILOR, MD;  Location: [facility];  Service: General;  Laterality: N/A;   IR RADIOLOGIST EVAL & MGMT  05/20/2019   KNEE ARTHROSCOPY Left 07/13/2020   Procedure: left knee arthroscopy, debridement, cyst decompression;  Surgeon: Addie Cordella Hamilton, MD;  Location: [facility];  Service: Orthopedics;  Laterality: Left;   LUMBAR LAMINECTOMY/DECOMPRESSION MICRODISCECTOMY N/A 09/08/2024   Procedure: LUMBAR LAMINECTOMY/DECOMPRESSION MICRODISCECTOMY 1 LEVEL;  Surgeon: Beuford Anes, MD;  Location: [facility];  Service: Orthopedics;  Laterality: N/A;  LUMBAR 4- LUMBAR 5 DECOMPRESSION   ORIF MANDIBULAR FRACTURE N/A 02/16/2016   Procedure: OPEN REDUCTION INTERNAL FIXATION (ORIF) MANDIBULAR FRACTURE;  Surgeon: Norleen Notice, MD;  Location: [facility];  Service: ENT;  Laterality: N/A;   RIB PLATING Left 02/20/2016   Procedure: LEFT RIB PLATING;  Surgeon: Maude Fleeta Ochoa, MD;  Location: [facility] OR;  Service: Thoracic;  Laterality: Left;   ROBOTIC ASSITED PARTIAL NEPHRECTOMY Left 06/17/2013   Procedure: ROBOTIC ASSITED PARTIAL NEPHRECTOMY;  Surgeon: Noretta Ferrara, MD;  Location: [facility];  Service: Urology;  Laterality: Left;   TRACHEOSTOMY TUBE PLACEMENT N/A 02/16/2016   Procedure: TRACHEOSTOMY;  Surgeon: Norleen Notice, MD;  Location: [facility] OR;  Service: ENT;  Laterality: N/A;   VASECTOMY  10/21/2010   WISDOM TOOTH EXTRACTION  middle school   WRIST SURGERY Left middle school   arteries and nerves tangled up   Past Surgical History:  Procedure Laterality Date   ABLATION     Lumbar   COLONOSCOPY WITH PROPOFOL  N/A 08/19/2013   Procedure: COLONOSCOPY WITH PROPOFOL ;  Surgeon: Toribio SHAUNNA Cedar, MD;  Location: [facility];  Service: Endoscopy;  Laterality: N/A;   ESOPHAGOGASTRODUODENOSCOPY (EGD) WITH PROPOFOL  N/A 08/19/2013   Procedure: ESOPHAGOGASTRODUODENOSCOPY (EGD) WITH PROPOFOL ;  Surgeon: Toribio SHAUNNA Cedar, MD;  Location: [facility];  Service: Endoscopy;  Laterality: N/A;   INTUBATION-ENDOTRACHEAL WITH TRACHEOSTOMY STANDBY N/A 05/07/2023   Procedure: INTUBATION-ENDOTRACHEAL WITH TRACHEOSTOMY STANDBY;  Surgeon: Paola Dreama SAILOR, MD;  Location: [facility];  Service: General;  Laterality: N/A;   IR RADIOLOGIST EVAL & MGMT  05/20/2019   KNEE ARTHROSCOPY Left 07/13/2020   Procedure: left knee arthroscopy, debridement, cyst decompression;  Surgeon: Addie Cordella Hamilton, MD;  Location: [facility];  Service: Orthopedics;  Laterality: Left;   LUMBAR LAMINECTOMY/DECOMPRESSION MICRODISCECTOMY N/A 09/08/2024   Procedure: LUMBAR LAMINECTOMY/DECOMPRESSION MICRODISCECTOMY 1 LEVEL;  Surgeon: Beuford Anes, MD;  Location: [facility];  Service: Orthopedics;  Laterality: N/A;  LUMBAR 4- LUMBAR 5 DECOMPRESSION   ORIF  MANDIBULAR FRACTURE N/A 02/16/2016   Procedure: OPEN REDUCTION INTERNAL FIXATION (ORIF) MANDIBULAR FRACTURE;  Surgeon: Norleen Notice, MD;  Location: [facility] OR;  Service: ENT;  Laterality: N/A;   RIB PLATING Left 02/20/2016   Procedure: LEFT RIB PLATING;  Surgeon: Maude Fleeta Ochoa, MD;  Location: [facility] OR;  Service: Thoracic;  Laterality: Left;   ROBOTIC ASSITED PARTIAL NEPHRECTOMY Left 06/17/2013   Procedure: ROBOTIC ASSITED PARTIAL NEPHRECTOMY;  Surgeon: Noretta Ferrara, MD;  Location: [facility];  Service: Urology;  Laterality: Left;   TRACHEOSTOMY TUBE PLACEMENT N/A 02/16/2016   Procedure: TRACHEOSTOMY;  Surgeon: Norleen Notice, MD;  Location: [facility] OR;  Service: ENT;  Laterality: N/A;   VASECTOMY  10/21/2010   WISDOM TOOTH EXTRACTION  middle school   WRIST SURGERY Left middle school   arteries and nerves tangled up   Past Medical History:  Diagnosis Date   Anxiety and depression 08/01/2013   Arthritis    Asthma    Cellulitis    left leg and stomach   Chicken pox    Chronic pain    Diarrhea 08/01/2013   History of kidney cancer    Hx of vasectomy    Hypertension    Post traumatic stress disorder  (PTSD)    Renal cell carcinoma (HCC) 06/01/2013   There were no vitals taken for this visit.  Opioid Risk Score:   Fall Risk Score:  `1  Depression screen PHQ 2/9     09/03/2024    2:19 PM 06/29/2024   11:59 AM 12/25/2023   11:14 AM 10/13/2023    9:43 AM 06/04/2023    1:20 PM 09/16/2022    1:51 PM 06/28/2022    2:49 PM  Depression screen PHQ 2/9  Decreased Interest 3 0 1 0 1 0 0  Down, Depressed, Hopeless 3 0 1 0 1 0 0  PHQ - 2 Score 6 0 2 0 2 0 0    Review of Systems     Objective:   Physical Exam        Assessment & Plan:    "

## 2024-11-03 ENCOUNTER — Encounter: Attending: Registered Nurse | Admitting: Registered Nurse

## 2024-11-10 ENCOUNTER — Telehealth: Payer: Self-pay

## 2024-11-10 NOTE — Telephone Encounter (Signed)
 Patient called. He needs a PA for his medications.

## 2024-11-10 NOTE — Telephone Encounter (Signed)
 Attempt to get PA on Fentanyl  Patch but members insurance has termed.  Called and left message for patient to call office.  We need new insurance information before proceeding with submitting a PA for medication again.  Edge Mauger (Key: [id number]) Need Help? Call us  at [phone] Status New (Not sent to plan) Drug fentaNYL  25MCG/HR 72 hr patches ePA cloud logo Form [employer] [place]  Medicaid Electronic PA Form [phone] NCPDP) There was an error with your request The patient is not found with the information provided - Member Termed

## 2024-11-12 ENCOUNTER — Telehealth: Payer: Self-pay

## 2024-11-12 NOTE — Telephone Encounter (Signed)
 2nd call attempt to patient.  No answer, left detailed message to make patient aware that we need new health insurance information to submit a prior authorization request for pain medication.  Advised on voice message that we cannot submit a prior authorization for insurance to cover medication if we do not have updated health insurance information and patient needs to call office or upload information on mychart app.

## 2024-11-19 NOTE — Telephone Encounter (Signed)
 Per pharmacy he does not need a prior authorization.   Please call the insurance plan for an over-ride. [name] has exceeded his Morphine  MME which is 200 MME for the plan.  [phone]. Please let the pharmacy know after the call has been completed.

## 2024-11-19 NOTE — Telephone Encounter (Signed)
 The pharmacy is sending the Rx claim to: [facility]  Phone [phone]  ID# [id number] Group# [id number] BIN - [id number]

## 2024-11-26 ENCOUNTER — Other Ambulatory Visit: Payer: Self-pay | Admitting: Physical Medicine and Rehabilitation

## 2025-01-03 ENCOUNTER — Encounter: Admitting: Physical Medicine and Rehabilitation
